# Patient Record
Sex: Female | Born: 1975 | Race: Black or African American | Hispanic: No | Smoking: Never smoker
Health system: Southern US, Community
[De-identification: ages and names within clinical notes are randomized; demographics above are authoritative.]

## PROBLEM LIST (undated history)

## (undated) DIAGNOSIS — D649 Anemia, unspecified: Secondary | ICD-10-CM

## (undated) DIAGNOSIS — Z87410 Personal history of cervical dysplasia: Secondary | ICD-10-CM

## (undated) DIAGNOSIS — E162 Hypoglycemia, unspecified: Secondary | ICD-10-CM

## (undated) DIAGNOSIS — K219 Gastro-esophageal reflux disease without esophagitis: Secondary | ICD-10-CM

## (undated) DIAGNOSIS — F319 Bipolar disorder, unspecified: Secondary | ICD-10-CM

## (undated) DIAGNOSIS — G4700 Insomnia, unspecified: Secondary | ICD-10-CM

## (undated) DIAGNOSIS — F419 Anxiety disorder, unspecified: Secondary | ICD-10-CM

## (undated) DIAGNOSIS — R87629 Unspecified abnormal cytological findings in specimens from vagina: Secondary | ICD-10-CM

## (undated) DIAGNOSIS — N6012 Diffuse cystic mastopathy of left breast: Secondary | ICD-10-CM

## (undated) DIAGNOSIS — F32A Depression, unspecified: Secondary | ICD-10-CM

## (undated) DIAGNOSIS — N6011 Diffuse cystic mastopathy of right breast: Secondary | ICD-10-CM

## (undated) DIAGNOSIS — F329 Major depressive disorder, single episode, unspecified: Secondary | ICD-10-CM

## (undated) DIAGNOSIS — U071 COVID-19: Secondary | ICD-10-CM

## (undated) HISTORY — DX: Anxiety disorder, unspecified: F41.9

## (undated) HISTORY — DX: Personal history of cervical dysplasia: Z87.410

## (undated) HISTORY — PX: AUGMENTATION MAMMAPLASTY: SUR837

## (undated) HISTORY — DX: Depression, unspecified: F32.A

## (undated) HISTORY — DX: Unspecified abnormal cytological findings in specimens from vagina: R87.629

## (undated) HISTORY — PX: BREAST FIBROADENOMA SURGERY: SHX580

## (undated) HISTORY — DX: Major depressive disorder, single episode, unspecified: F32.9

## (undated) HISTORY — DX: Anemia, unspecified: D64.9

---
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
## 1997-10-11 ENCOUNTER — Emergency Department (HOSPITAL_COMMUNITY): Admission: EM | Admit: 1997-10-11 | Discharge: 1997-10-11 | Payer: Self-pay | Admitting: Internal Medicine

## 1997-10-12 ENCOUNTER — Emergency Department (HOSPITAL_COMMUNITY): Admission: EM | Admit: 1997-10-12 | Discharge: 1997-10-12 | Payer: Self-pay | Admitting: Emergency Medicine

## 1997-10-18 ENCOUNTER — Emergency Department (HOSPITAL_COMMUNITY): Admission: EM | Admit: 1997-10-18 | Discharge: 1997-10-18 | Payer: Self-pay | Admitting: Emergency Medicine

## 1998-07-31 ENCOUNTER — Emergency Department (HOSPITAL_COMMUNITY): Admission: EM | Admit: 1998-07-31 | Discharge: 1998-07-31 | Payer: Self-pay

## 1998-08-16 ENCOUNTER — Inpatient Hospital Stay (HOSPITAL_COMMUNITY): Admission: AD | Admit: 1998-08-16 | Discharge: 1998-08-16 | Payer: Self-pay | Admitting: Obstetrics

## 1998-08-16 ENCOUNTER — Encounter: Payer: Self-pay | Admitting: Obstetrics

## 1998-08-31 ENCOUNTER — Encounter: Admission: RE | Admit: 1998-08-31 | Discharge: 1998-08-31 | Payer: Self-pay | Admitting: Obstetrics

## 1998-09-06 ENCOUNTER — Encounter: Payer: Self-pay | Admitting: Obstetrics

## 1998-09-07 ENCOUNTER — Inpatient Hospital Stay (HOSPITAL_COMMUNITY): Admission: AD | Admit: 1998-09-07 | Discharge: 1998-09-08 | Payer: Self-pay | Admitting: Obstetrics

## 1998-09-12 ENCOUNTER — Inpatient Hospital Stay (HOSPITAL_COMMUNITY): Admission: AD | Admit: 1998-09-12 | Discharge: 1998-09-12 | Payer: Self-pay | Admitting: *Deleted

## 1998-09-26 ENCOUNTER — Encounter: Admission: RE | Admit: 1998-09-26 | Discharge: 1998-09-26 | Payer: Self-pay | Admitting: Sports Medicine

## 1998-10-03 ENCOUNTER — Inpatient Hospital Stay (HOSPITAL_COMMUNITY): Admission: AD | Admit: 1998-10-03 | Discharge: 1998-10-03 | Payer: Self-pay | Admitting: *Deleted

## 1998-10-03 ENCOUNTER — Encounter: Payer: Self-pay | Admitting: *Deleted

## 1998-10-09 ENCOUNTER — Encounter: Admission: RE | Admit: 1998-10-09 | Discharge: 1998-10-09 | Payer: Self-pay | Admitting: Family Medicine

## 1998-10-12 ENCOUNTER — Inpatient Hospital Stay (HOSPITAL_COMMUNITY): Admission: AD | Admit: 1998-10-12 | Discharge: 1998-10-12 | Payer: Self-pay | Admitting: Obstetrics

## 1998-10-13 ENCOUNTER — Encounter: Admission: RE | Admit: 1998-10-13 | Discharge: 1998-10-13 | Payer: Self-pay | Admitting: Family Medicine

## 1998-11-02 ENCOUNTER — Inpatient Hospital Stay (HOSPITAL_COMMUNITY): Admission: AD | Admit: 1998-11-02 | Discharge: 1998-11-02 | Payer: Self-pay | Admitting: *Deleted

## 1998-11-07 ENCOUNTER — Observation Stay (HOSPITAL_COMMUNITY): Admission: AD | Admit: 1998-11-07 | Discharge: 1998-11-08 | Payer: Self-pay | Admitting: *Deleted

## 1998-11-13 ENCOUNTER — Encounter: Admission: RE | Admit: 1998-11-13 | Discharge: 1998-11-13 | Payer: Self-pay | Admitting: Family Medicine

## 1998-11-17 ENCOUNTER — Encounter: Admission: RE | Admit: 1998-11-17 | Discharge: 1998-11-17 | Payer: Self-pay | Admitting: Family Medicine

## 1998-11-27 ENCOUNTER — Encounter: Admission: RE | Admit: 1998-11-27 | Discharge: 1998-11-27 | Payer: Self-pay | Admitting: Family Medicine

## 1998-12-20 ENCOUNTER — Encounter: Admission: RE | Admit: 1998-12-20 | Discharge: 1998-12-20 | Payer: Self-pay | Admitting: Family Medicine

## 1998-12-27 ENCOUNTER — Other Ambulatory Visit: Admission: RE | Admit: 1998-12-27 | Discharge: 1998-12-27 | Payer: Self-pay | Admitting: *Deleted

## 1998-12-27 ENCOUNTER — Encounter: Admission: RE | Admit: 1998-12-27 | Discharge: 1998-12-27 | Payer: Self-pay | Admitting: Family Medicine

## 1999-01-26 ENCOUNTER — Encounter: Admission: RE | Admit: 1999-01-26 | Discharge: 1999-01-26 | Payer: Self-pay | Admitting: Family Medicine

## 1999-02-16 ENCOUNTER — Ambulatory Visit (HOSPITAL_COMMUNITY): Admission: RE | Admit: 1999-02-16 | Discharge: 1999-02-16 | Payer: Self-pay | Admitting: Obstetrics

## 1999-02-22 ENCOUNTER — Encounter: Admission: RE | Admit: 1999-02-22 | Discharge: 1999-02-22 | Payer: Self-pay | Admitting: Family Medicine

## 1999-02-26 ENCOUNTER — Encounter: Admission: RE | Admit: 1999-02-26 | Discharge: 1999-02-26 | Payer: Self-pay | Admitting: Family Medicine

## 1999-03-01 ENCOUNTER — Encounter: Admission: RE | Admit: 1999-03-01 | Discharge: 1999-03-01 | Payer: Self-pay | Admitting: Family Medicine

## 1999-03-14 ENCOUNTER — Encounter: Admission: RE | Admit: 1999-03-14 | Discharge: 1999-03-14 | Payer: Self-pay | Admitting: Family Medicine

## 1999-03-20 ENCOUNTER — Inpatient Hospital Stay (HOSPITAL_COMMUNITY): Admission: AD | Admit: 1999-03-20 | Discharge: 1999-03-20 | Payer: Self-pay | Admitting: *Deleted

## 1999-03-27 ENCOUNTER — Encounter: Admission: RE | Admit: 1999-03-27 | Discharge: 1999-03-27 | Payer: Self-pay | Admitting: Family Medicine

## 1999-04-03 ENCOUNTER — Encounter: Admission: RE | Admit: 1999-04-03 | Discharge: 1999-04-03 | Payer: Self-pay | Admitting: Sports Medicine

## 1999-04-13 ENCOUNTER — Encounter: Admission: RE | Admit: 1999-04-13 | Discharge: 1999-04-13 | Payer: Self-pay | Admitting: Sports Medicine

## 1999-04-18 ENCOUNTER — Encounter: Admission: RE | Admit: 1999-04-18 | Discharge: 1999-04-18 | Payer: Self-pay | Admitting: Family Medicine

## 1999-04-21 ENCOUNTER — Inpatient Hospital Stay (HOSPITAL_COMMUNITY): Admission: AD | Admit: 1999-04-21 | Discharge: 1999-04-21 | Payer: Self-pay | Admitting: *Deleted

## 1999-04-23 DIAGNOSIS — E162 Hypoglycemia, unspecified: Secondary | ICD-10-CM

## 1999-04-23 HISTORY — DX: Hypoglycemia, unspecified: E16.2

## 1999-04-26 ENCOUNTER — Inpatient Hospital Stay (HOSPITAL_COMMUNITY): Admission: AD | Admit: 1999-04-26 | Discharge: 1999-04-26 | Payer: Self-pay | Admitting: Obstetrics & Gynecology

## 1999-04-26 ENCOUNTER — Encounter: Payer: Self-pay | Admitting: Obstetrics & Gynecology

## 1999-04-26 ENCOUNTER — Encounter: Admission: RE | Admit: 1999-04-26 | Discharge: 1999-04-26 | Payer: Self-pay | Admitting: Family Medicine

## 1999-04-29 ENCOUNTER — Inpatient Hospital Stay (HOSPITAL_COMMUNITY): Admission: AD | Admit: 1999-04-29 | Discharge: 1999-04-29 | Payer: Self-pay | Admitting: Obstetrics

## 1999-05-02 ENCOUNTER — Inpatient Hospital Stay (HOSPITAL_COMMUNITY): Admission: AD | Admit: 1999-05-02 | Discharge: 1999-05-02 | Payer: Self-pay | Admitting: Obstetrics

## 1999-05-03 ENCOUNTER — Encounter: Admission: RE | Admit: 1999-05-03 | Discharge: 1999-05-03 | Payer: Self-pay | Admitting: Family Medicine

## 1999-05-05 ENCOUNTER — Encounter (INDEPENDENT_AMBULATORY_CARE_PROVIDER_SITE_OTHER): Payer: Self-pay | Admitting: Specialist

## 1999-05-05 ENCOUNTER — Encounter: Payer: Self-pay | Admitting: *Deleted

## 1999-05-05 ENCOUNTER — Inpatient Hospital Stay (HOSPITAL_COMMUNITY): Admission: AD | Admit: 1999-05-05 | Discharge: 1999-05-07 | Payer: Self-pay | Admitting: *Deleted

## 1999-05-07 ENCOUNTER — Encounter: Admission: RE | Admit: 1999-05-07 | Discharge: 1999-05-07 | Payer: Self-pay | Admitting: Family Medicine

## 1999-05-08 ENCOUNTER — Encounter: Admission: RE | Admit: 1999-05-08 | Discharge: 1999-08-06 | Payer: Self-pay | Admitting: *Deleted

## 1999-06-13 ENCOUNTER — Encounter: Admission: RE | Admit: 1999-06-13 | Discharge: 1999-06-13 | Payer: Self-pay | Admitting: Family Medicine

## 1999-08-03 ENCOUNTER — Encounter: Admission: RE | Admit: 1999-08-03 | Discharge: 1999-11-01 | Payer: Self-pay | Admitting: *Deleted

## 1999-08-29 ENCOUNTER — Encounter: Admission: RE | Admit: 1999-08-29 | Discharge: 1999-08-29 | Payer: Self-pay | Admitting: Family Medicine

## 1999-12-27 ENCOUNTER — Encounter: Admission: RE | Admit: 1999-12-27 | Discharge: 1999-12-27 | Payer: Self-pay | Admitting: Family Medicine

## 2000-03-11 ENCOUNTER — Encounter: Admission: RE | Admit: 2000-03-11 | Discharge: 2000-03-11 | Payer: Self-pay | Admitting: Sports Medicine

## 2000-05-13 ENCOUNTER — Encounter: Admission: RE | Admit: 2000-05-13 | Discharge: 2000-05-13 | Payer: Self-pay | Admitting: Family Medicine

## 2000-05-13 ENCOUNTER — Other Ambulatory Visit: Admission: RE | Admit: 2000-05-13 | Discharge: 2000-05-13 | Payer: Self-pay | Admitting: Family Medicine

## 2000-05-19 ENCOUNTER — Encounter: Admission: RE | Admit: 2000-05-19 | Discharge: 2000-05-19 | Payer: Self-pay | Admitting: Family Medicine

## 2000-07-16 ENCOUNTER — Encounter: Admission: RE | Admit: 2000-07-16 | Discharge: 2000-07-16 | Payer: Self-pay | Admitting: Family Medicine

## 2000-08-08 ENCOUNTER — Encounter: Admission: RE | Admit: 2000-08-08 | Discharge: 2000-08-08 | Payer: Self-pay | Admitting: Family Medicine

## 2000-08-15 ENCOUNTER — Encounter: Admission: RE | Admit: 2000-08-15 | Discharge: 2000-08-15 | Payer: Self-pay | Admitting: Family Medicine

## 2001-01-01 ENCOUNTER — Emergency Department (HOSPITAL_COMMUNITY): Admission: EM | Admit: 2001-01-01 | Discharge: 2001-01-01 | Payer: Self-pay | Admitting: Emergency Medicine

## 2001-01-01 ENCOUNTER — Encounter: Payer: Self-pay | Admitting: Emergency Medicine

## 2001-04-04 ENCOUNTER — Emergency Department (HOSPITAL_COMMUNITY): Admission: EM | Admit: 2001-04-04 | Discharge: 2001-04-04 | Payer: Self-pay | Admitting: Emergency Medicine

## 2001-04-04 ENCOUNTER — Encounter: Payer: Self-pay | Admitting: Emergency Medicine

## 2001-04-22 HISTORY — PX: TUBAL LIGATION: SHX77

## 2001-04-29 ENCOUNTER — Encounter: Admission: RE | Admit: 2001-04-29 | Discharge: 2001-04-29 | Payer: Self-pay | Admitting: Family Medicine

## 2001-06-11 ENCOUNTER — Other Ambulatory Visit: Admission: RE | Admit: 2001-06-11 | Discharge: 2001-06-11 | Payer: Self-pay | Admitting: Family Medicine

## 2001-06-11 ENCOUNTER — Encounter: Admission: RE | Admit: 2001-06-11 | Discharge: 2001-06-11 | Payer: Self-pay | Admitting: Family Medicine

## 2001-06-16 ENCOUNTER — Encounter: Admission: RE | Admit: 2001-06-16 | Discharge: 2001-06-16 | Payer: Self-pay | Admitting: Sports Medicine

## 2001-06-16 ENCOUNTER — Encounter: Payer: Self-pay | Admitting: Sports Medicine

## 2001-07-28 ENCOUNTER — Encounter: Admission: RE | Admit: 2001-07-28 | Discharge: 2001-07-28 | Payer: Self-pay | Admitting: Family Medicine

## 2001-08-25 ENCOUNTER — Encounter: Admission: RE | Admit: 2001-08-25 | Discharge: 2001-08-25 | Payer: Self-pay | Admitting: Family Medicine

## 2001-09-28 ENCOUNTER — Inpatient Hospital Stay (HOSPITAL_COMMUNITY): Admission: AD | Admit: 2001-09-28 | Discharge: 2001-09-28 | Payer: Self-pay | Admitting: Obstetrics and Gynecology

## 2001-11-19 ENCOUNTER — Encounter: Admission: RE | Admit: 2001-11-19 | Discharge: 2001-11-19 | Payer: Self-pay | Admitting: Family Medicine

## 2002-06-14 ENCOUNTER — Inpatient Hospital Stay (HOSPITAL_COMMUNITY): Admission: AD | Admit: 2002-06-14 | Discharge: 2002-06-14 | Payer: Self-pay | Admitting: Obstetrics and Gynecology

## 2002-08-13 ENCOUNTER — Other Ambulatory Visit: Admission: RE | Admit: 2002-08-13 | Discharge: 2002-08-13 | Payer: Self-pay | Admitting: Family Medicine

## 2002-08-13 ENCOUNTER — Encounter: Admission: RE | Admit: 2002-08-13 | Discharge: 2002-08-13 | Payer: Self-pay | Admitting: Family Medicine

## 2002-09-10 ENCOUNTER — Encounter: Admission: RE | Admit: 2002-09-10 | Discharge: 2002-09-10 | Payer: Self-pay | Admitting: Family Medicine

## 2002-12-23 ENCOUNTER — Encounter: Payer: Self-pay | Admitting: Emergency Medicine

## 2002-12-23 ENCOUNTER — Emergency Department (HOSPITAL_COMMUNITY): Admission: EM | Admit: 2002-12-23 | Discharge: 2002-12-24 | Payer: Self-pay | Admitting: *Deleted

## 2003-01-21 ENCOUNTER — Encounter: Admission: RE | Admit: 2003-01-21 | Discharge: 2003-01-21 | Payer: Self-pay | Admitting: Sports Medicine

## 2003-01-21 ENCOUNTER — Ambulatory Visit (HOSPITAL_COMMUNITY): Admission: RE | Admit: 2003-01-21 | Discharge: 2003-01-21 | Payer: Self-pay | Admitting: Sports Medicine

## 2003-02-01 ENCOUNTER — Encounter: Admission: RE | Admit: 2003-02-01 | Discharge: 2003-02-01 | Payer: Self-pay | Admitting: Family Medicine

## 2003-02-07 ENCOUNTER — Encounter: Admission: RE | Admit: 2003-02-07 | Discharge: 2003-02-07 | Payer: Self-pay | Admitting: Family Medicine

## 2003-10-25 ENCOUNTER — Encounter (INDEPENDENT_AMBULATORY_CARE_PROVIDER_SITE_OTHER): Payer: Self-pay | Admitting: *Deleted

## 2003-10-25 LAB — CONVERTED CEMR LAB

## 2003-11-04 ENCOUNTER — Other Ambulatory Visit: Admission: RE | Admit: 2003-11-04 | Discharge: 2003-11-04 | Payer: Self-pay | Admitting: Family Medicine

## 2003-11-04 ENCOUNTER — Encounter: Admission: RE | Admit: 2003-11-04 | Discharge: 2003-11-04 | Payer: Self-pay | Admitting: Family Medicine

## 2003-11-08 ENCOUNTER — Encounter: Admission: RE | Admit: 2003-11-08 | Discharge: 2003-11-08 | Payer: Self-pay | Admitting: Sports Medicine

## 2003-12-05 ENCOUNTER — Encounter: Admission: RE | Admit: 2003-12-05 | Discharge: 2003-12-05 | Payer: Self-pay | Admitting: Sports Medicine

## 2004-05-07 ENCOUNTER — Ambulatory Visit: Payer: Self-pay | Admitting: Family Medicine

## 2004-08-07 ENCOUNTER — Ambulatory Visit: Payer: Self-pay | Admitting: Family Medicine

## 2004-09-18 ENCOUNTER — Emergency Department (HOSPITAL_COMMUNITY): Admission: EM | Admit: 2004-09-18 | Discharge: 2004-09-18 | Payer: Self-pay | Admitting: Family Medicine

## 2005-01-21 ENCOUNTER — Emergency Department (HOSPITAL_COMMUNITY): Admission: EM | Admit: 2005-01-21 | Discharge: 2005-01-21 | Payer: Self-pay | Admitting: Family Medicine

## 2005-05-24 ENCOUNTER — Ambulatory Visit: Payer: Self-pay | Admitting: Family Medicine

## 2005-06-18 ENCOUNTER — Ambulatory Visit: Payer: Self-pay | Admitting: Family Medicine

## 2005-06-18 ENCOUNTER — Other Ambulatory Visit: Admission: RE | Admit: 2005-06-18 | Discharge: 2005-06-18 | Payer: Self-pay | Admitting: Family Medicine

## 2005-08-27 ENCOUNTER — Emergency Department (HOSPITAL_COMMUNITY): Admission: EM | Admit: 2005-08-27 | Discharge: 2005-08-28 | Payer: Self-pay | Admitting: Emergency Medicine

## 2005-09-04 ENCOUNTER — Encounter: Admission: RE | Admit: 2005-09-04 | Discharge: 2005-09-20 | Payer: Self-pay | Admitting: Specialist

## 2006-06-20 ENCOUNTER — Encounter (INDEPENDENT_AMBULATORY_CARE_PROVIDER_SITE_OTHER): Payer: Self-pay | Admitting: *Deleted

## 2006-12-08 ENCOUNTER — Emergency Department (HOSPITAL_COMMUNITY): Admission: EM | Admit: 2006-12-08 | Discharge: 2006-12-08 | Payer: Self-pay | Admitting: Family Medicine

## 2007-10-07 ENCOUNTER — Ambulatory Visit: Payer: Self-pay | Admitting: Family Medicine

## 2007-10-07 ENCOUNTER — Encounter (INDEPENDENT_AMBULATORY_CARE_PROVIDER_SITE_OTHER): Payer: Self-pay | Admitting: Family Medicine

## 2007-10-07 DIAGNOSIS — Z862 Personal history of diseases of the blood and blood-forming organs and certain disorders involving the immune mechanism: Secondary | ICD-10-CM | POA: Insufficient documentation

## 2007-10-07 DIAGNOSIS — E162 Hypoglycemia, unspecified: Secondary | ICD-10-CM | POA: Insufficient documentation

## 2007-10-08 ENCOUNTER — Encounter (INDEPENDENT_AMBULATORY_CARE_PROVIDER_SITE_OTHER): Payer: Self-pay | Admitting: Family Medicine

## 2007-10-08 DIAGNOSIS — E079 Disorder of thyroid, unspecified: Secondary | ICD-10-CM | POA: Insufficient documentation

## 2007-10-08 LAB — CONVERTED CEMR LAB
Albumin: 5.1 g/dL (ref 3.5–5.2)
Alkaline Phosphatase: 40 units/L (ref 39–117)
CO2: 22 meq/L (ref 19–32)
Chlamydia, DNA Probe: NEGATIVE
Chloride: 103 meq/L (ref 96–112)
GC Probe Amp, Genital: NEGATIVE
Glucose, Bld: 71 mg/dL (ref 70–99)
HCV Ab: NEGATIVE
LDL Cholesterol: 109 mg/dL — ABNORMAL HIGH (ref 0–99)
MCHC: 32 g/dL (ref 30.0–36.0)
Potassium: 3.7 meq/L (ref 3.5–5.3)
RBC: 4.62 M/uL (ref 3.87–5.11)
Sodium: 140 meq/L (ref 135–145)
T3, Free: 2.8 pg/mL (ref 2.3–4.2)
Total Protein: 8.2 g/dL (ref 6.0–8.3)
Triglycerides: 65 mg/dL (ref <150)
WBC: 4.9 10*3/uL (ref 4.0–10.5)

## 2008-01-24 ENCOUNTER — Emergency Department (HOSPITAL_COMMUNITY): Admission: EM | Admit: 2008-01-24 | Discharge: 2008-01-24 | Payer: Self-pay | Admitting: Family Medicine

## 2008-01-26 ENCOUNTER — Ambulatory Visit: Payer: Self-pay | Admitting: Family Medicine

## 2008-01-26 ENCOUNTER — Encounter (INDEPENDENT_AMBULATORY_CARE_PROVIDER_SITE_OTHER): Payer: Self-pay | Admitting: Family Medicine

## 2008-03-29 ENCOUNTER — Ambulatory Visit: Payer: Self-pay | Admitting: Family Medicine

## 2008-03-29 ENCOUNTER — Telehealth: Payer: Self-pay | Admitting: *Deleted

## 2008-03-31 ENCOUNTER — Encounter (INDEPENDENT_AMBULATORY_CARE_PROVIDER_SITE_OTHER): Payer: Self-pay | Admitting: Family Medicine

## 2008-03-31 ENCOUNTER — Telehealth: Payer: Self-pay | Admitting: Family Medicine

## 2008-03-31 ENCOUNTER — Ambulatory Visit: Payer: Self-pay | Admitting: Family Medicine

## 2008-04-04 ENCOUNTER — Emergency Department (HOSPITAL_COMMUNITY): Admission: EM | Admit: 2008-04-04 | Discharge: 2008-04-04 | Payer: Self-pay | Admitting: Emergency Medicine

## 2008-04-04 ENCOUNTER — Telehealth: Payer: Self-pay | Admitting: *Deleted

## 2008-04-05 ENCOUNTER — Ambulatory Visit: Payer: Self-pay | Admitting: Family Medicine

## 2008-06-30 ENCOUNTER — Ambulatory Visit: Payer: Self-pay | Admitting: Family Medicine

## 2008-06-30 DIAGNOSIS — N6320 Unspecified lump in the left breast, unspecified quadrant: Secondary | ICD-10-CM

## 2008-06-30 DIAGNOSIS — N63 Unspecified lump in unspecified breast: Secondary | ICD-10-CM

## 2008-06-30 HISTORY — DX: Unspecified lump in the left breast, unspecified quadrant: N63.20

## 2008-07-05 ENCOUNTER — Encounter (INDEPENDENT_AMBULATORY_CARE_PROVIDER_SITE_OTHER): Payer: Self-pay | Admitting: Family Medicine

## 2008-07-05 ENCOUNTER — Encounter: Admission: RE | Admit: 2008-07-05 | Discharge: 2008-07-05 | Payer: Self-pay | Admitting: Family Medicine

## 2008-07-18 ENCOUNTER — Ambulatory Visit: Payer: Self-pay | Admitting: Family Medicine

## 2008-07-18 DIAGNOSIS — F319 Bipolar disorder, unspecified: Secondary | ICD-10-CM | POA: Insufficient documentation

## 2008-07-18 DIAGNOSIS — F39 Unspecified mood [affective] disorder: Secondary | ICD-10-CM

## 2008-07-18 DIAGNOSIS — F3160 Bipolar disorder, current episode mixed, unspecified: Secondary | ICD-10-CM | POA: Insufficient documentation

## 2008-07-20 ENCOUNTER — Ambulatory Visit (HOSPITAL_BASED_OUTPATIENT_CLINIC_OR_DEPARTMENT_OTHER): Admission: RE | Admit: 2008-07-20 | Discharge: 2008-07-20 | Payer: Self-pay | Admitting: General Surgery

## 2008-07-20 ENCOUNTER — Encounter (INDEPENDENT_AMBULATORY_CARE_PROVIDER_SITE_OTHER): Payer: Self-pay | Admitting: General Surgery

## 2008-10-10 ENCOUNTER — Telehealth: Payer: Self-pay | Admitting: Family Medicine

## 2009-02-20 ENCOUNTER — Inpatient Hospital Stay (HOSPITAL_COMMUNITY): Admission: AD | Admit: 2009-02-20 | Discharge: 2009-02-21 | Payer: Self-pay | Admitting: Obstetrics and Gynecology

## 2009-02-23 ENCOUNTER — Ambulatory Visit: Payer: Self-pay | Admitting: Family Medicine

## 2009-02-23 ENCOUNTER — Encounter: Payer: Self-pay | Admitting: Family Medicine

## 2009-02-23 LAB — CONVERTED CEMR LAB
Hemoglobin: 12.2 g/dL (ref 12.0–15.0)
MCHC: 32.3 g/dL (ref 30.0–36.0)
MCV: 90 fL (ref 78.0–100.0)
RBC: 4.2 M/uL (ref 3.87–5.11)
TSH: 0.316 microintl units/mL — ABNORMAL LOW (ref 0.350–4.500)

## 2009-02-26 ENCOUNTER — Encounter: Payer: Self-pay | Admitting: Family Medicine

## 2009-12-27 ENCOUNTER — Encounter: Payer: Self-pay | Admitting: Sports Medicine

## 2009-12-27 ENCOUNTER — Ambulatory Visit: Payer: Self-pay | Admitting: Family Medicine

## 2009-12-28 ENCOUNTER — Encounter: Admission: RE | Admit: 2009-12-28 | Discharge: 2009-12-28 | Payer: Self-pay | Admitting: Sports Medicine

## 2010-01-02 ENCOUNTER — Ambulatory Visit: Payer: Self-pay | Admitting: Family Medicine

## 2010-05-16 ENCOUNTER — Ambulatory Visit
Admission: RE | Admit: 2010-05-16 | Discharge: 2010-05-16 | Payer: Self-pay | Source: Home / Self Care | Attending: Family Medicine | Admitting: Family Medicine
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
## 2010-05-22 NOTE — Assessment & Plan Note (Signed)
Summary: lump in breast,tcb   Vital Signs:  Patient profile:   35 year old female Height:      64.8 inches Weight:      110 pounds BMI:     18.48 Temp:     98.3 degrees F oral Pulse rate:   60 / minute BP sitting:   93 / 68  (left arm) Cuff size:   regular  Vitals Entered By: Jimmy Footman, CMA (December 27, 2009 10:06 AM) CC: lump in rt breast that has been increasing in size Is Patient Diabetic? No Pain Assessment Patient in pain? yes     Location: left side Intensity: 10 Type: sharp   Primary Care Provider:  Delbert Harness MD  CC:  lump in rt breast that has been increasing in size.  History of Present Illness: 35 yo female s/p lumpectomy march 2010, path: fibroadenoma.  Now with new lump in R breast, painful, no changes with periods, no fevers/chills, no breast discharge, present 2 months.  Taking motrin 1600 mg a day and only helping a little, icing it.  Unable to sleep becuase of the pain.  She realizes this may be another fibroadenoma and request a mastectomy (tubes tied, no plans for further children)  Habits & Providers  Alcohol-Tobacco-Diet     Tobacco Status: never  Current Medications (verified): 1)  Tramadol Hcl 50 Mg Tabs (Tramadol Hcl) .... One Tab By Mouth Q8h As Needed For Pain 2)  Hydroxyzine Hcl 25 Mg Tabs (Hydroxyzine Hcl) .... One Tab By Mouth Qhs For Insomnia  Allergies (verified): 1)  ! Oxycodone Hcl  Review of Systems       See HPI  Physical Exam  General:  Well-developed,well-nourished,in no acute distress; alert,appropriate and cooperative throughout examination Breasts:  No mass, nodules, thickening, tenderness, bulging, retraction, inflamation, nipple discharge or skin changes noted on LEFT breast.  Right breast: No discharge, skin changes, axillary masses.  She does have a 1cm mobile, hard, well defined lump in the lower outer quadrant at about 8 oclock.  TTP.   Impression & Recommendations:  Problem # 1:  LUMP OR MASS IN BREAST  (ICD-611.72) Assessment New Likely another fibroadenoma. US breast Will likely need tissue dx again. Will await [name] results and refer as appropriate back to Dr. Donell Beers who did her first lumpectomy. Toradol IM for pain in office, immediate improvement in pain. Tramadol for pain at home. Vistaril to help sleep. Advised mastectomy too aggressive at this point.  Orders: [facility]- Est  Level 4 (16109) Ketorolac-Toradol 15mg  (U0454) Admin of Therapeutic Inj  intramuscular or subcutaneous (09811) Ultrasound (Ultrasound)  Complete Medication List: 1)  Tramadol Hcl 50 Mg Tabs (Tramadol hcl) .... One tab by mouth q8h as needed for pain 2)  Hydroxyzine Hcl 25 Mg Tabs (Hydroxyzine hcl) .... One tab by mouth qhs for insomnia  Patient Instructions: 1)  Great to meet you, 2)  Need another ultrasound for your breast mass. 3)  New pain medicine: Tramadol. 4)  Hydroxyzine for sleep. 5)  Come back to see me after your ultrasound. 6)  -Dr. Karie Schwalbe. Prescriptions: HYDROXYZINE HCL 25 MG TABS (HYDROXYZINE HCL) One tab by mouth qHS for insomnia  #30 x 3   Entered and Authorized by:   Rodney Langton MD   Signed by:   Rodney Langton MD on 12/27/2009   Method used:   [facility]ron[facility] to        [facility] [address] [phone]* (retail)       [address]       [place]       [place], [place]  [address]       Ph: [phone] or [phone]       Fax: [phone]   RxID:   [id number] TRAMADOL HCL 50 MG TABS (TRAMADOL HCL) One tab by mouth q8h as needed for pain  #30 x 3   Entered and Authorized by:   Rodney Langton MD   Signed by:   Rodney Langton MD on 12/27/2009   Method used:   [facility]        [facility] [address] [phone]* (retail)       [address]       [place], [place]  [address]       Ph: [phone] or [phone]       Fax: [phone]   RxID:   [id number]    Medication Administration  Injection # 1:    Medication:  Ketorolac-Toradol 15mg     Diagnosis: LUMP OR MASS IN BREAST (ICD-611.72)    Route: IM    Site: L deltoid    Exp Date: 02/21/2011    Lot #: [id number]    Mfr: hospira    Patient tolerated injection without complications    Given by: Jimmy Footman, CMA (December 27, 2009 11:11 AM)  Orders Added: 1)  [facility]- Est  Level 4 [01751] 2)  Ketorolac-Toradol 15mg  [J1885] 3)  Admin of Therapeutic Inj  intramuscular or subcutaneous [96372] 4)  Ultrasound [Ultrasound]

## 2010-05-22 NOTE — Letter (Signed)
Summary: Out of Work  [facility]  [address]   [place], [place] [address]   Phone: [phone]  Fax: [phone]    December 27, 2009   Employee:  [employer]    To Whom It May Concern:   For Medical reasons, please excuse the above named employee from work for the following dates:  Start:   12/27/09  End:   12/29/09  If you need additional information, please feel free to contact our office.         Sincerely,    Rodney Langton MD
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
## 2010-06-06 ENCOUNTER — Encounter: Payer: Self-pay | Admitting: *Deleted

## 2010-07-03 ENCOUNTER — Emergency Department (HOSPITAL_BASED_OUTPATIENT_CLINIC_OR_DEPARTMENT_OTHER)
Admission: EM | Admit: 2010-07-03 | Discharge: 2010-07-03 | Disposition: A | Discharge status: Home / Self Care | Payer: Worker's Compensation | Attending: Emergency Medicine | Admitting: Emergency Medicine

## 2010-07-03 ENCOUNTER — Emergency Department (INDEPENDENT_AMBULATORY_CARE_PROVIDER_SITE_OTHER): Payer: Worker's Compensation

## 2010-07-03 DIAGNOSIS — S63519A Sprain of carpal joint of unspecified wrist, initial encounter: Secondary | ICD-10-CM

## 2010-07-03 DIAGNOSIS — X58XXXA Exposure to other specified factors, initial encounter: Secondary | ICD-10-CM

## 2010-07-03 DIAGNOSIS — Y9289 Other specified places as the place of occurrence of the external cause: Secondary | ICD-10-CM | POA: Insufficient documentation

## 2010-07-03 DIAGNOSIS — S63509A Unspecified sprain of unspecified wrist, initial encounter: Secondary | ICD-10-CM | POA: Insufficient documentation

## 2010-07-03 DIAGNOSIS — Y9269 Other specified industrial and construction area as the place of occurrence of the external cause: Secondary | ICD-10-CM

## 2010-07-25 LAB — URINE MICROSCOPIC-ADD ON

## 2010-07-25 LAB — URINALYSIS, ROUTINE W REFLEX MICROSCOPIC
Glucose, UA: NEGATIVE mg/dL
Leukocytes, UA: NEGATIVE
Specific Gravity, Urine: <1.005 — ABNORMAL LOW (ref 1.005–1.030)
pH: 7 (ref 5.0–8.0)

## 2010-07-25 LAB — GC/CHLAMYDIA PROBE AMP, GENITAL: Chlamydia, DNA Probe: NEGATIVE

## 2010-08-02 LAB — POCT HEMOGLOBIN-HEMACUE: Hemoglobin: 12.7 g/dL (ref 12.0–15.0)

## 2010-09-04 NOTE — Op Note (Signed)
Bethany [name], Bethany [name]              ACCOUNT NO.:  [id number]   MEDICAL RECORD NO.:  [id number]          PATIENT TYPE:  AMB   LOCATION:  DSC                          FACILITY:  [facility]   PHYSICIAN:  Almond Lint, MD       DATE OF BIRTH:  1976/04/16   DATE OF PROCEDURE:  DATE OF DISCHARGE:                               OPERATIVE REPORT   PREOPERATIVE DIAGNOSIS:  Right breast mass.   POSTOPERATIVE DIAGNOSIS:  Right breast mass.   PROCEDURE PERFORMED:  Excision of right breast mass.   SURGEON:  Almond Lint, MD   ASSISTANT:  None.   ANESTHESIA:  General and local.   FINDINGS:  Well circumscribed breast mass, approximately 1.5 cm in  diameter at the 8 o'clock position on the right breast.   SPECIMENS:  Right breast mass to pathology.   ESTIMATED BLOOD LOSS:  Minimal.   COMPLICATIONS:  None known.   PROCEDURE:  [name] was identified in the holding area and taken to  the operating room where she was placed supine on the operating room  table.  General anesthesia was induced.  Her right breast was prepped  and draped in the sterile fashion.  Time-out was performed according to  the surgical safety checklist.  When all was correct, we continued.  A  circumareolar incision was made with a #15 blade.  The subcutaneous  tissue was dissected first with the Bovie.  The skin was elevated with  skin hooks.  The mass was elevated above the wound with the assistance  of an Allis clamp on the tissues just around the breast mass.  The  breast mass was removed with the Bovie and it was very rubbery and well  circumscribed and had the appearance of a fibroadenoma.  The patient was  small breasted, so additional tissue was not removed.  The specimen was  marked with the long stitch anterior and a short stitch laterally.  The  specimen was passed off and the cavity was inspected for hemostasis.  The cavity as well as the skin were injected with local anesthesia.  The  cavity was irrigated and  a few small areas of oozing were coagulated  with the Bovie.  The wound was irrigated again and found to be without  any bleeding.  The skin was reapproximated  using 3-0 Vicryl deep dermal interrupted sutures and then closed with a  4-0 running subcuticular Monocryl.  The skin was then cleaned and dried,  and dressed with Steri-Strips, gauze, and paper tape.  The patient was  awakened from anesthesia and taken to the PACU in stable condition.      Almond Lint, MD  Electronically Signed     FB/MEDQ  D:  07/20/2008  T:  07/21/2008  Job:  [id number]

## 2010-09-06 ENCOUNTER — Encounter: Payer: Self-pay | Admitting: Family Medicine

## 2010-09-07 NOTE — Op Note (Signed)
[facility]  Patient:    Bethany [name]                    MRN: [id number] Proc. Date: 05/06/99 Adm. Date:  [id number] Attending:  Michaelle Copas CC:         Charles A. Clearance Coots, M.D.                           Operative Report  PREOPERATIVE DIAGNOSIS:       Desires sterilization.  POSTOPERATIVE DIAGNOSIS:      Desires sterilization.  PROCEDURE:                    Bilateral partial salpingectomy (Pomeroy technique).  SURGEON:                      Charles A. Clearance Coots, M.D.  ANESTHESIA:                   General.  ESTIMATED BLOOD LOSS:         Negligible.  COMPLICATIONS:                None.  SPECIMENS:                    Approximately 2.0 cm segments of right and left fallopian tubes.  DESCRIPTION OF PROCEDURE:     The patient is brought to the operating room after satisfactory general endotracheal anesthesia.  The abdomen was prepped and draped in the usual sterile fashion.  A small inferior umbilical incision was made with the scalpel, that was deepened down to the fascia with curved Mayo scissors. The fascia was grasped in the midline with Kelly forceps, and was cut in between the forceps transversely with curved Mayo scissors.  The incision was extended to the left and to the right with the curved Mayo scissors. The peritoneum was entered  with the same incision, and the right angle retractors were placed in the incision. The left fallopian tube was identified and was grasped with the Babcock clamp. The tube was followed from the corneal end to the fimbrial end serially and grasped  with Babcock clamps.  The tube was then followed in a retrograde fashion back to the isthmic area of the tube and the Babcock clamps, and a knuckle of tube beneath the Babcock clamp and the isthmic area of the tube was doubly ligated with #1 plain catgut, and the section of tube above the knots was excised with Metzenbaum scissors, and  submitted to pathology for evaluation.  There was no active bleeding from the tubal stumps, and they were therefore placed back in their normal anatomic position.  The same procedure was performed on the opposite side without complication.  The abdomen was then closed as follows:  The peritoneum and fascia were closed as one with a continuous suture of #2-0 Vicryl.  The skin was closed with a continuous subcuticular suture of #4-0 monocryl.  A sterile bandage was applied to the incision closure.  The patient tolerated the procedure well.  The surgical technician indicated that all needle, sponge, and instrument counts were correct.  The patient was then transferred to the recovery room in satisfactory condition. DD:  05/06/99 TD:  05/06/99 Job: [id number]

## 2010-09-19 ENCOUNTER — Encounter: Payer: Self-pay | Admitting: Family Medicine

## 2010-09-19 ENCOUNTER — Ambulatory Visit (INDEPENDENT_AMBULATORY_CARE_PROVIDER_SITE_OTHER): Payer: PRIVATE HEALTH INSURANCE | Admitting: Family Medicine

## 2010-09-19 DIAGNOSIS — Z9013 Acquired absence of bilateral breasts and nipples: Secondary | ICD-10-CM | POA: Insufficient documentation

## 2010-09-19 DIAGNOSIS — E079 Disorder of thyroid, unspecified: Secondary | ICD-10-CM

## 2010-09-19 DIAGNOSIS — N644 Mastodynia: Secondary | ICD-10-CM | POA: Insufficient documentation

## 2010-09-19 DIAGNOSIS — Z862 Personal history of diseases of the blood and blood-forming organs and certain disorders involving the immune mechanism: Secondary | ICD-10-CM

## 2010-09-19 DIAGNOSIS — F39 Unspecified mood [affective] disorder: Secondary | ICD-10-CM

## 2010-09-19 DIAGNOSIS — N649 Disorder of breast, unspecified: Secondary | ICD-10-CM | POA: Insufficient documentation

## 2010-09-19 DIAGNOSIS — N63 Unspecified lump in unspecified breast: Secondary | ICD-10-CM

## 2010-09-19 DIAGNOSIS — E162 Hypoglycemia, unspecified: Secondary | ICD-10-CM

## 2010-09-19 LAB — CBC
Hemoglobin: 12.5 g/dL (ref 12.0–15.0)
MCHC: 32.1 g/dL (ref 30.0–36.0)
Platelets: 237 10*3/uL (ref 150–400)
RBC: 4.38 MIL/uL (ref 3.87–5.11)

## 2010-09-19 LAB — COMPREHENSIVE METABOLIC PANEL
ALT: 9 U/L (ref 0–35)
AST: 15 U/L (ref 0–37)
Alkaline Phosphatase: 36 U/L — ABNORMAL LOW (ref 39–117)
Calcium: 9.7 mg/dL (ref 8.4–10.5)
Chloride: 104 mEq/L (ref 96–112)
Creat: 0.84 mg/dL (ref 0.40–1.20)
Potassium: 3.9 mEq/L (ref 3.5–5.3)

## 2010-09-19 LAB — POCT GLYCOSYLATED HEMOGLOBIN (HGB A1C): Hemoglobin A1C: 5.4

## 2010-09-19 LAB — TSH: TSH: 0.49 u[IU]/mL (ref 0.350–4.500)

## 2010-09-19 NOTE — Assessment & Plan Note (Signed)
Non-cyclical, chronic breast pain.  Normal workup 9.2011.  Advised avoiding caffeine, massage as this seems to help.

## 2010-09-19 NOTE — Assessment & Plan Note (Signed)
Will check a1c and encouraged patient to reduce focus on checking blood sugars and focus on eating regular healthy foods with protein.  Asked her to keep log of when she feels jittery, what her blood sugar is and what may have triggered it.  Asked her to focus on not only food, but mood for the day.

## 2010-09-19 NOTE — Assessment & Plan Note (Signed)
>>  ASSESSMENT AND PLAN FOR LUMP OR MASS IN BREAST WRITTEN ON 09/19/2010  9:59 AM BY BRISCOE, Sharrie Rothman, MD  Non-cyclical, chronic breast pain.  Normal workup 9.2011.  Advised avoiding caffeine, massage as this seems to help.

## 2010-09-19 NOTE — Progress Notes (Signed)
  Subjective:    Patient ID: Bethany [name], female    DOB: 01-Jan-1976, 35 y.o.   MRN: [id number]  HPI breast pain: 3 years in duration right breast.  Has undergone mammogram, ultrasound, and elective cyst removal of right breat in 12/2009.  Prior to this had lumpectomy for fibroadenoma.  Patient concerned about continued soreness throughout right breast, noncyclical.  No radiation, aggravating factor iddentified.  No skin changes, nipple discharge.  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   Stress:  Notes recent honeymoon last year.  Denies any stress in her life, difficulty coping at work or at home.      Review of Systems neg excetp as per hpi     Objective:   Physical Exam  Constitutional: Vital signs are normal.       Thin, flat affect  Cardiovascular: Normal rate and regular rhythm.   No murmur heard. Pulmonary/Chest: Effort normal and breath sounds normal. No respiratory distress.  Genitourinary: There is breast tenderness (throughout entire right breast ). No breast swelling, discharge or bleeding.       No mass.  Psychiatric: Her speech is normal. Her mood appears anxious. She is withdrawn.          Assessment & Plan:

## 2010-09-19 NOTE — Patient Instructions (Addendum)
Will check your CMET, CBC, TSH, A1c today. You are due for your physical after November  I don't think you need to check your blood sugar regularly.   Focus on eating regularly- every few hours Eat foods with protein and some fat to help regular your blood sugar- peanut butter, meats, cheeses, beans, eggs I think you should consider speaking with a psychologist about the anxiety in your life and learn some techniques to connect with yoru body such as biofeedback.  Please let me know if I can help you with this.  Dietary changes help to prevent or reduce the symptoms of fibrocystic breast changes. You may need to stop consuming all foods that contain caffeine, such as chocolate, sodas, coffee, and tea. Reducing sugar and fat in your diet may also help.

## 2010-09-19 NOTE — Assessment & Plan Note (Addendum)
History of borderline low TSH with normal free t4.  May be whys he feels jittery.  Will check TSh and free t4 today.

## 2010-09-19 NOTE — Assessment & Plan Note (Signed)
Will check cbc today

## 2010-09-21 ENCOUNTER — Encounter: Payer: Self-pay | Admitting: Family Medicine

## 2010-11-07 ENCOUNTER — Encounter: Payer: Self-pay | Admitting: Family Medicine

## 2010-11-07 ENCOUNTER — Ambulatory Visit (INDEPENDENT_AMBULATORY_CARE_PROVIDER_SITE_OTHER): Payer: PRIVATE HEALTH INSURANCE | Admitting: Family Medicine

## 2010-11-07 VITALS — BP 100/71 | HR 62 | Temp 98.1°F | Ht 65.0 in | Wt 107.4 lb

## 2010-11-07 DIAGNOSIS — R42 Dizziness and giddiness: Secondary | ICD-10-CM | POA: Insufficient documentation

## 2010-11-07 LAB — COMPREHENSIVE METABOLIC PANEL
ALT: <8 U/L (ref 0–35)
BUN: 8 mg/dL (ref 6–23)
CO2: 22 mEq/L (ref 19–32)
Calcium: 9.4 mg/dL (ref 8.4–10.5)
Chloride: 104 mEq/L (ref 96–112)
Creat: 0.87 mg/dL (ref 0.50–1.10)
Glucose, Bld: 77 mg/dL (ref 70–99)
Total Bilirubin: 0.4 mg/dL (ref 0.3–1.2)

## 2010-11-07 LAB — CBC
Hemoglobin: 12.6 g/dL (ref 12.0–15.0)
MCH: 29.3 pg (ref 26.0–34.0)
MCHC: 33.2 g/dL (ref 30.0–36.0)
MCV: 88.4 fL (ref 78.0–100.0)

## 2010-11-07 LAB — TSH: TSH: 0.437 u[IU]/mL (ref 0.350–4.500)

## 2010-11-07 NOTE — Patient Instructions (Addendum)
See handout on sleep hygiene. Enjoy your vacation If you feel like it does not recharge you, consider counseling, or may follow-up appt to discuss stress and dizziness further. Continue daily exercise for stress relief.

## 2010-11-07 NOTE — Progress Notes (Signed)
  Subjective:    Patient ID: Bethany [name], female    DOB: [birth year], 34 y.o.   MRN: [id number]  HPI    Review of Systems     Objective:   Physical Exam     Orthostatics  Lying 94/68 pulse 62 Sitting 100/71 pulse 62 Standing 104/73, pulse 68   Assessment & Plan:

## 2010-11-07 NOTE — Assessment & Plan Note (Signed)
Likely due to stress which I believe is exacerbated by untreated mood disorder.  Patient reluctant to address mental health.  Will check TSh as previously was on low normal, and will check CBC, CMET.  Orthostatics wnl.  No hypotension or hypoglycemia during time of event.  No red flags on history.    Discussed importance of sleep, stress reduction, exercise.  Given options of counseling for stress management, work on sleep hygiene, short term medication for sleep.  She would prefer to see how her vacation goes, work on sleep hygiene, and continue to exercise during her lunch break at work. She will follow-up if she continues to have symptoms.

## 2010-11-07 NOTE — Progress Notes (Signed)
  Subjective:    Patient ID: Bethany [name], female    DOB: August 16, 1975, 35 y.o.   MRN: [id number]  HPI 1 week of dizziness and fainting.  Started off as "dizziness" and "when I talk to somebody things start to blur and I get lightheaded"  Would come and go throughout the day, and would last for 2.5 minutes each time.  Yesterday passed out at work while standing and talking to someone- did not feel dizzy or know it was happening.  Witnessed by another nurse and said that while she was talking, just fell.  Unknown if she hit the floor.  No known injuries.  Immediate regained consciousness.  No seizures or loss of bowel or bladder, not postictal.  Dizziness not associated with standing.  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  Her BP was 115/74 after this event and BS was in 160's.    Feels she "needs a vacation" as she is dealing with a complicated patient at work.    Has been having trouble sleeping, goes to bed at 2 am, and wakes up at 8 am which is earlier than she needs to for work (2nd shift).  A coworker told her she thinks it may be due to her stress and patient agrees that this may be a manifestation of stress.  Plans on taking 2 weeks off in August.  No stress at home.     Review of Systems+ for headaches, dizziness, nausea.  Negative for jittery, palpations, dyspnea, chest pain     Objective:   Physical Exam    GEN: Alert & Oriented, No acute distress HEENT: [place]/AT. EOMI, PERRLA, no conjunctival injection or scleral icterus. No obvious papilledema, exudates, hemorrhages.   Bilateral tympanic membranes intact without erythema or effusion.  .  Nares without edema or rhinorrhea.  Oropharynx is without erythema or exudates.  No anterior or posterior cervical lymphadenopathy. CV:  Regular Rate & Rhythm, no murmur Respiratory:  Normal work of breathing, CTAB Abd:  + BS, soft, no tenderness to palpation Ext: no pre-tibial edema Neuro: CN 2-12 intact.  Reflexes  2+ bilaterally.  No focal deficits.     Assessment & Plan:

## 2010-11-09 ENCOUNTER — Encounter: Payer: Self-pay | Admitting: Family Medicine

## 2011-02-01 LAB — WET PREP, GENITAL
Trich, Wet Prep: NONE SEEN
Yeast Wet Prep HPF POC: NONE SEEN

## 2011-02-01 LAB — POCT PREGNANCY, URINE
Operator id: 116391
Preg Test, Ur: NEGATIVE

## 2011-02-01 LAB — POCT URINALYSIS DIP (DEVICE)
Bilirubin Urine: NEGATIVE
Glucose, UA: NEGATIVE
Nitrite: NEGATIVE
Operator id: 116391
Urobilinogen, UA: 0.2

## 2011-04-11 ENCOUNTER — Telehealth: Payer: Self-pay | Admitting: Family Medicine
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
## 2011-04-12 ENCOUNTER — Ambulatory Visit: Payer: Self-pay

## 2011-10-01 ENCOUNTER — Other Ambulatory Visit (HOSPITAL_COMMUNITY)
Admission: RE | Admit: 2011-10-01 | Discharge: 2011-10-01 | Disposition: A | Discharge status: Home / Self Care | Payer: PRIVATE HEALTH INSURANCE | Source: Ambulatory Visit | Attending: Family Medicine | Admitting: Family Medicine

## 2011-10-01 ENCOUNTER — Ambulatory Visit (INDEPENDENT_AMBULATORY_CARE_PROVIDER_SITE_OTHER): Payer: PRIVATE HEALTH INSURANCE | Admitting: Family Medicine

## 2011-10-01 ENCOUNTER — Encounter: Payer: Self-pay | Admitting: Family Medicine

## 2011-10-01 VITALS — BP 101/70 | HR 68 | Temp 97.9°F | Ht 64.0 in | Wt 110.0 lb

## 2011-10-01 DIAGNOSIS — Z01419 Encounter for gynecological examination (general) (routine) without abnormal findings: Secondary | ICD-10-CM | POA: Insufficient documentation

## 2011-10-01 DIAGNOSIS — Z113 Encounter for screening for infections with a predominantly sexual mode of transmission: Secondary | ICD-10-CM | POA: Insufficient documentation

## 2011-10-01 DIAGNOSIS — Z124 Encounter for screening for malignant neoplasm of cervix: Secondary | ICD-10-CM

## 2011-10-01 DIAGNOSIS — N760 Acute vaginitis: Secondary | ICD-10-CM

## 2011-10-01 DIAGNOSIS — Z862 Personal history of diseases of the blood and blood-forming organs and certain disorders involving the immune mechanism: Secondary | ICD-10-CM

## 2011-10-01 LAB — LIPID PANEL: Total CHOL/HDL Ratio: 2.8 Ratio

## 2011-10-01 LAB — CBC
MCV: 85.6 fL (ref 78.0–100.0)
Platelets: 240 10*3/uL (ref 150–400)
RBC: 3.97 MIL/uL (ref 3.87–5.11)
RDW: 15.2 % (ref 11.5–15.5)
WBC: 3.8 10*3/uL — ABNORMAL LOW (ref 4.0–10.5)

## 2011-10-01 LAB — COMPREHENSIVE METABOLIC PANEL
ALT: 9 U/L (ref 0–35)
AST: 16 U/L (ref 0–37)
CO2: 25 mEq/L (ref 19–32)
Calcium: 9 mg/dL (ref 8.4–10.5)
Chloride: 106 mEq/L (ref 96–112)
Creat: 0.74 mg/dL (ref 0.50–1.10)
Potassium: 3.8 mEq/L (ref 3.5–5.3)
Sodium: 139 mEq/L (ref 135–145)
Total Protein: 6.9 g/dL (ref 6.0–8.3)

## 2011-10-01 LAB — TSH: TSH: 0.695 u[IU]/mL (ref 0.350–4.500)

## 2011-10-01 NOTE — Progress Notes (Signed)
  Subjective:     Bethany [name] is a 36 y.o. female and is here for a comprehensive physical exam. The patient reports problems - has been feeling lightheaded intermittently. Some fatigue and difficulty sleeping. Works second shift as Lawyer.Marland Kitchen  History   Social History  . Marital Status: Single    Spouse Name: N/A    Number of Children: N/A  . Years of Education: N/A   Occupational History  . Not on file.   Social History Main Topics  . Smoking status: Never Smoker   . Smokeless tobacco: Not on file  . Alcohol Use: 0.0 oz/week    0 Glasses of wine per week  . Drug Use: No  . Sexually Active: Yes   Other Topics Concern  . Not on file   Social History Narrative   Married, 3 daughters.  Works as a Lawyer at [employer] @ [employer]   Health Maintenance  Topic Date Due  . Pap Smear  02/17/1992  . Influenza Vaccine  01/21/2012  . Tetanus/tdap  11/24/2013   Family History  Problem Relation Age of Onset  . Diabetes Maternal Grandmother   . Hypertension Paternal Grandfather   . Stroke Neg Hx   . Depression Neg Hx   . Cancer Maternal Aunt     breast     The following portions of the patient's history were reviewed and updated as appropriate: allergies, current medications, past family history, past medical history, past social history, past surgical history and problem list.  Review of Systems Respiratory: negative for cough and dyspnea on exertion Cardiovascular: negative for chest pain and palpitations Genitourinary:negative for dysuria Integument/breast: negative for breast lump Musculoskeletal:positive for general aches and pains. No joint swelling or falls.   Objective:    BP 101/70  Pulse 68  Temp(Src) 97.9 F (36.6 C) (Oral)  Ht 5\' 4"  (1.626 m)  Wt 110 lb (49.896 kg)  BMI 18.88 kg/m2  LMP 09/17/2011 General appearance: alert, cooperative, appears stated age and no distress Head: Normocephalic, without obvious abnormality, atraumatic Eyes:  conjunctivae/corneas clear. PERRL, EOM's intact. Fundi benign. Throat: lips, mucosa, and tongue normal; teeth and gums normal Lungs: clear to auscultation bilaterally Breasts: normal appearance, no masses or tenderness; symmetric fibrocystic changes. Heart: regular rate and rhythm, S1, S2 normal, no murmur, click, rub or gallop Abdomen: soft, non-tender; bowel sounds normal; no masses,  no organomegaly Pelvic: cervix normal in appearance, external genitalia normal, no adnexal masses or tenderness, no cervical motion tenderness, rectovaginal septum normal, uterus normal size, shape, and consistency and vagina normal without discharge Extremities: extremities normal, atraumatic, no cyanosis or edema Pulses: 2+ and symmetric Skin: Skin color, texture, turgor normal. No rashes or lesions Neurologic: Grossly normal    Assessment:    Healthy female exam. Shift work disorder     Plan:      Pap today, GC/Ch per request.  Counseled self breast exams, yearly physical and to consider starting early mammogram screening. Supplied with breast center contact information. Check basic labs, CBC, electrolytes, TSH to check for reasons for lightheadedness-Advised f/u if recurrent. Recommended trial of melatonin for sleep, f/u if not improved.  Recommend starting exercise regimen, weight-bearing to reduce risk of osteoporosis and improved aches and pains.

## 2011-10-01 NOTE — Patient Instructions (Signed)
To stay healthy and reduce aches and pains, start some regular exercise routine. I will call if labs are abnormal, or send letter if ok. If you have lightheadedness again, make an appointment to discuss.  Health Maintenance, Females A healthy lifestyle and preventative care can promote health and wellness.  Maintain regular health, dental, and eye exams.   Eat a healthy diet. Foods like vegetables, fruits, whole grains, low-fat dairy products, and lean protein foods contain the nutrients you need without too many calories. Decrease your intake of foods high in solid fats, added sugars, and salt. Get information about a proper diet from your caregiver, if necessary.   Regular physical exercise is one of the most important things you can do for your health. Most adults should get at least 150 minutes of moderate-intensity exercise (any activity that increases your heart rate and causes you to sweat) each week. In addition, most adults need muscle-strengthening exercises on 2 or more days a week.    Maintain a healthy weight. The body mass index (BMI) is a screening tool to identify possible weight problems. It provides an estimate of body fat based on height and weight. Your caregiver can help determine your BMI, and can help you achieve or maintain a healthy weight. For adults 20 years and older:   A BMI below 18.5 is considered underweight.   A BMI of 18.5 to 24.9 is normal.   A BMI of 25 to 29.9 is considered overweight.   A BMI of 30 and above is considered obese.   Maintain normal blood lipids and cholesterol by exercising and minimizing your intake of saturated fat. Eat a balanced diet with plenty of fruits and vegetables. Blood tests for lipids and cholesterol should begin at age 39 and be repeated every 5 years. If your lipid or cholesterol levels are high, you are over 50, or you are a high risk for heart disease, you may need your cholesterol levels checked more frequently.Ongoing  high lipid and cholesterol levels should be treated with medicines if diet and exercise are not effective.   If you smoke, find out from your caregiver how to quit. If you do not use tobacco, do not start.   If you are pregnant, do not drink alcohol. If you are breastfeeding, be very cautious about drinking alcohol. If you are not pregnant and choose to drink alcohol, do not exceed 1 drink per day. One drink is considered to be 12 ounces (355 mL) of beer, 5 ounces (148 mL) of wine, or 1.5 ounces (44 mL) of liquor.   Avoid use of street drugs. Do not share needles with anyone. Ask for help if you need support or instructions about stopping the use of drugs.   High blood pressure causes heart disease and increases the risk of stroke. Blood pressure should be checked at least every 1 to 2 years. Ongoing high blood pressure should be treated with medicines, if weight loss and exercise are not effective.   If you are 70 to 38 years old, ask your caregiver if you should take aspirin to prevent strokes.   Diabetes screening involves taking a blood sample to check your fasting blood sugar level. This should be done once every 3 years, after age 72, if you are within normal weight and without risk factors for diabetes. Testing should be considered at a younger age or be carried out more frequently if you are overweight and have at least 1 risk factor for diabetes.  Breast cancer screening is essential preventative care for women. You should practice "breast self-awareness." This means understanding the normal appearance and feel of your breasts and may include breast self-examination. Any changes detected, no matter how small, should be reported to a caregiver. Women in their 76s and 30s should have a clinical breast exam (CBE) by a caregiver as part of a regular health exam every 1 to 3 years. After age 41, women should have a CBE every year. Starting at age 52, women should consider having a mammogram  (breast X-ray) every year. Women who have a family history of breast cancer should talk to their caregiver about genetic screening. Women at a high risk of breast cancer should talk to their caregiver about having an MRI and a mammogram every year.   The Pap test is a screening test for cervical cancer. Women should have a Pap test starting at age 71. Between ages 53 and 26, Pap tests should be repeated every 2 years. Beginning at age 67, you should have a Pap test every 3 years as long as the past 3 Pap tests have been normal. If you had a hysterectomy for a problem that was not cancer or a condition that could lead to cancer, then you no longer need Pap tests. If you are between ages 60 and 60, and you have had normal Pap tests going back 10 years, you no longer need Pap tests. If you have had past treatment for cervical cancer or a condition that could lead to cancer, you need Pap tests and screening for cancer for at least 20 years after your treatment. If Pap tests have been discontinued, risk factors (such as a new sexual partner) need to be reassessed to determine if screening should be resumed. Some women have medical problems that increase the chance of getting cervical cancer. In these cases, your caregiver may recommend more frequent screening and Pap tests.   The human papillomavirus (HPV) test is an additional test that may be used for cervical cancer screening. The HPV test looks for the virus that can cause the cell changes on the cervix. The cells collected during the Pap test can be tested for HPV. The HPV test could be used to screen women aged 72 years and older, and should be used in women of any age who have unclear Pap test results. After the age of 92, women should have HPV testing at the same frequency as a Pap test.   Colorectal cancer can be detected and often prevented. Most routine colorectal cancer screening begins at the age of 72 and continues through age 1. However, your  caregiver may recommend screening at an earlier age if you have risk factors for colon cancer. On a yearly basis, your caregiver may provide home test kits to check for hidden blood in the stool. Use of a small camera at the end of a tube, to directly examine the colon (sigmoidoscopy or colonoscopy), can detect the earliest forms of colorectal cancer. Talk to your caregiver about this at age 59, when routine screening begins. Direct examination of the colon should be repeated every 5 to 10 years through age 45, unless early forms of pre-cancerous polyps or small growths are found.   Hepatitis C blood testing is recommended for all people born from [birth year] through 1967 and any individual with known risks for hepatitis C.   Practice safe sex. Use condoms and avoid high-risk sexual practices to reduce the spread of sexually transmitted infections (  STIs). Sexually active women aged 56 and younger should be checked for Chlamydia, which is a common sexually transmitted infection. Older women with new or multiple partners should also be tested for Chlamydia. Testing for other STIs is recommended if you are sexually active and at increased risk.   Osteoporosis is a disease in which the bones lose minerals and strength with aging. This can result in serious bone fractures. The risk of osteoporosis can be identified using a bone density scan. Women ages 51 and over and women at risk for fractures or osteoporosis should discuss screening with their caregivers. Ask your caregiver whether you should be taking a calcium supplement or vitamin D to reduce the rate of osteoporosis.   Menopause can be associated with physical symptoms and risks. Hormone replacement therapy is available to decrease symptoms and risks. You should talk to your caregiver about whether hormone replacement therapy is right for you.   Use sunscreen with a sun protection factor (SPF) of 30 or greater. Apply sunscreen liberally and repeatedly  throughout the day. You should seek shade when your shadow is shorter than you. Protect yourself by wearing long sleeves, pants, a wide-brimmed hat, and sunglasses year round, whenever you are outdoors.   Notify your caregiver of new moles or changes in moles, especially if there is a change in shape or color. Also notify your caregiver if a mole is larger than the size of a pencil eraser.   Stay current with your immunizations.  Document Released: 10/22/2010 Document Revised: 03/28/2011 Document Reviewed: 10/22/2010 [facility] Patient Information 2012 [place], [place].

## 2011-10-03 ENCOUNTER — Telehealth: Payer: Self-pay | Admitting: *Deleted

## 2011-10-03 NOTE — Telephone Encounter (Signed)
Called pt and lvm to inform her of nl pap and that Dr. Cristal Ford would like for her to restart iron twice a day and to have this rechecked in 6 weeks.Loralee Pacas [place]

## 2011-10-03 NOTE — Telephone Encounter (Signed)
Message copied by Deno Etienne on Thu Oct 03, 2011  4:43 PM ------      Message from: Durwin Reges      Created: Thu Oct 03, 2011  9:58 AM       Please call patient. Her hemoglobin was slightly low 11.2. Otherwise everything was normal pap. Advise her to restart taking iron twice daily and come in for recheck in 6 weeks.

## 2011-10-07 NOTE — Telephone Encounter (Signed)
lvm for pt to call back to inform of nl pap and to restart iron twice a day and to come in for a lab visit to have this rechecked.Loralee Pacas [place]

## 2011-10-10 NOTE — Telephone Encounter (Signed)
Called and gave pt pap results and also to restart iron BID and to come in to have blood work done. Asked if she would like for me to make her lab appt she informed me that it has been done.Bethany [name]

## 2011-10-10 NOTE — Telephone Encounter (Signed)
Pt is returning Tonyas call, is requesting call back, see new number to call above.

## 2011-11-19 ENCOUNTER — Other Ambulatory Visit: Payer: Self-pay | Admitting: Family Medicine

## 2011-11-19 ENCOUNTER — Ambulatory Visit (INDEPENDENT_AMBULATORY_CARE_PROVIDER_SITE_OTHER): Payer: PRIVATE HEALTH INSURANCE | Admitting: Family Medicine

## 2011-11-19 ENCOUNTER — Encounter: Payer: Self-pay | Admitting: Family Medicine

## 2011-11-19 VITALS — BP 90/58 | HR 64 | Temp 98.6°F | Ht 64.0 in | Wt 108.0 lb

## 2011-11-19 DIAGNOSIS — Z862 Personal history of diseases of the blood and blood-forming organs and certain disorders involving the immune mechanism: Secondary | ICD-10-CM

## 2011-11-19 DIAGNOSIS — G43709 Chronic migraine without aura, not intractable, without status migrainosus: Secondary | ICD-10-CM | POA: Insufficient documentation

## 2011-11-19 LAB — CBC
HCT: 34.4 % — ABNORMAL LOW (ref 36.0–46.0)
Platelets: 231 10*3/uL (ref 150–400)
RBC: 4.02 MIL/uL (ref 3.87–5.11)
RDW: 15.5 % (ref 11.5–15.5)
WBC: 3.3 10*3/uL — ABNORMAL LOW (ref 4.0–10.5)

## 2011-11-19 MED ORDER — PROMETHAZINE HCL 12.5 MG PO TABS: ORAL_TABLET | ORAL | Status: DC

## 2011-11-19 NOTE — Assessment & Plan Note (Signed)
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 Most likely secondary to chronic medication overuse daily Motrin. Also identified some triggers such as excess caffeine and inconsistent sleep. No red flags with headaches today. Counseled patient regarding avoidance of Motrin and daily medication use. Advised abstinence from Motrin for next 2 weeks and to resume only at a once weekly needed dose. Provided Phenergan for use with severe headaches in the setting of nausea. We discussed possible initiation of prophylactic medication. Patient to complete headache log and return for followup in 2 weeks for further discussion. May consider beta blocker or Topamax.

## 2011-11-19 NOTE — Progress Notes (Signed)
  Subjective:    Patient ID: Bethany [name], female    DOB: 1975/08/08, 36 y.o.   MRN: [id number]  HPI  1. Migraines. Patient states she has suffered from migraines for quite some time. She is unable to quantify how long. Has been at least one year. States she has headaches consistently described as throbbing, right-sided with photophobia, nausea and phonophobia. They have increased in frequency to daily currently. This started when she began using Advil migraine medicine on a daily basis as well. She works second shift and drinks caffeine in the form of tea daily. Headache improved with rest, napping, Advil.  She denies any head trauma, neck pain, visual changes, aura, weakness, numbness, speech difficulty. No alcohol use.  2. iron deficiency anemia. The patient does have heavy cycles. She has been taking iron twice a day. Denies any weakness, fatigue, syncope, palpitations  Review of Systems See HPI otherwise negative.  reports that she has never smoked. She does not have any smokeless tobacco history on file.    Objective:   Physical Exam  Vitals reviewed. Constitutional: She is oriented to person, place, and time. She appears well-developed and well-nourished. No distress.  HENT:  Head: Normocephalic and atraumatic.  Mouth/Throat: Oropharynx is clear and moist.  Eyes: Conjunctivae and EOM are normal. Pupils are equal, round, and reactive to light. No scleral icterus.  Neck: Normal range of motion. Neck supple. No tracheal deviation present. No thyromegaly present.  Cardiovascular: Normal rate, regular rhythm, normal heart sounds and intact distal pulses.   No murmur heard. Pulmonary/Chest: Effort normal and breath sounds normal. No respiratory distress. She has no wheezes. She has no rales.  Abdominal: Soft. Bowel sounds are normal.  Musculoskeletal: She exhibits no edema and no tenderness.  Lymphadenopathy:    She has no cervical adenopathy.  Neurological: She is alert and  oriented to person, place, and time. She has normal reflexes. She displays normal reflexes. No cranial nerve deficit. She exhibits normal muscle tone. Coordination normal.       Gait normal, finger-nose normal. Fundi wnl. Sensation and strength grossly intact.  Skin: No rash noted.  Psychiatric: She has a normal mood and affect. Her behavior is normal.          Assessment & Plan:

## 2011-11-19 NOTE — Patient Instructions (Signed)
Your body is used to the advil. You must stop taking this, no more than once per week. You can try the phenergan for bad headaches. This may make you sleepy. You can try tylenol (acetaminophen) up to 650 mg 4 times a day for moderate headaches. Work on eliminated caffeine and processed foods, regular sleep. Keep track of headaches on a log. Make an appointment in 1-2 weeks for recheck. We may discuss prevention medication if needed.  Recurrent Migraine Headache You have a recurrent migraine headache. The caregiver can usually provide good relief for this headache. If this headache is the same as your previous migraine headaches, it is safe to treat you without repeating a complete evaluation.  These headaches usually have at least two of the following problems:   They occur on one side of the head, pulsate, and are severe enough to prevent daily activities.   They are aggravated by daily physical activities.  You may have one or more of the following symptoms:   Nausea (feeling sick to your stomach).   Vomiting.   Pain with exposure to bright lights or loud noises.  Most headache sufferers have a family history of migraines. Your headaches may also be related to alcohol and smoking habits. Too much sleep, too little sleep, mood, and anxiety may also play a part. Changing some of these triggers may help you lower the number and level of pain of the headaches. Headaches may be related to menses (female menstruation). There are numerous medications that can prevent these headaches. Your caregiver can help you with a medication or regimen (procedure to follow). If this has been a chronic (long-term) condition, the use of long-term narcotics is not recommended. Using long-term narcotics can cause recurrent migraines. Narcotics are only a temporary measure only. They are used for the infrequent migraine that fails to respond to all other measures. SEEK MEDICAL CARE IF:   You do not get relief from  the medications given to you.   You have a recurrence of pain.   This headache begins to differ from past migraine (for example if it is more severe).  SEEK IMMEDIATE MEDICAL CARE IF:  You have a fever.   You have a stiff neck.   You have vision loss or have changes in vision.   You have problems with feeling lightheaded, become faint, or lose your balance.   You have muscular weakness.   You have loss of muscular control.   You develop severe symptoms different from your first symptoms.   You start losing your balance or have trouble walking.   You feel faint or pass out.  MAKE SURE YOU:   Understand these instructions.   Will watch your condition.   Will get help right away if you are not doing well or get worse.  Document Released: 01/01/2001 Document Revised: 03/28/2011 Document Reviewed: 11/26/2007 [facility] Patient Information 2012 [place], [place].

## 2011-11-20 LAB — DIFFERENTIAL
Basophils Absolute: 0 10*3/uL (ref 0.0–0.1)
Lymphocytes Relative: 49 % — ABNORMAL HIGH (ref 12–46)
Lymphs Abs: 1.7 10*3/uL (ref 0.7–4.0)
Monocytes Absolute: 0.2 10*3/uL (ref 0.1–1.0)
Neutro Abs: 1.5 10*3/uL — ABNORMAL LOW (ref 1.7–7.7)

## 2011-12-03 ENCOUNTER — Telehealth: Payer: Self-pay | Admitting: Family Medicine

## 2011-12-03 DIAGNOSIS — D649 Anemia, unspecified: Secondary | ICD-10-CM

## 2011-12-03 NOTE — Telephone Encounter (Signed)
Discussed labs, low neutrophils and anemia. Patient is asymptomatic, no recurrent infections. Advised her to make lab appt for repeat check. Will f/u results as necessary.

## 2011-12-04 ENCOUNTER — Other Ambulatory Visit: Payer: PRIVATE HEALTH INSURANCE

## 2011-12-04 DIAGNOSIS — D649 Anemia, unspecified: Secondary | ICD-10-CM

## 2011-12-04 NOTE — Progress Notes (Signed)
LABS DONE TODAY Konrad Hoak 

## 2011-12-05 LAB — CBC WITH DIFFERENTIAL/PLATELET
Basophils Absolute: 0 10*3/uL (ref 0.0–0.1)
Basophils Relative: 1 % (ref 0–1)
Eosinophils Absolute: 0.1 10*3/uL (ref 0.0–0.7)
MCH: 28.5 pg (ref 26.0–34.0)
MCHC: 33 g/dL (ref 30.0–36.0)
Neutrophils Relative %: 49 % (ref 43–77)
Platelets: 220 10*3/uL (ref 150–400)

## 2011-12-05 LAB — VITAMIN B12: Vitamin B-12: 895 pg/mL (ref 211–911)

## 2011-12-05 LAB — HIV ANTIBODY (ROUTINE TESTING W REFLEX): HIV: NONREACTIVE

## 2011-12-05 LAB — RETICULOCYTES: Retic Ct Pct: 1 % (ref 0.4–2.3)

## 2011-12-06 ENCOUNTER — Telehealth: Payer: Self-pay | Admitting: *Deleted

## 2011-12-06 NOTE — Telephone Encounter (Signed)
Called and informed pt of results and told her that Dr. Cristal Ford would like for her to continue to take the iron and to follow up 3-6 months. Pt voiced understanding and agreed.Bethany [name] [place]

## 2011-12-06 NOTE — Telephone Encounter (Signed)
Message copied by Deno Etienne on Fri Dec 06, 2011  8:58 AM ------      Message from: Durwin Reges      Created: Fri Dec 06, 2011  8:55 AM      Regarding: normal results       Please inform patient lab work was normal. She should keep taking iron and will check again in 3-6 months.

## 2012-02-10 ENCOUNTER — Ambulatory Visit: Payer: Self-pay | Admitting: Family Medicine

## 2012-02-14 ENCOUNTER — Encounter: Payer: Self-pay | Admitting: Family Medicine

## 2012-02-14 ENCOUNTER — Ambulatory Visit (INDEPENDENT_AMBULATORY_CARE_PROVIDER_SITE_OTHER): Payer: PRIVATE HEALTH INSURANCE | Admitting: Family Medicine

## 2012-02-14 VITALS — BP 103/66 | HR 64 | Temp 98.2°F | Ht 64.0 in | Wt 105.0 lb

## 2012-02-14 DIAGNOSIS — G43709 Chronic migraine without aura, not intractable, without status migrainosus: Secondary | ICD-10-CM

## 2012-02-14 DIAGNOSIS — Z862 Personal history of diseases of the blood and blood-forming organs and certain disorders involving the immune mechanism: Secondary | ICD-10-CM

## 2012-02-14 LAB — CBC WITH DIFFERENTIAL/PLATELET
Eosinophils Absolute: 0 10*3/uL (ref 0.0–0.7)
Eosinophils Relative: 1 % (ref 0–5)
Lymphs Abs: 1.6 10*3/uL (ref 0.7–4.0)
MCH: 29.1 pg (ref 26.0–34.0)
MCV: 87.9 fL (ref 78.0–100.0)
Platelets: 222 10*3/uL (ref 150–400)
RDW: 13.9 % (ref 11.5–15.5)

## 2012-02-14 NOTE — Assessment & Plan Note (Signed)
Most likely etiology is heavy menses. Anemia is asymptomatic. Will recheck CBC with diff. Advised to decrease iron to prn. Advised to try NSAIDs surrounding time of menses to see if this helps her symptoms. F/u prn.

## 2012-02-14 NOTE — Assessment & Plan Note (Signed)
Improved with lifestyle intervention-drinking more water. Will not start new medications.

## 2012-02-14 NOTE — Patient Instructions (Addendum)
Nice to see you. You can decrease iron to use as needed. Try taking aleve or motrin daily just before your menstrual cycle. I will send a letter if things look good. Make appointment for yearly physical or sooner if needed.  Iron Deficiency Anemia  Anemia is when you have a low number of healthy red blood cells. HOME CARE   Ask your doctor or dietician what foods you should eat.  Take iron and vitamins as told by your doctor.  Eat foods that have iron in them. This includes liver, lean beef, whole-grain bread, eggs, dried fruit, and dark green leafy vegetables. GET HELP RIGHT AWAY IF:  You pass out (faint).  You have chest pain.  You feel sick to your stomach (nauseous) or throw up (vomit).  You get very short of breath with activity.  You are weak.  You are thirstier than normal.  You have a fast heartbeat.  You start to sweat or become lightheaded when getting up from a chair or bed. MAKE SURE YOU:  Understand these instructions.  Will watch your condition.  Will get help right away if you are not doing well or get worse. Document Released: 05/11/2010 Document Revised: 07/01/2011 Document Reviewed: 05/11/2010 [facility] Patient Information 2013 [place], [place].

## 2012-02-14 NOTE — Progress Notes (Signed)
  Subjective:    Patient ID: Bethany [name], female    DOB: 07-19-1975, 36 y.o.   MRN: [id number]  HPI  1. F/u anemia. Mild anemia and leukopenia noted on labs 3 months ago. This remains asymptomatic. She is taking iron replacement BID at this time. She endorses heavy menstrual cycles since the birth of her last child about 8 years ago. Has to change super heavy pads very frequently. She has cramping but denies irregular frequency of cycles, pelvic pain, nausea, emesis. Denies any palpitations, fatigue, weight loss, night sweats, blood in stool.  2. F/u migraines. Improved after evaluation in August. She states these improved after she began drinking more water. Has a infrequent migraine now, but is not interested in prophylactic therapy at this time.   Review of Systems See HPI otherwise negative.  reports that she has never smoked. She does not have any smokeless tobacco history on file.     Objective:   Physical Exam  Vitals reviewed. Constitutional: She is oriented to person, place, and time. She appears well-developed and well-nourished. No distress.  HENT:  Head: Normocephalic and atraumatic.  Nose: Nose normal.  Mouth/Throat: Oropharynx is clear and moist. No oropharyngeal exudate.  Eyes: EOM are normal. Pupils are equal, round, and reactive to light.  Neck: Neck supple. No thyromegaly present.  Cardiovascular: Normal rate, regular rhythm and normal heart sounds.   No murmur heard. Pulmonary/Chest: Effort normal and breath sounds normal. No respiratory distress. She has no wheezes. She has no rales.  Lymphadenopathy:    She has no cervical adenopathy.  Neurological: She is alert and oriented to person, place, and time.  Skin: No rash noted. She is not diaphoretic.  Psychiatric: She has a normal mood and affect.       Assessment & Plan:

## 2012-02-20 ENCOUNTER — Telehealth: Payer: Self-pay | Admitting: Family Medicine

## 2012-02-20 DIAGNOSIS — D72819 Decreased white blood cell count, unspecified: Secondary | ICD-10-CM

## 2012-02-20 NOTE — Telephone Encounter (Signed)
Spoke with patient and informed her of below and she voiced understanding and she is asking for a mo

## 2012-02-20 NOTE — Telephone Encounter (Signed)
Left VM on pt cell to discuss results. She still has a mild anemia and leukopenia (low white count), did not respond with iron replacement. I am not certain of any signficance because other tests were normal. Discussed case with Dr. Mahala Menghini, will make hematology referral as outpatient.

## 2012-02-21 ENCOUNTER — Telehealth: Payer: Self-pay | Admitting: Internal Medicine

## 2012-02-21 ENCOUNTER — Other Ambulatory Visit: Payer: Self-pay | Admitting: Medical Oncology

## 2012-02-21 DIAGNOSIS — D72819 Decreased white blood cell count, unspecified: Secondary | ICD-10-CM

## 2012-02-21 NOTE — Telephone Encounter (Signed)
S/W pt in re NP appt 11/04 @ 9:30 w/Dr. Arbutus Ped.  Referring Dr. Mahala Menghini Dx-Leukopenia Welcome packet will be given at registration.

## 2012-02-21 NOTE — Telephone Encounter (Signed)
C/D 02/21/12 for appt. 02/24/12

## 2012-02-24 ENCOUNTER — Ambulatory Visit (HOSPITAL_BASED_OUTPATIENT_CLINIC_OR_DEPARTMENT_OTHER): Payer: PRIVATE HEALTH INSURANCE

## 2012-02-24 ENCOUNTER — Ambulatory Visit (HOSPITAL_BASED_OUTPATIENT_CLINIC_OR_DEPARTMENT_OTHER): Payer: PRIVATE HEALTH INSURANCE | Admitting: Internal Medicine

## 2012-02-24 ENCOUNTER — Telehealth: Payer: Self-pay | Admitting: Internal Medicine

## 2012-02-24 ENCOUNTER — Encounter: Payer: Self-pay | Admitting: Internal Medicine

## 2012-02-24 ENCOUNTER — Other Ambulatory Visit (HOSPITAL_BASED_OUTPATIENT_CLINIC_OR_DEPARTMENT_OTHER): Payer: PRIVATE HEALTH INSURANCE | Admitting: Lab

## 2012-02-24 VITALS — BP 110/74 | HR 80 | Temp 97.2°F | Resp 20 | Ht 64.0 in | Wt 110.3 lb

## 2012-02-24 DIAGNOSIS — D72819 Decreased white blood cell count, unspecified: Secondary | ICD-10-CM

## 2012-02-24 DIAGNOSIS — N920 Excessive and frequent menstruation with regular cycle: Secondary | ICD-10-CM

## 2012-02-24 DIAGNOSIS — Z791 Long term (current) use of non-steroidal anti-inflammatories (NSAID): Secondary | ICD-10-CM

## 2012-02-24 LAB — COMPREHENSIVE METABOLIC PANEL (CC13)
ALT: 7 U/L (ref 0–55)
AST: 14 U/L (ref 5–34)
Alkaline Phosphatase: 36 U/L — ABNORMAL LOW (ref 40–150)
Chloride: 107 mEq/L (ref 98–107)
Creatinine: 0.9 mg/dL (ref 0.6–1.1)
Total Bilirubin: 0.48 mg/dL (ref 0.20–1.20)

## 2012-02-24 LAB — CBC WITH DIFFERENTIAL/PLATELET
BASO%: 0.6 % (ref 0.0–2.0)
EOS%: 1.2 % (ref 0.0–7.0)
HCT: 37.9 % (ref 34.8–46.6)
LYMPH%: 38.7 % (ref 14.0–49.7)
MCH: 30 pg (ref 25.1–34.0)
MCHC: 33.4 g/dL (ref 31.5–36.0)
MONO%: 10.2 % (ref 0.0–14.0)
NEUT%: 49.3 % (ref 38.4–76.8)
lymph#: 1.4 10*3/uL (ref 0.9–3.3)

## 2012-02-24 NOTE — Patient Instructions (Signed)
You have a mild leukocytopenia. We will continue on observation for now. Followup in 3 months with repeat CBC

## 2012-02-24 NOTE — Telephone Encounter (Signed)
appts made and printed for pt aom °

## 2012-02-24 NOTE — Progress Notes (Signed)
Checked in new patient. No financial issues. Patient was a little nervous.

## 2012-02-24 NOTE — Progress Notes (Signed)
[facility] Telephone:([phone]) [phone]   Fax:([phone]) [phone]  CONSULT NOTE  REASON FOR CONSULTATION:  36 years old [place] American female with Leukocytopenia.  HPI MADA SADIK is a 36 y.o. female with no significant past medical history except for history of migraine and anemia. The patient was seen recently by her primary care physician for evaluation of her anemia and CBC was performed on 02/14/2012 which showed low white blood count of 3.3 with absolute neutrophil count of 1400. Her hemoglobin was low at 11.3 and hematocrit 34.1%. The patient is currently on ferrous sulfate 325 mg by mouth 3 times a day for questionable iron deficiency anemia. She had low white blood count since July 2012. On 11/07/2010 her total white blood count was 3.3. On 11/19/2011 her total white blood count was 3.3. On 12/04/2011 her total white blood count was 3.8. She had several studies performed recently including HIV antibody that was nonreactive, vitamin B 12 was normal, serum folate was normal.  The patient using any over the counter supplement or medication except for ibuprofen which she takes up to 4 tablets a day for pain management during menstruation. She mentions that her periods are very heavy. She denied having any significant weight loss or night sweats. She denied having any palpable lymphadenopathy. She denied having any bleeding issues, bruises or ecchymosis. No significant chest pain, shortness breath, cough or hemoptysis. The patient has no family history of malignancy or blood disorder She is married and has 3 daughters. She works as a Lawyer. She has no history of smoking, alcohol or drug abuse. @SFHPI @  Past Medical History  Diagnosis Date  . History of cervical dysplasia     laser surgery  . Anemia     Past Surgical History  Procedure Date  . Tubal ligation 2003  . Cesarean section 1995  . Breast fibroadenoma surgery 2011, 2008    benign     Family History  Problem  Relation Age of Onset  . Diabetes Maternal Grandmother   . Hypertension Paternal Grandfather   . Stroke Neg Hx   . Depression Neg Hx   . Cancer Maternal Aunt     breast    Social History History  Substance Use Topics  . Smoking status: Never Smoker   . Smokeless tobacco: Not on file  . Alcohol Use: 0.0 oz/week    0 Glasses of wine per week    Allergies  Allergen Reactions  . Codeine Hives  . Oxycodone Hcl Hives    Current Outpatient Prescriptions  Medication Sig Dispense Refill  . ferrous sulfate 325 (65 FE) MG tablet Take 325 mg by mouth daily with breakfast.      . Multiple Vitamin (MULTIVITAMIN) tablet Take 1 tablet by mouth daily.        . promethazine (PHENERGAN) 12.5 MG tablet Take 1 or 2 tablets daily for headache. May cause drowsiness  20 tablet  0    Review of Systems  A comprehensive review of systems was negative.  Physical Exam  AVW:UJWJX, healthy, no distress, well nourished and well developed SKIN: skin color, texture, turgor are normal HEAD: Normocephalic, No masses, lesions, tenderness or abnormalities EYES: normal EARS: External ears normal OROPHARYNX:no exudate and no erythema  NECK: supple, no adenopathy LYMPH:  no palpable lymphadenopathy, no hepatosplenomegaly BREAST:not examined LUNGS: clear to auscultation  HEART: regular rate & rhythm, no murmurs and no gallops ABDOMEN:abdomen soft, non-tender, normal bowel sounds and no masses or organomegaly BACK: Back symmetric,  no curvature. EXTREMITIES:no joint deformities, effusion, or inflammation, no edema, no skin discoloration, no clubbing  NEURO: alert & oriented x 3 with fluent speech, no focal motor/sensory deficits  PERFORMANCE STATUS: ECOG 0  LABORATORY DATA: Lab Results  Component Value Date   WBC 3.7* 02/24/2012   HGB 12.7 02/24/2012   HCT 37.9 02/24/2012   MCV 89.9 02/24/2012   PLT 188 02/24/2012      Chemistry      Component Value Date/Time   NA 140 02/24/2012 0944   NA 139  10/01/2011 0937   K 3.5 02/24/2012 0944   K 3.8 10/01/2011 0937   CL 107 02/24/2012 0944   CL 106 10/01/2011 0937   CO2 25 02/24/2012 0944   CO2 25 10/01/2011 0937   BUN 9.0 02/24/2012 0944   BUN 8 10/01/2011 0937   CREATININE 0.9 02/24/2012 0944   CREATININE 0.74 10/01/2011 0937   CREATININE 0.84 10/07/2007 2023      Component Value Date/Time   CALCIUM 9.5 02/24/2012 0944   CALCIUM 9.0 10/01/2011 0937   ALKPHOS 36* 02/24/2012 0944   ALKPHOS 34* 10/01/2011 0937   AST 14 02/24/2012 0944   AST 16 10/01/2011 0937   ALT 7 02/24/2012 0944   ALT 9 10/01/2011 0937   BILITOT 0.48 02/24/2012 0944   BILITOT 0.3 10/01/2011 0937       RADIOGRAPHIC STUDIES: No results found.  ASSESSMENT: This is a very pleasant 36 years old [place] American female presented for evaluation of mild leukocytopenia. 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   PLAN: I had a lengthy discussion with the patient today about her condition. I recommended for her to continue on observation for now with repeat CBC in 3 months. I don't see any concerning signs or symptoms for bone marrow infiltrative or neoplastic process at this point. I don't see a need for further evaluation and invasive procedure at this point. I would see the patient back for followup visit in 3 months and if she continues to have persistent or worsening leukocytopenia I may consider her for a bone marrow biopsy and aspirate. The patient was advised to continue on her iron supplements for treatment of her anemia. She was advised to call me immediately if she has any concerning symptoms in the interval.  All questions were answered. The patient knows to call the clinic with any problems, questions or concerns. We can certainly see the patient much sooner if necessary.  Thank you so much for allowing me to participate in the care of [facility]. I will continue to follow up the patient with you and assist in her care.  I spent  30 minutes counseling the patient face to face. The total time spent in the appointment was 50 minutes.  Lynton Crescenzo K. 02/24/2012, 10:53 AM

## 2012-04-17 ENCOUNTER — Ambulatory Visit (INDEPENDENT_AMBULATORY_CARE_PROVIDER_SITE_OTHER): Payer: PRIVATE HEALTH INSURANCE | Admitting: Family Medicine

## 2012-04-17 ENCOUNTER — Encounter: Payer: Self-pay | Admitting: Family Medicine

## 2012-04-17 VITALS — BP 110/67 | HR 73 | Temp 98.1°F | Ht 64.0 in | Wt 110.0 lb

## 2012-04-17 DIAGNOSIS — G43709 Chronic migraine without aura, not intractable, without status migrainosus: Secondary | ICD-10-CM

## 2012-04-17 DIAGNOSIS — E079 Disorder of thyroid, unspecified: Secondary | ICD-10-CM

## 2012-04-17 DIAGNOSIS — D72819 Decreased white blood cell count, unspecified: Secondary | ICD-10-CM

## 2012-04-17 LAB — CBC
HCT: 40.3 % (ref 36.0–46.0)
Hemoglobin: 13.5 g/dL (ref 12.0–15.0)
MCH: 29.2 pg (ref 26.0–34.0)
MCV: 87 fL (ref 78.0–100.0)
RBC: 4.63 MIL/uL (ref 3.87–5.11)

## 2012-04-17 LAB — TSH: TSH: 0.253 u[IU]/mL — ABNORMAL LOW (ref 0.350–4.500)

## 2012-04-17 MED ORDER — PROMETHAZINE HCL 25 MG/ML IJ SOLN
25.0000 mg | Freq: Once | INTRAMUSCULAR | Status: AC
  Administered 2012-04-17: 25 mg via INTRAMUSCULAR

## 2012-04-17 MED ORDER — SUMATRIPTAN SUCCINATE 6 MG/0.5ML ~~LOC~~ SOLN
6.0000 mg | Freq: Once | SUBCUTANEOUS | Status: AC
  Administered 2012-04-17: 6 mg via SUBCUTANEOUS

## 2012-04-17 NOTE — Progress Notes (Signed)
S: Pt comes in today for SDA for headache, nausea, blurry vision on R and numbness on R side of face.  Of note, patient with a known history of chronic migraines/headaches.  She reports that while eating lunch today at a restaurant, started getting nauseated.  Room then got dim and had blurry vision- worse on the right side (only blurry when looking out of right eye).  Did not have a headache at that time.  Numbness on R side of face started ~30 minutes after blurry vision resolved.  Still having numbness in her face now.  Headache started a little bit later in her temples, which is her usual headache area/feeling.  No numbness/tingling anywhere other than face.  Nausea had resolved but started since getting here to clinic.  No URI or GI symptoms prior to today.  Has never had anything like this happen before.  +photo and phonophobia x2 weeks   Is not able to take any OTC medicines because hematologist told her to stop with her anemia and leukopenia.  Did just finish her menstrual cycle, which was heavy and lasted 2 weeks.     ROS: Per HPI  History  Smoking status  . Never Smoker   Smokeless tobacco  . Not on file    O:  Filed Vitals:   04/17/12 1505  BP: 110/67  Pulse: 73  Temp: 98.1 F (36.7 C)    Gen: NAD HEENT: MMM, EOMI, PERRLA, optic discs appear normal; no nuchal rigidity  CV: RRR, no murmur Pulm: CTA bilat, no wheezes or crackles Neuro: CN2-12 grossly intact, moves ext x4, 5/5 strength bilateral upper and lower ext, sensation intact throughout but sensitive to light touch over right side of face- from forehead to base of neck   A/P: 36 y.o. female p/w migraine -See problem list -f/u in PRN

## 2012-04-17 NOTE — Assessment & Plan Note (Addendum)
Given IM phenergan 25mg  and imitrex 6mg  in clinic.  Will check TSH and CBC but this is most likely migraine.  Exam without red flags, including no pupil defects, nuchal rigidity, fever, or focal neuro findings.  Only concern is sensitivity to light touch, as pt works in assisted living but does not think there have been any shingles outbreaks-- would need to be treated if rash starts, pt informed to call if this happens.  Given note to be out of work [employer].  Red flags for ER discussed.  Precepted with Dr. Earnest Bailey.

## 2012-04-17 NOTE — Patient Instructions (Signed)
It was nice to meet you.  I think your symptoms are from a migraine.  We gave you 2 medicines in clinic-- 1 for the headache and 1 for the nausea.  Hopefully these will help you get some sleep when you get home.  We are rechecking some blood work today as well.  Come back as needed.  If you start feeling worse over the weekend, you can always go to the ER as well.

## 2012-05-05 ENCOUNTER — Telehealth: Payer: Self-pay | Admitting: Family Medicine

## 2012-05-05 NOTE — Telephone Encounter (Signed)
Pt is asking for her lab results - she is at work after [employer]

## 2012-05-06 NOTE — Telephone Encounter (Signed)
I assume she means labs drawn last month by Dr. Fara Boros?  Tell her looks ok, except thyroid slightly low so will recheck in 2 months. Nothing to worry about. Please have her make MD visit in 2 months or sooner if needed.

## 2012-05-07 NOTE — Telephone Encounter (Signed)
cdsfdf

## 2012-05-26 ENCOUNTER — Ambulatory Visit (HOSPITAL_BASED_OUTPATIENT_CLINIC_OR_DEPARTMENT_OTHER): Payer: PRIVATE HEALTH INSURANCE | Admitting: Internal Medicine

## 2012-05-26 ENCOUNTER — Encounter: Payer: Self-pay | Admitting: Internal Medicine

## 2012-05-26 ENCOUNTER — Other Ambulatory Visit (HOSPITAL_BASED_OUTPATIENT_CLINIC_OR_DEPARTMENT_OTHER): Payer: PRIVATE HEALTH INSURANCE | Admitting: Lab

## 2012-05-26 VITALS — BP 152/83 | HR 68 | Temp 97.8°F | Resp 20 | Ht 64.0 in | Wt 112.6 lb

## 2012-05-26 DIAGNOSIS — D72819 Decreased white blood cell count, unspecified: Secondary | ICD-10-CM

## 2012-05-26 LAB — CBC WITH DIFFERENTIAL/PLATELET
BASO%: 0.4 % (ref 0.0–2.0)
Basophils Absolute: 0 10*3/uL (ref 0.0–0.1)
EOS%: 1 % (ref 0.0–7.0)
HCT: 39.5 % (ref 34.8–46.6)
HGB: 13.4 g/dL (ref 11.6–15.9)
LYMPH%: 41.5 % (ref 14.0–49.7)
MCH: 30.1 pg (ref 25.1–34.0)
MCHC: 33.9 g/dL (ref 31.5–36.0)
NEUT%: 48.3 % (ref 38.4–76.8)
Platelets: 177 10*3/uL (ref 145–400)

## 2012-05-26 NOTE — Patient Instructions (Signed)
Your leukocytopenia is resolved. Continue routine followup visit with your primary care physician.

## 2012-05-26 NOTE — Progress Notes (Signed)
[facility] Telephone:([phone]   Fax:(336) [phone]  OFFICE PROGRESS NOTE  Lloyd Huger, MD [address] [place] [address]  DIAGNOSIS: Drug-induced leukocytopenia, resolved.  PRIOR THERAPY: None  CURRENT THERAPY: None  INTERVAL HISTORY: Bethany [name] 37 y.o. female returns to the clinic today for followup visit accompanied her mother. The patient is feeling fine today with no specific complaints. She denied having any significant weight loss or night sweats. She denied having any chest pain, shortness breath, cough or hemoptysis. She has no fatigue or weakness. She quit using over-the-counter ibuprofen since her last visit. She has repeat CBC performed earlier today and she is here for evaluation and discussion of her lab results.  MEDICAL HISTORY: Past Medical History  Diagnosis Date  . History of cervical dysplasia     laser surgery  . Anemia     ALLERGIES:  is allergic to codeine and oxycodone hcl.  MEDICATIONS:  Current Outpatient Prescriptions  Medication Sig Dispense Refill  . ferrous sulfate 325 (65 FE) MG tablet Take 325 mg by mouth daily with breakfast.      . Multiple Vitamin (MULTIVITAMIN) tablet Take 1 tablet by mouth daily.        . promethazine (PHENERGAN) 12.5 MG tablet Take 1 or 2 tablets daily for headache. May cause drowsiness  20 tablet  0    SURGICAL HISTORY:  Past Surgical History  Procedure Date  . Tubal ligation 2003  . Cesarean section 1995  . Breast fibroadenoma surgery 2011, 2008    benign     REVIEW OF SYSTEMS:  A comprehensive review of systems was negative.   PHYSICAL EXAMINATION: General appearance: alert, cooperative and no distress Head: Normocephalic, without obvious abnormality, atraumatic Neck: no adenopathy Lymph nodes: Cervical, supraclavicular, and axillary nodes normal. Resp: clear to auscultation bilaterally Cardio: regular rate and rhythm, S1, S2 normal, no murmur, click, rub or  gallop GI: soft, non-tender; bowel sounds normal; no masses,  no organomegaly Extremities: extremities normal, atraumatic, no cyanosis or edema  ECOG PERFORMANCE STATUS: 0 - Asymptomatic  Blood pressure 152/83, pulse 68, temperature 97.8 F (36.6 C), temperature source Oral, resp. rate 20, height 5\' 4"  (1.626 m), weight 112 lb 9.6 oz (51.075 kg).  LABORATORY DATA: Lab Results  Component Value Date   WBC 3.9 05/26/2012   HGB 13.4 05/26/2012   HCT 39.5 05/26/2012   MCV 88.7 05/26/2012   PLT 177 05/26/2012      Chemistry      Component Value Date/Time   NA 140 02/24/2012 0944   NA 139 10/01/2011 0937   K 3.5 02/24/2012 0944   K 3.8 10/01/2011 0937   CL 107 02/24/2012 0944   CL 106 10/01/2011 0937   CO2 25 02/24/2012 0944   CO2 25 10/01/2011 0937   BUN 9.0 02/24/2012 0944   BUN 8 10/01/2011 0937   CREATININE 0.9 02/24/2012 0944   CREATININE 0.74 10/01/2011 0937   CREATININE 0.84 10/07/2007 2023      Component Value Date/Time   CALCIUM 9.5 02/24/2012 0944   CALCIUM 9.0 10/01/2011 0937   ALKPHOS 36* 02/24/2012 0944   ALKPHOS 34* 10/01/2011 0937   AST 14 02/24/2012 0944   AST 16 10/01/2011 0937   ALT 7 02/24/2012 0944   ALT 9 10/01/2011 0937   BILITOT 0.48 02/24/2012 0944   BILITOT 0.3 10/01/2011 0937       RADIOGRAPHIC STUDIES: No results found.  ASSESSMENT: This is a very pleasant 37  years old [place] American female with history of leukocytopenia most likely was drug-induced secondary to over-the-counter use of ibuprofen. The patient quit ibuprofen since her last visit and her leukocytopenia resolved.   PLAN: I discussed the lab result with the patient today. I recommended for her to continue on observation with her primary care physician. I advised her to call immediately if she has any concerning symptoms in the interval. I don't see a need for the patient to continue routine followup visit with me at this point but will be happy to see in the future if needed.  All questions were answered.  The patient knows to call the clinic with any problems, questions or concerns. We can certainly see the patient much sooner if necessary.

## 2012-06-18 ENCOUNTER — Ambulatory Visit (INDEPENDENT_AMBULATORY_CARE_PROVIDER_SITE_OTHER): Payer: PRIVATE HEALTH INSURANCE | Admitting: Family Medicine

## 2012-06-18 ENCOUNTER — Encounter: Payer: Self-pay | Admitting: Family Medicine

## 2012-06-18 VITALS — BP 107/74 | HR 76 | Temp 98.3°F | Ht 64.0 in | Wt 114.0 lb

## 2012-06-18 DIAGNOSIS — Z862 Personal history of diseases of the blood and blood-forming organs and certain disorders involving the immune mechanism: Secondary | ICD-10-CM

## 2012-06-18 DIAGNOSIS — J329 Chronic sinusitis, unspecified: Secondary | ICD-10-CM

## 2012-06-18 DIAGNOSIS — E079 Disorder of thyroid, unspecified: Secondary | ICD-10-CM

## 2012-06-18 LAB — T3, FREE: T3, Free: 3.3 pg/mL (ref 2.3–4.2)

## 2012-06-18 MED ORDER — AMOXICILLIN-POT CLAVULANATE 875-125 MG PO TABS: 1.0000 | ORAL_TABLET | Freq: Two times a day (BID) | ORAL | Status: DC

## 2012-06-18 NOTE — Assessment & Plan Note (Signed)
Viral vs early bacterial. Discussed with patient risk and benefits of antibiotic treatment. Given paper rx to fill if symptoms worsening or not improving in 3 days. Discussed symptomatic care nasal saline, decongestant. F/u prn.

## 2012-06-18 NOTE — Assessment & Plan Note (Signed)
Recheck labs today. Consider re-uptake scan of thyroid pending results. Could potentially be underlying her anxiety, and we agree to rule out this possibility prior to discussing treatment of anxiety disorder.

## 2012-06-18 NOTE — Progress Notes (Signed)
  Subjective:    Patient ID: Bethany [name], female    DOB: 24-Apr-1975, 37 y.o.   MRN: [id number]  Sinus Problem    1. F/u low TSH. Patient has known intermittent borderline low TSH for years, always with normal free hormones. Never had imaging or known thyroid diagnoses or family history. She has always been low end of weight, gained 3 lbs since last year. Does have some anxiety attacks that seem to occur weekly. This concerns her, but mainly because she doesn't want her husband to think she is going through menopause. Endorses some constipation with taking iron, palpitations only with panic/anxiety symptoms. No rash, confusion, neck swelling, fevers.  2. Anemia. Previously had mild anemia due to heavy menstrual cycles and still taking iron BID though was told she can stop taking before. Endorses having heavy bleeding for 7-8 days her entire adulthood. Still monthly regular cycles and hasn't changed. She mentions she is interested in stopping having cycles. Denies syncope, lightheadedness, dyspnea, chest pain.  3. Sinus problems. 7 days of rhinorrhea, worse at night, nasal sinus pressure. No fever, chills, headache, cough, wheezing, dyspnea.   Review of Systems See HPI otherwise negative.  reports that she has never smoked. She does not have any smokeless tobacco history on file.     Objective:   Physical Exam  Vitals reviewed. Constitutional: She is oriented to person, place, and time. She appears well-developed and well-nourished. No distress.  HENT:  Head: Normocephalic and atraumatic.  Mouth/Throat: Oropharynx is clear and moist.  Right ear possible effusion. No dullness or erythema. Rhinorrhea+. No sinus TTP.  Eyes: EOM are normal. Pupils are equal, round, and reactive to light.  Neck: Normal range of motion. Neck supple. No thyromegaly present.  Cardiovascular: Normal rate, regular rhythm and normal heart sounds.   No murmur heard. Pulmonary/Chest: Effort normal and breath  sounds normal. No respiratory distress. She has no wheezes. She has no rales.  Lymphadenopathy:    She has no cervical adenopathy.  Neurological: She is alert and oriented to person, place, and time.  Skin: She is not diaphoretic.  Psychiatric: She has a normal mood and affect.          Assessment & Plan:

## 2012-06-18 NOTE — Patient Instructions (Signed)
I think you have a sinus infection. Could be a virus and it could away on its own, but could be a bacteria and an antibiotic might help. If symptoms are worse by Sunday, fill the prescription.  Eat some probiotics after antibiotic. Take some nasal saline and decongestant if needed. If symptoms do not improve, return to clinic. If thyroid function is abnormal, we will order a scan and I will call you.

## 2012-06-18 NOTE — Assessment & Plan Note (Signed)
Last hemoglobin is wnl. Discussed possibility of IUD if she desires treatment for heavy menses. Since this seems more like primary menorrhagia as opposed to secondary, do not feel strongly but patient may consider an endometrial biopsy prior to IUD placement. Will consider this and advised patient to return if she decides to seek treatment.

## 2012-06-24 ENCOUNTER — Encounter: Payer: Self-pay | Admitting: Family Medicine

## 2013-05-07 ENCOUNTER — Ambulatory Visit (INDEPENDENT_AMBULATORY_CARE_PROVIDER_SITE_OTHER): Payer: PRIVATE HEALTH INSURANCE | Admitting: Family Medicine

## 2013-05-07 ENCOUNTER — Encounter: Payer: Self-pay | Admitting: Family Medicine

## 2013-05-07 VITALS — BP 100/64 | HR 67 | Temp 98.0°F | Ht 64.0 in | Wt 108.0 lb

## 2013-05-07 DIAGNOSIS — Z0000 Encounter for general adult medical examination without abnormal findings: Secondary | ICD-10-CM | POA: Insufficient documentation

## 2013-05-07 DIAGNOSIS — R946 Abnormal results of thyroid function studies: Secondary | ICD-10-CM

## 2013-05-07 DIAGNOSIS — R7989 Other specified abnormal findings of blood chemistry: Secondary | ICD-10-CM

## 2013-05-07 LAB — BASIC METABOLIC PANEL WITH GFR
BUN: 10 mg/dL (ref 6–23)
CO2: 25 meq/L (ref 19–32)
Calcium: 9.5 mg/dL (ref 8.4–10.5)
Chloride: 103 meq/L (ref 96–112)
Creat: 0.8 mg/dL (ref 0.50–1.10)
Glucose, Bld: 84 mg/dL (ref 70–99)
Potassium: 3.8 meq/L (ref 3.5–5.3)
Sodium: 138 meq/L (ref 135–145)

## 2013-05-07 LAB — TSH: TSH: 0.394 u[IU]/mL (ref 0.350–4.500)

## 2013-05-07 LAB — CBC
HCT: 38.6 % (ref 36.0–46.0)
Hemoglobin: 12.9 g/dL (ref 12.0–15.0)
MCH: 28.5 pg (ref 26.0–34.0)
MCHC: 33.4 g/dL (ref 30.0–36.0)
MCV: 85.4 fL (ref 78.0–100.0)
Platelets: 229 K/uL (ref 150–400)
RBC: 4.52 MIL/uL (ref 3.87–5.11)
RDW: 14.1 % (ref 11.5–15.5)
WBC: 4 K/uL (ref 4.0–10.5)

## 2013-05-07 LAB — T3, FREE: T3, Free: 3.2 pg/mL (ref 2.3–4.2)

## 2013-05-07 LAB — T4, FREE: FREE T4: 0.98 ng/dL (ref 0.80–1.80)

## 2013-05-07 NOTE — Patient Instructions (Signed)
It has been a pleasure to see you today. All your physical exam is normal. I will call you with the labs results if they come back abnormal otherwise you will receive a letter. Make a f/u appointment in one year or sooner if needed.

## 2013-05-07 NOTE — Assessment & Plan Note (Signed)
Asymptomatic. Normal vitals and physical exam. Pap smears normal and next will be in 2016.  TSH repeated due to prior hx of abnormal TSH.

## 2013-05-07 NOTE — Progress Notes (Signed)
Family Medicine Office Visit Note   Subjective:   Patient ID: Bethany [name], female  DOB: 1975-10-31, 38 y.o.. MRN: [id number]   This is my first time seem Bethany [name], she comes today for her annual visit. She has no present complaints. She reports concerns about her prior TSH in Dec 2013. She got this test repeated and they were wnl.   Health maint: Pap smear done in 2013 normal. Denies hx of abnormal pap smear. Next in 2016.   Review of Systems:  Pt denies SOB, chest pain, palpitations, headaches, dizziness, numbness or weakness. No changes on urinary or BM habits. No unintentional weigh loss/gain.  Objective:   Physical Exam: Gen:  NAD HEENT: Moist mucous membranes. Neck: no goiter, supple, without masses, no adenopathies.  CV: Regular rate and rhythm, no murmurs rubs or gallops PULM: Clear to auscultation bilaterally. No wheezes/rales/rhonchi ABD: Soft, non tender, non distended, normal bowel sounds EXT: No edema Neuro: Alert and oriented x3. No focalization  Assessment & Plan:

## 2013-06-10 ENCOUNTER — Telehealth: Payer: Self-pay | Admitting: Family Medicine

## 2013-06-10 NOTE — Telephone Encounter (Signed)
Pt called and would like her lab results from last month. jw

## 2013-06-11 NOTE — Telephone Encounter (Signed)
Labs are within normal range I am going to mail her a copy. Thanks you.

## 2013-06-11 NOTE — Telephone Encounter (Signed)
Relayed message,patient voiced understanding. Annise Boran S  

## 2013-08-03 ENCOUNTER — Encounter: Payer: Self-pay | Admitting: Family Medicine

## 2013-08-03 ENCOUNTER — Other Ambulatory Visit: Payer: Self-pay

## 2013-08-03 ENCOUNTER — Ambulatory Visit (INDEPENDENT_AMBULATORY_CARE_PROVIDER_SITE_OTHER): Payer: PRIVATE HEALTH INSURANCE | Admitting: Family Medicine

## 2013-08-03 VITALS — BP 97/57 | HR 70 | Temp 98.8°F | Ht 64.0 in | Wt 108.0 lb

## 2013-08-03 DIAGNOSIS — N6320 Unspecified lump in the left breast, unspecified quadrant: Secondary | ICD-10-CM

## 2013-08-03 DIAGNOSIS — N63 Unspecified lump in unspecified breast: Secondary | ICD-10-CM

## 2013-08-03 NOTE — Assessment & Plan Note (Signed)
3 cm mobile left upper quadrant. Incidentally found on self exam. No adenopathies, no nipple discharge. Hx of R breast cyst s/p surgically removed in 2011. Aunt with BC. Non smoker.  P/ Breast ultrasound F/u with results.

## 2013-08-03 NOTE — Patient Instructions (Signed)
You have been scheduled for a breast ultrasound. Please follow up results.

## 2013-08-03 NOTE — Progress Notes (Signed)
Family Medicine Office Visit Note   Subjective:   Patient ID: Bethany [name], female  DOB: 07/30/1975, 38 y.o.. MRN: [id number]   Pt that comes today for same day appointment to discuss a left breast mass she found last Sunday. She reports this was completely incidental without symptoms of pain, redness, swelling or breast discharge. She has cyst removed on 2011 and and denies smoking history. Has aunt with [facility] s/p mastectomy unclear age of diagnosis. No other symptoms or complaints.   Review of Systems:  Pt denies SOB, chest pain, palpitations, headaches, dizziness, numbness or weakness. No changes on urinary or BM habits. No unintentional weigh loss/gain.  Objective:   Physical Exam: Gen:  NAD HEENT: Moist mucous membranes  CV: Regular rate and rhythm, no murmurs rubs or gallops PULM: Clear to auscultation bilaterally. No wheezes/rales/rhonchi Breasts: R left appear normal, no suspicious masses, no skin or nipple changes or axillary nodes. Left breast with 3 cm mass located on 3 o clock, mobile, no adhered to deep planes, regular surface to palpation. No axillary nodes felt. No nipple discharge. ABD: Soft, non tender, non distended, normal bowel sounds EXT: No edema Neuro: Alert and oriented x3. No focalization  Assessment & Plan:

## 2013-08-04 ENCOUNTER — Ambulatory Visit
Admission: RE | Admit: 2013-08-04 | Discharge: 2013-08-04 | Disposition: A | Discharge status: Home / Self Care | Payer: PRIVATE HEALTH INSURANCE | Source: Ambulatory Visit | Attending: Family Medicine | Admitting: Family Medicine

## 2013-08-04 DIAGNOSIS — N6320 Unspecified lump in the left breast, unspecified quadrant: Secondary | ICD-10-CM

## 2013-12-09 ENCOUNTER — Encounter (HOSPITAL_COMMUNITY): Payer: Self-pay | Admitting: Emergency Medicine

## 2013-12-09 ENCOUNTER — Emergency Department (INDEPENDENT_AMBULATORY_CARE_PROVIDER_SITE_OTHER)
Admission: EM | Admit: 2013-12-09 | Discharge: 2013-12-09 | Disposition: A | Discharge status: Home / Self Care | Payer: PRIVATE HEALTH INSURANCE | Source: Home / Self Care | Attending: Emergency Medicine | Admitting: Emergency Medicine

## 2013-12-09 DIAGNOSIS — Y92009 Unspecified place in unspecified non-institutional (private) residence as the place of occurrence of the external cause: Secondary | ICD-10-CM

## 2013-12-09 DIAGNOSIS — Z23 Encounter for immunization: Secondary | ICD-10-CM

## 2013-12-09 DIAGNOSIS — IMO0002 Reserved for concepts with insufficient information to code with codable children: Secondary | ICD-10-CM

## 2013-12-09 DIAGNOSIS — T148XXA Other injury of unspecified body region, initial encounter: Secondary | ICD-10-CM

## 2013-12-09 DIAGNOSIS — T1490XA Injury, unspecified, initial encounter: Secondary | ICD-10-CM

## 2013-12-09 MED ORDER — TETANUS-DIPHTH-ACELL PERTUSSIS 5-2.5-18.5 LF-MCG/0.5 IM SUSP
0.5000 mL | Freq: Once | INTRAMUSCULAR | Status: AC
  Administered 2013-12-09: 0.5 mL via INTRAMUSCULAR

## 2013-12-09 MED ORDER — TETANUS-DIPHTH-ACELL PERTUSSIS 5-2.5-18.5 LF-MCG/0.5 IM SUSP
INTRAMUSCULAR | Status: AC
  Filled 2013-12-09: qty 0.5

## 2013-12-09 NOTE — Discharge Instructions (Signed)

## 2013-12-09 NOTE — ED Notes (Signed)
Reports laceration to right hand between thumb and index finger.   States washing dishes and cut hand on a chipped glass.  Incident happened around 11:15 a.m.  Today.  Pt is up to date on tetanus.

## 2013-12-09 NOTE — ED Provider Notes (Signed)
  Chief Complaint   Chief Complaint  Patient presents with  . Laceration    History of Present Illness   Bethany [name] is a 38 year old female who sustained a laceration from broken glass at home to her right hand in the webspace thumb and index finger today around 11:15 AM her last tetanus vaccine was 10 years ago. She has good use of all her digits and no numbness or tingling.  Review of Systems   Other than as noted above, the patient denies any of the following symptoms: Systemic:  No fevers or chills. Musculoskeletal:  No joint pain or arthritis.  Neurological:  No muscular weakness or paresthesias.  [place]   Past medical history, family history, social history, meds, and allergies were reviewed.     Physical Examination   Vital signs:  BP 113/71  Pulse 66  Temp(Src) 98.6 F (37 C) (Oral)  Resp 14  SpO2 100%  LMP 11/30/2013 Gen:  Alert and oriented times 3.  In no distress. Musculoskeletal:  Exam of the hand reveals she has a tiny, shallow laceration of the webspace between the thumb and index finger of the right hand.  Otherwise, all joints had a full a ROM with no swelling, bruising or deformity.  No edema, pulses full. Extremities were warm and pink.  Capillary refill was brisk.  Skin:  Clear, warm and dry.  No rash. Neuro:  Alert and oriented times 3.  Muscle strength was normal.  Sensation was intact to light touch.   Course in Urgent [place]   This was cleansed with saline, antibiotic ointment was applied and a sterile dressing. She was given a Tdap vaccine.  Assessment   The primary encounter diagnosis was Laceration. Diagnoses of Accidental injury and Place of occurrence, home were also pertinent to this visit.  Plan  1.  Meds:  The following meds were prescribed:   Discharge Medication List as of 12/09/2013  1:29 PM      2.  Patient Education/Counseling:  The patient was instructed in wound care.   3.  Follow up:  The patient was told to follow up  here if there is any evidence of infection.      Harden Mo, MD 12/09/13 [phone]

## 2014-03-04 ENCOUNTER — Ambulatory Visit (INDEPENDENT_AMBULATORY_CARE_PROVIDER_SITE_OTHER): Payer: PRIVATE HEALTH INSURANCE | Admitting: Family Medicine

## 2014-03-04 ENCOUNTER — Encounter: Payer: Self-pay | Admitting: Family Medicine

## 2014-03-04 VITALS — Temp 98.0°F | Wt 107.0 lb

## 2014-03-04 DIAGNOSIS — H8111 Benign paroxysmal vertigo, right ear: Secondary | ICD-10-CM

## 2014-03-04 DIAGNOSIS — H8110 Benign paroxysmal vertigo, unspecified ear: Secondary | ICD-10-CM | POA: Insufficient documentation

## 2014-03-04 DIAGNOSIS — R42 Dizziness and giddiness: Secondary | ICD-10-CM

## 2014-03-04 LAB — FERRITIN: Ferritin: 14 ng/mL (ref 10–291)

## 2014-03-04 LAB — IRON: Iron: 45 ug/dL (ref 42–145)

## 2014-03-04 MED ORDER — PROMETHAZINE HCL 25 MG PO TABS
25.0000 mg | ORAL_TABLET | Freq: Three times a day (TID) | ORAL | Status: DC | PRN
Start: 2014-03-04 — End: 2014-05-09

## 2014-03-04 NOTE — Assessment & Plan Note (Signed)
Most likely BPPV 2/2 cold/illness - Phenergan for dizziness and to help sleep - Epley Maneuver explained and given in handout - F/U in 1 week if no improvement - No driving until resolution and written on work note for pt.

## 2014-03-04 NOTE — Progress Notes (Signed)
Bethany [name] is a 38 y.o. female who presents today for vertigo/dizziness.  Vertigo - Pt c/o 4 days of vertigo, with the room spinning around her, which started after having an URI for about 2 days prior to this.  Sx worse when trying to sit up right or changing positions.  Dizziness/vertigo lasts for about 1 minute prior to dissipating.  She has had one or two episodes like this in the past but have self-resolved.  Denies any blurred vision, diplopia, fever, chills, sweats, dysphagia, CP/SOB/N/V/D, extremity edema.  No HA a/w this as well.   Past Medical History  Diagnosis Date  . History of cervical dysplasia     laser surgery  . Anemia     History  Smoking status  . Never Smoker   Smokeless tobacco  . Not on file    Family History  Problem Relation Age of Onset  . Diabetes Maternal Grandmother   . Hypertension Paternal Grandfather   . Stroke Neg Hx   . Depression Neg Hx   . Cancer Maternal Aunt     breast    Current Outpatient Prescriptions on File Prior to Visit  Medication Sig Dispense Refill  . amoxicillin-clavulanate (AUGMENTIN) 875-125 MG per tablet Take 1 tablet by mouth 2 (two) times daily. 10 tablet 0  . ferrous sulfate 325 (65 FE) MG tablet Take 325 mg by mouth daily with breakfast.    . Multiple Vitamin (MULTIVITAMIN) tablet Take 1 tablet by mouth daily.       No current facility-administered medications on file prior to visit.    ROS: Per HPI.  All other systems reviewed and are negative.   Physical Exam Filed Vitals:   03/04/14 1142  Temp: 98 F (36.7 C)    Physical Examination: General appearance - alert, well appearing, and in no distress Eyes - left eye normal, right eye normal Ears - bilateral TM's and external ear canals normal Nose - normal and patent, no erythema, discharge or polyps Neck - supple, no significant adenopathy + Dix-Hallpike Maneuver to the R inducing vertigo

## 2014-03-04 NOTE — Patient Instructions (Signed)
Benign Positional Vertigo Vertigo means you feel like you or your surroundings are moving when they are not. Benign positional vertigo is the most common form of vertigo. Benign means that the cause of your condition is not serious. Benign positional vertigo is more common in older adults. CAUSES  Benign positional vertigo is the result of an upset in the labyrinth system. This is an area in the middle ear that helps control your balance. This may be caused by a viral infection, head injury, or repetitive motion. However, often no specific cause is found. SYMPTOMS  Symptoms of benign positional vertigo occur when you move your head or eyes in different directions. Some of the symptoms may include:  Loss of balance and falls.  Vomiting.  Blurred vision.  Dizziness.  Nausea.  Involuntary eye movements (nystagmus). DIAGNOSIS  Benign positional vertigo is usually diagnosed by physical exam. If the specific cause of your benign positional vertigo is unknown, your caregiver may perform imaging tests, such as magnetic resonance imaging (MRI) or computed tomography (CT). TREATMENT  Your caregiver may recommend movements or procedures to correct the benign positional vertigo. Medicines such as meclizine, benzodiazepines, and medicines for nausea may be used to treat your symptoms. In rare cases, if your symptoms are caused by certain conditions that affect the inner ear, you may need surgery. HOME CARE INSTRUCTIONS   Follow your caregiver's instructions.  Move slowly. Do not make sudden body or head movements.  Avoid driving.  Avoid operating heavy machinery.  Avoid performing any tasks that would be dangerous to you or others during a vertigo episode.  Drink enough fluids to keep your urine clear or pale yellow. SEEK IMMEDIATE MEDICAL CARE IF:   You develop problems with walking, weakness, numbness, or using your arms, hands, or legs.  You have difficulty speaking.  You develop  severe headaches.  Your nausea or vomiting continues or gets worse.  You develop visual changes.  Your family or friends notice any behavioral changes.  Your condition gets worse.  You have a fever.  You develop a stiff neck or sensitivity to light. MAKE SURE YOU:   Understand these instructions.  Will watch your condition.  Will get help right away if you are not doing well or get worse. Document Released: 01/14/2006 Document Revised: 07/01/2011 Document Reviewed: 12/27/2010 ExitCare Patient Information 2015 [employer]. This information is not intended to replace advice given to you by your health care provider. Make sure you discuss any questions you have with your health care provider.    

## 2014-03-07 MED ORDER — FERROUS SULFATE 325 (65 FE) MG PO TABS: 325.0000 mg | ORAL_TABLET | Freq: Three times a day (TID) | ORAL | Status: DC

## 2014-03-07 NOTE — Progress Notes (Signed)
Rx for Iron sent in (patient to take TID).

## 2014-03-07 NOTE — Addendum Note (Signed)
Addended by: Coral Spikes on: 03/07/2014 04:20 PM   Modules accepted: Orders

## 2014-03-08 ENCOUNTER — Telehealth: Payer: Self-pay | Admitting: *Deleted

## 2014-03-08 NOTE — Telephone Encounter (Signed)
-----   Message from Coral Spikes, DO sent at 03/07/2014  4:20 PM EST ----- Iron increased to TID.  Please inform patient.  Thanks  Dr. Lacinda Axon

## 2014-03-08 NOTE — Telephone Encounter (Signed)
LVM for patient to call back. ?

## 2014-03-08 NOTE — Telephone Encounter (Signed)
Bethany [name] has informed patient for me

## 2014-05-09 ENCOUNTER — Encounter: Payer: Self-pay | Admitting: Family Medicine

## 2014-05-09 ENCOUNTER — Ambulatory Visit (INDEPENDENT_AMBULATORY_CARE_PROVIDER_SITE_OTHER): Payer: PRIVATE HEALTH INSURANCE | Admitting: Family Medicine

## 2014-05-09 ENCOUNTER — Other Ambulatory Visit: Payer: Self-pay | Admitting: Family Medicine

## 2014-05-09 VITALS — BP 97/56 | HR 73 | Temp 98.1°F | Ht 64.0 in | Wt 108.3 lb

## 2014-05-09 DIAGNOSIS — N63 Unspecified lump in unspecified breast: Secondary | ICD-10-CM

## 2014-05-09 DIAGNOSIS — Z0000 Encounter for general adult medical examination without abnormal findings: Secondary | ICD-10-CM

## 2014-05-09 DIAGNOSIS — N920 Excessive and frequent menstruation with regular cycle: Secondary | ICD-10-CM

## 2014-05-09 DIAGNOSIS — R7989 Other specified abnormal findings of blood chemistry: Secondary | ICD-10-CM

## 2014-05-09 DIAGNOSIS — N6320 Unspecified lump in the left breast, unspecified quadrant: Secondary | ICD-10-CM

## 2014-05-09 DIAGNOSIS — Z862 Personal history of diseases of the blood and blood-forming organs and certain disorders involving the immune mechanism: Secondary | ICD-10-CM

## 2014-05-09 DIAGNOSIS — D509 Iron deficiency anemia, unspecified: Secondary | ICD-10-CM

## 2014-05-09 HISTORY — DX: Excessive and frequent menstruation with regular cycle: N92.0

## 2014-05-09 HISTORY — DX: Encounter for general adult medical examination without abnormal findings: Z00.00

## 2014-05-09 LAB — CBC
HCT: 39.9 % (ref 36.0–46.0)
HEMOGLOBIN: 13.2 g/dL (ref 12.0–15.0)
MCH: 29.3 pg (ref 26.0–34.0)
MCHC: 33.1 g/dL (ref 30.0–36.0)
MCV: 88.5 fL (ref 78.0–100.0)
MPV: 11 fL (ref 8.6–12.4)
Platelets: 222 10*3/uL (ref 150–400)
RBC: 4.51 MIL/uL (ref 3.87–5.11)
RDW: 13.7 % (ref 11.5–15.5)
WBC: 3.8 10*3/uL — ABNORMAL LOW (ref 4.0–10.5)

## 2014-05-09 LAB — IRON AND TIBC
%SAT: 20 % (ref 20–55)
Iron: 70 ug/dL (ref 42–145)
TIBC: 345 ug/dL (ref 250–470)
UIBC: 275 ug/dL (ref 125–400)

## 2014-05-09 LAB — FERRITIN: Ferritin: 23 ng/mL (ref 10–291)

## 2014-05-09 LAB — TSH: TSH: 0.492 u[IU]/mL (ref 0.350–4.500)

## 2014-05-09 NOTE — Assessment & Plan Note (Signed)
2 cm x 2 cm Left Breast mass. Likely benign given patient's past medical history and lack of significant family history for breast cancer (maternal aunt is the only family member this been affected by breast cancer). Will send for ultrasound for further evaluation.  If benign, patient to consider surgery (she has had this in the past).

## 2014-05-09 NOTE — Patient Instructions (Addendum)
It was nice to see you today.  I am sending you for a mammogram/ultrasound of your breast.  This is likely benign but it still needs to be addressed.  I will send you the results of your blood tests.  Additionally, I will send in a referral to Ob-Gyn for discussion about hysterectomy.  Take care and follow up in ~ 6 months.

## 2014-05-09 NOTE — Assessment & Plan Note (Signed)
Patient due for pap smear - 09/2014.

## 2014-05-09 NOTE — Assessment & Plan Note (Signed)
She reports long-standing history of menorrhagia. Patient has severe bleeding requiring the use of frequent large pads/depends. We discussed therapeutic options today. Place referral to OB/GYN for consideration for ablation versus hysterectomy.

## 2014-05-09 NOTE — Assessment & Plan Note (Signed)
This appears to be secondary to menorrhagia. CBC and Iron/TIBC/Ferritin today.

## 2014-05-09 NOTE — Progress Notes (Signed)
   Subjective:    Patient ID: Bethany [name], female    DOB: 1975/10/04, 39 y.o.   MRN: [id number]  HPI 39 year old female with a history of iron deficiency anemia and breast cysts presents for her annual physical exam.  Current concerns:  1) Left Breast Mass  Patient noticed a left breast mass 2 weeks ago.  She has had these frequently in the past.  Mass is nontender.   No associated nipple discharge.  No other masses.  No recent weight loss, fevers, chills.  2) Iron deficiency/Menorrhagia  Patient has long-standing history of menorrhagia and subsequent iron deficiency anemia.  Patient reports that she has very heavy menstrual cycles characterized by 7-10 days of severe bleeding.  She uses approximately 8 large pads in a 24-hour period.  Patient would like her hemoglobin and iron checked today.  Patient also notes that all of her children are grown and that she would like to discuss options about treatment of her menorrhagia.  3) Preventative care  Patient in need of flu vaccine.  Patient is up-to-date on Pap smear.  She is due for pap smear in 09/2014.   PMH, Surgical Hx, Family Hx, Social History reviewed and updated as below.  Past Medical History  Diagnosis Date  . History of cervical dysplasia     laser surgery  . Anemia    Past Surgical History  Procedure Laterality Date  . Tubal ligation  2003  . Cesarean section  1995  . Breast fibroadenoma surgery  2011, 2008    benign    Family History  Problem Relation Age of Onset  . Diabetes Maternal Grandmother   . Hypertension Paternal Grandfather   . Stroke Neg Hx   . Depression Neg Hx   . Cancer Maternal Aunt     breast   History   Social History  . Marital Status: Single    Spouse Name: N/A    Number of Children: N/A  . Years of Education: N/A   Social History Main Topics  . Smoking status: Never Smoker   . Smokeless tobacco: Not on file  . Alcohol Use: 0.0 oz/week    0 Glasses of wine per week  .  Drug Use: No  . Sexual Activity: Yes   Other Topics Concern  . Not on file   Social History Narrative   Married, 3 daughters.  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         Review of Systems Per HPI    Objective:   Physical Exam Filed Vitals:   05/09/14 0858  BP: 97/56  Pulse: 73  Temp: 98.1 F (36.7 C)   Exam: General: well appearing thin female in no acute distress. HEENT: NCAT.  Neck: Supple. No thyromegaly or adenopathy. Cardiovascular: RRR. No murmurs, rubs, or gallops. Respiratory: CTAB. No rales, rhonchi, or wheeze. Abdomen: soft, nontender, nondistended. Extremities: No LE edema. Breasts: Right breast - dense, fibrous breast. No mass or adenopathy noted. No nipple discharge.  Left breast - 2 cm x 2 cm mobile, firm left breast mass located under and slightly lateral to the nipple.  Small shotty node noted in the LUQ.  No other masses noted.  No axillary adenopathy.      Assessment & Plan:  See Problem List

## 2014-05-10 ENCOUNTER — Encounter: Payer: Self-pay | Admitting: Obstetrics & Gynecology

## 2014-05-13 ENCOUNTER — Other Ambulatory Visit: Payer: Self-pay

## 2014-05-20 ENCOUNTER — Ambulatory Visit
Admission: RE | Admit: 2014-05-20 | Discharge: 2014-05-20 | Disposition: A | Discharge status: Home / Self Care | Payer: PRIVATE HEALTH INSURANCE | Source: Ambulatory Visit | Attending: Family Medicine | Admitting: Family Medicine

## 2014-05-20 ENCOUNTER — Other Ambulatory Visit: Payer: Self-pay | Admitting: Family Medicine

## 2014-05-20 DIAGNOSIS — N63 Unspecified lump in unspecified breast: Secondary | ICD-10-CM

## 2014-06-20 ENCOUNTER — Encounter: Payer: Self-pay | Admitting: Obstetrics & Gynecology

## 2014-06-20 ENCOUNTER — Ambulatory Visit (INDEPENDENT_AMBULATORY_CARE_PROVIDER_SITE_OTHER): Payer: PRIVATE HEALTH INSURANCE | Admitting: Obstetrics & Gynecology

## 2014-06-20 VITALS — BP 114/61 | HR 69 | Temp 98.4°F | Ht 64.0 in | Wt 106.4 lb

## 2014-06-20 DIAGNOSIS — Z1151 Encounter for screening for human papillomavirus (HPV): Secondary | ICD-10-CM

## 2014-06-20 DIAGNOSIS — N920 Excessive and frequent menstruation with regular cycle: Secondary | ICD-10-CM

## 2014-06-20 DIAGNOSIS — Z124 Encounter for screening for malignant neoplasm of cervix: Secondary | ICD-10-CM

## 2014-06-20 DIAGNOSIS — Z3042 Encounter for surveillance of injectable contraceptive: Secondary | ICD-10-CM

## 2014-06-20 DIAGNOSIS — Z01419 Encounter for gynecological examination (general) (routine) without abnormal findings: Secondary | ICD-10-CM

## 2014-06-20 DIAGNOSIS — Z0000 Encounter for general adult medical examination without abnormal findings: Secondary | ICD-10-CM

## 2014-06-20 MED ORDER — MEDROXYPROGESTERONE ACETATE 150 MG/ML IM SUSP
150.0000 mg | Freq: Once | INTRAMUSCULAR | Status: AC
  Administered 2014-06-20: 150 mg via INTRAMUSCULAR

## 2014-06-20 MED ORDER — MEDROXYPROGESTERONE ACETATE 150 MG/ML IM SUSP: 150.0000 mg | INTRAMUSCULAR | Status: DC

## 2014-06-20 NOTE — Patient Instructions (Signed)

## 2014-06-20 NOTE — Progress Notes (Signed)
Patient ID: Bethany [name], female   DOB: 1975-10-23, 39 y.o.   MRN: [id number]  Chief Complaint  Patient presents with  . Menorrhagia    HPI Bethany [name] is a 39 y.o. female.  [id number] Patient's last menstrual period was 06/17/2014. Heavy menses 12 days regular cycle, s/p BTL HPI  Past Medical History  Diagnosis Date  . History of cervical dysplasia     laser surgery  . Anemia   . Vaginal Pap smear, abnormal     Past Surgical History  Procedure Laterality Date  . Tubal ligation  2003  . Cesarean section  1995  . Breast fibroadenoma surgery  2011, 2008    benign     Family History  Problem Relation Age of Onset  . Diabetes Maternal Grandmother   . Hypertension Paternal Grandfather   . Stroke Neg Hx   . Depression Neg Hx   . Cancer Maternal Aunt     breast    Social History History  Substance Use Topics  . Smoking status: Never Smoker   . Smokeless tobacco: Never Used  . Alcohol Use: No    Allergies  Allergen Reactions  . Codeine Hives  . Oxycodone Hcl Hives    Current Outpatient Prescriptions  Medication Sig Dispense Refill  . ferrous sulfate 325 (65 FE) MG tablet Take 1 tablet (325 mg total) by mouth 3 (three) times daily. 90 tablet 3  . Multiple Vitamin (MULTIVITAMIN) tablet Take 1 tablet by mouth daily.       Current Facility-Administered Medications  Medication Dose Route Frequency Provider Last Rate Last Dose  . medroxyPROGESTERone (DEPO-PROVERA) injection 150 mg  150 mg Intramuscular Q90 days Woodroe Mode, MD        Review of Systems Review of Systems  Constitutional: Positive for fatigue.  Respiratory: Negative.   Genitourinary: Positive for vaginal bleeding and menstrual problem. Negative for vaginal discharge.  Neurological: Positive for light-headedness.    Blood pressure 114/61, pulse 69, temperature 98.4 F (36.9 C), temperature source Oral, height 5\' 4"  (1.626 m), weight 106 lb 6.4 oz (48.263 kg), last menstrual period  06/17/2014.  Physical Exam Physical Exam  Constitutional: She is oriented to person, place, and time. She appears well-developed. No distress.  Cardiovascular: Normal rate.   Pulmonary/Chest: Effort normal.  Abdominal: Soft.  Genitourinary: Vagina normal and uterus normal. No vaginal discharge found.  No mass pap done  Neurological: She is alert and oriented to person, place, and time.  Skin: Skin is warm and dry. No pallor.  Psychiatric: She has a normal mood and affect. Her behavior is normal.    Data Reviewed Notes   Assessment    Menorrhagia s/p BTL     Plan    Wants to try DMPA 150 mg IM and will have pelvic US and RTC 4-6 weeks        ARNOLD,JAMES 06/20/2014, 3:03 PM

## 2014-06-21 ENCOUNTER — Ambulatory Visit (HOSPITAL_COMMUNITY)
Admission: RE | Admit: 2014-06-21 | Discharge: 2014-06-21 | Disposition: A | Discharge status: Home / Self Care | Payer: PRIVATE HEALTH INSURANCE | Source: Ambulatory Visit | Attending: Obstetrics & Gynecology | Admitting: Obstetrics & Gynecology

## 2014-06-21 DIAGNOSIS — N920 Excessive and frequent menstruation with regular cycle: Secondary | ICD-10-CM | POA: Insufficient documentation

## 2014-06-21 LAB — CYTOLOGY - PAP

## 2014-07-03 ENCOUNTER — Other Ambulatory Visit: Payer: Self-pay | Admitting: Family Medicine

## 2014-08-31 ENCOUNTER — Encounter: Payer: Self-pay | Admitting: Family Medicine

## 2014-08-31 ENCOUNTER — Ambulatory Visit (INDEPENDENT_AMBULATORY_CARE_PROVIDER_SITE_OTHER): Payer: PRIVATE HEALTH INSURANCE | Admitting: Family Medicine

## 2014-08-31 VITALS — BP 111/74 | HR 71 | Temp 98.3°F | Ht 64.0 in | Wt 114.2 lb

## 2014-08-31 DIAGNOSIS — F39 Unspecified mood [affective] disorder: Secondary | ICD-10-CM | POA: Diagnosis not present

## 2014-08-31 DIAGNOSIS — N920 Excessive and frequent menstruation with regular cycle: Secondary | ICD-10-CM | POA: Diagnosis not present

## 2014-08-31 NOTE — Assessment & Plan Note (Signed)
>>  ASSESSMENT AND PLAN FOR EPISODIC MOOD DISORDER ([facility]) WRITTEN ON 08/31/2014 10:08 AM BY Kevin FentonBRADSHAW, SAMUEL L, MD  Discussed with her, large anxiety component MDQ screen positive for bipolar d/o- 12 yes's, yes on #2, and moderate PHQ-9 -  16, No SI GAD-7 - 20, very difficult Given likely BPD I would recommend referral to psych Discussed referral to psychiatry and she agrees

## 2014-08-31 NOTE — Progress Notes (Signed)
Patient ID: Bethany [name], female   DOB: March 09, 1976, 39 y.o.   MRN: [id number]   HPI  Patient presents today for anxiety and concern for menopause  Anxiety Patient feels that she's been anxious for several years. Worse over the last 2-3 months. She states that she has inappropriate outbursts of anger and irritability. She states that on a 0-10 mood scale that she has been at a 10 for at least 3 days before. She denies family history of bipolar disorder but does have a family history of depression. She does not have any overt manic episodes past.  Menopause/menorrhagia Being worked up with GYN for menorrhagia. She states that her bleeding has improved but not stopped since starting Depo-Provera about 3 months ago. She now complains of hot flashes and wants to be evaluated for menopause. I provided reassurance and stated that her hot flashes may be due to the Depo-Provera, encouraged her to follow GYN to discuss alternative options.  Smoking status noted ROS: Per HPI  Objective: BP 111/74 mmHg  Pulse 71  Temp(Src) 98.3 F (36.8 C) (Oral)  Ht 5\' 4"  (1.626 m)  Wt 114 lb 3.2 oz (51.801 kg)  BMI 19.59 kg/m2  LMP 06/20/2014 Gen: NAD, alert, cooperative with exam HEENT: NCAT Ext: No edema, warm Neuro: Alert and oriented, No gross deficits Psych: Normal mood and affect, GAD 7, PHQ-9, and MDQ per assessment and plan  Assessment and plan:  Menorrhagia Improved but not resolved with Depo-Provera injection Encouraged follow-up with GYN and discussion of alternative treatments considering her hot flashes.    Episodic mood disorder Discussed with her, large anxiety component MDQ screen positive for bipolar d/o- 12 yes's, yes on #2, and moderate PHQ-9 -  16, No SI GAD-7 - 20, very difficult Given likely BPD I would recommend referral to psych Discussed referral to psychiatry and she agrees     Orders Placed This Encounter  Procedures  . Ambulatory referral to Psychiatry   Referral Priority:  Routine    Referral Type:  Psychiatric    Referral Reason:  Specialty Services Required    Requested Specialty:  Psychiatry    Number of Visits Requested:  1    No orders of the defined types were placed in this encounter.

## 2014-08-31 NOTE — Patient Instructions (Signed)
Great to see you!  You should hear very soon about a psychiatry appointment, call us if you dont (give [name] about 1 week)  PLease follow up with Dr. Roselie Awkward as well, there are lots of options.

## 2014-08-31 NOTE — Assessment & Plan Note (Signed)
Improved but not resolved with Depo-Provera injection Encouraged follow-up with GYN and discussion of alternative treatments considering her hot flashes.

## 2014-08-31 NOTE — Assessment & Plan Note (Signed)
Discussed with her, large anxiety component MDQ screen positive for bipolar d/o- 12 yes's, yes on #2, and moderate PHQ-9 -  16, No SI GAD-7 - 20, very difficult Given likely BPD I would recommend referral to psych Discussed referral to psychiatry and she agrees

## 2014-09-05 ENCOUNTER — Telehealth: Payer: Self-pay

## 2014-09-05 NOTE — Telephone Encounter (Signed)
Pt stated that she has been bleeding off the Depo since February and wants to know what she should do or does she need to try something different before her next appt.

## 2014-09-07 ENCOUNTER — Ambulatory Visit (INDEPENDENT_AMBULATORY_CARE_PROVIDER_SITE_OTHER): Payer: PRIVATE HEALTH INSURANCE

## 2014-09-07 VITALS — BP 113/71 | HR 70

## 2014-09-07 DIAGNOSIS — N920 Excessive and frequent menstruation with regular cycle: Secondary | ICD-10-CM

## 2014-09-07 MED ORDER — MEDROXYPROGESTERONE ACETATE 150 MG/ML IM SUSP
150.0000 mg | Freq: Once | INTRAMUSCULAR | Status: AC
  Administered 2014-09-07: 150 mg via INTRAMUSCULAR

## 2014-09-07 NOTE — Progress Notes (Signed)
Patient here today for depo 150mg  within appropriate time frame. Patient c/o bleeding, saturating 2 maxi pads/day since last admnistration of depo (first dose). Advised patient continue to get depo injection today and that we make next appointment to obtain injection with provider so that she may discuss alternate options-- though did inform her that it may take several doses of depo for body to acclimate and see a relief in symptoms. Informed patient I would speak to Dr. Roselie Awkward regarding possibility of adding Megace or Provera to aid with decreasing bleeding until next appointment. Patient verbalized understanding and gratitude and states this would be very helpful as she is going on vacation to the [place] this Sunday. Informed patient I would call her once hear from Dr. Roselie Awkward. Patient requests she be called on [phone]. Depo 150mg  administered-- patient tolerated well.   Contacted Dr. Roselie Awkward who states after having given second dose today does not think provera or megace would make a difference. Continue to see how second dose helps and no additional medication at this time. Called patient and informed-- verbalized understanding and gratitude. No further questions at this time.

## 2014-10-03 ENCOUNTER — Encounter (HOSPITAL_COMMUNITY): Payer: Self-pay | Admitting: Psychiatry

## 2014-10-03 ENCOUNTER — Ambulatory Visit (INDEPENDENT_AMBULATORY_CARE_PROVIDER_SITE_OTHER): Payer: PRIVATE HEALTH INSURANCE | Admitting: Psychiatry

## 2014-10-03 ENCOUNTER — Encounter (INDEPENDENT_AMBULATORY_CARE_PROVIDER_SITE_OTHER): Payer: Self-pay

## 2014-10-03 VITALS — BP 116/74 | HR 75 | Ht 65.0 in | Wt 113.2 lb

## 2014-10-03 DIAGNOSIS — F3130 Bipolar disorder, current episode depressed, mild or moderate severity, unspecified: Secondary | ICD-10-CM

## 2014-10-03 DIAGNOSIS — F3131 Bipolar disorder, current episode depressed, mild: Secondary | ICD-10-CM

## 2014-10-03 MED ORDER — LAMOTRIGINE 25 MG PO TABS: ORAL_TABLET | ORAL | Status: DC

## 2014-10-03 NOTE — Progress Notes (Signed)
[facility] Behavioral Health Initial Assessment Note  Bethany [name] [id number] 39 y.o.  10/03/2014 1:54 PM  Chief Complaint:  My doctor believe I have bipolar disorder.  History of Present Illness:  Patient is 39 year old African-American married, employed female who is referred from [facility] for assessment.  Patient told her physician believed that she has bipolar disorder.  She fill up the questionnaires and after that she was recommended to see psychiatrist.  She admitted lately irritability, anger, mood swing and poor sleep.  She admitted highs and lows in her mood and some time she is very short with the people.  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  Her major stressors are her husband who she believe cheating on him and makes him very irritable.  Patient has lost her brother 10 years ago when he was killed .  Last 2023/03/20 her daughter's father died during the sleep and it was very difficult.  Patient told usually 03-20-2023 is a difficult month for her.  She admitted poor sleep, racing thoughts, irritable mood, easily distracted and having impulsive behavior.  She admitted buying impulsively and traveling by herself and no reason.  She recently traveled [place] and [place].  She admitted spending too much money and has at least $10,000 in debt .  She also endorse her energy and irritability last for a few days and then she crashed into severe depression when she gets isolated, withdrawn and shut herself.  She also endorses easily emotional , labile and tearful.  Though she denies any suicidal thoughts or homicidal thought but admitted feeling hopeless, worthless and tearful.  She has never seen a psychiatrist before.  She denies any hallucination but admitted she talks to the God when she is under a lot of stress.  She denies any paranoia or any aggressive behavior.  She also endorse excessive eating when she is under a lot of stress.  She believe food is soothing for  her.  She endorse her weight fluctuates a lot.  She was given Depo shot this February because she was having a lot of bleeding and she has noticed her symptoms started to get worse since then.  Currently she is not taking any medication but she is open to try medication and counseling.  Patient denies drinking or using any illegal substances.  Patient denies any nightmares, flashback or any anxiety attack.  She denies any OCD symptoms or any PTSD symptoms.  Suicidal Ideation: No Plan Formed: No Patient has means to carry out plan: No  Homicidal Ideation: No Plan Formed: No Patient has means to carry out plan: No  Past Psychiatric History/Hospitalization(s): Patient members seeing a therapist when she was in [facility] after her brother killed.  She was going through grief and she was given Zoloft and lorazepam by her primary care physician.  Patient denies any history of inpatient psychiatric treatment, suicidal attempt, psychosis or any hallucination.  She admitted mood swing, anger, irritability and manic-like symptoms in the past. Anxiety: No Bipolar Disorder: Yes Depression: Yes Mania: Yes Psychosis: No Schizophrenia: No Personality Disorder: No Hospitalization for psychiatric illness: No History of Electroconvulsive Shock Therapy: No Prior Suicide Attempts: No  Medical History; Patient has dysfunctional uterine bleeding.  History of cervical dysplasia, abnormal Pap smear and anemia.  Her primary care physician is family practice at [facility].  Patient denies any history of seizures.  Traumatic brain injury: Patient denies any history of traumatic brain injury.  Family History; Patient endorse father has alcoholism and depression.  Education and Work History; Patient is high school graduate and she is working as a Quarry manager in a nursing home.    Psychosocial History; Patient born and raised in [place].  She was raised by her mother and a stepfather.  She has one sibling who  was killed 10 years ago.  She was very close to him.  Patient married 4 times and she was also involved in another relationship.  She has 3 children.  She has 6 year old and 14 year old from her first marriage.  She has 41 year old daughter, whose father died in 03-08-14.  Patient is married to her current husband since 2011.  Patient told her husband has been involving cheating and she admitted do not get along with him very well.  Her husband is a Administrator.  Patient is close to her mother and stepfather who live close by.  Patient is working as a Quarry manager in nursing home.  Legal History; Patient denies any legal issues.  History Of Abuse; Patient denies any history of abuse.  Substance Abuse History; Patient admitted history of drinking in the past but denies any current drinking or using any illegal substance use.  Review of Systems  Constitutional: Positive for malaise/fatigue.  Cardiovascular: Negative for chest pain and palpitations.  Skin: Negative for itching and rash.  Neurological: Negative for dizziness and tremors.    Psychiatric: Agitation: Yes Hallucination: No Depressed Mood: Yes Insomnia: Yes Hypersomnia: No Altered Concentration: No Feels Worthless: No Grandiose Ideas: No Belief In Special Powers: No New/Increased Substance Abuse: No Compulsions: No  Neurologic: Headache: No Seizure: No Paresthesias: No   Outpatient Encounter Prescriptions as of 10/03/2014  Medication Sig  . ferrous sulfate 325 (65 FE) MG tablet TAKE 1 TABLET (325 MG TOTAL) BY MOUTH 3 (THREE) TIMES DAILY.  Marland Kitchen lamoTRIgine (LAMICTAL) 25 MG tablet Take 1 tab daily for 1 week and than 2 tab daily  . Multiple Vitamin (MULTIVITAMIN) tablet Take 1 tablet by mouth daily.     No facility-administered encounter medications on file as of 10/03/2014.    No results found for this or any previous visit (from the past 2160 hour(s)).    Constitutional:  BP 116/74 mmHg  Pulse 75  Ht 5\' 5"  (1.651  m)  Wt 113 lb 3.2 oz (51.347 kg)  BMI 18.84 kg/m2  LMP 06/20/2014   Musculoskeletal: Strength & Muscle Tone: within normal limits Gait & Station: normal Patient leans: N/A  Psychiatric Specialty Exam: General Appearance: Casual  Eye Contact::  Fair  Speech:  Normal Rate  Volume:  Normal  Mood:  Anxious  Affect:  Congruent  Thought Process:  Goal Directed  Orientation:  Full (Time, Place, and Person)  Thought Content:  WDL  Suicidal Thoughts:  No  Homicidal Thoughts:  No  Memory:  Immediate;   Fair Recent;   Fair Remote;   Fair  Judgement:  Intact  Insight:  Fair  Psychomotor Activity:  Normal  Concentration:  Fair  Recall:  [place] of Knowledge:  Good  Language:  Good  Akathisia:  No  Handed:  Right  AIMS (if indicated):     Assets:  Communication Skills Desire for Improvement Financial Resources/Insurance Housing Physical Health Social Support Transportation  ADL's:  Intact  Cognition:  WNL  Sleep:        New problem, with additional work up planned, Review of Psycho-Social Stressors (1), Review or order clinical lab tests (1), Decision  to obtain old records (1), Review and summation of old records (2), New Problem, with no additional work-up planned (3), Review of Medication Regimen & Side Effects (2) and Review of New Medication or Change in Dosage (2)  Assessment: Axis I: Bipolar disorder depressed type  Axis II: Deferred  Axis III:  Past Medical History  Diagnosis Date  . History of cervical dysplasia     laser surgery  . Anemia   . Vaginal Pap smear, abnormal   . Anxiety   . Depression      Plan:  I review her symptoms, history, collateral information, current medication and recent blood work results.  We will start Lamictal 25 mg daily and gradually increase to 50 mg after 1 week.  I will also refer her for counseling for coping skills.  Discussed medication side effects and benefits specialty Lamictal can cause rash in that case she  needed to stop the medication immediately.  Discuss sleep hygiene and weight maintenance.  Recommended to call us back if she has any question, concern if she feels worsening of the symptom.  Follow-up in 3 weeks. Time spent 55 minutes.  More than 50% of the time spent in psychoeducation, counseling and coordination of care.  Discuss safety plan that anytime having active suicidal thoughts or homicidal thoughts then patient need to call 911 or go to the local emergency room.    Sora Olivo T., MD 10/03/2014

## 2014-10-24 ENCOUNTER — Emergency Department (INDEPENDENT_AMBULATORY_CARE_PROVIDER_SITE_OTHER)
Admission: EM | Admit: 2014-10-24 | Discharge: 2014-10-24 | Disposition: A | Discharge status: Home / Self Care | Payer: Worker's Compensation | Source: Home / Self Care | Attending: Family Medicine | Admitting: Family Medicine

## 2014-10-24 ENCOUNTER — Encounter (HOSPITAL_COMMUNITY): Payer: Self-pay | Admitting: Emergency Medicine

## 2014-10-24 DIAGNOSIS — M545 Low back pain, unspecified: Secondary | ICD-10-CM

## 2014-10-24 DIAGNOSIS — S39012A Strain of muscle, fascia and tendon of lower back, initial encounter: Secondary | ICD-10-CM | POA: Diagnosis not present

## 2014-10-24 DIAGNOSIS — S29019A Strain of muscle and tendon of unspecified wall of thorax, initial encounter: Secondary | ICD-10-CM | POA: Diagnosis not present

## 2014-10-24 DIAGNOSIS — S29012A Strain of muscle and tendon of back wall of thorax, initial encounter: Secondary | ICD-10-CM

## 2014-10-24 MED ORDER — CYCLOBENZAPRINE HCL 5 MG PO TABS
5.0000 mg | ORAL_TABLET | Freq: Three times a day (TID) | ORAL | Status: DC | PRN
Start: 2014-10-24 — End: 2014-12-27

## 2014-10-24 MED ORDER — DICLOFENAC POTASSIUM 50 MG PO TABS: 50.0000 mg | ORAL_TABLET | Freq: Three times a day (TID) | ORAL | Status: DC

## 2014-10-24 NOTE — ED Provider Notes (Signed)
CSN: [id number]     Arrival date & time 10/24/14  1643 History   First MD Initiated Contact with Patient 10/24/14 1840     Chief Complaint  Patient presents with  . Back Pain  . 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 and injured her right mid and lower back. Due the night and today at work the pain in her back became worse. It is worse with bending and pulling, ambulation and other movements of the back. Denies radiation of pain to the buttock or leg. No radiculopathy. No focal paresthesias or weakness.   Past Medical History  Diagnosis Date  . History of cervical dysplasia     laser surgery  . Anemia   . Vaginal Pap smear, abnormal   . Anxiety   . Depression    Past Surgical History  Procedure Laterality Date  . Tubal ligation  2003  . Cesarean section  1995  . Breast fibroadenoma surgery  2011, 2008    benign    Family History  Problem Relation Age of Onset  . Diabetes Maternal Grandmother   . Hypertension Paternal Grandfather   . Stroke Neg Hx   . Cancer Maternal Aunt     breast  . Anxiety disorder Father   . Depression Father   . Alcohol abuse Maternal Uncle   . Alcohol abuse Cousin   . Drug abuse Cousin    History  Substance Use Topics  . Smoking status: Never Smoker   . Smokeless tobacco: Never Used  . Alcohol Use: No   OB History    Gravida Para Term Preterm AB TAB SAB Ectopic Multiple Living   3 3 3       6      Review of Systems  Constitutional: Negative.  Negative for fever, chills and activity change.  HENT: Negative.   Respiratory: Negative.   Cardiovascular: Negative.   Genitourinary: Negative.   Musculoskeletal:       As per HPI  Skin: Negative for color change, pallor and rash.  Neurological: Negative.     Allergies  Codeine and Oxycodone hcl  Home Medications   Prior to Admission  medications   Medication Sig Start Date End Date Taking? Authorizing Provider  cyclobenzaprine (FLEXERIL) 5 MG tablet Take 1 tablet (5 mg total) by mouth 3 (three) times daily as needed for muscle spasms. 10/24/14   Janne Napoleon, NP  diclofenac (CATAFLAM) 50 MG tablet Take 1 tablet (50 mg total) by mouth 3 (three) times daily. One tablet TID with food prn pain. 10/24/14   Janne Napoleon, NP  ferrous sulfate 325 (65 FE) MG tablet TAKE 1 TABLET (325 MG TOTAL) BY MOUTH 3 (THREE) TIMES DAILY. 07/04/14   Coral Spikes, DO  lamoTRIgine (LAMICTAL) 25 MG tablet Take 1 tab daily for 1 week and than 2 tab daily 10/03/14   [name], MD  Multiple Vitamin (MULTIVITAMIN) tablet Take 1 tablet by mouth daily.      Historical Provider, MD   BP 123/82 mmHg  Pulse 70  Temp(Src) 98.3 F (36.8 C) (Oral)  Resp 12  SpO2 100%  LMP 10/19/2014 Physical Exam  Constitutional: She is oriented to person, place, and time. She appears well-developed and well-nourished. No distress.  Neck: Normal range of motion. Neck supple.  Cardiovascular: Normal rate.   Pulmonary/Chest: Effort normal. No respiratory distress.  Musculoskeletal: She exhibits no edema.  Tenderness to  the right lower parathoracic and paralumbar musculature. Mild tenderness to the upper lumbar spine. No palpable deformity. No swelling or discoloration. Forward flexion to 30 before pain. Bilateral flexion is intact but with pain at 30 to the left. Lower extremity strength 5 out 5 symmetric.  Neurological: She is alert and oriented to person, place, and time.  Skin: Skin is warm.  Psychiatric: She has a normal mood and affect.  Nursing note and vitals reviewed.   ED Course  Procedures (including critical care time) Labs Review Labs Reviewed - No data to display  Imaging Review No results found.   MDM   1. Right-sided low back pain without sciatica   2. Lumbar strain, initial encounter   3. Strain of thoracic paraspinal muscles excluding T1 and T2  levels, initial encounter    Flexeril 5 mg  cataflam 50 mg  Heat, stretches as demo'd    Janne Napoleon, NP 10/24/14 1913

## 2014-10-24 NOTE — ED Notes (Signed)
Right lower back pain, no pain in buttocks or leg. 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

## 2014-10-24 NOTE — Discharge Instructions (Signed)
Back Pain, Adult °Low back pain is very common. About 1 in 5 people have back pain. The cause of low back pain is rarely dangerous. The pain often gets better over time. About half of people with a sudden onset of back pain feel better in just 2 weeks. About 8 in 10 people feel better by 6 weeks.  °CAUSES °Some common causes of back pain include: °· Strain of the muscles or ligaments supporting the spine. °· Wear and tear (degeneration) of the spinal discs. °· Arthritis. °· Direct injury to the back. °DIAGNOSIS °Most of the time, the direct cause of low back pain is not known. However, back pain can be treated effectively even when the exact cause of the pain is unknown. Answering your caregiver's questions about your overall health and symptoms is one of the most accurate ways to make sure the cause of your pain is not dangerous. If your caregiver needs more information, he or she may order lab work or imaging tests (X-rays or MRIs). However, even if imaging tests show changes in your back, this usually does not require surgery. °HOME CARE INSTRUCTIONS °For many people, back pain returns. Since low back pain is rarely dangerous, it is often a condition that people can learn to manage on their own.  °· Remain active. It is stressful on the back to sit or stand in one place. Do not sit, drive, or stand in one place for more than 30 minutes at a time. Take short walks on level surfaces as soon as pain allows. Try to increase the length of time you walk each day. °· Do not stay in bed. Resting more than 1 or 2 days can delay your recovery. °· Do not avoid exercise or work. Your body is made to move. It is not dangerous to be active, even though your back may hurt. Your back will likely heal faster if you return to being active before your pain is gone. °· Pay attention to your body when you  bend and lift. Many people have less discomfort when lifting if they bend their knees, keep the load close to their bodies, and  avoid twisting. Often, the most comfortable positions are those that put less stress on your recovering back. °· Find a comfortable position to sleep. Use a firm mattress and lie on your side with your knees slightly bent. If you lie on your back, put a pillow under your knees. °· Only take over-the-counter or prescription medicines as directed by your caregiver. Over-the-counter medicines to reduce pain and inflammation are often the most helpful. Your caregiver may prescribe muscle relaxant drugs. These medicines help dull your pain so you can more quickly return to your normal activities and healthy exercise. °· Put ice on the injured area. °¨ Put ice in a plastic bag. °¨ Place a towel between your skin and the bag. °¨ Leave the ice on for 15-20 minutes, 03-04 times a day for the first 2 to 3 days. After that, ice and heat may be alternated to reduce pain and spasms. °· Ask your caregiver about trying back exercises and gentle massage. This may be of some benefit. °· Avoid feeling anxious or stressed. Stress increases muscle tension and can worsen back pain. It is important to recognize when you are anxious or stressed and learn ways to manage it. Exercise is a great option. °SEEK MEDICAL CARE IF: °· You have pain that is not relieved with rest or medicine. °· You have pain that does not improve in 1 week. °· You have new symptoms. °· You are generally not feeling well. °SEEK   IMMEDIATE MEDICAL CARE IF:   You have pain that radiates from your back into your legs.  You develop new bowel or bladder control problems.  You have unusual weakness or numbness in your arms or legs.  You develop nausea or vomiting.  You develop abdominal pain.  You feel faint. Document Released: 04/08/2005 Document Revised: 10/08/2011 Document Reviewed: 08/10/2013 [facility] Patient Information 2015 [place], [place]. This information is not intended to replace advice given to you by your health care provider. Make sure you  discuss any questions you have with your health care provider.  Muscle Strain A muscle strain is an injury that occurs when a muscle is stretched beyond its normal length. Usually a small number of muscle fibers are torn when this happens. Muscle strain is rated in degrees. First-degree strains have the least amount of muscle fiber tearing and pain. Second-degree and third-degree strains have increasingly more tearing and pain.  Usually, recovery from muscle strain takes 1-2 weeks. Complete healing takes 5-6 weeks.  CAUSES  Muscle strain happens when a sudden, violent force placed on a muscle stretches it too far. This may occur with lifting, sports, or a fall.  RISK FACTORS Muscle strain is especially common in athletes.  SIGNS AND SYMPTOMS At the site of the muscle strain, there may be:  Pain.  Bruising.  Swelling.  Difficulty using the muscle due to pain or lack of normal function. DIAGNOSIS  Your health care provider will perform a physical exam and ask about your medical history. TREATMENT  Often, the best treatment for a muscle strain is resting, icing, and applying cold compresses to the injured area.  HOME CARE INSTRUCTIONS   Use the PRICE method of treatment to promote muscle healing during the first 2-3 days after your injury. The PRICE method involves:  Protecting the muscle from being injured again.  Restricting your activity and resting the injured body part.  Icing your injury. To do this, put ice in a plastic bag. Place a towel between your skin and the bag. Then, apply the ice and leave it on from 15-20 minutes each hour. After the third day, switch to moist heat packs.  Apply compression to the injured area with a splint or elastic bandage. Be careful not to wrap it too tightly. This may interfere with blood circulation or increase swelling.  Elevate the injured body part above the level of your heart as often as you can.  Only take over-the-counter or  prescription medicines for pain, discomfort, or fever as directed by your health care provider.  Warming up prior to exercise helps to prevent future muscle strains. SEEK MEDICAL CARE IF:   You have increasing pain or swelling in the injured area.  You have numbness, tingling, or a significant loss of strength in the injured area. MAKE SURE YOU:   Understand these instructions.  Will watch your condition.  Will get help right away if you are not doing well or get worse. Document Released: 04/08/2005 Document Revised: 01/27/2013 Document Reviewed: 11/05/2012 [facility] Patient Information 2015 [place], [place]. This information is not intended to replace advice given to you by your health care provider. Make sure you discuss any questions you have with your health care provider.  Lumbosacral Strain Lumbosacral strain is a strain of any of the parts that make up your lumbosacral vertebrae. Your lumbosacral vertebrae are the bones that make up the lower third of your backbone. Your lumbosacral vertebrae are held together by muscles and tough, fibrous tissue (ligaments).  CAUSES  A sudden blow to your back can cause lumbosacral strain. Also, anything that causes an excessive stretch of the muscles in the low back can cause this strain. This is typically seen when people exert themselves strenuously, fall, lift heavy objects, bend, or crouch repeatedly. RISK FACTORS  Physically demanding work.  Participation in pushing or pulling sports or sports that require a sudden twist of the back (tennis, golf, baseball).  Weight lifting.  Excessive lower back curvature.  Forward-tilted pelvis.  Weak back or abdominal muscles or both.  Tight hamstrings. SIGNS AND SYMPTOMS  Lumbosacral strain may cause pain in the area of your injury or pain that moves (radiates) down your leg.  DIAGNOSIS Your health care provider can often diagnose lumbosacral strain through a physical exam. In some cases, you  may need tests such as X-ray exams.  TREATMENT  Treatment for your lower back injury depends on many factors that your clinician will have to evaluate. However, most treatment will include the use of anti-inflammatory medicines. HOME CARE INSTRUCTIONS   Avoid hard physical activities (tennis, racquetball, waterskiing) if you are not in proper physical condition for it. This may aggravate or create problems.  If you have a back problem, avoid sports requiring sudden body movements. Swimming and walking are generally safer activities.  Maintain good posture.  Maintain a healthy weight.  For acute conditions, you may put ice on the injured area.  Put ice in a plastic bag.  Place a towel between your skin and the bag.  Leave the ice on for 20 minutes, 2-3 times a day.  When the low back starts healing, stretching and strengthening exercises may be recommended. SEEK MEDICAL CARE IF:  Your back pain is getting worse.  You experience severe back pain not relieved with medicines. SEEK IMMEDIATE MEDICAL CARE IF:   You have numbness, tingling, weakness, or problems with the use of your arms or legs.  There is a change in bowel or bladder control.  You have increasing pain in any area of the body, including your belly (abdomen).  You notice shortness of breath, dizziness, or feel faint.  You feel sick to your stomach (nauseous), are throwing up (vomiting), or become sweaty.  You notice discoloration of your toes or legs, or your feet get very cold. MAKE SURE YOU:   Understand these instructions.  Will watch your condition.  Will get help right away if you are not doing well or get worse. Document Released: 01/16/2005 Document Revised: 04/13/2013 Document Reviewed: 11/25/2012 [facility] Patient Information 2015 [place], [place]. This information is not intended to replace advice given to you by your health care provider. Make sure you discuss any questions you have with your health  care provider.

## 2014-10-27 ENCOUNTER — Other Ambulatory Visit (HOSPITAL_COMMUNITY): Payer: Self-pay | Admitting: Psychiatry

## 2014-10-27 DIAGNOSIS — F3131 Bipolar disorder, current episode depressed, mild: Secondary | ICD-10-CM

## 2014-10-27 NOTE — Telephone Encounter (Signed)
Met with Dr. Lovena Le to discuss patient's recent start on Lamictal and how Dr. Adele Schilder was tapering up.  Discussed what dosage she should remain on until returns to see Dr. Adele Schilder on 11/23/14.  New order e-scribed to patient's pharmacy with instructions to take Lamictal 78m, 2 daily (50 mg total), #60 with no refills until returns per Dr. TLovena Leinstruction.  Patient to return for next evaluation on 11/23/14.

## 2014-11-07 ENCOUNTER — Ambulatory Visit (HOSPITAL_COMMUNITY): Payer: Self-pay | Admitting: Clinical

## 2014-11-23 ENCOUNTER — Ambulatory Visit (INDEPENDENT_AMBULATORY_CARE_PROVIDER_SITE_OTHER): Payer: PRIVATE HEALTH INSURANCE | Admitting: Psychiatry

## 2014-11-23 ENCOUNTER — Encounter (HOSPITAL_COMMUNITY): Payer: Self-pay | Admitting: Psychiatry

## 2014-11-23 VITALS — BP 115/69 | HR 71 | Ht 64.0 in | Wt 118.6 lb

## 2014-11-23 DIAGNOSIS — F3131 Bipolar disorder, current episode depressed, mild: Secondary | ICD-10-CM | POA: Diagnosis not present

## 2014-11-23 MED ORDER — LAMOTRIGINE 25 MG PO TABS: 100.0000 mg | ORAL_TABLET | Freq: Every day | ORAL | Status: DC

## 2014-11-23 NOTE — Progress Notes (Signed)
[facility] Progress Note  Bethany [name] [id number] 39 y.o.  11/23/2014 10:47 AM  Chief Complaint:  I'm taking the medication but do not see a lot of improvement.  I still have a lot of irritability.    History of Present Illness:  Bethany [name] came for her follow-up appointment.  She is 39 year old African-American married employed female who was seen first time on June 13.  She was given Lamictal 25 mg and now she is taking 50 mg daily.  She continued to endorse irritability, anger, mood swing and poor sleep.  She admitted highs and lows and having short temper with the people.  Recently she was sad because she has attended 2 funeral back-to-back.  Patient told her husband's father and stepmother died 4 days apart.  She continued to have trust issue on her husband who she believed that he is cheating.  She admitted impulsive eating and she has gained 5 pounds since the last visit.  Though she has been not involved in spending too much money but she still have urges and impulsive behavior.  Patient is scheduled to see therapist on 17th.  Patient admitted missed last appointment because her car broke down.  Patient was also seen in the emergency room a few weeks ago because of acting.  She is taking pain medication however she still have back pain issues.  Though she denies any suicidal thoughts or homicidal thought but endorsed socially withdrawn, isolated and tearful.  She continues to have uterine bleeding and she is taking iron pills.  Patient denies any rash or itching with the Lamictal.  She like to go up on the dose.  Patient denies drinking or using any illegal substances.  She lives with her husband.  She has 3 children.  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  Suicidal Ideation: No Plan Formed: No Patient has means to carry out plan: No  Homicidal Ideation: No Plan Formed: No Patient has means to carry out plan: No  Past Psychiatric History/Hospitalization(s): Patient  members seeing a therapist when she was in [facility] after her brother killed.  She was going through grief and she was given Zoloft and lorazepam by her primary care physician.  Patient denies any history of inpatient psychiatric treatment, suicidal attempt, psychosis or any hallucination.  She admitted mood swing, anger, irritability and manic-like symptoms in the past. Anxiety: No Bipolar Disorder: Yes Depression: Yes Mania: Yes Psychosis: No Schizophrenia: No Personality Disorder: No Hospitalization for psychiatric illness: No History of Electroconvulsive Shock Therapy: No Prior Suicide Attempts: No  Medical History; Patient has dysfunctional uterine bleeding.  History of cervical dysplasia, abnormal Pap smear and anemia.  Her primary care physician is family practice at [facility].  Patient denies any history of seizures.   Review of Systems  Constitutional: Positive for malaise/fatigue. Negative for weight loss.  Cardiovascular: Negative for chest pain and palpitations.  Musculoskeletal: Positive for back pain.  Skin: Negative for itching and rash.  Neurological: Negative for dizziness, tremors and headaches.  Psychiatric/Behavioral: Negative for suicidal ideas. The patient has insomnia.     Psychiatric: Agitation: Yes Hallucination: No Depressed Mood: Yes Insomnia: Yes Hypersomnia: No Altered Concentration: No Feels Worthless: No Grandiose Ideas: No Belief In Special Powers: No New/Increased Substance Abuse: No Compulsions: No  Neurologic: Headache: No Seizure: No Paresthesias: No   Outpatient Encounter Prescriptions as of 11/23/2014  Medication Sig  . cyclobenzaprine (FLEXERIL) 5 MG tablet Take 1 tablet (5 mg total) by mouth  3 (three) times daily as needed for muscle spasms.  . diclofenac (CATAFLAM) 50 MG tablet Take 1 tablet (50 mg total) by mouth 3 (three) times daily. One tablet TID with food prn pain.  . ferrous sulfate 325 (65 FE) MG tablet TAKE 1  TABLET (325 MG TOTAL) BY MOUTH 3 (THREE) TIMES DAILY.  Marland Kitchen lamoTRIgine (LAMICTAL) 25 MG tablet Take 4 tablets (100 mg total) by mouth daily.  . Multiple Vitamin (MULTIVITAMIN) tablet Take 1 tablet by mouth daily.    . [DISCONTINUED] lamoTRIgine (LAMICTAL) 25 MG tablet Take 2 tablets (50 mg total) by mouth daily.   No facility-administered encounter medications on file as of 11/23/2014.    No results found for this or any previous visit (from the past 2160 hour(s)).    Constitutional:  BP 115/69 mmHg  Pulse 71  Ht 5\' 4"  (1.626 m)  Wt 118 lb 9.6 oz (53.797 kg)  BMI 20.35 kg/m2  LMP 10/19/2014   Musculoskeletal: Strength & Muscle Tone: within normal limits Gait & Station: normal Patient leans: N/A  Psychiatric Specialty Exam: General Appearance: Casual  Eye Contact::  Fair  Speech:  Normal Rate  Volume:  Normal  Mood:  Anxious  Affect:  Congruent  Thought Process:  Goal Directed  Orientation:  Full (Time, Place, and Person)  Thought Content:  WDL  Suicidal Thoughts:  No  Homicidal Thoughts:  No  Memory:  Immediate;   Fair Recent;   Fair Remote;   Fair  Judgement:  Intact  Insight:  Fair  Psychomotor Activity:  Normal  Concentration:  Fair  Recall:  [place] of Knowledge:  Good  Language:  Good  Akathisia:  No  Handed:  Right  AIMS (if indicated):     Assets:  Communication Skills Desire for Improvement Financial Resources/Insurance Housing Physical Health Social Support Transportation  ADL's:  Intact  Cognition:  WNL  Sleep:        Established Problem, Stable/Improving (1), Review of Psycho-Social Stressors (1), Review and summation of old records (2), Review of Last Therapy Session (1), Review of Medication Regimen & Side Effects (2) and Review of New Medication or Change in Dosage (2)  Assessment: Axis I: Bipolar disorder depressed type  Axis II: Deferred  Axis III:  Past Medical History  Diagnosis Date  . History of cervical dysplasia     laser  surgery  . Anemia   . Vaginal Pap smear, abnormal   . Anxiety   . Depression      Plan:  I reviewed records from emergency room and her current medication.  Patient denies any side effects of current medication.  Increase Lamictal 100 mg daily since patient does not have any side effects.  Encouraged to keep appointment with Joaquim Lai for coping and social skills.  Discuss sleep hygiene and weight maintenance.  Discuss breathing exercise and relaxation technique.  Recommended to call us back if she has any question, concern if she feels worsening of the symptom.  Discuss safety plan that anytime having active suicidal thoughts or homicidal thoughts and she need to call 911 or go to the local emergency room.  Follow-up in 4 weeks.  ARFEEN,SYED T., MD 11/23/2014

## 2014-11-25 ENCOUNTER — Ambulatory Visit (INDEPENDENT_AMBULATORY_CARE_PROVIDER_SITE_OTHER): Payer: PRIVATE HEALTH INSURANCE | Admitting: *Deleted

## 2014-11-25 DIAGNOSIS — N938 Other specified abnormal uterine and vaginal bleeding: Secondary | ICD-10-CM

## 2014-11-25 MED ORDER — MEDROXYPROGESTERONE ACETATE 150 MG/ML IM SUSP
150.0000 mg | Freq: Once | INTRAMUSCULAR | Status: AC
  Administered 2014-11-25: 150 mg via INTRAMUSCULAR

## 2014-12-01 ENCOUNTER — Other Ambulatory Visit: Payer: Self-pay | Admitting: *Deleted

## 2014-12-02 MED ORDER — FERROUS SULFATE 325 (65 FE) MG PO TABS: ORAL_TABLET | ORAL | Status: DC

## 2014-12-02 NOTE — Telephone Encounter (Signed)
Refilled patient's Rx for Fe.

## 2014-12-10 ENCOUNTER — Other Ambulatory Visit (HOSPITAL_COMMUNITY): Payer: Self-pay | Admitting: Psychiatry

## 2014-12-14 ENCOUNTER — Telehealth (HOSPITAL_COMMUNITY): Payer: Self-pay

## 2014-12-14 ENCOUNTER — Ambulatory Visit (INDEPENDENT_AMBULATORY_CARE_PROVIDER_SITE_OTHER): Payer: PRIVATE HEALTH INSURANCE | Admitting: Clinical

## 2014-12-14 ENCOUNTER — Encounter (HOSPITAL_COMMUNITY): Payer: Self-pay | Admitting: Clinical

## 2014-12-14 DIAGNOSIS — F4321 Adjustment disorder with depressed mood: Secondary | ICD-10-CM

## 2014-12-14 DIAGNOSIS — F3112 Bipolar disorder, current episode manic without psychotic features, moderate: Secondary | ICD-10-CM | POA: Diagnosis not present
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
## 2014-12-14 NOTE — Telephone Encounter (Signed)
Medication problem - Met with patient after her therapy appointment with Audelia Acton today as patient reported she developed a rash and itching around her neck approximately a week prior and concerned due to Lamictal. Patient reported she stopped the medication approximately 4 days ago due to running out and the rash and itching were starting to improve.  Met with Dr. Adele Schilder who ordered patient to stop the medication and to keep appointment set for 12/27/14 to discuss other options.  Informed patient she would need to not restart the medication as was discontinued by Dr. Adele Schilder and he will see her on 12/27/14 to evaluate what else may be used to assist with her Bipolar disorder.  Patient agreed with plan, understands not to restart or take any more Lamictal and will call if any other problems.

## 2014-12-20 ENCOUNTER — Ambulatory Visit (INDEPENDENT_AMBULATORY_CARE_PROVIDER_SITE_OTHER): Payer: PRIVATE HEALTH INSURANCE | Admitting: Family Medicine

## 2014-12-20 ENCOUNTER — Encounter: Payer: Self-pay | Admitting: Family Medicine

## 2014-12-20 VITALS — BP 109/73 | HR 63 | Temp 98.3°F | Ht 64.0 in | Wt 118.6 lb

## 2014-12-20 DIAGNOSIS — R109 Unspecified abdominal pain: Secondary | ICD-10-CM | POA: Diagnosis not present

## 2014-12-20 DIAGNOSIS — M545 Low back pain, unspecified: Secondary | ICD-10-CM

## 2014-12-20 LAB — POCT URINALYSIS DIPSTICK
Bilirubin, UA: NEGATIVE
Glucose, UA: NEGATIVE
Ketones, UA: NEGATIVE
Leukocytes, UA: NEGATIVE
NITRITE UA: NEGATIVE
PROTEIN UA: NEGATIVE
Spec Grav, UA: 1.02
UROBILINOGEN UA: 0.2
pH, UA: 6

## 2014-12-20 LAB — POCT UA - MICROSCOPIC ONLY

## 2014-12-20 NOTE — Progress Notes (Signed)
Subjective:     Patient ID: Bethany [name], female   DOB: 04/01/1976, 39 y.o.   MRN: [id number]  HPI Mrs. Bethany [name] is a 39yo female presenting today with concerns for possible bladder infection. - Reports pain in her left flank x1 month - Has been drinking plenty of Cranberry Juice and Water to help. Has not taken any pain medications since pain is tolerable. - Sleeps on left side and wonders if this may be contributing to pain - Pain is worse in the morning and seems to improve during the day - Denies fevers, dysuria, increased frequency, increased urgency, difficulty urinating - Complicated by menorrhagia. States she is always on her period, however this has improved significantly with Depo shots. Before shots had to wear depends, but now able to wear panty liners. - Denies history of renal stones  Review of Systems  Per HPI    Objective:   Physical Exam  Constitutional: She appears well-developed and well-nourished. No distress.  Cardiovascular: Normal rate and regular rhythm.  Exam reveals no gallop and no friction rub.   No murmur heard. Pulmonary/Chest: Effort normal. No respiratory distress. She has no wheezes. She has no rales.  Abdominal: Soft. Bowel sounds are normal. She exhibits no distension. There is no tenderness.  Left CVA tenderness      Assessment and Plan:     Left flank pain - Urinalysis with hematuria. History of menorrhagia and states she is always on period. - Exam and urinalysis concerning for kidney stone - Will obtain CT renal stone study - States pain is manageable with OTC pain medications and does not wish to have additional medication at this time.  - Follow up or go to ED if pain worsens

## 2014-12-20 NOTE — Patient Instructions (Signed)

## 2014-12-21 DIAGNOSIS — R10A2 Flank pain, left side: Secondary | ICD-10-CM | POA: Insufficient documentation

## 2014-12-21 DIAGNOSIS — R109 Unspecified abdominal pain: Secondary | ICD-10-CM | POA: Insufficient documentation

## 2014-12-21 NOTE — Assessment & Plan Note (Signed)
-   Urinalysis with hematuria. History of menorrhagia and states she is always on period. - Exam and urinalysis concerning for kidney stone - Will obtain CT renal stone study - States pain is manageable with OTC pain medications and does not wish to have additional medication at this time.  - Follow up or go to ED if pain worsens

## 2014-12-22 ENCOUNTER — Ambulatory Visit (HOSPITAL_COMMUNITY)
Admission: RE | Admit: 2014-12-22 | Discharge: 2014-12-22 | Disposition: A | Discharge status: Home / Self Care | Payer: PRIVATE HEALTH INSURANCE | Source: Ambulatory Visit | Attending: Family Medicine | Admitting: Family Medicine

## 2014-12-22 ENCOUNTER — Encounter (HOSPITAL_COMMUNITY): Payer: Self-pay

## 2014-12-22 DIAGNOSIS — R109 Unspecified abdominal pain: Secondary | ICD-10-CM | POA: Diagnosis not present

## 2014-12-27 ENCOUNTER — Ambulatory Visit (INDEPENDENT_AMBULATORY_CARE_PROVIDER_SITE_OTHER): Payer: PRIVATE HEALTH INSURANCE | Admitting: Psychiatry

## 2014-12-27 ENCOUNTER — Encounter (HOSPITAL_COMMUNITY): Payer: Self-pay | Admitting: Psychiatry

## 2014-12-27 VITALS — BP 102/66 | HR 63 | Ht 64.0 in | Wt 116.2 lb

## 2014-12-27 DIAGNOSIS — F3160 Bipolar disorder, current episode mixed, unspecified: Secondary | ICD-10-CM

## 2014-12-27 DIAGNOSIS — F3130 Bipolar disorder, current episode depressed, mild or moderate severity, unspecified: Secondary | ICD-10-CM | POA: Diagnosis not present

## 2014-12-27 MED ORDER — ARIPIPRAZOLE 5 MG PO TABS: 5.0000 mg | ORAL_TABLET | Freq: Every day | ORAL | Status: DC

## 2014-12-27 MED ORDER — TRAZODONE HCL 50 MG PO TABS: ORAL_TABLET | ORAL | Status: DC

## 2014-12-27 NOTE — Progress Notes (Signed)
[facility] Progress Note  Bethany [name] [id number] 39 y.o.  12/27/2014 1:59 PM  Chief Complaint:  I stop taking Lamictal.  My rashes resolved.  I need to try something else because my anger and irritability is getting worse.      History of Present Illness:  Bethany [name] came for her follow-up appointment.  She stopped taking Lamictal after she developed a rash from Lamictal.  Her rash are now resolved.  She endorse it was helping her mood and anger but now she feels things are getting worse.  She has decided to get separated from her husband because she still does not believe on him.  She believe he is cheating and then confronted he did not like it.  She admitted poor sleep, having short temper and having anger issues.  She sleeping 4-5 hours.  She appears tired because of insomnia.  Recently she's seen her primary care physician because of kidney pain.  She has a CT scan which was normal.  She is no longer taking any muscle relaxant.  She endorsed poor impulse control, having paranoia and believed people are talking about her.  However she denies any hallucination or any suicidal thoughts.  She started seeing Dub Mikes and she is hoping counseling will help.  She denies drinking or using any illegal substances.  Her appetite is reduced.  However her vitals are stable.  She still have symptoms of grief as she has multiple losses in recent months.  She endorse nightmares and flashback.  Currently she is living with her husband but she has been thinking about getting separation.  She has 3 children.  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  Suicidal Ideation: No Plan Formed: No Patient has means to carry out plan: No  Homicidal Ideation: No Plan Formed: No Patient has means to carry out plan: No  Past Psychiatric History/Hospitalization(s): Patient members seeing a therapist when she was in [facility] after her brother killed.  She was going through grief and she was given Zoloft  and lorazepam by her primary care physician.  Patient denies any history of inpatient psychiatric treatment, suicidal attempt, psychosis or any hallucination.  She admitted mood swing, anger, irritability and manic-like symptoms in the past. Anxiety: No Bipolar Disorder: Yes Depression: Yes Mania: Yes Psychosis: No Schizophrenia: No Personality Disorder: No Hospitalization for psychiatric illness: No History of Electroconvulsive Shock Therapy: No Prior Suicide Attempts: No  Medical History; Patient has dysfunctional uterine bleeding.  History of cervical dysplasia, abnormal Pap smear and anemia.  Her primary care physician is family practice at [facility].  Patient denies any history of seizures.   Review of Systems  Constitutional: Negative for weight loss.       Tired  Cardiovascular: Negative for chest pain and palpitations.  Musculoskeletal: Positive for back pain.  Skin: Negative for itching and rash.  Neurological: Negative for dizziness, tremors and headaches.  Psychiatric/Behavioral: Positive for depression. Negative for suicidal ideas. The patient has insomnia.        Irritability    Psychiatric: Agitation: Yes Hallucination: No Depressed Mood: Yes Insomnia: Yes Hypersomnia: No Altered Concentration: No Feels Worthless: No Grandiose Ideas: No Belief In Special Powers: No New/Increased Substance Abuse: No Compulsions: No  Neurologic: Headache: No Seizure: No Paresthesias: No   Outpatient Encounter Prescriptions as of 12/27/2014  Medication Sig  . ARIPiprazole (ABILIFY) 5 MG tablet Take 1 tablet (5 mg total) by mouth daily.  . diclofenac (CATAFLAM) 50 MG tablet Take 1  tablet (50 mg total) by mouth 3 (three) times daily. One tablet TID with food prn pain.  . ferrous sulfate 325 (65 FE) MG tablet TAKE 1 TABLET (325 MG TOTAL) BY MOUTH 3 (THREE) TIMES DAILY.  . Multiple Vitamin (MULTIVITAMIN) tablet Take 1 tablet by mouth daily.    . traZODone (DESYREL) 50 MG  tablet Take 1/2 to 1 tab at bed time  . [DISCONTINUED] cyclobenzaprine (FLEXERIL) 5 MG tablet Take 1 tablet (5 mg total) by mouth 3 (three) times daily as needed for muscle spasms.   No facility-administered encounter medications on file as of 12/27/2014.    Recent Results (from the past 2160 hour(s))  Urinalysis Dipstick     Status: Abnormal   Collection Time: 12/20/14  8:34 AM  Result Value Ref Range   Color, UA YELLOW    Clarity, UA CLEAR    Glucose, UA NEG    Bilirubin, UA NEG    Ketones, UA NEG    Spec Grav, UA 1.020    Blood, UA MOD    pH, UA 6.0    Protein, UA NEG    Urobilinogen, UA 0.2    Nitrite, UA NEG    Leukocytes, UA Negative Negative  POCT UA - Microscopic Only     Status: None   Collection Time: 12/20/14  8:34 AM  Result Value Ref Range   WBC, Ur, HPF, POC RARE    RBC, urine, microscopic 0-3    Bacteria, U Microscopic FEW    Epithelial cells, urine per micros 5-10       Constitutional:  BP 102/66 mmHg  Pulse 63  Ht 5\' 4"  (1.626 m)  Wt 116 lb 3.2 oz (52.708 kg)  BMI 19.94 kg/m2  LMP 12/22/2014 (LMP Unknown)   Musculoskeletal: Strength & Muscle Tone: within normal limits Gait & Station: normal Patient leans: N/A  Psychiatric Specialty Exam: General Appearance: Casual, Guarded and Tired  Eye Contact::  Fair  Speech:  Normal Rate  Volume:  Normal  Mood:  Anxious and Irritable  Affect:  Congruent  Thought Process:  Goal Directed  Orientation:  Full (Time, Place, and Person)  Thought Content:  WDL, Paranoid Ideation and Rumination  Suicidal Thoughts:  No  Homicidal Thoughts:  No  Memory:  Immediate;   Fair Recent;   Fair Remote;   Fair  Judgement:  Intact  Insight:  Fair  Psychomotor Activity:  Normal  Concentration:  Fair  Recall:  Reading of Knowledge:  Good  Language:  Good  Akathisia:  No  Handed:  Right  AIMS (if indicated):     Assets:  Communication Skills Desire for Improvement Financial  Resources/Insurance Housing Physical Health Social Support Transportation  ADL's:  Intact  Cognition:  WNL  Sleep:        Established Problem, Stable/Improving (1), Review of Psycho-Social Stressors (1), Review or order clinical lab tests (1), Review and summation of old records (2), Established Problem, Worsening (2), Review of Last Therapy Session (1), Review of Medication Regimen & Side Effects (2) and Review of New Medication or Change in Dosage (2)  Assessment: Axis I: Bipolar disorder depressed type  Axis II: Deferred  Axis III:  Past Medical History  Diagnosis Date  . History of cervical dysplasia     laser surgery  . Anemia   . Vaginal Pap smear, abnormal   . Anxiety   . Depression      Plan:  I reviewed records from primary care physician, recent blood work  results and CT scan results physically she is doing better.  She is no longer taking Lamictal and she is happy her rash are resolved.  We will start Abilify 5 mg daily to help her mood swing irritability and anger.  Recommended to take trazodone 50 mg half to one tablet to help the insomnia .  Discussed medication side effects and benefits including EPS, metabolic syndrome and akathisia.  Encouraged to keep appointment with therapist for coping and social skills.  Discuss sleep hygiene and weight maintenance.  Discuss breathing exercise and relaxation technique.  Recommended to call us back if she has any question, concern if she feels worsening of the symptom.  Discuss safety plan that anytime having active suicidal thoughts or homicidal thoughts and she need to call 911 or go to the local emergency room.  Follow-up in 4 weeks.  Nadira Single T., MD 12/27/2014

## 2014-12-28 ENCOUNTER — Telehealth: Payer: Self-pay | Admitting: Family Medicine

## 2014-12-28 NOTE — Telephone Encounter (Signed)
Pt is calling because she would like to have her CT results read to her at the earliest possible convenience. Thank you, Fonda Kinder, ASA

## 2014-12-29 NOTE — Telephone Encounter (Signed)
CT scan normal. Recommend returning due to unchanged flank pain. Agrees to call office to schedule appointment.

## 2015-01-11 ENCOUNTER — Ambulatory Visit (INDEPENDENT_AMBULATORY_CARE_PROVIDER_SITE_OTHER): Payer: PRIVATE HEALTH INSURANCE | Admitting: Clinical

## 2015-01-11 DIAGNOSIS — F4321 Adjustment disorder with depressed mood: Secondary | ICD-10-CM | POA: Diagnosis not present

## 2015-01-11 DIAGNOSIS — F3160 Bipolar disorder, current episode mixed, unspecified: Secondary | ICD-10-CM | POA: Diagnosis not present

## 2015-01-16 ENCOUNTER — Other Ambulatory Visit: Payer: Self-pay | Admitting: *Deleted

## 2015-01-16 MED ORDER — FERROUS SULFATE 325 (65 FE) MG PO TABS: ORAL_TABLET | ORAL | Status: DC

## 2015-01-17 ENCOUNTER — Ambulatory Visit (INDEPENDENT_AMBULATORY_CARE_PROVIDER_SITE_OTHER): Payer: PRIVATE HEALTH INSURANCE | Admitting: Psychiatry

## 2015-01-17 ENCOUNTER — Encounter (HOSPITAL_COMMUNITY): Payer: Self-pay | Admitting: Clinical

## 2015-01-17 ENCOUNTER — Encounter (HOSPITAL_COMMUNITY): Payer: Self-pay | Admitting: Psychiatry

## 2015-01-17 VITALS — BP 112/72 | HR 78 | Ht 64.0 in | Wt 120.0 lb

## 2015-01-17 DIAGNOSIS — F3160 Bipolar disorder, current episode mixed, unspecified: Secondary | ICD-10-CM | POA: Diagnosis not present

## 2015-01-17 MED ORDER — ARIPIPRAZOLE 10 MG PO TABS: 10.0000 mg | ORAL_TABLET | Freq: Every day | ORAL | Status: DC

## 2015-01-17 NOTE — Progress Notes (Signed)
   THERAPIST PROGRESS NOTE  Session Time: 8:05 -9:02  Participation Level: Active  Behavioral Response: Casual and NeatAlertAnxious  Type of Therapy: Individual Therapy  Treatment Goals addressed: Improve Psychiatric Symptoms, Learn about Bipolar Diagnosis, improve unhealthy thought patterns, Emotional Regulation skills (decrease in manic and depressive moods), Discuss and Process Grief.  Interventions: Motivational Interviewing, CBT, Grounding & Mindfulness Techniques  Summary: Bethany [name] is a 38 y.o. female who presents with Bipolar I disorder, moderate, current or most recent episode manic, with mixed features and unresolved grief.  Suicidal/Homicidal: No -without intent/plan  Therapist Response: [name] met with clinician for an individual session. [name] discussed her psychiatric symptoms, her goals for theapy and her current life events. [name] shared that she felt she was starting to respond to her medication better. She shared that she has been better able to control her spending  since we last spoke. Client and clinician discussed her goals and how she could go about achieving them and how clinician would support her. [name] shared a about her desire to understand her diagnosis and to process the grief of the loss of her brother several years ago. [name] shared about her racing thoughts. Clinician introduced grounding and mindfulness techniques clinician shared a about the process practice and application of the techniques. [name] asked clarifying questions which clinician answered. Client and clinician practice some of the grounding and mindfulness techniques together. [name] agreed to practice the techniques daily until next session. Clinician briefly introduced some basic CBT concepts. Client and clinician discussed how our thoughts inform our emotions and our actions. Client and clinician agreed to discuss this topic further at next session.  Plan: Return again in 1-2  weeks.  Diagnosis: Axis I: Bipolar I disorder, moderate, current or most recent episode manic, with mixed features and unresolved grief        Powell,Frances A, LCSW 01/11/2015

## 2015-01-17 NOTE — Progress Notes (Signed)
[facility] [facility] Progress Note  Bethany [name] [id number] 39 y.o.  01/17/2015 10:25 AM  Chief Complaint:  I like Abilify but I think I need a higher dose.  I still have irritability.    History of Present Illness:  Bethany [name] came for her follow-up appointment.  She is taking Abilify 5 mg daily and trazodone half tablet at bedtime.  She has seen much improvement with Abilify.  She is less agitated and impulsive.  However she still have irritability and some mood swings.  She is living with her husband but not sure if she can continue the relationship.  She admitted that her husband is not trustworthy.  She believe husband is cheating but she wants to give him another chance.  She denies any nightmares or flashbacks.  She is seeing Bethany [name] for coping and social skills.  She like Abilify which is helping her mood and she denies any impulsive behavior.  She is no longer taking Lamictal which cause a rash.  She has no more rash.  She still have some paranoia and believes people talking about her but she denies any hallucination, suicidal parts or homicidal thought.  She denies drinking or using any illegal substances.  She denies any feeling of hopelessness or worthlessness.  Her appetite is okay.  Her vitals are stable.  Currently she is living with her husband but she is also thinking about separation.  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  Suicidal Ideation: No Plan Formed: No Patient has means to carry out plan: No  Homicidal Ideation: No Plan Formed: No Patient has means to carry out plan: No  Past Psychiatric History/Hospitalization(s): Patient members seeing a therapist when she was in [facility] after her brother killed.  She was going through grief and she was given Zoloft and lorazepam by her primary care physician.  Patient denies any history of inpatient psychiatric treatment, suicidal attempt, psychosis or any hallucination.  She admitted mood  swing, anger, irritability and manic-like symptoms in the past.  We tried Lamictal which helped but she developed a rash. Anxiety: No Bipolar Disorder: Yes Depression: Yes Mania: Yes Psychosis: No Schizophrenia: No Personality Disorder: No Hospitalization for psychiatric illness: No History of Electroconvulsive Shock Therapy: No Prior Suicide Attempts: No  Medical History; Patient has dysfunctional uterine bleeding.  History of cervical dysplasia, abnormal Pap smear and anemia.  Her primary care physician is family practice at [facility].  Patient denies any history of seizures.   Review of Systems  Constitutional: Negative for weight loss.  Cardiovascular: Negative for chest pain and palpitations.  Skin: Negative for itching and rash.  Neurological: Negative for dizziness, tremors and headaches.  Psychiatric/Behavioral: Negative for suicidal ideas.       Irritability    Psychiatric: Agitation: Irritability Hallucination: No Depressed Mood: No Insomnia: No Hypersomnia: No Altered Concentration: No Feels Worthless: No Grandiose Ideas: No Belief In Special Powers: No New/Increased Substance Abuse: No Compulsions: No  Neurologic: Headache: No Seizure: No Paresthesias: No   Outpatient Encounter Prescriptions as of 01/17/2015  Medication Sig  . ARIPiprazole (ABILIFY) 10 MG tablet Take 1 tablet (10 mg total) by mouth daily.  . diclofenac (CATAFLAM) 50 MG tablet Take 1 tablet (50 mg total) by mouth 3 (three) times daily. One tablet TID with food prn pain.  . ferrous sulfate 325 (65 FE) MG tablet TAKE 1 TABLET (325 MG TOTAL) BY MOUTH 3 (THREE) TIMES DAILY.  . Multiple Vitamin (MULTIVITAMIN)  tablet Take 1 tablet by mouth daily.    . traZODone (DESYREL) 50 MG tablet Take 1/2 to 1 tab at bed time  . [DISCONTINUED] ARIPiprazole (ABILIFY) 5 MG tablet Take 1 tablet (5 mg total) by mouth daily.   No facility-administered encounter medications on file as of 01/17/2015.     Recent Results (from the past 2160 hour(s))  Urinalysis Dipstick     Status: Abnormal   Collection Time: 12/20/14  8:34 AM  Result Value Ref Range   Color, UA YELLOW    Clarity, UA CLEAR    Glucose, UA NEG    Bilirubin, UA NEG    Ketones, UA NEG    Spec Grav, UA 1.020    Blood, UA MOD    pH, UA 6.0    Protein, UA NEG    Urobilinogen, UA 0.2    Nitrite, UA NEG    Leukocytes, UA Negative Negative  POCT UA - Microscopic Only     Status: None   Collection Time: 12/20/14  8:34 AM  Result Value Ref Range   WBC, Ur, HPF, POC RARE    RBC, urine, microscopic 0-3    Bacteria, U Microscopic FEW    Epithelial cells, urine per micros 5-10       Constitutional:  BP 112/72 mmHg  Pulse 78  Ht 5\' 4"  (1.626 m)  Wt 120 lb (54.432 kg)  BMI 20.59 kg/m2  LMP 12/22/2014 (LMP Unknown)   Musculoskeletal: Strength & Muscle Tone: within normal limits Gait & Station: normal Patient leans: N/A  Psychiatric Specialty Exam: General Appearance: Casual  Eye Contact::  Fair  Speech:  Normal Rate  Volume:  Normal  Mood:  Anxious and Irritable  Affect:  Congruent  Thought Process:  Goal Directed  Orientation:  Full (Time, Place, and Person)  Thought Content:  Paranoid Ideation and Rumination  Suicidal Thoughts:  No  Homicidal Thoughts:  No  Memory:  Immediate;   Fair Recent;   Fair Remote;   Fair  Judgement:  Intact  Insight:  Fair  Psychomotor Activity:  Normal  Concentration:  Fair  Recall:  [place] of Knowledge:  Good  Language:  Good  Akathisia:  No  Handed:  Right  AIMS (if indicated):     Assets:  Communication Skills Desire for Improvement Financial Resources/Insurance Housing Physical Health Social Support Transportation  ADL's:  Intact  Cognition:  WNL  Sleep:        Established Problem, Stable/Improving (1), Review of Psycho-Social Stressors (1), Review of Last Therapy Session (1), Review of Medication Regimen & Side Effects (2) and Review of New  Medication or Change in Dosage (2)  Assessment: Axis I: Bipolar disorder depressed type  Axis II: Deferred  Axis III:  Past Medical History  Diagnosis Date  . History of cervical dysplasia     laser surgery  . Anemia   . Vaginal Pap smear, abnormal   . Anxiety   . Depression      Plan:I Patient doing better on Abilify.  She still have irritability and paranoia but improvement from the past.  She is tolerating medication without any side effects.  She is taking half trazodone which is helping her sleep.  Recommended to increase Abilify 10 mg daily and continue to take half trazodone for insomnia if needed.  Discussed medication side effects and benefits.  Encouraged to keep appointment with [facility] for counseling.  Discuss safety plan and recommended to call us back if she has any question  or any concern.  Follow-up in 4 weeks.  ARFEEN,SYED T., MD 01/17/2015

## 2015-01-22 ENCOUNTER — Other Ambulatory Visit (HOSPITAL_COMMUNITY): Payer: Self-pay | Admitting: Psychiatry

## 2015-01-24 ENCOUNTER — Other Ambulatory Visit (HOSPITAL_COMMUNITY): Payer: Self-pay | Admitting: Psychiatry

## 2015-01-24 NOTE — Telephone Encounter (Signed)
New script given on 01/17/15 with change Abilify dose.

## 2015-01-25 ENCOUNTER — Ambulatory Visit (INDEPENDENT_AMBULATORY_CARE_PROVIDER_SITE_OTHER): Payer: PRIVATE HEALTH INSURANCE | Admitting: Clinical

## 2015-01-25 ENCOUNTER — Encounter (HOSPITAL_COMMUNITY): Payer: Self-pay | Admitting: Clinical

## 2015-01-25 DIAGNOSIS — F4321 Adjustment disorder with depressed mood: Secondary | ICD-10-CM | POA: Diagnosis not present

## 2015-01-25 DIAGNOSIS — F3160 Bipolar disorder, current episode mixed, unspecified: Secondary | ICD-10-CM | POA: Diagnosis not present

## 2015-01-25 NOTE — Progress Notes (Signed)
   THERAPIST PROGRESS NOTE  Session Time: 8:02 - 8:58  Participation Level: Active  Behavioral Response: CasualAlertAnxious   Type of Therapy: Individual Therapy  Treatment Goals addressed: Improve Psychiatric Symptoms, Learn about Bipolar Diagnosis, improve unhealthy thought patterns, Emotional Regulation skills (decrease in manic and depressive moods),   Interventions: Motivational Interviewing, CBT,  Summary: Bethany [name] is a 39 y.o. female who presents with Bipolar I disorder, moderate, current or most recent episode manic, with mixed features and unresolved grief.  Suicidal/Homicidal: No -without intent/plan  Therapist Response: [name] met with clinician for an individual session. [name] discussed her psychiatric symptoms and her current life events. [name] shared that she had been practicing her grounding and mindfulness techniques. She shared that they were helpful but that she did not always remember to use them. She shared that she had experienced a low level of mania recently. She shared that she had purchased more than she needed on impulse. She said that she was able to partially control her spending. She shared that she used a technique discussed in last session. Client and clinician discussed how she could expand her practice. [name] stated that her sleep has also improved by making some changes discussed briefly in last session. She shared that this success has prompted her to continue to make strives. She shared that today she feels she is in her quite or "normal" state. She shared that this is a good state for learning. Client and clinician continued a conversation started at last session about some basic cbt concepts. [name] and clinician discussed how our thoughts inform our emotions and perceptions. [name] agreed to complete a homework packet on bipolar and to continue her grounding and mindfulness techniques until next session.  Plan: Return again in 1-2  weeks.  Diagnosis: Axis I: Bipolar I disorder, moderate, current or most recent episode manic, with mixed features and unresolved grief    Wilkes Potvin A, LCSW 01/25/2015

## 2015-02-08 ENCOUNTER — Ambulatory Visit (INDEPENDENT_AMBULATORY_CARE_PROVIDER_SITE_OTHER): Payer: PRIVATE HEALTH INSURANCE | Admitting: Clinical

## 2015-02-08 ENCOUNTER — Encounter (HOSPITAL_COMMUNITY): Payer: Self-pay | Admitting: Clinical

## 2015-02-08 DIAGNOSIS — F3160 Bipolar disorder, current episode mixed, unspecified: Secondary | ICD-10-CM | POA: Diagnosis not present

## 2015-02-08 DIAGNOSIS — F4321 Adjustment disorder with depressed mood: Secondary | ICD-10-CM | POA: Diagnosis not present

## 2015-02-08 NOTE — Progress Notes (Signed)
   THERAPIST PROGRESS NOTE  Session Time: 8:05 -9:01  Participation Level: Active  Behavioral Response: Casual and NeatAlertNA  Type of Therapy: Individual Therapy  Treatment Goals addressed: Improve Psychiatric Symptoms, Learn about Bipolar Diagnosis, improve unhealthy thought patterns, Emotional Regulation skills (decrease in manic and depressive moods), Discuss and Process Grief.  Interventions: Motivational Interviewing, CBT,   Summary: Bethany [name] is a 39 y.o. female who presents with Bipolar I disorder, moderate, current or most recent episode manic, with mixed features and unresolved grief.  Suicidal/Homicidal: No -without intent/plan  Therapist Response: Bethany [name] met with clinician for an individual session. Bethany [name] discussed her psychiatric symptoms and her current life events. Bethany [name] shared that she would be going to [facility] next week for her birthday. She shared that the day after her birthday was the anniversary of her brother's death. She shared that she took her youngest daughter to her father's grave. He died of a very young age. Bethany [name] discussed the different kind griefs they were experiencing. Bethany [name] shared that she also believed that she needed to celebrate life and why she is going to [place]. Bethany [name] and clinician discussed her emotions and her thoughts and actions that she has been using to keep them in balance. Bethany [name] shared one of her anxieties currently is that she was offered first shift but there was complications. Client and clinician clinician used a 7 panel thought record sheet to exam and Bethany [name] is negative automatic thoughts a about the shift change. Bethany [name] identified her the evidence for and against her negative automatic thoughts. Bethany [name] was then able to formulate healthier alternative thoughts. Bethany [name] reported that her anxiety a bout the change decreased after doing the exercise. Bethany [name] shared that she continues to practice her grounding and mindfulness techniques.  Bethany [name] shared that she is sleeping much better since implementing the sleep hygiene techniques. Bethany [name] did not bring her bipolar packet this session but stated she plans to bring it next session. Bethany [name] agreed to continue practicing her grounding and mindfulness techniques    Plan: Return again in 1-2 weeks.  Diagnosis: Axis I: Bipolar I disorder, moderate, current or most recent episode manic, with mixed features and unresolved grief                             Nycere Presley A, LCSW 02/08/2015

## 2015-02-13 ENCOUNTER — Ambulatory Visit (HOSPITAL_COMMUNITY): Payer: Self-pay | Admitting: Psychiatry

## 2015-02-13 ENCOUNTER — Ambulatory Visit: Payer: PRIVATE HEALTH INSURANCE

## 2015-02-14 ENCOUNTER — Ambulatory Visit (INDEPENDENT_AMBULATORY_CARE_PROVIDER_SITE_OTHER): Payer: PRIVATE HEALTH INSURANCE | Admitting: Clinical

## 2015-02-14 ENCOUNTER — Encounter (HOSPITAL_COMMUNITY): Payer: Self-pay | Admitting: Clinical

## 2015-02-14 DIAGNOSIS — F3160 Bipolar disorder, current episode mixed, unspecified: Secondary | ICD-10-CM | POA: Diagnosis not present

## 2015-02-14 DIAGNOSIS — F4321 Adjustment disorder with depressed mood: Secondary | ICD-10-CM | POA: Diagnosis not present

## 2015-02-14 NOTE — Progress Notes (Signed)
   THERAPIST PROGRESS NOTE  Session Time: 8:15 - 9:00  Participation Level: Active  Behavioral Response: CasualAlertAnxious  Type of Therapy: Individual Therapy  Treatment Goals addressed: Improve Psychiatric Symptoms,improve unhealthy thought patterns, Emotional Regulation skills (decrease in manic and depressive moods), Discuss and Process Grief.  Interventions: Motivational Interviewing,  Grounding & Mindfulness Techniques  Summary: Bethany [name] is a 39 y.o. female who presents with Bipolar I disorder, moderate, current or most recent episode manic, with mixed features and unresolved grief.  Suicidal/Homicidal: No -without intent/plan  Therapist Response: Bethany [name] met with clinician for an individual session. Bethany [name] discussed her psychiatric symptoms and her current life events. Bethany [name] shared that she would be leaving for [employer] for her birthday celebration. She shared that she is excited and also anxious. She shared about her desire to celebrate her life. She also shared about the fact that the anniversary of her brother's death is right after her birthday and the fact that this brings sadness to her heart. She stated that she did not remember to finish and bring her bipolar packet. She stated that she plans on working on it on her flight and will bring it next session. Bethany [name] shared about her relationship with her husband. He is a Administrator. She shared about how he treats her and how it affects her mental health. She shared about the coping mechanisms she uses to deal with the unhealthy relationship. She shared about how the relationship affects her daughter's also. She shared that she is not sure what to do about the relationship right now. She identified her options. Bethany [name] and clinician discussed her grounding and mindfulness techniques. Clinician taught Bethany [name] an addition mindfulness technique which client and clinician practiced together. Bethany [name] stated that she enjoyed this one  and was going to practice it. She shared she continues to practice grounding and mindfulness techniques daily as well as continues to use her improved sleep habits to help regulate her emotions.  Plan: Return again in 1-2 weeks.  Diagnosis: Axis I: Bipolar I disorder, moderate, current or most recent episode manic, with mixed features and unresolved grief    Jakyra Kenealy A, LCSW 02/14/2015

## 2015-02-19 ENCOUNTER — Other Ambulatory Visit (HOSPITAL_COMMUNITY): Payer: Self-pay | Admitting: Psychiatry

## 2015-02-22 ENCOUNTER — Other Ambulatory Visit (HOSPITAL_COMMUNITY): Payer: Self-pay | Admitting: Psychiatry

## 2015-02-22 ENCOUNTER — Ambulatory Visit: Payer: PRIVATE HEALTH INSURANCE

## 2015-02-22 DIAGNOSIS — F3160 Bipolar disorder, current episode mixed, unspecified: Secondary | ICD-10-CM

## 2015-02-22 MED ORDER — ARIPIPRAZOLE 10 MG PO TABS: 10.0000 mg | ORAL_TABLET | Freq: Every day | ORAL | Status: DC

## 2015-02-22 MED ORDER — TRAZODONE HCL 50 MG PO TABS: ORAL_TABLET | ORAL | Status: DC

## 2015-03-03 ENCOUNTER — Ambulatory Visit (HOSPITAL_COMMUNITY): Payer: Self-pay | Admitting: Psychiatry

## 2015-03-03 ENCOUNTER — Encounter (HOSPITAL_COMMUNITY): Payer: Self-pay | Admitting: Psychiatry

## 2015-03-03 ENCOUNTER — Ambulatory Visit (INDEPENDENT_AMBULATORY_CARE_PROVIDER_SITE_OTHER): Payer: PRIVATE HEALTH INSURANCE | Admitting: Psychiatry

## 2015-03-03 VITALS — BP 102/68 | HR 77 | Resp 12 | Wt 121.6 lb

## 2015-03-03 DIAGNOSIS — F3160 Bipolar disorder, current episode mixed, unspecified: Secondary | ICD-10-CM | POA: Diagnosis not present

## 2015-03-03 MED ORDER — ARIPIPRAZOLE 10 MG PO TABS: 10.0000 mg | ORAL_TABLET | Freq: Every day | ORAL | Status: DC

## 2015-03-03 MED ORDER — TRAZODONE HCL 50 MG PO TABS: 50.0000 mg | ORAL_TABLET | Freq: Every day | ORAL | Status: DC

## 2015-03-03 NOTE — Progress Notes (Signed)
[place] Progress Note  [name] [id number] 39 y.o.  03/03/2015 9:21 AM  Chief Complaint:  Medication management and follow-up.      History of Present Illness:  Bethany [name] came for her follow-up appointment.  She is taking Abilify 10 mg with a good response.  She denies any irritability anger or any severe mood swing.  She confronted her husband about cheating and he apologize and now agreed to see a marriage counselor.  Patient is also seeing Frankie.  She endorse relationship is much improved.  Recently she visited [facility] on her birthday with her mother and she had a good time.  She is tolerating her medication.  She denies any rash, itching, tremors or shakes.  Her sleep is good.  She takes trazodone 50 mg every night which help her sleep.  She denies any mania or any psychosis.  She denies any feeling of hopelessness or worthlessness.  She denies any more paranoid feeling and she is very comfortable around people.  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  Patient lives with her husband and 3 children.  Patient denies drinking or using any illegal substances.  Her appetite is okay.  Her vitals are stable.  Suicidal Ideation: No Plan Formed: No Patient has means to carry out plan: No  Homicidal Ideation: No Plan Formed: No Patient has means to carry out plan: No  Past Psychiatric History/Hospitalization(s): Patient members seeing a therapist when she was in [facility] after her brother killed.  She was going through grief and she was given Zoloft and lorazepam by her primary care physician.  Patient denies any history of inpatient psychiatric treatment, suicidal attempt, psychosis or any hallucination.  She admitted mood swing, anger, irritability and manic-like symptoms in the past.  We tried Lamictal which helped but she developed a rash. Anxiety: No Bipolar Disorder: Yes Depression: Yes Mania: Yes Psychosis: No Schizophrenia:  No Personality Disorder: No Hospitalization for psychiatric illness: No History of Electroconvulsive Shock Therapy: No Prior Suicide Attempts: No  Medical History; Patient has dysfunctional uterine bleeding.  History of cervical dysplasia, abnormal Pap smear and anemia.  Her primary care physician is family practice at [facility].  Patient denies any history of seizures.   Review of Systems  Constitutional: Negative for weight loss.  Cardiovascular: Negative for chest pain and palpitations.  Skin: Negative for itching and rash.  Neurological: Negative for dizziness, tremors and headaches.  Psychiatric/Behavioral: Negative for suicidal ideas.    Psychiatric: Agitation: No Hallucination: No Depressed Mood: No Insomnia: No Hypersomnia: No Altered Concentration: No Feels Worthless: No Grandiose Ideas: No Belief In Special Powers: No New/Increased Substance Abuse: No Compulsions: No  Neurologic: Headache: No Seizure: No Paresthesias: No   Outpatient Encounter Prescriptions as of 03/03/2015  Medication Sig  . ARIPiprazole (ABILIFY) 10 MG tablet Take 1 tablet (10 mg total) by mouth daily.  . diclofenac (CATAFLAM) 50 MG tablet Take 1 tablet (50 mg total) by mouth 3 (three) times daily. One tablet TID with food prn pain.  . ferrous sulfate 325 (65 FE) MG tablet TAKE 1 TABLET (325 MG TOTAL) BY MOUTH 3 (THREE) TIMES DAILY.  . Multiple Vitamin (MULTIVITAMIN) tablet Take 1 tablet by mouth daily.    . traZODone (DESYREL) 50 MG tablet Take 1 tablet (50 mg total) by mouth at bedtime.  . [DISCONTINUED] ARIPiprazole (ABILIFY) 10 MG tablet Take 1 tablet (10 mg total) by mouth daily.  . [DISCONTINUED] traZODone (DESYREL) 50  MG tablet Take 1/2 to 1 tab at bed time   No facility-administered encounter medications on file as of 03/03/2015.    Recent Results (from the past 2160 hour(s))  Urinalysis Dipstick     Status: Abnormal   Collection Time: 12/20/14  8:34 AM  Result Value Ref  Range   Color, UA YELLOW    Clarity, UA CLEAR    Glucose, UA NEG    Bilirubin, UA NEG    Ketones, UA NEG    Spec Grav, UA 1.020    Blood, UA MOD    pH, UA 6.0    Protein, UA NEG    Urobilinogen, UA 0.2    Nitrite, UA NEG    Leukocytes, UA Negative Negative  POCT UA - Microscopic Only     Status: None   Collection Time: 12/20/14  8:34 AM  Result Value Ref Range   WBC, Ur, HPF, POC RARE    RBC, urine, microscopic 0-3    Bacteria, U Microscopic FEW    Epithelial cells, urine per micros 5-10       Constitutional:  BP 102/68 mmHg  Pulse 77  Resp 12  Wt 121 lb 9.6 oz (55.157 kg)   Musculoskeletal: Strength & Muscle Tone: within normal limits Gait & Station: normal Patient leans: N/A  Psychiatric Specialty Exam: General Appearance: Casual  Eye Contact::  Fair  Speech:  Normal Rate  Volume:  Normal  Mood:  Euthymic  Affect:  Congruent  Thought Process:  Goal Directed  Orientation:  Full (Time, Place, and Person)  Thought Content:  WDL  Suicidal Thoughts:  No  Homicidal Thoughts:  No  Memory:  Immediate;   Fair Recent;   Fair Remote;   Fair  Judgement:  Intact  Insight:  Fair  Psychomotor Activity:  Normal  Concentration:  Fair  Recall:  [place] of Knowledge:  Good  Language:  Good  Akathisia:  No  Handed:  Right  AIMS (if indicated):     Assets:  Communication Skills Desire for Improvement Financial Resources/Insurance Housing Physical Health Social Support Transportation  ADL's:  Intact  Cognition:  WNL  Sleep:        Established Problem, Stable/Improving (1), Review of Psycho-Social Stressors (1), Review of Last Therapy Session (1) and Review of Medication Regimen & Side Effects (2)  Assessment: Axis I: Bipolar disorder depressed type  Axis II: Deferred  Axis III:  Past Medical History  Diagnosis Date  . History of cervical dysplasia     laser surgery  . Anemia   . Vaginal Pap smear, abnormal   . Anxiety   . Depression       Plan:I Patient doing better on Abilify.  She has no side effects.  Encouraged to keep appointment with Tharon Aquas.  She started a marriage counseling with her husband is helping her marriage.  We'll continue Abilify 10 mg daily and trazodone 50 mg at bedtime.  Recommended to call us back if she has any question or any concern.  Follow-up in 3 months.  Sheneka Schrom T., MD 03/03/2015

## 2015-03-15 ENCOUNTER — Ambulatory Visit (HOSPITAL_COMMUNITY): Payer: Self-pay | Admitting: Clinical

## 2015-03-23 ENCOUNTER — Other Ambulatory Visit (HOSPITAL_COMMUNITY): Payer: Self-pay | Admitting: Psychiatry

## 2015-03-28 ENCOUNTER — Other Ambulatory Visit (HOSPITAL_COMMUNITY): Payer: Self-pay | Admitting: Psychiatry

## 2015-03-28 ENCOUNTER — Ambulatory Visit (INDEPENDENT_AMBULATORY_CARE_PROVIDER_SITE_OTHER): Payer: PRIVATE HEALTH INSURANCE | Admitting: Clinical

## 2015-03-28 ENCOUNTER — Encounter (HOSPITAL_COMMUNITY): Payer: Self-pay | Admitting: Clinical

## 2015-03-28 DIAGNOSIS — F4321 Adjustment disorder with depressed mood: Secondary | ICD-10-CM | POA: Diagnosis not present

## 2015-03-28 DIAGNOSIS — F3160 Bipolar disorder, current episode mixed, unspecified: Secondary | ICD-10-CM | POA: Diagnosis not present

## 2015-03-28 NOTE — Progress Notes (Signed)
   THERAPIST PROGRESS NOTE  Session Time: 10:05 -11:00  Participation Level: Active  Behavioral Response: Neat and Well GroomedAlertNA  Type of Therapy: Individual Therapy  Treatment Goals addressed: Improve Psychiatric Symptoms, Learn about Bipolar Diagnosis, improve unhealthy thought patterns, Emotional Regulation skills (decrease in manic and depressive moods),   Interventions: Motivational Interviewing, CBT, Grounding & Mindfulness Techniques  Summary: Bethany [name] is a 39 y.o. female who presents with Bipolar I disorder, moderate, current or most recent episode manic, with mixed features and unresolved grief.  Suicidal/Homicidal: No -without intent/plan  Therapist Response: Bethany [name] met with clinician for an individual session. Bethany [name] discussed her psychiatric symptoms and her current life events. Bethany [name] shared that she has not been to see the clinician for a while for 2 reasons 1 she had gone out and that birthday trip and then 2 on the date of her last appointment she had to take her mother to the emergency room because of an abscessed tooth. Bethany [name] shared that her birthday trip was good and that she was proud of herself for sticking to her budget. This is a big victory for her as overspending has been an issue for her in the past. She shared that when she was on the trip with her mother and friends she used her grounding and mindfulness techniques especially deep breathing to help her from losing her cool. Client and clinician discussed grounding and mindfulness techniques and her use of them as well as ways to improve. Bethany [name] shared that there were issues in her marriage and that she would like to end it. She shared that she feels she needs to be cautious in doing so be as of his temperament. Bethany [name] shared her sadness that the marriage was not working out the way she had hoped it would. Bethany [name] shared that she was using her emotional regulation techniques and they were working to not  create more drama in her life. Bethany [name] shared that she has a lot of excitement and anxiety a bout Christmas and buying presents. She shared that she would be devastated if she did not get the perfect if the people in her life. Client and clinician discussed what would happen and what it would mean about Bethany [name] if she did not purchase the perfect gift. Client and clinician discussed the evidence for and against her negative automatic thoughts. Client and clinician discussed the purpose and buying gifts and whether or not the purpose could still be there regardless of whether the gift was perfect or not. Bethany [name] was able to formulate healthier alternative thoughts a bout get buying, but she shared she was unlikely to subscribed to them. Bethany [name] did not bring back her bipolar packet to the session, she shared that she plans to bring it to next session.  Plan: Return again in 1-2 weeks.  Diagnosis: Axis I: Bipolar I disorder, moderate, current or most recent episode manic, with mixed features and unresolved grief                            Asriel Westrup A, LCSW 03/28/2015

## 2015-04-18 ENCOUNTER — Ambulatory Visit (INDEPENDENT_AMBULATORY_CARE_PROVIDER_SITE_OTHER): Payer: PRIVATE HEALTH INSURANCE | Admitting: Clinical

## 2015-04-18 DIAGNOSIS — F4321 Adjustment disorder with depressed mood: Secondary | ICD-10-CM | POA: Diagnosis not present

## 2015-04-18 DIAGNOSIS — F3160 Bipolar disorder, current episode mixed, unspecified: Secondary | ICD-10-CM

## 2015-04-21 ENCOUNTER — Encounter (HOSPITAL_COMMUNITY): Payer: Self-pay | Admitting: Clinical

## 2015-04-21 NOTE — Progress Notes (Addendum)
   THERAPIST PROGRESS NOTE  Session Time: 8:08 -9:04  Participation Level: Active  Behavioral Response: CasualAlertNA  Type of Therapy: Individual Therapy  Treatment Goals addressed: Improve Psychiatric Symptoms, Learn about Bipolar Diagnosis, improve unhealthy thought patterns, Emotional Regulation skills (decrease in manic and depressive moods),   Interventions: Motivational Interviewing, CBT, Grounding & Mindfulness Techniques  Summary: Bethany [name] is a 39 y.o. female who presents with Bipolar I disorder, moderate, current or most recent episode manic, with mixed features and unresolved grief.  Suicidal/Homicidal: No -without intent/plan  Therapist Response: Bethany [name] met with clinician for an individual session. Bethany [name] discussed her psychiatric symptoms and her current life events. Bethany [name] shared that she had a quite Christmas. She stated that she worked on releasing worry about the perfect gift and felt better about giving the gifts she gave. She shared that on Christmas her and her parents discussed her brother. Bethany [name] shared about her brother and missing him on Christmas. She shared that her and her parents discussed the curiosity about how Christmas might be different if he were still alive. She shared that they wondered things about whether the house would be louder, what he would be interested in and whether he would have more children. Bethany [name] shared that he has one child but her mother does not allow contact with his family, Bethany [name] shared that she sometimes is able to facetime with her niece (without the mothers permission). She shared that she is glad she can but wishes the contact could be more because of the connection to her brother. Client and clinician discussed her grief and acceptance. [name] shared that her marriage is going okay this week. She shared that her husband had an accident in his truck which has had the side affect of more open communication. She shared that she is  enjoying the expanded communication. Bethany [name] and clinician reviewed and discussed his packet on bipolar. She shared her thoughts and insights about the packet. She asked questions and clinician explained the information. Daily and clinician discussed some manic symptoms she experienced since last session and the skills she used to control her emotions and behaviors. Bethany [name] shared that she continues to practice her sleep hygiene and continues to reap the benefits. Client and clinician discussed how her sleep affects her emotions and psychiatric symptoms.   Plan: Return again in 1-2 weeks.  Diagnosis:Axis I: Bipolar I disorder, moderate, current or most recent episode manic, with mixed features and unresolved grief    Bethany [name] A, LCSW 04/18/15

## 2015-05-03 ENCOUNTER — Ambulatory Visit (INDEPENDENT_AMBULATORY_CARE_PROVIDER_SITE_OTHER): Payer: PRIVATE HEALTH INSURANCE | Admitting: Clinical

## 2015-05-03 ENCOUNTER — Encounter (HOSPITAL_COMMUNITY): Payer: Self-pay | Admitting: Clinical

## 2015-05-03 DIAGNOSIS — F4321 Adjustment disorder with depressed mood: Secondary | ICD-10-CM

## 2015-05-03 DIAGNOSIS — F3160 Bipolar disorder, current episode mixed, unspecified: Secondary | ICD-10-CM | POA: Diagnosis not present

## 2015-05-03 NOTE — Progress Notes (Signed)
   THERAPIST PROGRESS NOTE  Session Time: 7:05-8:00  Participation Level: Active  Behavioral Response: CasualAlertNA  Type of Therapy: Individual Therapy  Treatment Goals addressed: Improve Psychiatric Symptoms, Learn about Bipolar Diagnosis, improve unhealthy thought patterns, Emotional Regulation skills (decrease in manic and depressive moods),   Interventions: Motivational Interviewing, CBT, Grounding & Mindfulness Techniques  Summary: Bethany [name] is a 40 y.o. female who presents with Bipolar I disorder, moderate, current or most recent episode manic, with mixed features and unresolved grief.  Suicidal/Homicidal: No -without intent/plan  Therapist Response: [name] met with clinician for an individual session. [name] discussed her psychiatric symptoms and her current life events. [name] shared that she was doing well. She shared that she was experiencing some manic and some depressive symptoms but that they were much more manageable. Client and clinician discussed triggers and red flags for mania and depression. Client and clinician discussed skills to prevent relapse. [name] has been working consistently to practice her skills and techniques. She shared that she still has had urges to shop (out of control) but has used her skills to hold off until she was in a better state to make decisions. She shared that this has allowed her to start paying down her credit cards. She shared that this has given her a sense of accomplishment which has inspired her to continue. Client and clinician discussed the importance of paying attention to and celebrating her successes. [name] shared that her husband is distant again and accusatory. [name] shared her tendency to apologize even when she has done nothing wrong. Client and clinician discussed her thoughts and insights about this. She shared she has not apologized this last time. She shared that her anxiety increased but she did not feel like she had done  anything that warranted an apology. Client and clinician discussed emotional regulation skills and who is responsible for our thoughts and emotions. [name] had the insight that his emotions belong to him and she is only responsible for her own emotions and behaviors. She shared that she was proud that se had not responded even though it was uncomfortable because she recognized this as progress as well as the fact that she has not tried to regulate her emotions with shopping. Client and clinician agreed to decrease the frequency of her session.    Plan: Return again in 3-4 weeks.  Diagnosis:Axis I: Bipolar I disorder, moderate, current or most recent episode manic, with mixed features and unresolved grief     Analeia Ismael A, LCSW 05/03/2015

## 2015-05-20 ENCOUNTER — Other Ambulatory Visit: Payer: Self-pay | Admitting: Family Medicine

## 2015-05-24 ENCOUNTER — Encounter (HOSPITAL_COMMUNITY): Payer: Self-pay | Admitting: Clinical

## 2015-05-24 ENCOUNTER — Ambulatory Visit (INDEPENDENT_AMBULATORY_CARE_PROVIDER_SITE_OTHER): Payer: PRIVATE HEALTH INSURANCE | Admitting: Clinical

## 2015-05-24 DIAGNOSIS — F4321 Adjustment disorder with depressed mood: Secondary | ICD-10-CM | POA: Diagnosis not present

## 2015-05-24 DIAGNOSIS — F3160 Bipolar disorder, current episode mixed, unspecified: Secondary | ICD-10-CM

## 2015-05-24 NOTE — Progress Notes (Signed)
THERAPIST PROGRESS NOTE  Session Time: 9:00 - 9:57  Participation Level: Active  Behavioral Response: CasualAlertAnxious  Type of Therapy: Individual Therapy  Treatment Goals addressed: Improve Psychiatric Symptoms, improve unhealthy thought patterns, Emotional Regulation skills , Discuss and Process Grief.  Interventions: Motivational Interviewing, CBT,   Summary: Eviana Sibilia is a 40 y.o. female who presents with Bipolar I disorder, moderate, current or most recent episode manic, with mixed features and unresolved grief.  Suicidal/Homicidal: No -without intent/plan  Therapist Response: Lateshia met with clinician for an individual session. Cheri discussed her psychiatric symptoms and her current life events. Billijo shared that she has been managing her symptoms better. Client and clinician agreed to focus some on her unresolved grief (she cried just mentioning her brother's name) in future session as she works toward graduating from therapy. Mirza and clinician briefly discussed her brother and her grief. She shared that she has been diligently worked to maintain and improve the skills learned in therapy. Tammela shared about her biggest challenges - relationship issues. She discussed particular events, her reactions ( recognized growth) and her desire to improve. Client and clinician discussed her emotions about the events, and her negative automatic thoughts. Client and clinician discussed the evidence for and against the negative thoughts. Lavida was then able to formulate healthier alternative thoughts. Nanie shared her insight about consistency in action. Ishia agreed to continue to practice her sills learned in therapy until next session.  Plan: Return again in 1-2 weeks.  Diagnosis: Axis I: Bipolar I disorder, moderate, current or most recent episode manic, with mixed features and unresolved grief   Vance Belcourt A, LCSW 05/24/2015

## 2015-06-01 ENCOUNTER — Encounter (HOSPITAL_COMMUNITY): Payer: Self-pay | Admitting: Psychiatry

## 2015-06-01 ENCOUNTER — Ambulatory Visit (INDEPENDENT_AMBULATORY_CARE_PROVIDER_SITE_OTHER): Payer: PRIVATE HEALTH INSURANCE | Admitting: Psychiatry

## 2015-06-01 VITALS — BP 98/62 | HR 70 | Ht 64.0 in | Wt 125.4 lb

## 2015-06-01 DIAGNOSIS — F3160 Bipolar disorder, current episode mixed, unspecified: Secondary | ICD-10-CM | POA: Diagnosis not present

## 2015-06-01 DIAGNOSIS — F411 Generalized anxiety disorder: Secondary | ICD-10-CM | POA: Diagnosis not present

## 2015-06-01 MED ORDER — TRAZODONE HCL 50 MG PO TABS: 50.0000 mg | ORAL_TABLET | Freq: Every day | ORAL | Status: DC

## 2015-06-01 MED ORDER — ARIPIPRAZOLE 10 MG PO TABS: 10.0000 mg | ORAL_TABLET | Freq: Every day | ORAL | Status: DC

## 2015-06-01 MED ORDER — HYDROXYZINE PAMOATE 25 MG PO CAPS: 25.0000 mg | ORAL_CAPSULE | Freq: Every evening | ORAL | Status: DC | PRN

## 2015-06-01 NOTE — Progress Notes (Signed)
[name] [phone] Progress Note  [name] [id number] 40 y.o.  06/01/2015 11:54 AM  Chief Complaint:  I'm taking my medication but I get very anxious and nervous when I drive.       History of Present Illness:  Bethany [name] came for her follow-up appointment.  she is compliant with medication and reported no side effects.  She denies any irritability, paranoia, mood swing or anger but admitted sometime feeling very anxious and nervous especially when she drives.  She admitted some time poor sleep and racing thoughts and wondering if she can take something which can help her anxiety.  In the past she used to take Zoloft but it was discontinued when she was in the hospital.  She has no tremors or shakes.  She takes trazodone every night but some nights she has difficulty falling asleep due to anxiety.  She reported her Christmas was good.  She stayed home and enjoy time with the family.  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  Patient denies any paranoia, hallucination, suicidal thoughts or homicidal thought.  She lives with her husband and 3 children.  Patient denies drinking or using any illegal substances.  Her appetite is okay.  Her vitals are stable.  Patient seeing Tharon Aquas for counseling for coping and social skills.  Suicidal Ideation: No Plan Formed: No Patient has means to carry out plan: No  Homicidal Ideation: No Plan Formed: No Patient has means to carry out plan: No  Past Psychiatric History/Hospitalization(s): Patient members seeing a therapist when she was in [facility] [place] after her brother killed.  She was going through grief and she was given Zoloft and lorazepam by her primary care physician.  Patient denies any history of inpatient psychiatric treatment, suicidal attempt, psychosis or any hallucination.  She admitted mood swing, anger, irritability and manic-like symptoms in the past.  We tried Lamictal which helped but  she developed a rash. Anxiety: No Bipolar Disorder: Yes Depression: Yes Mania: Yes Psychosis: No Schizophrenia: No Personality Disorder: No Hospitalization for psychiatric illness: No History of Electroconvulsive Shock Therapy: No Prior Suicide Attempts: No  Medical History; Patient has dysfunctional uterine bleeding.  History of cervical dysplasia, abnormal Pap smear and anemia.  Her primary care physician is family practice at [facility].  Patient denies any history of seizures.   Review of Systems  Constitutional: Negative for weight loss.  Cardiovascular: Negative for chest pain and palpitations.  Musculoskeletal: Negative.   Skin: Negative for itching and rash.  Neurological: Negative for dizziness, tremors and headaches.  Psychiatric/Behavioral: Negative for suicidal ideas. The patient is nervous/anxious and has insomnia.     Psychiatric: Agitation: No Hallucination: No Depressed Mood: No Insomnia: No Hypersomnia: No Altered Concentration: No Feels Worthless: No Grandiose Ideas: No Belief In Special Powers: No New/Increased Substance Abuse: No Compulsions: No  Neurologic: Headache: No Seizure: No Paresthesias: No   Outpatient Encounter Prescriptions as of 06/01/2015  Medication Sig  . ARIPiprazole (ABILIFY) 10 MG tablet Take 1 tablet (10 mg total) by mouth daily.  . ferrous sulfate 325 (65 FE) MG tablet TAKE 1 TABLET (325 MG TOTAL) BY MOUTH 3 (THREE) TIMES DAILY.  . hydrOXYzine (VISTARIL) 25 MG capsule Take 1 capsule (25 mg total) by mouth at bedtime as needed for anxiety.  . Multiple Vitamin (MULTIVITAMIN) tablet Take 1 tablet by mouth daily.    . traZODone (DESYREL) 50 MG tablet Take 1 tablet (50 mg  total) by mouth at bedtime.  . [DISCONTINUED] ARIPiprazole (ABILIFY) 10 MG tablet Take 1 tablet (10 mg total) by mouth daily.  . [DISCONTINUED] diclofenac (CATAFLAM) 50 MG tablet Take 1 tablet (50 mg total) by mouth 3 (three) times daily. One tablet TID with food  prn pain. (Patient not taking: Reported on 05/03/2015)  . [DISCONTINUED] traZODone (DESYREL) 50 MG tablet Take 1 tablet (50 mg total) by mouth at bedtime.   No facility-administered encounter medications on file as of 06/01/2015.    No results found for this or any previous visit (from the past 2160 hour(s)).    Constitutional:  BP 98/62 mmHg  Pulse 70  Ht 5\' 4"  (1.626 m)  Wt 125 lb 6.4 oz (56.881 kg)  BMI 21.51 kg/m2   Musculoskeletal: Strength & Muscle Tone: within normal limits Gait & Station: normal Patient leans: N/A  Psychiatric Specialty Exam: General Appearance: Casual  Eye Contact::  Fair  Speech:  Normal Rate  Volume:  Normal  Mood:  Anxious  Affect:  Congruent  Thought Process:  Goal Directed  Orientation:  Full (Time, Place, and Person)  Thought Content:  WDL  Suicidal Thoughts:  No  Homicidal Thoughts:  No  Memory:  Immediate;   Fair Recent;   Fair Remote;   Fair  Judgement:  Intact  Insight:  Fair  Psychomotor Activity:  Normal  Concentration:  Fair  Recall:  [place] of Knowledge:  Good  Language:  Good  Akathisia:  No  Handed:  Right  AIMS (if indicated):     Assets:  Communication Skills Desire for Improvement Financial Resources/Insurance Housing Physical Health Social Support Transportation  ADL's:  Intact  Cognition:  WNL  Sleep:        Established Problem, Stable/Improving (1), Review of Psycho-Social Stressors (1), Review and summation of old records (2), Established Problem, Worsening (2), Review of Last Therapy Session (1), Review of Medication Regimen & Side Effects (2) and Review of New Medication or Change in Dosage (2)  Assessment: Axis I: Bipolar disorder depressed type  Axis II: Deferred  Axis III:  Past Medical History  Diagnosis Date  . History of cervical dysplasia     laser surgery  . Anemia   . Vaginal Pap smear, abnormal   . Anxiety   . Depression      Plan:I  Discussed to start low-dose Vistaril to  help her anxiety and nervousness.  Patient does not want any antidepressant at this time since they did not help her in the past.  We will start Vistaril 25 mg at bedtime and if it does not make her very groggy she can take during the day .  Recommended that she has to watch carefully because Vistaril can cause grogginess and sedation .  I will continue Abilify 10 mg daily and trazodone 50 mg at bedtime for insomnia.  We will consider increasing the dose of trazodone if Vistaril does not help her anxiety and insomnia.  Recommended to call us back if she has any question or any concern.  Encouraged to keep appointment with [facility] for counseling .  Follow-up in 2 months.  Time spent 25 minutes.  More than 50% of the time spent in psychoeducation, counseling and coordination of care.  Discuss safety plan that anytime having active suicidal thoughts or homicidal thoughts then patient need to call 911 or go to the local emergency room.   Skylah Delauter T., MD 06/01/2015

## 2015-06-20 ENCOUNTER — Ambulatory Visit (INDEPENDENT_AMBULATORY_CARE_PROVIDER_SITE_OTHER): Payer: PRIVATE HEALTH INSURANCE | Admitting: Clinical

## 2015-06-20 ENCOUNTER — Encounter (HOSPITAL_COMMUNITY): Payer: Self-pay | Admitting: Clinical

## 2015-06-20 DIAGNOSIS — F4321 Adjustment disorder with depressed mood: Secondary | ICD-10-CM | POA: Diagnosis not present

## 2015-06-20 DIAGNOSIS — F3160 Bipolar disorder, current episode mixed, unspecified: Secondary | ICD-10-CM | POA: Diagnosis not present

## 2015-06-20 NOTE — Progress Notes (Addendum)
   THERAPIST PROGRESS NOTE  Session Time: 9:02 - 9:58  Participation Level: Active  Behavioral Response: CasualAlertAnxious  Type of Therapy: Individual Therapy  Treatment Goals addressed: Improve Psychiatric Symptoms, improve unhealthy thought patterns, Emotional Regulation skills , Discuss and Process Grief.  Interventions: Motivational Interviewing,   Summary: Bricelyn Freestone is a 40 y.o. female who presents with Bipolar I disorder, moderate, current or most recent episode manic, with mixed features and unresolved grief.  Suicidal/Homicidal: No -without intent/plan  Therapist Response: Lillie met with clinician for an individual session. Shanieka discussed her psychiatric symptoms and her current life events. Adair shared that she is managing her symptoms better. Client and clinician discussed the skills she is using and how she works to continually make them better. Arriyana and clinician agreed to discuss her grief this session (as was purpose last session). Donella brought with her pictures of her deceased brother. Lindora shared about her thoughts and emotions about coming to this session. She shared that she considered not coming or deciding not to discuss him, but instead made the choice to face her emotions. She shared how this decision had helped her to accept that he is really gone. Kyoko prior had avoided even mentioning she had a brother to others for fear that she would have to tell him he was killed. Clinician asked open ended questions about her brother and yaeko shared details about his life and his death. Brayah shared pictures of him and shared what she missed most. Niang shared her insights from accepting his death this past week. She shared that feels less afraid to share about him because the belief that it will crush her is gone. Staley shared that this week she allowed her to make comments about things she saw that he would like. 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  Margel and clinician agreed to meet for one more session to review her progress, discuss any emotions related to grief and to discuss ways she could continue to grow. Brailyn is aware that if her symptoms increase and therapy is needed, that she can return. Constanza agreed to continue practicing her techniques until next session. Mariaelena was smiling as she left the session    Plan: Return again in 1-2 weeks.  Diagnosis: Axis I: Bipolar I disorder, moderate, current or most recent episode manic, with mixed features and unresolved grief                             Eustace Hur A, LCSW 06/20/2015

## 2015-06-29 ENCOUNTER — Other Ambulatory Visit (HOSPITAL_COMMUNITY): Payer: Self-pay | Admitting: Psychiatry

## 2015-07-04 ENCOUNTER — Telehealth (HOSPITAL_COMMUNITY): Payer: Self-pay

## 2015-07-04 ENCOUNTER — Other Ambulatory Visit (HOSPITAL_COMMUNITY): Payer: Self-pay | Admitting: Psychiatry

## 2015-07-04 NOTE — Telephone Encounter (Signed)
Ok to refill 30 pills.

## 2015-07-04 NOTE — Telephone Encounter (Signed)
Fax received for a refill on Hydroxyzine, patient was last here 2/9 and has a f/u on 5/9. Okay to refill? Please advise, thank you

## 2015-07-04 NOTE — Telephone Encounter (Signed)
Refilled patients Hydoxyzine for 1 month.

## 2015-07-05 ENCOUNTER — Ambulatory Visit (HOSPITAL_COMMUNITY): Payer: Self-pay | Admitting: Clinical

## 2015-07-31 ENCOUNTER — Other Ambulatory Visit (HOSPITAL_COMMUNITY): Payer: Self-pay | Admitting: Psychiatry

## 2015-08-03 ENCOUNTER — Other Ambulatory Visit (HOSPITAL_COMMUNITY): Payer: Self-pay | Admitting: Psychiatry

## 2015-08-03 ENCOUNTER — Telehealth (HOSPITAL_COMMUNITY): Payer: Self-pay

## 2015-08-03 DIAGNOSIS — F3160 Bipolar disorder, current episode mixed, unspecified: Secondary | ICD-10-CM

## 2015-08-03 MED ORDER — HYDROXYZINE PAMOATE 25 MG PO CAPS: ORAL_CAPSULE | ORAL | Status: DC

## 2015-08-03 NOTE — Telephone Encounter (Signed)
Prescription sent to pharmacy by Dr. Adele Schilder.

## 2015-08-03 NOTE — Telephone Encounter (Signed)
Fax received from patients pharmacy for a refill on Hydroxyzine, last visit was 2/9 and patient has a follow up on 5/9. Okay to refill? Please review and advise

## 2015-08-29 ENCOUNTER — Ambulatory Visit (INDEPENDENT_AMBULATORY_CARE_PROVIDER_SITE_OTHER): Payer: PRIVATE HEALTH INSURANCE | Admitting: Psychiatry

## 2015-08-29 ENCOUNTER — Encounter (HOSPITAL_COMMUNITY): Payer: Self-pay | Admitting: Psychiatry

## 2015-08-29 VITALS — BP 110/64 | HR 72 | Ht 64.0 in | Wt 125.2 lb

## 2015-08-29 DIAGNOSIS — F3160 Bipolar disorder, current episode mixed, unspecified: Secondary | ICD-10-CM | POA: Diagnosis not present

## 2015-08-29 MED ORDER — TRAZODONE HCL 50 MG PO TABS: 50.0000 mg | ORAL_TABLET | Freq: Every day | ORAL | Status: DC

## 2015-08-29 MED ORDER — HYDROXYZINE PAMOATE 25 MG PO CAPS: ORAL_CAPSULE | ORAL | Status: DC

## 2015-08-29 MED ORDER — ARIPIPRAZOLE 10 MG PO TABS: 10.0000 mg | ORAL_TABLET | Freq: Every day | ORAL | Status: DC

## 2015-08-29 NOTE — Progress Notes (Signed)
[place] Progress Note  Greenlee Moro [id number] 40 y.o.  08/29/2015 9:25 AM  Chief Complaint:  I am feeling better.  Vistaril is helping her anxiety and sleep.        History of Present Illness:  Ciin came for her follow-up appointment.  She is taking her medication as prescribed.  She is taking Vistaril 25 mg every night but is helping her sleep and anxiety.  He do not recall any major panic attack while driving.  She is excited as going to [place] with her girlfriend few days.  She denies any mania or any psychosis.  She described her mood is a stable .  She wants to continue Abilify , trazodone and Vistaril.  She has no tremors shakes or any EPS.  Patient denies any paranoia or any hallucination.  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  She lives with her husband and 3 children.  Patient denies drinking or using any illegal substances.  She is seeing Tharon Aquas however she missed last appointment.  Her appetite is okay.  Her vitals are stable.    Suicidal Ideation: No Plan Formed: No Patient has means to carry out plan: No  Homicidal Ideation: No Plan Formed: No Patient has means to carry out plan: No  Past Psychiatric History/Hospitalization(s): Patient started seeing a therapist when she was in [facility] after her brother killed.  She was going through grief and she was given Zoloft and lorazepam by her primary care physician.  Patient denies any history of inpatient psychiatric treatment, suicidal attempt, psychosis or any hallucination.  She admitted mood swing, anger, irritability and manic-like symptoms in the past.  We tried Lamictal which helped but she developed a rash. Anxiety: No Bipolar Disorder: Yes Depression: Yes Mania: Yes Psychosis: No Schizophrenia: No Personality Disorder: No Hospitalization for psychiatric illness: No History of Electroconvulsive Shock Therapy: No Prior Suicide Attempts:  No  Medical History; Patient has dysfunctional uterine bleeding.  History of cervical dysplasia, abnormal Pap smear and anemia.  Her primary care physician is family practice at [facility].  Patient denies any history of seizures.   Review of Systems  Constitutional: Negative for weight loss.  Cardiovascular: Negative for chest pain and palpitations.  Musculoskeletal: Negative.   Skin: Negative for itching and rash.  Neurological: Negative for dizziness, tremors and headaches.  Psychiatric/Behavioral: Negative for suicidal ideas.    Psychiatric: Agitation: No Hallucination: No Depressed Mood: No Insomnia: No Hypersomnia: No Altered Concentration: No Feels Worthless: No Grandiose Ideas: No Belief In Special Powers: No New/Increased Substance Abuse: No Compulsions: No  Neurologic: Headache: No Seizure: No Paresthesias: No   Outpatient Encounter Prescriptions as of 08/29/2015  Medication Sig  . ARIPiprazole (ABILIFY) 10 MG tablet Take 1 tablet (10 mg total) by mouth daily.  . hydrOXYzine (VISTARIL) 25 MG capsule TAKE 1 CAPSULE (25 MG TOTAL) BY MOUTH AT BEDTIME AS NEEDED FOR ANXIETY.  . Multiple Vitamin (MULTIVITAMIN) tablet Take 1 tablet by mouth daily.    . traZODone (DESYREL) 50 MG tablet Take 1 tablet (50 mg total) by mouth at bedtime.  . [DISCONTINUED] ARIPiprazole (ABILIFY) 10 MG tablet Take 1 tablet (10 mg total) by mouth daily.  . [DISCONTINUED] ferrous sulfate 325 (65 FE) MG tablet TAKE 1 TABLET (325 MG TOTAL) BY MOUTH 3 (THREE) TIMES DAILY.  . [DISCONTINUED] hydrOXYzine (VISTARIL) 25 MG capsule TAKE 1 CAPSULE (25 MG TOTAL) BY MOUTH AT BEDTIME AS  NEEDED FOR ANXIETY.  . [DISCONTINUED] traZODone (DESYREL) 50 MG tablet Take 1 tablet (50 mg total) by mouth at bedtime.   No facility-administered encounter medications on file as of 08/29/2015.    No results found for this or any previous visit (from the past 2160 hour(s)).    Constitutional:  BP 110/64 mmHg  Pulse 72   Ht 5\' 4"  (1.626 m)  Wt 125 lb 3.2 oz (56.79 kg)  BMI 21.48 kg/m2   Musculoskeletal: Strength & Muscle Tone: within normal limits Gait & Station: normal Patient leans: N/A  Psychiatric Specialty Exam: General Appearance: Casual  Eye Contact::  Fair  Speech:  Normal Rate  Volume:  Normal  Mood:  Euthymic  Affect:  Congruent  Thought Process:  Goal Directed  Orientation:  Full (Time, Place, and Person)  Thought Content:  WDL  Suicidal Thoughts:  No  Homicidal Thoughts:  No  Memory:  Immediate;   Fair Recent;   Fair Remote;   Fair  Judgement:  Intact  Insight:  Fair  Psychomotor Activity:  Normal  Concentration:  Fair  Recall:  [place] of Knowledge:  Good  Language:  Good  Akathisia:  No  Handed:  Right  AIMS (if indicated):     Assets:  Communication Skills Desire for Improvement Financial Resources/Insurance Housing Physical Health Social Support Transportation  ADL's:  Intact  Cognition:  WNL  Sleep:        Established Problem, Stable/Improving (1), Review of Last Therapy Session (1) and Review of Medication Regimen & Side Effects (2)  Assessment: Axis I: Bipolar disorder depressed type  Axis II: Deferred  Axis III:  Past Medical History  Diagnosis Date  . History of cervical dysplasia     laser surgery  . Anemia   . Vaginal Pap smear, abnormal   . Anxiety   . Depression      Plan: Patient is doing better and is stable on her current psychiatric medication.  She has no side effects.  I will continue Vistaril 25 mg at bedtime, trazodone 50 mg at bedtime and Abilify 10 mg daily.  Discussed medication side effects and benefits.  Encouraged to keep appointment with Tharon Aquas for coping skills.  Recommended to call us back if she has any question or any concern.  Follow-up in 3 months.  ARFEEN,SYED T., MD 08/29/2015

## 2015-10-03 ENCOUNTER — Other Ambulatory Visit (HOSPITAL_COMMUNITY): Payer: Self-pay | Admitting: Psychiatry

## 2015-10-27 ENCOUNTER — Ambulatory Visit (INDEPENDENT_AMBULATORY_CARE_PROVIDER_SITE_OTHER): Payer: PRIVATE HEALTH INSURANCE | Admitting: Family Medicine

## 2015-10-27 VITALS — BP 95/65 | HR 80 | Temp 98.0°F | Wt 125.4 lb

## 2015-10-27 DIAGNOSIS — Z862 Personal history of diseases of the blood and blood-forming organs and certain disorders involving the immune mechanism: Secondary | ICD-10-CM

## 2015-10-27 DIAGNOSIS — D500 Iron deficiency anemia secondary to blood loss (chronic): Secondary | ICD-10-CM | POA: Diagnosis not present

## 2015-10-27 LAB — CBC
HCT: 38 % (ref 35.0–45.0)
Hemoglobin: 12.5 g/dL (ref 11.7–15.5)
MCH: 28.7 pg (ref 27.0–33.0)
MCHC: 32.9 g/dL (ref 32.0–36.0)
MCV: 87.2 fL (ref 80.0–100.0)
MPV: 10.6 fL (ref 7.5–12.5)
PLATELETS: 247 10*3/uL (ref 140–400)
RBC: 4.36 MIL/uL (ref 3.80–5.10)
RDW: 13.6 % (ref 11.0–15.0)
WBC: 3.5 10*3/uL — ABNORMAL LOW (ref 3.8–10.8)

## 2015-10-27 NOTE — Assessment & Plan Note (Signed)
-   Will check CBC and Anemia Panel today - Follow up with OB concerning Menorrhagia - Follow up if shortness of breath, chest pain, dizziness, weakness occurs

## 2015-10-27 NOTE — Progress Notes (Signed)
Subjective:     Patient ID: Bethany [name], female   DOB: [birth year], 40 y.o.   MRN: [id number]  HPI Bethany [name] is a 40yo female presenting for Anemia. - Reports history of anemia. States she would like her labs rechecked to monitor this. - Notes she is still having heavy periods. Was started on Depo by Ob/Gyn [facility]) without any changes noted. States she was told she wasn't anemia enough for hysterectomy. - LMP yesterday - Denies shortness of breath, chest pain, dizziness, weakness  Review of Systems Per HPI. Other systems negative.    Objective:   Physical Exam  Constitutional: She appears well-developed and well-nourished. No distress.  HENT:  Pink conjunctiva.  Cardiovascular: Normal rate.   No murmur heard. Pulmonary/Chest: Effort normal. No respiratory distress. She has no wheezes.  Skin: No rash noted.  Psychiatric: She has a normal mood and affect. Her behavior is normal.      Assessment and Plan:     ANEMIA, IRON DEFICIENCY, HX OF - Will check CBC and Anemia Panel today - Follow up with OB concerning Menorrhagia - Follow up if shortness of breath, chest pain, dizziness, weakness occurs

## 2015-10-27 NOTE — Patient Instructions (Signed)
Thank you so much for coming to visit today! We will check several labs today. I will contact you with the results. Please follow up with Ob/Gyn for your heavy periods.  Thanks again! Dr. Gerlean Ren

## 2015-10-28 LAB — ANEMIA PANEL
%SAT: 17 % (ref 11–50)
ABS Retic: 30520 cells/uL (ref 20000–80000)
Ferritin: 31 ng/mL (ref 10–232)
Folate: 7.2 ng/mL (ref >5.4)
Iron: 61 ug/dL (ref 40–190)
RBC.: 4.36 MIL/uL (ref 3.80–5.10)
RETIC CT PCT: 0.7 %
TIBC: 365 ug/dL (ref 250–450)
UIBC: 304 ug/dL (ref 125–400)
Vitamin B-12: 633 pg/mL (ref 200–1100)

## 2015-10-31 ENCOUNTER — Telehealth: Payer: Self-pay | Admitting: Family Medicine

## 2015-10-31 NOTE — Telephone Encounter (Signed)
LM with female to have pt call office back to give her the below information. Katharina Caper, Kristof Nadeem D, [place]

## 2015-10-31 NOTE — Telephone Encounter (Signed)
Please let Mrs. Bethany [name] know that her hemoglobin is stable from her last CBC. Her Iron level is normal. Would still recommend following up with Ob/Gyn as discussed at her last office visit.

## 2015-11-01 NOTE — Telephone Encounter (Signed)
Patient informed, expressed understanding. 

## 2015-11-29 ENCOUNTER — Ambulatory Visit (HOSPITAL_COMMUNITY): Payer: Self-pay | Admitting: Psychiatry

## 2015-12-11 ENCOUNTER — Ambulatory Visit (INDEPENDENT_AMBULATORY_CARE_PROVIDER_SITE_OTHER): Payer: PRIVATE HEALTH INSURANCE | Admitting: Obstetrics & Gynecology

## 2015-12-11 ENCOUNTER — Encounter: Payer: Self-pay | Admitting: Obstetrics & Gynecology

## 2015-12-11 VITALS — BP 117/70 | HR 67 | Wt 127.2 lb

## 2015-12-11 DIAGNOSIS — N920 Excessive and frequent menstruation with regular cycle: Secondary | ICD-10-CM

## 2015-12-11 NOTE — Progress Notes (Signed)
Patient ID: Bethany [name], female   DOB: May 08, 1975, 40 y.o.   MRN: [id number]  Heavy periods  HPI Bethany [name] is a 40 y.o. female.  [id number] Patient's last menstrual period was 11/22/2015 (exact date). Menses last for 12 days, uses adult diaper She wants to schedule endometrial ablation HPI  Past Medical History:  Diagnosis Date  . Anemia   . Anxiety   . Depression   . History of cervical dysplasia    laser surgery  . Vaginal Pap smear, abnormal     Past Surgical History:  Procedure Laterality Date  . BREAST FIBROADENOMA SURGERY  2011, 2008   benign   . CESAREAN SECTION  1995  . TUBAL LIGATION  2003    Family History  Problem Relation Age of Onset  . Anxiety disorder Father   . Depression Father   . Diabetes Maternal Grandmother   . Hypertension Paternal Grandfather   . Cancer Maternal Aunt     breast  . Alcohol abuse Maternal Uncle   . Alcohol abuse Cousin   . Drug abuse Cousin   . Stroke Neg Hx     Social History Social History  Substance Use Topics  . Smoking status: Never Smoker  . Smokeless tobacco: Never Used  . Alcohol use No    Allergies  Allergen Reactions  . Lamictal [Lamotrigine] Rash  . Codeine Hives  . Oxycodone Hcl Hives    Current Outpatient Prescriptions  Medication Sig Dispense Refill  . ARIPiprazole (ABILIFY) 10 MG tablet Take 1 tablet (10 mg total) by mouth daily. 30 tablet 2  . hydrOXYzine (VISTARIL) 25 MG capsule TAKE 1 CAPSULE (25 MG TOTAL) BY MOUTH AT BEDTIME AS NEEDED FOR ANXIETY. 30 capsule 2  . traZODone (DESYREL) 50 MG tablet Take 1 tablet (50 mg total) by mouth at bedtime. 30 tablet 2  . Multiple Vitamin (MULTIVITAMIN) tablet Take 1 tablet by mouth daily.       No current facility-administered medications for this visit.     Review of Systems Review of Systems  Genitourinary: Positive for menstrual problem. Negative for dyspareunia, vaginal bleeding and vaginal discharge.    Blood pressure 117/70, pulse 67, weight  127 lb 3.2 oz (57.7 kg), last menstrual period 11/22/2015.  Physical Exam Physical Exam  Constitutional: She appears well-developed. No distress.  Pulmonary/Chest: Effort normal.  Skin: No pallor.  Psychiatric: She has a normal mood and affect. Her behavior is normal.    Data Reviewed [name] 2016 normal CBC    Component Value Date/Time   WBC 3.5 (L) 10/27/2015 1029   RBC 4.36 10/27/2015 1029   RBC 4.36 10/27/2015 1029   HGB 12.5 10/27/2015 1029   HGB 13.4 05/26/2012 0928   HCT 38.0 10/27/2015 1029   HCT 39.5 05/26/2012 0928   PLT 247 10/27/2015 1029   PLT 177 05/26/2012 0928   MCV 87.2 10/27/2015 1029   MCV 88.7 05/26/2012 0928   MCH 28.7 10/27/2015 1029   MCHC 32.9 10/27/2015 1029   RDW 13.6 10/27/2015 1029   RDW 13.3 05/26/2012 0928   LYMPHSABS 1.6 05/26/2012 0928   MONOABS 0.3 05/26/2012 0928   EOSABS 0.0 05/26/2012 0928   BASOSABS 0.0 05/26/2012 [id number]     Assessment    Menorrhagia regular cycle    Plan    Counseled about risks and benefits of endometrial ablation and she wishes to have this done. Will RTC for biopsy preop and request sent to [facility]       ARNOLD,JAMES 12/11/2015, 11:25  AM

## 2015-12-11 NOTE — Patient Instructions (Signed)
Endometrial Ablation °Endometrial ablation removes the lining of the uterus (endometrium). It is usually a same-day, outpatient treatment. Ablation helps avoid major surgery, such as surgery to remove the cervix and uterus (hysterectomy). After endometrial ablation, you will have little or no menstrual bleeding and may not be able to have children. However, if you are premenopausal, you will need to use a reliable method of birth control following the procedure because of the small chance that pregnancy can occur. °There are different reasons to have this procedure. These reasons include: °· Heavy periods. °· Bleeding that is causing anemia. °· Irregular bleeding. °· Bleeding fibroids on the lining inside the uterus if they are smaller than 3 centimeters. °This procedure may not be possible for you if:  °· You want to have children in the future.   °· You have severe cramps with your menstrual period.   °· You have precancerous or cancerous cells in your uterus.   °· You were recently pregnant.   °· You have gone through menopause.   °· You have had major surgery on your uterus, resulting in thinning of the uterine wall. Surgeries may include: °¨ The removal of one or more uterine fibroids (myomectomy). °¨ A cesarean section with a classic (vertical) incision on your uterus. Ask your health care provider what type of cesarean you had. Sometimes the scar on your skin is different than the scar on your uterus. °Even if you have had surgery on your uterus, certain types of ablation may still be safe for you. Talk with your health care provider. °LET YOUR HEALTH CARE PROVIDER KNOW ABOUT: °· Any allergies you have. °· All medicines you are taking, including vitamins, herbs, eye drops, creams, and over-the-counter medicines. °· Previous problems you or members of your family have had with the use of anesthetics. °· Any blood disorders you have. °· Previous surgeries you have had. °· Medical conditions you have. °RISKS AND  COMPLICATIONS  °Generally, this is a safe procedure. However, as with any procedure, complications can occur. Possible complications include: °· Perforation of the uterus. °· Bleeding. °· Infection of the uterus, bladder, or vagina. °· Injury to surrounding organs. °· An air bubble to the lung (air embolus). °· Pregnancy following the procedure. °· Failure of the procedure to help the problem, requiring hysterectomy. °· Decreased ability to diagnose cancer in the lining of the uterus. °BEFORE THE PROCEDURE °· The lining of the uterus must be tested to make sure there is no pre-cancerous or cancer cells present. °· An ultrasound may be performed to look at the size of the uterus and to check for abnormalities. °· Medicines may be given to thin the lining of the uterus. °PROCEDURE  °During the procedure, your health care provider will use a tool called a resectoscope to help see inside your uterus. There are different ways to remove the lining of your uterus.  °· Radiofrequency - This method uses a radiofrequency-alternating electric current to remove the lining of the uterus. °· Cryotherapy - This method uses extreme cold to freeze the lining of the uterus. °· Heated-Free Liquid - This method uses heated salt (saline) solution to remove the lining of the uterus. °· Microwave - This method uses high-energy microwaves to heat up the lining of the uterus to remove it. °· Thermal balloon - This method involves inserting a catheter with a balloon tip into the uterus. The balloon tip is filled with heated fluid to remove the lining of the uterus. °AFTER THE PROCEDURE  °After your procedure, do   not have sexual intercourse or insert anything into your vagina until permitted by your health care provider. After the procedure, you may experience: °· Cramps. °· Vaginal discharge. °· Frequent urination. °  °This information is not intended to replace advice given to you by your health care provider. Make sure you discuss any  questions you have with your health care provider. °  °Document Released: 02/16/2004 Document Revised: 12/28/2014 Document Reviewed: 09/09/2012 °Elsevier Interactive Patient Education ©2016 Else[employer] Inc[employer] ° °

## 2015-12-13 ENCOUNTER — Encounter (HOSPITAL_COMMUNITY): Payer: Self-pay | Admitting: *Deleted

## 2015-12-14 ENCOUNTER — Other Ambulatory Visit (HOSPITAL_COMMUNITY): Payer: Self-pay | Admitting: Psychiatry

## 2015-12-14 DIAGNOSIS — F3160 Bipolar disorder, current episode mixed, unspecified: Secondary | ICD-10-CM

## 2015-12-18 ENCOUNTER — Telehealth: Payer: Self-pay | Admitting: *Deleted

## 2015-12-18 ENCOUNTER — Other Ambulatory Visit (HOSPITAL_COMMUNITY): Payer: Self-pay

## 2015-12-18 DIAGNOSIS — F3160 Bipolar disorder, current episode mixed, unspecified: Secondary | ICD-10-CM

## 2015-12-18 MED ORDER — ARIPIPRAZOLE 10 MG PO TABS: 10.0000 mg | ORAL_TABLET | Freq: Every day | ORAL | 0 refills | Status: DC

## 2015-12-18 MED ORDER — TRAZODONE HCL 50 MG PO TABS: 50.0000 mg | ORAL_TABLET | Freq: Every day | ORAL | 0 refills | Status: DC

## 2015-12-18 MED ORDER — HYDROXYZINE PAMOATE 25 MG PO CAPS: ORAL_CAPSULE | ORAL | 0 refills | Status: DC

## 2015-12-18 NOTE — Telephone Encounter (Addendum)
t left message stating that she has appt on 9/14 in office for endometrial biopsy however her surgery is scheduled for 9/12. She was told she needs Bx first and wants to know if that is correct. I returned call to pt and left message on her personal vocie mail that she does need the biopsy first. I am going to try to get her scheduled with Dr. Roselie Awkward on 8/30 or 8/31 for the biopsy so that her surgery does not have to be postponed. She will be contacted tomorrow with that biopsy appt information. Message sent to scheduling staff for appt need.   8/30  Called pt and informed her of appt tomorrow in our office @ 1040 for endometrial biopsy.  Pt agreed and voiced understanding.

## 2015-12-19 ENCOUNTER — Other Ambulatory Visit: Payer: Self-pay | Admitting: Obstetrics & Gynecology

## 2015-12-21 ENCOUNTER — Other Ambulatory Visit (HOSPITAL_COMMUNITY)
Admission: RE | Admit: 2015-12-21 | Discharge: 2015-12-21 | Disposition: A | Discharge status: Home / Self Care | Payer: PRIVATE HEALTH INSURANCE | Source: Ambulatory Visit | Attending: Obstetrics & Gynecology | Admitting: Obstetrics & Gynecology

## 2015-12-21 ENCOUNTER — Encounter: Payer: Self-pay | Admitting: General Practice

## 2015-12-21 ENCOUNTER — Encounter: Payer: Self-pay | Admitting: Obstetrics & Gynecology

## 2015-12-21 ENCOUNTER — Ambulatory Visit (INDEPENDENT_AMBULATORY_CARE_PROVIDER_SITE_OTHER): Payer: PRIVATE HEALTH INSURANCE | Admitting: Obstetrics & Gynecology

## 2015-12-21 VITALS — BP 105/65 | HR 68 | Ht 64.0 in | Wt 123.4 lb

## 2015-12-21 DIAGNOSIS — Z3202 Encounter for pregnancy test, result negative: Secondary | ICD-10-CM

## 2015-12-21 DIAGNOSIS — N920 Excessive and frequent menstruation with regular cycle: Secondary | ICD-10-CM | POA: Insufficient documentation

## 2015-12-21 LAB — POCT PREGNANCY, URINE: Preg Test, Ur: NEGATIVE

## 2015-12-21 NOTE — Progress Notes (Signed)
CC: heavy periods, scheduled for endometrial ablation Biopsy today  Patient given informed consent, signed copy in the chart, time out was performed. Appropriate time out taken. . The patient was placed in the lithotomy position and the cervix brought into view with sterile speculum.  Portio of cervix cleansed x 2 with betadine swabs.  A tenaculum was placed in the anterior lip of the cervix.  The uterus was sounded for depth of 8 cm. A pipelle was introduced to into the uterus, suction created,  and an endometrial sample was obtained. All equipment was removed and accounted for.  The patient tolerated the procedure well.    Patient given post procedure instructions. The patient will return in 2 weeks for endometrial ablation  Woodroe Mode, MD 12/21/2015 .

## 2015-12-22 ENCOUNTER — Encounter (HOSPITAL_COMMUNITY): Payer: Self-pay | Admitting: *Deleted

## 2016-01-01 ENCOUNTER — Telehealth: Payer: Self-pay | Admitting: General Practice

## 2016-01-01 NOTE — Telephone Encounter (Signed)
Called patient & informed her of negative endometrial biopsy. Patient verbalized understanding & had no questions

## 2016-01-02 ENCOUNTER — Ambulatory Visit (HOSPITAL_COMMUNITY): Payer: PRIVATE HEALTH INSURANCE | Admitting: Anesthesiology

## 2016-01-02 ENCOUNTER — Encounter (HOSPITAL_COMMUNITY)
Admission: RE | Disposition: A | Discharge status: Home / Self Care | Payer: Self-pay | Source: Ambulatory Visit | Attending: Obstetrics & Gynecology

## 2016-01-02 ENCOUNTER — Ambulatory Visit (HOSPITAL_COMMUNITY)
Admission: RE | Admit: 2016-01-02 | Discharge: 2016-01-02 | Disposition: A | Discharge status: Home / Self Care | Payer: PRIVATE HEALTH INSURANCE | Source: Ambulatory Visit | Attending: Obstetrics & Gynecology | Admitting: Obstetrics & Gynecology

## 2016-01-02 ENCOUNTER — Encounter (HOSPITAL_COMMUNITY): Payer: Self-pay

## 2016-01-02 DIAGNOSIS — N920 Excessive and frequent menstruation with regular cycle: Secondary | ICD-10-CM | POA: Diagnosis not present

## 2016-01-02 HISTORY — DX: Insomnia, unspecified: G47.00

## 2016-01-02 HISTORY — PX: HYSTEROSCOPY WITH NOVASURE: SHX5574

## 2016-01-02 LAB — CBC
HCT: 35.1 % — ABNORMAL LOW (ref 36.0–46.0)
HEMOGLOBIN: 11.9 g/dL — AB (ref 12.0–15.0)
MCH: 28.7 pg (ref 26.0–34.0)
MCHC: 33.9 g/dL (ref 30.0–36.0)
MCV: 84.8 fL (ref 78.0–100.0)
Platelets: 223 10*3/uL (ref 150–400)
RBC: 4.14 MIL/uL (ref 3.87–5.11)
RDW: 14.1 % (ref 11.5–15.5)
WBC: 5.2 10*3/uL (ref 4.0–10.5)

## 2016-01-02 LAB — PREGNANCY, URINE: Preg Test, Ur: NEGATIVE

## 2016-01-02 SURGERY — HYSTEROSCOPY WITH NOVASURE
Anesthesia: General | Site: Vagina

## 2016-01-02 MED ORDER — ONDANSETRON HCL 4 MG/2ML IJ SOLN
INTRAMUSCULAR | Status: DC | PRN
  Administered 2016-01-02: 4 mg via INTRAVENOUS

## 2016-01-02 MED ORDER — GLYCOPYRROLATE 0.2 MG/ML IJ SOLN
INTRAMUSCULAR | Status: AC
  Filled 2016-01-02: qty 1

## 2016-01-02 MED ORDER — EPHEDRINE 5 MG/ML INJ
INTRAVENOUS | Status: AC
  Filled 2016-01-02: qty 10

## 2016-01-02 MED ORDER — MIDAZOLAM HCL 2 MG/2ML IJ SOLN
INTRAMUSCULAR | Status: AC
  Filled 2016-01-02: qty 2

## 2016-01-02 MED ORDER — FENTANYL CITRATE (PF) 100 MCG/2ML IJ SOLN
INTRAMUSCULAR | Status: DC | PRN
  Administered 2016-01-02: 25 ug via INTRAVENOUS
  Administered 2016-01-02: 50 ug via INTRAVENOUS
  Administered 2016-01-02: 25 ug via INTRAVENOUS

## 2016-01-02 MED ORDER — MIDAZOLAM HCL 2 MG/2ML IJ SOLN
INTRAMUSCULAR | Status: DC | PRN
  Administered 2016-01-02: 1 mg via INTRAVENOUS

## 2016-01-02 MED ORDER — BUPIVACAINE-EPINEPHRINE (PF) 0.5% -1:200000 IJ SOLN
INTRAMUSCULAR | Status: AC
  Filled 2016-01-02: qty 30

## 2016-01-02 MED ORDER — LACTATED RINGERS IV SOLN: INTRAVENOUS | Status: DC

## 2016-01-02 MED ORDER — SCOPOLAMINE 1 MG/3DAYS TD PT72
1.0000 | MEDICATED_PATCH | Freq: Once | TRANSDERMAL | Status: DC
  Administered 2016-01-02: 1.5 mg via TRANSDERMAL

## 2016-01-02 MED ORDER — KETOROLAC TROMETHAMINE 30 MG/ML IJ SOLN
INTRAMUSCULAR | Status: AC
  Filled 2016-01-02: qty 1

## 2016-01-02 MED ORDER — OXYCODONE HCL 5 MG PO TABS: 5.0000 mg | ORAL_TABLET | Freq: Once | ORAL | Status: DC | PRN

## 2016-01-02 MED ORDER — OXYCODONE HCL 5 MG/5ML PO SOLN: 5.0000 mg | Freq: Once | ORAL | Status: DC | PRN

## 2016-01-02 MED ORDER — FENTANYL CITRATE (PF) 100 MCG/2ML IJ SOLN
INTRAMUSCULAR | Status: AC
Start: 2016-01-02 — End: 2016-01-02
  Filled 2016-01-02: qty 2

## 2016-01-02 MED ORDER — ONDANSETRON HCL 4 MG/2ML IJ SOLN
INTRAMUSCULAR | Status: AC
  Filled 2016-01-02: qty 2

## 2016-01-02 MED ORDER — BUPIVACAINE-EPINEPHRINE 0.5% -1:200000 IJ SOLN
INTRAMUSCULAR | Status: DC | PRN
  Administered 2016-01-02: 10 mL

## 2016-01-02 MED ORDER — KETOROLAC TROMETHAMINE 30 MG/ML IJ SOLN
INTRAMUSCULAR | Status: DC | PRN
  Administered 2016-01-02: 30 mg via INTRAVENOUS

## 2016-01-02 MED ORDER — FENTANYL CITRATE (PF) 100 MCG/2ML IJ SOLN: 25.0000 ug | INTRAMUSCULAR | Status: DC | PRN

## 2016-01-02 MED ORDER — EPHEDRINE SULFATE 50 MG/ML IJ SOLN
INTRAMUSCULAR | Status: DC | PRN
  Administered 2016-01-02: 5 mg via INTRAVENOUS

## 2016-01-02 MED ORDER — PROPOFOL 10 MG/ML IV BOLUS
INTRAVENOUS | Status: DC | PRN
  Administered 2016-01-02: 120 mg via INTRAVENOUS

## 2016-01-02 MED ORDER — LIDOCAINE HCL (CARDIAC) 20 MG/ML IV SOLN
INTRAVENOUS | Status: DC | PRN
  Administered 2016-01-02: 80 mg via INTRAVENOUS

## 2016-01-02 MED ORDER — SCOPOLAMINE 1 MG/3DAYS TD PT72
MEDICATED_PATCH | TRANSDERMAL | Status: AC
  Administered 2016-01-02: 1.5 mg via TRANSDERMAL
  Filled 2016-01-02: qty 1

## 2016-01-02 MED ORDER — LACTATED RINGERS IV SOLN
INTRAVENOUS | Status: DC
  Administered 2016-01-02: 1000 mL via INTRAVENOUS
  Administered 2016-01-02: 10:00:00 via INTRAVENOUS

## 2016-01-02 MED ORDER — PROMETHAZINE HCL 25 MG/ML IJ SOLN: 6.2500 mg | INTRAMUSCULAR | Status: DC | PRN

## 2016-01-02 MED ORDER — IBUPROFEN 800 MG PO TABS
800.0000 mg | ORAL_TABLET | Freq: Three times a day (TID) | ORAL | 1 refills | Status: DC | PRN
Start: 2016-01-02 — End: 2018-10-08

## 2016-01-02 SURGICAL SUPPLY — 15 items
ABLATOR ENDOMETRIAL BIPOLAR (ABLATOR) ×3 IMPLANT
CANISTER SUCT 3000ML (MISCELLANEOUS) ×3 IMPLANT
CATH ROBINSON RED A/P 16FR (CATHETERS) ×3 IMPLANT
CLOTH BEACON ORANGE TIMEOUT ST (SAFETY) ×3 IMPLANT
CONTAINER PREFILL 10% NBF 60ML (FORM) ×6 IMPLANT
GLOVE BIO SURGEON STRL SZ 6.5 (GLOVE) ×2 IMPLANT
GLOVE BIO SURGEONS STRL SZ 6.5 (GLOVE) ×1
GLOVE BIOGEL PI IND STRL 7.0 (GLOVE) ×2 IMPLANT
GLOVE BIOGEL PI INDICATOR 7.0 (GLOVE) ×4
GOWN STRL REUS W/TWL LRG LVL3 (GOWN DISPOSABLE) ×9 IMPLANT
PACK VAGINAL MINOR WOMEN LF (CUSTOM PROCEDURE TRAY) ×3 IMPLANT
PAD OB MATERNITY 4.3X12.25 (PERSONAL CARE ITEMS) ×3 IMPLANT
TOWEL OR 17X24 6PK STRL BLUE (TOWEL DISPOSABLE) ×6 IMPLANT
TUBING AQUILEX INFLOW (TUBING) ×3 IMPLANT
WATER STERILE IRR 1000ML POUR (IV SOLUTION) ×3 IMPLANT

## 2016-01-02 NOTE — Anesthesia Preprocedure Evaluation (Signed)
Anesthesia Evaluation  Patient identified by MRN, date of birth, ID band Patient awake    Reviewed: Allergy & Precautions, NPO status , Patient's Chart, lab work & pertinent test results  Airway Mallampati: II  TM Distance: >3 FB Neck ROM: Full    Dental no notable dental hx.    Pulmonary neg pulmonary ROS,    Pulmonary exam normal breath sounds clear to auscultation       Cardiovascular negative cardio ROS Normal cardiovascular exam Rhythm:Regular Rate:Normal     Neuro/Psych Anxiety negative neurological ROS     GI/Hepatic negative GI ROS, Neg liver ROS,   Endo/Other  negative endocrine ROS  Renal/GU negative Renal ROS  negative genitourinary   Musculoskeletal negative musculoskeletal ROS (+)   Abdominal   Peds negative pediatric ROS (+)  Hematology negative hematology ROS (+)   Anesthesia Other Findings   Reproductive/Obstetrics negative OB ROS                             Anesthesia Physical Anesthesia Plan  ASA: II  Anesthesia Plan: General   Post-op Pain Management:    Induction: Intravenous  Airway Management Planned: LMA  Additional Equipment:   Intra-op Plan:   Post-operative Plan: Extubation in OR  Informed Consent: I have reviewed the patients History and Physical, chart, labs and discussed the procedure including the risks, benefits and alternatives for the proposed anesthesia with the patient or authorized representative who has indicated his/her understanding and acceptance.   Dental advisory given  Plan Discussed with: CRNA and Surgeon  Anesthesia Plan Comments:         Anesthesia Quick Evaluation  

## 2016-01-02 NOTE — Anesthesia Postprocedure Evaluation (Signed)
Anesthesia Post Note  Patient: Bethany [name]  Procedure(s) Performed: Procedure(s) (LRB): NOVASURE (N/A)  Patient location during evaluation: PACU Anesthesia Type: General Level of consciousness: awake and alert Pain management: pain level controlled Vital Signs Assessment: post-procedure vital signs reviewed and stable Respiratory status: spontaneous breathing, nonlabored ventilation, respiratory function stable and patient connected to nasal cannula oxygen Cardiovascular status: blood pressure returned to baseline and stable Postop Assessment: no signs of nausea or vomiting Anesthetic complications: no    Last Vitals:  Vitals:   01/02/16 0918 01/02/16 1028  BP: 103/77 (P) 107/70  Pulse: 74   Resp: 16 (P) 16  Temp: 36.7 C (P) 37 C    Last Pain:  Vitals:   01/02/16 1028  TempSrc:   PainSc: (P) 0-No pain                 Xeng Kucher S

## 2016-01-02 NOTE — H&P (Signed)
Heavy periods  HPI Bethany [name] is a 40 y.o. female.  [id number] Patient's last menstrual period was 11/22/2015 (exact date). Menses last for 12 days, uses adult diaper She wants to schedule endometrial ablation HPI      Past Medical History:  Diagnosis Date  . Anemia   . Anxiety   . Depression   . History of cervical dysplasia    laser surgery  . Vaginal Pap smear, abnormal          Past Surgical History:  Procedure Laterality Date  . BREAST FIBROADENOMA SURGERY  2011, 2008   benign   . CESAREAN SECTION  1995  . TUBAL LIGATION  2003          Family History  Problem Relation Age of Onset  . Anxiety disorder Father   . Depression Father   . Diabetes Maternal Grandmother   . Hypertension Paternal Grandfather   . Cancer Maternal Aunt     breast  . Alcohol abuse Maternal Uncle   . Alcohol abuse Cousin   . Drug abuse Cousin   . Stroke Neg Hx     Social History     Social History  Substance Use Topics  . Smoking status: Never Smoker  . Smokeless tobacco: Never Used  . Alcohol use No        Allergies  Allergen Reactions  . Lamictal [Lamotrigine] Rash  . Codeine Hives  . Oxycodone Hcl Hives          Current Outpatient Prescriptions  Medication Sig Dispense Refill  . ARIPiprazole (ABILIFY) 10 MG tablet Take 1 tablet (10 mg total) by mouth daily. 30 tablet 2  . hydrOXYzine (VISTARIL) 25 MG capsule TAKE 1 CAPSULE (25 MG TOTAL) BY MOUTH AT BEDTIME AS NEEDED FOR ANXIETY. 30 capsule 2  . traZODone (DESYREL) 50 MG tablet Take 1 tablet (50 mg total) by mouth at bedtime. 30 tablet 2  . Multiple Vitamin (MULTIVITAMIN) tablet Take 1 tablet by mouth daily.       No current facility-administered medications for this visit.     Review of Systems Review of Systems  Genitourinary: Positive for menstrual problem. Negative for dyspareunia, vaginal bleeding and vaginal discharge.    Height 5\' 4"  (1.626 m), weight 55.8 kg (123 lb),  last menstrual period 12/18/2015.   Physical Exam Physical Exam  Constitutional: She appears well-developed. No distress.  Pulmonary/Chest: Effort normal.  Skin: No pallor.  Psychiatric: She has a normal mood and affect. Her behavior is normal.    Data Reviewed [name] 2016 normal CBC Labs(Brief)          Component Value Date/Time   WBC 3.5 (L) 10/27/2015 1029   RBC 4.36 10/27/2015 1029   RBC 4.36 10/27/2015 1029   HGB 12.5 10/27/2015 1029   HGB 13.4 05/26/2012 0928   HCT 38.0 10/27/2015 1029   HCT 39.5 05/26/2012 0928   PLT 247 10/27/2015 1029   PLT 177 05/26/2012 0928   MCV 87.2 10/27/2015 1029   MCV 88.7 05/26/2012 0928   MCH 28.7 10/27/2015 1029   MCHC 32.9 10/27/2015 1029   RDW 13.6 10/27/2015 1029   RDW 13.3 05/26/2012 0928   LYMPHSABS 1.6 05/26/2012 0928   MONOABS 0.3 05/26/2012 0928   EOSABS 0.0 05/26/2012 0928   BASOSABS 0.0 05/26/2012 0928      CBC    Component Value Date/Time   WBC 3.5 (L) 10/27/2015 1029   RBC 4.36 10/27/2015 1029   RBC 4.36 10/27/2015 1029   HGB  12.5 10/27/2015 1029   HGB 13.4 05/26/2012 0928   HCT 38.0 10/27/2015 1029   HCT 39.5 05/26/2012 0928   PLT 247 10/27/2015 1029   PLT 177 05/26/2012 0928   MCV 87.2 10/27/2015 1029   MCV 88.7 05/26/2012 0928   MCH 28.7 10/27/2015 1029   MCHC 32.9 10/27/2015 1029   RDW 13.6 10/27/2015 1029   RDW 13.3 05/26/2012 0928   LYMPHSABS 1.6 05/26/2012 0928   MONOABS 0.3 05/26/2012 0928   EOSABS 0.0 05/26/2012 0928   BASOSABS 0.0 05/26/2012 [id number]     Assessment    Menorrhagia regular cycle  Benign endometrial biopsy   Plan    Counseled about risks and benefits of endometrial ablation and she wishes to have this done. Patient desires surgical management with NovaSure ablation.  The risks of surgery were discussed in detail with the patient including but not limited to: bleeding which may require transfusion or reoperation; infection which may require prolonged  hospitalization or re-hospitalization and antibiotic therapy; injury to bowel, bladder, ureters and major vessels or other surrounding organs; need for additional procedures including laparotomy; thromboembolic phenomenon, incisional problems and other postoperative or anesthesia complications.  Patient was told that the likelihood that her condition and symptoms will be treated effectively with this surgical management was very high; the postoperative expectations were also discussed in detail. The patient also understands the alternative treatment options which were discussed in full. All questions were answered.    Woodroe Mode, MD 01/02/2016

## 2016-01-02 NOTE — Transfer of Care (Signed)
Immediate Anesthesia Transfer of Care Note  Patient: Bethany [name]  Procedure(s) Performed: Procedure(s): NOVASURE (N/A)  Patient Location: PACU  Anesthesia Type:General  Level of Consciousness: awake, alert , oriented and patient cooperative  Airway & Oxygen Therapy: Patient Spontanous Breathing and Patient connected to nasal cannula oxygen  Post-op Assessment: Report given to RN and Post -op Vital signs reviewed and stable  Post vital signs: Reviewed and stable  Last Vitals:  Vitals:   01/02/16 0918  BP: 103/77  Pulse: 74  Resp: 16  Temp: 36.7 C    Last Pain:  Vitals:   01/02/16 0918  TempSrc: Oral      Patients Stated Pain Goal: 3 ([id number])  Complications: No apparent anesthesia complications

## 2016-01-02 NOTE — Op Note (Signed)
PREOPERATIVE DIAGNOSIS:  Menorhagia with regular cycle POSTOPERATIVE DIAGNOSIS: The same PROCEDURE:   NovaSure Endometrial Ablation SURGEON:  Woodroe Mode, MD   INDICATIONS: 40 y.o. [phone]  here for NovaSure Endometrial Ablation.  Risks of surgery were discussed with the patient including but not limited to: bleeding which may require transfusion; infection which may require antibiotics; injury to uterus leading to risk of injury to surrounding intraperitoneal organs, need for additional procedures including laparoscopy or laparotomy, and other postoperative/anesthesia complications. Written informed consent was obtained.   FINDINGS:  A 4-6 week size uterus.   ANESTHESIA:   General and paracervical block with 52ml of 0.5% Marcaine INTRAVENOUS FLUIDS:  1000 ml of LR ESTIMATED BLOOD LOSS:  Less than 20 ml SPECIMENS: none COMPLICATIONS:  None immediate  PROCEDURE DETAILS:  The patient was taken to the operating room where general anesthesia was administered and was found to be adequate.  After an adequate timeout was performed, she was placed in the dorsal lithotomy position and examined; then prepped and draped in the sterile manner.   Her bladder was catheterized for an unmeasured amount of clear, yellow urine. A speculum was then placed in the patient's vagina and a single tooth tenaculum was applied to the anterior lip of the cervix.  A paracervical block using 0.5% Marcaine was administered. The sound was used to obtain the cervical and uterine cavity length measurements at 3 cm and 63 cm respectively; total sounding length 9 cm. The NovaSure device was inserted, and a cavity width of 4.2 cm was determined. Using a power of 139 watts, for 90 sec, the endometrial ablation was performed. The tenaculum was removed from the anterior lip of the cervix, and the vaginal speculum was removed after noting good hemostasis.  The patient tolerated the procedure well and was taken to the recovery area awake,  extubated and in stable condition.  The patient will be discharged to home as per PACU criteria.  Routine postoperative instructions given.  She was prescribed Percocet, Ibuprofen and Colace.  She will follow up in the clinic in 3 weeks for postoperative evaluation.  Woodroe Mode, MD 01/02/2016 10:32 AM

## 2016-01-02 NOTE — Discharge Instructions (Addendum)
DISCHARGE INSTRUCTIONS: HYSTEROSCOPY / ENDOMETRIAL ABLATION The following instructions have been prepared to help you care for yourself upon your return home.  May Remove Scop patch on or before: Friday September 15,2017  May take Ibuprofen after: 4:15 pm today  May take stool softner while taking narcotic pain medication to prevent constipation.  Drink plenty of water.  Personal hygiene:  Use sanitary pads for vaginal drainage, not tampons.  Shower the day after your procedure.  NO tub baths, pools or Jacuzzis for 2-3 weeks.  Wipe front to back after using the bathroom.  Activity and limitations:  Do NOT drive or operate any equipment for 24 hours. The effects of anesthesia are still present and drowsiness may result.  Do NOT rest in bed all day.  Walking is encouraged.  Walk up and down stairs slowly.  You may resume your normal activity in one to two days or as indicated by your physician. Sexual activity: NO intercourse for at least 2 weeks after the procedure, or as indicated by your Doctor.  Diet: Eat a light meal as desired this evening. You may resume your usual diet tomorrow.  Return to Work: You may resume your work activities in one to two days or as indicated by Marine scientist.  What to expect after your surgery: Expect to have vaginal bleeding/discharge for 2-3 days and spotting for up to 10 days. It is not unusual to have soreness for up to 1-2 weeks. You may have a slight burning sensation when you urinate for the first day. Mild cramps may continue for a couple of days. You may have a regular period in 2-6 weeks.  Call your doctor for any of the following:  Excessive vaginal bleeding or clotting, saturating and changing one pad every hour.  Inability to urinate 6 hours after discharge from hospital.  Pain not relieved by pain medication.  Fever of 100.4 F or greater.  Unusual vaginal discharge or odor.  Return to office _________________Call for  an appointment ___________________ Patients signature: ______________________ Nurses signature ________________________  Post Anesthesia Care Unit [phone] Post Anesthesia Home Care Instructions  Activity: Get plenty of rest for the remainder of the day. A responsible adult should stay with you for 24 hours following the procedure.  For the next 24 hours, DO NOT: -Drive a car -Paediatric nurse -Drink alcoholic beverages -Take any medication unless instructed by your physician -Make any legal decisions or sign important papers.  Meals: Start with liquid foods such as gelatin or soup. Progress to regular foods as tolerated. Avoid greasy, spicy, heavy foods. If nausea and/or vomiting occur, drink only clear liquids until the nausea and/or vomiting subsides. Call your physician if vomiting continues.  Special Instructions/Symptoms: Your throat may feel dry or sore from the anesthesia or the breathing tube placed in your throat during surgery. If this causes discomfort, gargle with warm salt water. The discomfort should disappear within 24 hours.  If you had a scopolamine patch placed behind your ear for the management of post- operative nausea and/or vomiting:  1. The medication in the patch is effective for 72 hours, after which it should be removed.  Wrap patch in a tissue and discard in the trash. Wash hands thoroughly with soap and water. 2. You may remove the patch earlier than 72 hours if you experience unpleasant side effects which may include dry mouth, dizziness or visual disturbances. 3. Avoid touching the patch. Wash your hands with soap and water after contact with the patch.  Endometrial Ablation Endometrial ablation removes the lining of the uterus (endometrium). It is usually a same-day, outpatient treatment. Ablation helps avoid major surgery, such as surgery to remove the cervix and uterus (hysterectomy). After endometrial ablation, you will have little or no  menstrual bleeding and may not be able to have children. However, if you are premenopausal, you will need to use a reliable method of birth control following the procedure because of the small chance that pregnancy can occur. There are different reasons to have this procedure. These reasons include:  Heavy periods.  Bleeding that is causing anemia.  Irregular bleeding.  Bleeding fibroids on the lining inside the uterus if they are smaller than 3 centimeters. This procedure may not be possible for you if:   You want to have children in the future.   You have severe cramps with your menstrual period.   You have precancerous or cancerous cells in your uterus.   You were recently pregnant.   You have gone through menopause.   You have had major surgery on your uterus, resulting in thinning of the uterine wall. Surgeries may include:  The removal of one or more uterine fibroids (myomectomy).  A cesarean section with a classic (vertical) incision on your uterus. Ask your health care provider what type of cesarean you had. Sometimes the scar on your skin is different than the scar on your uterus. Even if you have had surgery on your uterus, certain types of ablation may still be safe for you. Talk with your health care provider. LET [facility] CARE PROVIDER KNOW ABOUT:  Any allergies you have.  All medicines you are taking, including vitamins, herbs, eye drops, creams, and over-the-counter medicines.  Previous problems you or members of your family have had with the use of anesthetics.  Any blood disorders you have.  Previous surgeries you have had.  Medical conditions you have. RISKS AND COMPLICATIONS  Generally, this is a safe procedure. However, as with any procedure, complications can occur. Possible complications include:  Perforation of the uterus.  Bleeding.  Infection of the uterus, bladder, or vagina.  Injury to surrounding organs.  An air bubble to the  lung (air embolus).  Pregnancy following the procedure.  Failure of the procedure to help the problem, requiring hysterectomy.  Decreased ability to diagnose cancer in the lining of the uterus. BEFORE THE PROCEDURE  The lining of the uterus must be tested to make sure there is no pre-cancerous or cancer cells present.  An ultrasound may be performed to look at the size of the uterus and to check for abnormalities.  Medicines may be given to thin the lining of the uterus. PROCEDURE  During the procedure, your health care provider will use a tool called a resectoscope to help see inside your uterus. There are different ways to remove the lining of your uterus.   Radiofrequency - This method uses a radiofrequency-alternating electric current to remove the lining of the uterus.  Cryotherapy - This method uses extreme cold to freeze the lining of the uterus.  Heated-Free Liquid - This method uses heated salt (saline) solution to remove the lining of the uterus.  Microwave - This method uses high-energy microwaves to heat up the lining of the uterus to remove it.  Thermal balloon - This method involves inserting a catheter with a balloon tip into the uterus. The balloon tip is filled with heated fluid to remove the lining of the uterus. AFTER THE PROCEDURE  After your procedure,  do not have sexual intercourse or insert anything into your vagina until permitted by your health care provider. After the procedure, you may experience:  Cramps.  Vaginal discharge.  Frequent urination.   This information is not intended to replace advice given to you by your health care provider. Make sure you discuss any questions you have with your health care provider.   Document Released: 02/16/2004 Document Revised: 12/28/2014 Document Reviewed: 09/09/2012 Elsevier Interactive Patient Education [employer].

## 2016-01-02 NOTE — Anesthesia Procedure Notes (Addendum)
Procedure Name: LMA Insertion Date/Time: 01/02/2016 9:53 AM Performed by: Georgeanne Nim Pre-anesthesia Checklist: Patient identified, Suction available, Emergency Drugs available, Patient being monitored and Timeout performed Patient Re-evaluated:Patient Re-evaluated prior to inductionOxygen Delivery Method: Circle system utilized Preoxygenation: Pre-oxygenation with 100% oxygen Intubation Type: IV induction Ventilation: Mask ventilation without difficulty LMA: LMA inserted LMA Size: 4.0 Number of attempts: 1 Placement Confirmation: positive ETCO2,  CO2 detector and breath sounds checked- equal and bilateral Tube secured with: Tape Dental Injury: Teeth and Oropharynx as per pre-operative assessment

## 2016-01-03 ENCOUNTER — Encounter (HOSPITAL_COMMUNITY): Payer: Self-pay | Admitting: Obstetrics & Gynecology

## 2016-01-04 ENCOUNTER — Ambulatory Visit: Payer: Self-pay | Admitting: Obstetrics & Gynecology

## 2016-01-15 ENCOUNTER — Telehealth (HOSPITAL_COMMUNITY): Payer: Self-pay

## 2016-01-15 ENCOUNTER — Other Ambulatory Visit (HOSPITAL_COMMUNITY): Payer: Self-pay

## 2016-01-15 DIAGNOSIS — F3160 Bipolar disorder, current episode mixed, unspecified: Secondary | ICD-10-CM

## 2016-01-16 ENCOUNTER — Encounter (HOSPITAL_COMMUNITY): Payer: Self-pay

## 2016-01-16 MED ORDER — HYDROXYZINE PAMOATE 25 MG PO CAPS: ORAL_CAPSULE | ORAL | 0 refills | Status: DC

## 2016-01-16 MED ORDER — ARIPIPRAZOLE 10 MG PO TABS: 10.0000 mg | ORAL_TABLET | Freq: Every day | ORAL | 0 refills | Status: DC

## 2016-01-16 NOTE — Telephone Encounter (Signed)
Pt called for a refill for Abilify and Vistaril. Per Dr. Adele Schilder, refills are authorized for Abilify 10mg , #30 and Vistaril 25mg , #30. Rx were sent to [facility] ([place] Pt is schedule for a f/u appt on 03/28/16. Called and informed pt of refill  Status. Pt verbalizes understanding.

## 2016-01-17 ENCOUNTER — Ambulatory Visit (HOSPITAL_COMMUNITY): Payer: Self-pay | Admitting: Psychiatry

## 2016-01-26 ENCOUNTER — Ambulatory Visit (INDEPENDENT_AMBULATORY_CARE_PROVIDER_SITE_OTHER): Payer: PRIVATE HEALTH INSURANCE | Admitting: Obstetrics & Gynecology

## 2016-01-26 ENCOUNTER — Encounter: Payer: Self-pay | Admitting: Obstetrics & Gynecology

## 2016-01-26 VITALS — BP 97/63 | HR 66 | Wt 123.4 lb

## 2016-01-26 DIAGNOSIS — N920 Excessive and frequent menstruation with regular cycle: Secondary | ICD-10-CM

## 2016-01-26 DIAGNOSIS — Z09 Encounter for follow-up examination after completed treatment for conditions other than malignant neoplasm: Secondary | ICD-10-CM

## 2016-01-26 NOTE — Progress Notes (Signed)
Subjective:     Nashea Cody is a 40 y.o. female who presents to the clinic 3 weeks status post endometrial ablation NovaSure for abnormal uterine bleeding. Eating a regular diet without difficulty. Bowel movements are normal. The patient is not having any pain.  The following portions of the patient's history were reviewed and updated as appropriate: allergies, current medications, past family history, past medical history, past social history, past surgical history and problem list.  Review of Systems She has a thin vaginal discharge    Objective:    BP 97/63   Pulse 66   Wt 123 lb 6.4 oz (56 kg)   LMP 12/18/2015 (Approximate)   BMI 21.18 kg/m  General:  alert, cooperative and no distress  Abdomen: soft, non-tender   Assessment:    Doing well postoperatively. Operative findings again reviewed.     Plan:    1. Continue any current medications. 2. Wound care discussed. 3. Activity restrictions: none 4. Anticipated return to work: not applicable. 5. Follow up 05/2017 annual  Woodroe Mode, MD 01/26/2016

## 2016-01-26 NOTE — Patient Instructions (Signed)
Endometrial Ablation °Endometrial ablation removes the lining of the uterus (endometrium). It is usually a same-day, outpatient treatment. Ablation helps avoid major surgery, such as surgery to remove the cervix and uterus (hysterectomy). After endometrial ablation, you will have little or no menstrual bleeding and may not be able to have children. However, if you are premenopausal, you will need to use a reliable method of birth control following the procedure because of the small chance that pregnancy can occur. °There are different reasons to have this procedure. These reasons include: °· Heavy periods. °· Bleeding that is causing anemia. °· Irregular bleeding. °· Bleeding fibroids on the lining inside the uterus if they are smaller than 3 centimeters. °This procedure may not be possible for you if:  °· You want to have children in the future.   °· You have severe cramps with your menstrual period.   °· You have precancerous or cancerous cells in your uterus.   °· You were recently pregnant.   °· You have gone through menopause.   °· You have had major surgery on your uterus, resulting in thinning of the uterine wall. Surgeries may include: °¨ The removal of one or more uterine fibroids (myomectomy). °¨ A cesarean section with a classic (vertical) incision on your uterus. Ask your health care provider what type of cesarean you had. Sometimes the scar on your skin is different than the scar on your uterus. °Even if you have had surgery on your uterus, certain types of ablation may still be safe for you. Talk with your health care provider. °LET YOUR HEALTH CARE PROVIDER KNOW ABOUT: °· Any allergies you have. °· All medicines you are taking, including vitamins, herbs, eye drops, creams, and over-the-counter medicines. °· Previous problems you or members of your family have had with the use of anesthetics. °· Any blood disorders you have. °· Previous surgeries you have had. °· Medical conditions you have. °RISKS AND  COMPLICATIONS  °Generally, this is a safe procedure. However, as with any procedure, complications can occur. Possible complications include: °· Perforation of the uterus. °· Bleeding. °· Infection of the uterus, bladder, or vagina. °· Injury to surrounding organs. °· An air bubble to the lung (air embolus). °· Pregnancy following the procedure. °· Failure of the procedure to help the problem, requiring hysterectomy. °· Decreased ability to diagnose cancer in the lining of the uterus. °BEFORE THE PROCEDURE °· The lining of the uterus must be tested to make sure there is no pre-cancerous or cancer cells present. °· An ultrasound may be performed to look at the size of the uterus and to check for abnormalities. °· Medicines may be given to thin the lining of the uterus. °PROCEDURE  °During the procedure, your health care provider will use a tool called a resectoscope to help see inside your uterus. There are different ways to remove the lining of your uterus.  °· Radiofrequency - This method uses a radiofrequency-alternating electric current to remove the lining of the uterus. °· Cryotherapy - This method uses extreme cold to freeze the lining of the uterus. °· Heated-Free Liquid - This method uses heated salt (saline) solution to remove the lining of the uterus. °· Microwave - This method uses high-energy microwaves to heat up the lining of the uterus to remove it. °· Thermal balloon - This method involves inserting a catheter with a balloon tip into the uterus. The balloon tip is filled with heated fluid to remove the lining of the uterus. °AFTER THE PROCEDURE  °After your procedure, do   not have sexual intercourse or insert anything into your vagina until permitted by your health care provider. After the procedure, you may experience: °· Cramps. °· Vaginal discharge. °· Frequent urination. °  °This information is not intended to replace advice given to you by your health care provider. Make sure you discuss any  questions you have with your health care provider. °  °Document Released: 02/16/2004 Document Revised: 12/28/2014 Document Reviewed: 09/09/2012 °Elsevier Interactive Patient Education ©2016 Else[employer] Inc[employer] ° °

## 2016-02-14 ENCOUNTER — Other Ambulatory Visit (HOSPITAL_COMMUNITY): Payer: Self-pay | Admitting: Psychiatry

## 2016-02-14 DIAGNOSIS — F3160 Bipolar disorder, current episode mixed, unspecified: Secondary | ICD-10-CM

## 2016-03-18 ENCOUNTER — Other Ambulatory Visit (HOSPITAL_COMMUNITY): Payer: Self-pay | Admitting: Psychiatry

## 2016-03-18 DIAGNOSIS — F3160 Bipolar disorder, current episode mixed, unspecified: Secondary | ICD-10-CM

## 2016-03-21 ENCOUNTER — Other Ambulatory Visit (HOSPITAL_COMMUNITY): Payer: Self-pay | Admitting: Psychiatry

## 2016-03-21 NOTE — Telephone Encounter (Signed)
Met with Dr. Adele Schilder who approved both refills for one time of patient's prescribed Abilify 10 mg and Hydroxyzine 25 mg.  Both orders e-scribed as ordred with notation of no further orders until patient evaluated to patient's [facility] on [address] and patient to keep appointment 03/28/16.

## 2016-03-21 NOTE — Telephone Encounter (Signed)
Medication management - Fax requests for refills of patient's Hydroxyzine and Abilify received from her pharmacy.  Pt. last seen 08/29/15 and was rescheduled 11/29/15 and 01/17/16 due to provider out.  Appointment is set for 03/28/16.  Last orders 1 time 02/14/16.

## 2016-03-28 ENCOUNTER — Ambulatory Visit (INDEPENDENT_AMBULATORY_CARE_PROVIDER_SITE_OTHER): Payer: Self-pay | Admitting: Psychiatry

## 2016-03-28 ENCOUNTER — Encounter (HOSPITAL_COMMUNITY): Payer: Self-pay | Admitting: Psychiatry

## 2016-03-28 DIAGNOSIS — Z8249 Family history of ischemic heart disease and other diseases of the circulatory system: Secondary | ICD-10-CM

## 2016-03-28 DIAGNOSIS — Z823 Family history of stroke: Secondary | ICD-10-CM

## 2016-03-28 DIAGNOSIS — Z803 Family history of malignant neoplasm of breast: Secondary | ICD-10-CM

## 2016-03-28 DIAGNOSIS — Z811 Family history of alcohol abuse and dependence: Secondary | ICD-10-CM

## 2016-03-28 DIAGNOSIS — Z888 Allergy status to other drugs, medicaments and biological substances status: Secondary | ICD-10-CM

## 2016-03-28 DIAGNOSIS — Z813 Family history of other psychoactive substance abuse and dependence: Secondary | ICD-10-CM

## 2016-03-28 DIAGNOSIS — Z79899 Other long term (current) drug therapy: Secondary | ICD-10-CM

## 2016-03-28 DIAGNOSIS — Z9889 Other specified postprocedural states: Secondary | ICD-10-CM

## 2016-03-28 DIAGNOSIS — F3160 Bipolar disorder, current episode mixed, unspecified: Secondary | ICD-10-CM

## 2016-03-28 MED ORDER — ARIPIPRAZOLE 15 MG PO TABS: 15.0000 mg | ORAL_TABLET | Freq: Every day | ORAL | 2 refills | Status: DC

## 2016-03-28 MED ORDER — HYDROXYZINE PAMOATE 25 MG PO CAPS: ORAL_CAPSULE | ORAL | 2 refills | Status: DC

## 2016-03-28 NOTE — Progress Notes (Signed)
[facility] MD/PA/NP OP Progress Note  03/28/2016 11:00 AM Bethany [name]  MRN:  [id number]  Chief Complaint:  Chief Complaint    Follow-up; Anxiety     Subjective:  I'm feeling more anxious and nervous on road.  I get irritable.  I think my Abilify need to go up.  HPI: Bethany [name] came for her follow-up appointment.  She is taking medication and reported no side effects.  She sleeping good and rarely takes trazodone.  She did like Vistaril which is helping her anxiety and sleep which she takes at bedtime.  She has noticed lately more irritable anxious on road.  She has to travel [place] to see her daughter a few times and she was not happy on the road.  She is taking Abilify 10 mg and she has no tremors shakes or any EPS.  She denies any paranoia or any hallucination.  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  She really likes her job.  She lives with her husband who is supportive.  Patient denies drinking alcohol or using any illegal substances.  Patient denies any paranoia, hallucination, suicidal thoughts or any feeling of hopelessness.  Her appetite is okay.  Vital signs are stable.  Visit Diagnosis:    ICD-9-CM ICD-10-CM   1. Mixed bipolar I disorder (HCC) 296.60 F31.60 hydrOXYzine (VISTARIL) 25 MG capsule     ARIPiprazole (ABILIFY) 15 MG tablet    Past Psychiatric History: Reviewed.  Past Medical History:  Past Medical History:  Diagnosis Date  . Anemia   . Anxiety   . Depression   . History of cervical dysplasia    laser surgery  . Insomnia   . Vaginal Pap smear, abnormal     Past Surgical History:  Procedure Laterality Date  . BREAST FIBROADENOMA SURGERY  2011, 2008   benign   . CESAREAN SECTION  1995  . HYSTEROSCOPY WITH NOVASURE N/A 01/02/2016   Procedure: NOVASURE;  Surgeon: Woodroe Mode, MD;  Location: [facility];  Service: Gynecology;  Laterality: N/A;  . TUBAL LIGATION  2003    Family Psychiatric History: See below.  Family History:  Family History   Problem Relation Age of Onset  . Anxiety disorder Father   . Depression Father   . Diabetes Maternal Grandmother   . Hypertension Paternal Grandfather   . Cancer Maternal Aunt     breast  . Alcohol abuse Maternal Uncle   . Alcohol abuse Cousin   . Drug abuse Cousin   . Stroke Neg Hx     Social History:  Social History   Social History  . Marital status: Married    Spouse name: N/A  . Number of children: N/A  . Years of education: N/A   Social History Main Topics  . Smoking status: Never Smoker  . Smokeless tobacco: Never Used  . Alcohol use No  . Drug use: No  . Sexual activity: Yes    Birth control/ protection: Surgical   Other Topics Concern  . None   Social History Narrative   Married, 3 daughters.  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          Allergies:  Allergies  Allergen Reactions  . Lamictal [Lamotrigine] Rash  . Codeine Hives  . Oxycodone Hcl Hives    Metabolic Disorder Labs: Lab Results  Component Value Date   HGBA1C 5.4 09/19/2010   No results found for: PROLACTIN Lab Results  Component Value Date  CHOL 187 10/01/2011   TRIG 64 10/01/2011   HDL 68 10/01/2011   CHOLHDL 2.8 10/01/2011   VLDL 13 10/01/2011   LDLCALC 106 (H) 10/01/2011   LDLCALC 109 (H) 10/07/2007     Current Medications: Current Outpatient Prescriptions  Medication Sig Dispense Refill  . acetaminophen (TYLENOL) 500 MG tablet Take 1,000 mg by mouth every 6 (six) hours as needed for moderate pain.    . ARIPiprazole (ABILIFY) 15 MG tablet Take 1 tablet (15 mg total) by mouth at bedtime. 30 tablet 2  . hydrOXYzine (VISTARIL) 25 MG capsule TAKE 1 CAPSULE (25 MG TOTAL) BY MOUTH AT BEDTIME AS NEEDED FOR ANXIETY. 30 capsule 2  . ibuprofen (ADVIL,MOTRIN) 800 MG tablet Take 1 tablet (800 mg total) by mouth every 8 (eight) hours as needed for moderate pain. 30 tablet 1  . traZODone (DESYREL) 50 MG tablet Take 1 tablet (50 mg total) by mouth at bedtime. 30  tablet 0   No current facility-administered medications for this visit.     Neurologic: Headache: No Seizure: No Paresthesias: No  Musculoskeletal: Strength & Muscle Tone: within normal limits Gait & Station: normal Patient leans: N/A  Psychiatric Specialty Exam: Review of Systems  Constitutional: Negative.   HENT: Negative.   Musculoskeletal: Negative.   Skin: Negative.     Blood pressure 122/64, pulse 73, height 5\' 4"  (1.626 m), weight 123 lb 6.4 oz (56 kg).Body mass index is 21.18 kg/m.  General Appearance: Casual  Eye Contact:  Good  Speech:  Clear and Coherent  Volume:  Normal  Mood:  Anxious  Affect:  Appropriate and Congruent  Thought Process:  Coherent and Goal Directed  Orientation:  Full (Time, Place, and Person)  Thought Content: Logical   Suicidal Thoughts:  No  Homicidal Thoughts:  No  Memory:  Immediate;   Good  Judgement:  Good  Insight:  Good  Psychomotor Activity:  Normal  Concentration:  Concentration: Good and Attention Span: Good  Recall:  Good  Fund of Knowledge: Good  Language: Good  Akathisia:  No  Handed:  Right  AIMS (if indicated):  None reported   Assets:  Communication Skills Desire for Improvement Housing Physical Health Social Support  ADL's:  Intact  Cognition: WNL  Sleep:  Good      Assessment ; Bethany [name] was 40 year old African-American female with diagnoses of bipolar disorder came for her follow-up appointment.  Plan; She is experiencing increased irritability and frustration and anxiety while driving.  She like to increase Abilify.  At this time he does not have any side effects including tremors shakes or any EPS.  I will increase Abilify 50 mg at bedtime.  Continue Vistaril 25 mg at bedtime.  She does not need a new prescription of trazodone at this time as she is only taking 1 to 2 times every 2 weeks.  Discussed medication side effects and benefits.  Recommended to call us back if she has any question or any concern.   Reminded that increase Abilify may cause muscle stiffness and tremors.  Follow-up in 3 months.   Bethany [name] T., MD 03/28/2016, 11:00 AM

## 2016-04-19 ENCOUNTER — Other Ambulatory Visit (HOSPITAL_COMMUNITY): Payer: Self-pay | Admitting: Psychiatry

## 2016-04-19 DIAGNOSIS — F3160 Bipolar disorder, current episode mixed, unspecified: Secondary | ICD-10-CM

## 2016-04-22 ENCOUNTER — Other Ambulatory Visit (HOSPITAL_COMMUNITY): Payer: Self-pay | Admitting: Psychiatry

## 2016-04-22 DIAGNOSIS — F3160 Bipolar disorder, current episode mixed, unspecified: Secondary | ICD-10-CM

## 2016-05-10 ENCOUNTER — Encounter: Payer: Self-pay | Admitting: Student in an Organized Health Care Education/Training Program

## 2016-05-10 ENCOUNTER — Ambulatory Visit (INDEPENDENT_AMBULATORY_CARE_PROVIDER_SITE_OTHER): Payer: PRIVATE HEALTH INSURANCE | Admitting: Student in an Organized Health Care Education/Training Program

## 2016-05-10 DIAGNOSIS — R112 Nausea with vomiting, unspecified: Secondary | ICD-10-CM | POA: Diagnosis not present

## 2016-05-10 MED ORDER — PROMETHAZINE HCL 12.5 MG PO TABS: 12.5000 mg | ORAL_TABLET | Freq: Three times a day (TID) | ORAL | 0 refills | Status: DC | PRN

## 2016-05-10 NOTE — Patient Instructions (Signed)
It was a pleasure seeing you today in our clinic. Today we discussed your nausea and vomiting. Here is the treatment plan we have discussed and agreed upon together:  - Your symptoms may be caused by food poisoning or by a virus.  It is likely not flu because you are vaccinated and not having fevers. - Please try to stay hydrated.  Try to take 1-2 sips of water ever 10-15 minutes as tolerated. - I sent a prescription to your pharmacy that you can use as needed for nausea - If your symptoms worsen or fail to improve, if you are not able to keep down fluids to stay hydrated, these would be reasons to call our office back or come back in to be evaluated.  Our clinic's number is [phone]. Please call with questions or concerns about what we discussed today.  Be well, Dr. Burr Medico

## 2016-05-10 NOTE — Assessment & Plan Note (Signed)
-   2/2 food poisoning vs. Viral gastroenteritis, less likely flu given no fevers and vaccination history - patient nontoxic and hemodynamically stable - prescribed phenergan PRN nausea/vomiting, recommended staying hydrated - return precautions advised

## 2016-05-10 NOTE — Progress Notes (Signed)
   CC: Vomiting  HPI: Bethany [name] is a 41 y.o. female with PMH significant for migraine and episodic mood disorder who presents to [facility] today with nausea and vomiting of 18 hours duration.  Home medications include Aripiprazole (Abilify) 15 mg qhs, and Trazodone 50 mg qhs.  Nausea/vomiting - Onset last night at 8 pm, has had 10 episodes of NBNB vomiting. - Ate soup and salad at work around 5 PM prior to getting sick. A coworker ate the same meal and also got sick with the same symptoms. - Today patient has been able to keep down apple sauce, coke (8oz), crackers, has not tolerated water.  Last episode of emesis 5 am. 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  - +chills, +muscle aches, night sweats - +cough improved with clariton, no congestion, no sore throat, no ear pain - patient did receive flu shot   Review of Symptoms:  See HPI for ROS.   CC, SH/smoking status, and VS noted. MAP 67  Objective: BP 96/63   Pulse 93   Temp 98.7 F (37.1 C) (Oral)   Wt 127 lb (57.6 kg)   SpO2 98%   BMI 21.80 kg/m  GEN: NAD, alert, cooperative, and pleasant EYE: no conjunctival injection, pupils equally round and reactive to light ENMT: normal tympanic light reflex, no nasal polyps,no rhinorrhea, no pharyngeal erythema or exudates, mucous membranes moist NECK: full ROM, no thyromegaly, no cervical lymphadenopathy RESPIRATORY: clear to auscultation bilaterally with no wheezes, rhonchi or rales, good effort CV: RRR, no m/r/g, no peripheral edema, cap refill <3s GI: soft, non-tender, non-distended, normoactive bowel sounds, no hepatosplenomegaly SKIN: warm and dry, no rashes or lesions   Assessment and plan:  Nausea with vomiting - 2/2 food poisoning vs. Viral gastroenteritis, less likely flu given no fevers and vaccination history - patient nontoxic  and hemodynamically stable - prescribed phenergan PRN nausea/vomiting, recommended staying hydrated - return precautions advised   Meds ordered this encounter  Medications  . promethazine (PHENERGAN) 12.5 MG tablet    Sig: Take 1 tablet (12.5 mg total) by mouth every 8 (eight) hours as needed for nausea or vomiting.    Dispense:  9 tablet    Refill:  0    Everrett Coombe, MD,MS,  PGY1 05/10/2016 2:07 PM

## 2016-06-27 ENCOUNTER — Ambulatory Visit (INDEPENDENT_AMBULATORY_CARE_PROVIDER_SITE_OTHER): Payer: PRIVATE HEALTH INSURANCE | Admitting: Psychiatry

## 2016-06-27 ENCOUNTER — Encounter (HOSPITAL_COMMUNITY): Payer: Self-pay | Admitting: Psychiatry

## 2016-06-27 DIAGNOSIS — Z79899 Other long term (current) drug therapy: Secondary | ICD-10-CM

## 2016-06-27 DIAGNOSIS — F3160 Bipolar disorder, current episode mixed, unspecified: Secondary | ICD-10-CM

## 2016-06-27 DIAGNOSIS — Z813 Family history of other psychoactive substance abuse and dependence: Secondary | ICD-10-CM | POA: Diagnosis not present

## 2016-06-27 DIAGNOSIS — Z818 Family history of other mental and behavioral disorders: Secondary | ICD-10-CM | POA: Diagnosis not present

## 2016-06-27 DIAGNOSIS — Z888 Allergy status to other drugs, medicaments and biological substances status: Secondary | ICD-10-CM | POA: Diagnosis not present

## 2016-06-27 DIAGNOSIS — Z811 Family history of alcohol abuse and dependence: Secondary | ICD-10-CM

## 2016-06-27 MED ORDER — ARIPIPRAZOLE 15 MG PO TABS: 15.0000 mg | ORAL_TABLET | Freq: Every day | ORAL | 2 refills | Status: DC

## 2016-06-27 MED ORDER — TRAZODONE HCL 50 MG PO TABS: 50.0000 mg | ORAL_TABLET | Freq: Every day | ORAL | 0 refills | Status: DC

## 2016-06-27 MED ORDER — HYDROXYZINE PAMOATE 25 MG PO CAPS: ORAL_CAPSULE | ORAL | 2 refills | Status: DC

## 2016-06-27 NOTE — Progress Notes (Signed)
BH MD/PA/NP OP Progress Note  06/27/2016 10:33 AM Bethany [name]  MRN:  [id number]  Chief Complaint:  Subjective:  I like increase Abilify.  It is helping my irritability.  HPI: Bethany [name] came for her follow-up appointment.  On her last visit we increase Abilify and now she is taking 15 mg daily.  She is tolerating well.  She denies any side effects including any tremors or shakes.  She admitted her irritability is much better.  She sleeping good.  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  Her appetite is okay.  Her vital signs are stable.  She lives with her husband who is very supportive.  Patient denies drinking alcohol or using any illegal substances.  She denies any paranoia, hallucination, suicidal thoughts or homicidal thoughts.  Her energy level is good.  She wants to continue her current psychiatric medication.  She takes trazodone as needed.  Visit Diagnosis:    ICD-9-CM ICD-10-CM   1. Mixed bipolar I disorder (HCC) 296.60 F31.60 hydrOXYzine (VISTARIL) 25 MG capsule     ARIPiprazole (ABILIFY) 15 MG tablet     traZODone (DESYREL) 50 MG tablet    Past Psychiatric History: Reviewed. Patient is started seeing a therapist when she was in [facility] offer her brother killed.  She was given Zoloft and lorazepam by her primary care physician.  Patient denies any history of psychiatric inpatient treatment, suicidal attempt, psychosis or any mania.  She endorse history of irritability, anger and mood swing.  She had tried Lamictal but cause rash and it was stopped.  Past Medical History:  Past Medical History:  Diagnosis Date  . Anemia   . Anxiety   . Depression   . History of cervical dysplasia    laser surgery  . Insomnia   . Vaginal Pap smear, abnormal     Past Surgical History:  Procedure Laterality Date  . BREAST FIBROADENOMA SURGERY  2011, 2008   benign   . CESAREAN SECTION  1995  . HYSTEROSCOPY WITH NOVASURE N/A 01/02/2016   Procedure:  NOVASURE;  Surgeon: Woodroe Mode, MD;  Location: [facility];  Service: Gynecology;  Laterality: N/A;  . TUBAL LIGATION  2003    Family Psychiatric History: Reviewed.  Family History:  Family History  Problem Relation Age of Onset  . Anxiety disorder Father   . Depression Father   . Diabetes Maternal Grandmother   . Hypertension Paternal Grandfather   . Cancer Maternal Aunt     breast  . Alcohol abuse Maternal Uncle   . Alcohol abuse Cousin   . Drug abuse Cousin   . Stroke Neg Hx     Social History:  Social History   Social History  . Marital status: Married    Spouse name: N/A  . Number of children: N/A  . Years of education: N/A   Social History Main Topics  . Smoking status: Never Smoker  . Smokeless tobacco: Never Used  . Alcohol use No  . Drug use: No  . Sexual activity: Yes    Birth control/ protection: Surgical   Other Topics Concern  . Not on file   Social History Narrative   Married, 3 daughters.  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          Allergies:  Allergies  Allergen Reactions  . Lamictal [Lamotrigine] Rash  . Codeine Hives  . Oxycodone Hcl Hives  Metabolic Disorder Labs: Lab Results  Component Value Date   HGBA1C 5.4 09/19/2010   No results found for: PROLACTIN Lab Results  Component Value Date   CHOL 187 10/01/2011   TRIG 64 10/01/2011   HDL 68 10/01/2011   CHOLHDL 2.8 10/01/2011   VLDL 13 10/01/2011   LDLCALC 106 (H) 10/01/2011   LDLCALC 109 (H) 10/07/2007     Current Medications: Current Outpatient Prescriptions  Medication Sig Dispense Refill  . acetaminophen (TYLENOL) 500 MG tablet Take 1,000 mg by mouth every 6 (six) hours as needed for moderate pain.    . ARIPiprazole (ABILIFY) 15 MG tablet Take 1 tablet (15 mg total) by mouth at bedtime. 30 tablet 2  . hydrOXYzine (VISTARIL) 25 MG capsule TAKE 1 CAPSULE (25 MG TOTAL) BY MOUTH AT BEDTIME AS NEEDED FOR ANXIETY. 30 capsule 2  . ibuprofen  (ADVIL,MOTRIN) 800 MG tablet Take 1 tablet (800 mg total) by mouth every 8 (eight) hours as needed for moderate pain. 30 tablet 1  . promethazine (PHENERGAN) 12.5 MG tablet Take 1 tablet (12.5 mg total) by mouth every 8 (eight) hours as needed for nausea or vomiting. 9 tablet 0  . traZODone (DESYREL) 50 MG tablet Take 1 tablet (50 mg total) by mouth at bedtime. 30 tablet 0   No current facility-administered medications for this visit.     Neurologic: Headache: No Seizure: No Paresthesias: No  Musculoskeletal: Strength & Muscle Tone: within normal limits Gait & Station: normal Patient leans: N/A  Psychiatric Specialty Exam: ROS  There were no vitals taken for this visit.There is no height or weight on file to calculate BMI.  General Appearance: Casual  Eye Contact:  Good  Speech:  Clear and Coherent  Volume:  Normal  Mood:  Euthymic  Affect:  Appropriate  Thought Process:  Coherent  Orientation:  Full (Time, Place, and Person)  Thought Content: WDL and Logical   Suicidal Thoughts:  No  Homicidal Thoughts:  No  Memory:  Immediate;   Good Recent;   Good Remote;   Good  Judgement:  Good  Insight:  Good  Psychomotor Activity:  Normal  Concentration:  Concentration: Good and Attention Span: Good  Recall:  Good  Fund of Knowledge: Good  Language: Good  Akathisia:  No  Handed:  Right  AIMS (if indicated):  0  Assets:  Communication Skills Desire for Improvement Financial Resources/Insurance [place] Talents/Skills Transportation  ADL's:  Intact  Cognition: WNL  Sleep:  good    Assessment: Bipolar disorder with mixed features.  Plan: Patient is a stable on her current psychiatric medication.  I will continue Abilify 15 mg daily, Vistaril 25 mg at bedtime and trazodone 50 mg as needed.  Follow-up in 3 months.Discussed medication side effects and benefits.  Recommended to call us back if there is any question, concern or  worsening of the symptoms.  Discuss safety plan that anytime having active suicidal thoughts or homicidal thoughts and she need to call 911 or go to the local emergency room.  Bethany [name] T., MD 06/27/2016, 10:33 AM

## 2016-09-02 ENCOUNTER — Other Ambulatory Visit: Payer: Self-pay | Admitting: Family Medicine

## 2016-09-02 DIAGNOSIS — Z1231 Encounter for screening mammogram for malignant neoplasm of breast: Secondary | ICD-10-CM

## 2016-09-03 ENCOUNTER — Ambulatory Visit (INDEPENDENT_AMBULATORY_CARE_PROVIDER_SITE_OTHER): Payer: PRIVATE HEALTH INSURANCE | Admitting: Family Medicine

## 2016-09-03 ENCOUNTER — Encounter: Payer: Self-pay | Admitting: Family Medicine

## 2016-09-03 ENCOUNTER — Other Ambulatory Visit: Payer: Self-pay | Admitting: Family Medicine

## 2016-09-03 VITALS — BP 96/62 | HR 89 | Temp 98.4°F | Ht 64.0 in | Wt 120.2 lb

## 2016-09-03 DIAGNOSIS — N63 Unspecified lump in unspecified breast: Secondary | ICD-10-CM

## 2016-09-03 DIAGNOSIS — N6320 Unspecified lump in the left breast, unspecified quadrant: Secondary | ICD-10-CM | POA: Diagnosis not present

## 2016-09-03 NOTE — Patient Instructions (Signed)
We will schedule you to have an ultrasound and mammogram done.  Take care,  Dr Jerline Pain

## 2016-09-03 NOTE — Assessment & Plan Note (Addendum)
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 Diagnostic mammogram and ultrasound ordered.

## 2016-09-03 NOTE — Progress Notes (Signed)
    Subjective:  Bethany [name] is a 41 y.o. female who presents to the [facility] today with a chief complaint of left breast mass.   HPI:  Left Breast Mass Patient noticed a small ump in her left breast about 1-2 weeks ago. The lump has not changed in size since then. No pain or drainage. No fevers or chills. No night sweats or weight loss. Patient's maternal aunt had breast cancer at age 57.   Patient's last mammogram two years ago. At that time, a cyst was found on her left breast. This cyst was aspirated and yielded clear brown colored fluid. Patient was recommended to have mammogram three months after this but as not followed up.   ROS: Per HPI  PMH: Smoking history reviewed.   Objective:  Physical Exam: BP 96/62   Pulse 89   Temp 98.4 F (36.9 C) (Oral)   Ht 5\' 4"  (1.626 m)   Wt 120 lb 3.2 oz (54.5 kg)   SpO2 97%   BMI 20.63 kg/m   Gen: NAD, resting comfortably Breast: Left breast with 8-6mm freely mobile mass at the 3 o'clock postion. Cystic changes noted under areola. Right breast without appreciable masses.  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 Diagnostic mammogram and ultrasound ordered.   Algis Greenhouse. Jerline Pain, [place] Resident PGY-3 09/03/2016 10:56 AM

## 2016-09-03 NOTE — Assessment & Plan Note (Signed)
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 Diagnostic mammogram and ultrasound ordered.

## 2016-09-09 ENCOUNTER — Ambulatory Visit
Admission: RE | Admit: 2016-09-09 | Discharge: 2016-09-09 | Disposition: A | Discharge status: Home / Self Care | Payer: PRIVATE HEALTH INSURANCE | Source: Ambulatory Visit | Attending: Family Medicine | Admitting: Family Medicine

## 2016-09-09 DIAGNOSIS — N6320 Unspecified lump in the left breast, unspecified quadrant: Secondary | ICD-10-CM

## 2016-09-30 ENCOUNTER — Ambulatory Visit (HOSPITAL_COMMUNITY): Payer: PRIVATE HEALTH INSURANCE | Admitting: Psychiatry

## 2016-11-15 ENCOUNTER — Ambulatory Visit (INDEPENDENT_AMBULATORY_CARE_PROVIDER_SITE_OTHER): Payer: PRIVATE HEALTH INSURANCE | Admitting: Student in an Organized Health Care Education/Training Program

## 2016-11-15 ENCOUNTER — Encounter: Payer: Self-pay | Admitting: Student in an Organized Health Care Education/Training Program

## 2016-11-15 VITALS — BP 110/60 | HR 68 | Temp 98.7°F | Ht 64.0 in | Wt 121.0 lb

## 2016-11-15 DIAGNOSIS — S46001A Unspecified injury of muscle(s) and tendon(s) of the rotator cuff of right shoulder, initial encounter: Secondary | ICD-10-CM | POA: Insufficient documentation

## 2016-11-15 MED ORDER — METHYLPREDNISOLONE ACETATE 40 MG/ML IJ SUSP
40.0000 mg | Freq: Once | INTRAMUSCULAR | Status: AC
  Administered 2016-11-15: 40 mg via INTRAMUSCULAR

## 2016-11-15 NOTE — Assessment & Plan Note (Signed)
-   exam and symptoms consistent with right rotator cuff pain - no red flags for joint infection - 40 mg solumedrol right shoulder joint injection today in the office - continue ibupron PRN pain - encourage continued movement at the joint - PT referral placed today - return precautions provided - follow up as needed

## 2016-11-15 NOTE — Patient Instructions (Signed)
It was a pleasure seeing you today in our clinic. Today we discussed your shoulder pain. Here is the treatment plan we have discussed and agreed upon together:  - We did a shoulder injection at today's visit with steroid. You may feel sore over the next day but please keep trying to move your arm around so that you can let this medication spread around and take effect. - If you notice redness, warmth, or fevers, please call our office back  A consult was placed to PT at today's visit.  You will receive a call to schedule an appointment. If you do not receive a call within two weeks please call our office so we can place the consult again.   Our clinic's number is [phone]. Please call with questions or concerns about what we discussed today.  Be well, Dr. Burr Medico

## 2016-11-15 NOTE — Progress Notes (Signed)
   CC:  Right shoulder pain  HPI: Bethany [name] is a 41 y.o. female who presents to [facility] today with right shoulder pain of 1 year duration.   Right shoulder pain - 1 year duration, worsening, came on suddenly when she ran into a door frame on year ago - palliated with tylenol, exercise, massage - worse with lifting arm over her head, laying on arm at night - no history of shoulder injury prior to this episode - no warmth, redness, or fevers  Review of Symptoms:  See HPI for ROS.   CC, SH/smoking status, and VS noted.  Objective: BP 110/60   Pulse 68   Temp 98.7 F (37.1 C) (Oral)   Ht 5\' 4"  (1.626 m)   Wt 121 lb (54.9 kg)   SpO2 98%   BMI 20.77 kg/m  GEN: NAD, alert, cooperative, and pleasant. MSK: +ttp over right anterior shoulder capsule, pain with internal rotation of the arm at the elbow, no pain with external rotation. Pain with active and passive abduction of the arm at the shoulder, worse when abducted past 90 degrees. No redness, warmth or other signs of infection over the shoulder joint PSYCH: AAOx3, appropriate affect  INJECTION - Right shoulder: Patient was given informed consent, signed copy in the chart. Appropriate time out was taken. Area prepped and draped in usual sterile fashion. 4 cc of methylprednisolone 40 mg/ml plus  1 cc of 1% lidocaine without epinephrine was injected into the right shoulde. The patient tolerated the procedure well. There were no complications. Post procedure instructions were given. Dr. McDiarmid present for the procedure.  Assessment and plan:  Injury of tendon of right rotator cuff - exam and symptoms consistent with right rotator cuff pain - no red flags for joint infection - 40 mg solumedrol right shoulder joint injection today in the office - continue ibupron PRN pain - encourage continued movement at the joint - PT referral placed today - return precautions provided - follow up as needed    Orders Placed This Encounter    Procedures  . Ambulatory referral to Physical Therapy    Referral Priority:   Routine    Referral Type:   Physical Medicine    Referral Reason:   Specialty Services Required    Requested Specialty:   Physical Therapy    Number of Visits Requested:   1   Everrett Coombe, MD,MS,  PGY2 11/15/2016 10:20 AM

## 2016-11-15 NOTE — Addendum Note (Signed)
Addended by: Christen Bame D on: 11/15/2016 10:25 AM   Modules accepted: Orders

## 2016-11-27 ENCOUNTER — Ambulatory Visit
Payer: PRIVATE HEALTH INSURANCE | Attending: Student in an Organized Health Care Education/Training Program | Admitting: Physical Therapy

## 2016-11-27 ENCOUNTER — Encounter: Payer: Self-pay | Admitting: Physical Therapy

## 2016-11-27 DIAGNOSIS — M25511 Pain in right shoulder: Secondary | ICD-10-CM | POA: Diagnosis not present

## 2016-11-27 DIAGNOSIS — M25611 Stiffness of right shoulder, not elsewhere classified: Secondary | ICD-10-CM | POA: Insufficient documentation

## 2016-11-27 DIAGNOSIS — M6281 Muscle weakness (generalized): Secondary | ICD-10-CM | POA: Diagnosis present

## 2016-11-27 DIAGNOSIS — G8929 Other chronic pain: Secondary | ICD-10-CM | POA: Diagnosis present

## 2016-11-27 NOTE — Therapy (Signed)
[place], [place], [address] Phone: [phone]   Fax:  [phone]  Physical Therapy Evaluation  Patient Details  Name: Bethany [name] MRN: [id number] Date of Birth: 1976-04-22 Referring Provider: Everrett Coombe, MD  Encounter Date: 11/27/2016      PT End of Session - 11/27/16 0801    Visit Number 1   Number of Visits 17   Date for PT Re-Evaluation 01/24/17   Authorization Type MEDCOST no visit limit   PT Start Time 0801   PT Stop Time 0854   PT Time Calculation (min) 53 min   Activity Tolerance Patient tolerated treatment well   Behavior During Therapy [facility] for tasks assessed/performed      Past Medical History:  Diagnosis Date  . Anemia   . Anxiety   . Depression   . History of cervical dysplasia    laser surgery  . Insomnia   . Vaginal Pap smear, abnormal     Past Surgical History:  Procedure Laterality Date  . BREAST FIBROADENOMA SURGERY  2011, 2008   benign   . CESAREAN SECTION  1995  . HYSTEROSCOPY WITH NOVASURE N/A 01/02/2016   Procedure: NOVASURE;  Surgeon: Woodroe Mode, MD;  Location: [facility];  Service: Gynecology;  Laterality: N/A;  . TUBAL LIGATION  2003    There were no vitals filed for this visit.       Subjective Assessment - 11/27/16 0803    Subjective Last year walked into a wall. pain has improved since but still has pain. Uses it but not as able as she is to use L. Unable to french braid hair or reach back for lotion. Steroid injection last week which has been helpful. Denies N/T. Has learned to use L arm rather than R.    Patient Stated Goals carry on in overhead bin, fix hair, back to "normal"  reach up and stretch without pain, scoop ice with Right hand   Currently in Pain? No/denies            [facility] PT Assessment - 11/27/16 0001      Assessment   Medical Diagnosis R rotator cuff injury   Referring Provider Everrett Coombe, MD   Onset Date/Surgical Date --   1year ago   Hand Dominance Right   Next MD Visit PRN   Prior Therapy no     Precautions   Precautions None     Restrictions   Weight Bearing Restrictions No     Balance Screen   Has the patient fallen in the past 6 months No     [place] residence     Prior Function   Level of Independence Independent   Vocation Requirements CNA- lifting, moving     Cognition   Overall Cognitive Status Within Functional Limits for tasks assessed     Observation/Other Assessments   Focus on Therapeutic Outcomes (FOTO)  52% limited     Sensation   Additional Comments WFL     ROM / Strength   AROM / PROM / Strength AROM;Strength     AROM   AROM Assessment Site Shoulder   Right/Left Shoulder Right;Left   Right Shoulder Extension 34 Degrees   Right Shoulder Flexion 119 Degrees   Right Shoulder ABduction 135 Degrees   Left Shoulder Extension 34 Degrees   Left Shoulder Flexion 152 Degrees   Left Shoulder ABduction 165 Degrees     Strength   Strength Assessment  Site Shoulder   Right/Left Shoulder Right   Right Shoulder Flexion 4-/5   Right Shoulder ABduction 3+/5   Right Shoulder Internal Rotation 4-/5   Right Shoulder External Rotation 4-/5     Palpation   Palpation comment TTP at lateral insertion of all 4 RC             Objective measurements completed on examination: See above findings.          [place] Adult PT Treatment/Exercise - 11/27/16 0001      Exercises   Exercises Shoulder     Shoulder Exercises: Seated   Theraband Level (Shoulder External Rotation) Level 2 (Red)   External Rotation Limitations cues for form   Other Seated Exercises thoracic extension over chair     Shoulder Exercises: Stretch   Table Stretch -Flexion Limitations pull from sink     Modalities   Modalities Electrical Stimulation;Moist Heat     Moist Heat Therapy   Number Minutes Moist Heat 15 Minutes  concurrent with ESTIM   Moist Heat  Location Shoulder  R     Electrical Stimulation   Electrical Stimulation Location R GHJ   Electrical Stimulation Action IFC   Electrical Stimulation Parameters 15 min, to tolerance   Electrical Stimulation Goals Pain     Manual Therapy   Manual Therapy Joint mobilization   Joint Mobilization Gr4 mobs at end range flexion & abd +ER                PT Education - 11/27/16 1314    Education provided Yes   Education Details anatomy of condition, POC, hep, exercise form/rationale   Person(s) Educated Patient   Methods Explanation;Demonstration;Tactile cues;Verbal cues;Handout   Comprehension Verbalized understanding;Returned demonstration;Verbal cues required;Tactile cues required;Need further instruction          PT Short Term Goals - 11/27/16 1318      PT SHORT TERM GOAL #1   Title Pt will be independent with HEP as it has been established.   Baseline will progress as appropriate   Time 4   Period Weeks   Status New   Target Date 12/27/16     PT SHORT TERM GOAL #2   Title pt will demo 10 deg incr in GHJ ROM   Baseline see flowsheet   Time 4   Period Weeks   Status New   Target Date 12/27/16           PT Long Term Goals - 11/27/16 1320      PT LONG TERM GOAL #1   Title fOTO to 32% limitation to indicate significant increase in functional ability   Baseline 52% limited at eval   Time 8   Period Weeks   Status New   Target Date 01/24/17     PT LONG TERM GOAL #2   Title Pt will demo ability lift overhead properly to place carry on in bin on plane, pain <=2/10   Baseline unable at eval   Time 8   Period Weeks   Status New   Target Date 01/24/17     PT LONG TERM GOAL #3   Title Pt will be able to return to using R arm for functional daily activities such as carrying her purse and scooping ice   Baseline uses L at eval, is R handed   Time 8   Period Weeks   Status New   Target Date 01/24/17     PT LONG TERM GOAL #4  Title Pt will be able to  fix her hair without being limited by ROM.    Baseline limited at eval   Time 8   Period Weeks   Status New   Target Date 01/24/17                Plan - 11/27/16 0847    Clinical Impression Statement Pt presents to PT with complaints of limited functional use of R GHJ that began about a year ago after running into a door. Stiff end feel noted in PROM in flexion, abd and ER consistent with s/s of adhesive capsulitis. Capsule mobilizations perofrmed today and pt reported improved movement following. Discussed soreness to be expected following mobilizations. Pt will benefit from skilled PT in order to improve functional useof GHJ and reach long term functional goals.    History and Personal Factors relevant to plan of care: anemia, anxiety, depression   Clinical Presentation Stable   Clinical Presentation due to: n/a   Clinical Decision Making Low   Rehab Potential Good   PT Frequency 2x / week   PT Duration 8 weeks   PT Treatment/Interventions ADLs/Self Care Home Management;Cryotherapy;Electrical Stimulation;Iontophoresis 4mg /ml Dexamethasone;Functional mobility training;Ultrasound;Traction;Moist Heat;Therapeutic activities;Therapeutic exercise;Neuromuscular re-education;Patient/family education;Passive range of motion;Manual techniques;Dry needling;Taping   PT Next Visit Plan GHJ ROM & exercises to maintain   PT Home Exercise Plan flexion stretch pull from sink, ER red tband, thoracic ext;    Consulted and Agree with Plan of Care Patient      Patient will benefit from skilled therapeutic intervention in order to improve the following deficits and impairments:  Decreased range of motion, Impaired UE functional use, Increased muscle spasms, Decreased activity tolerance, Pain, Impaired flexibility, Hypomobility, Decreased strength  Visit Diagnosis: Chronic right shoulder pain - Plan: PT plan of care cert/re-cert  Stiffness of right shoulder, not elsewhere classified - Plan: PT plan  of care cert/re-cert  Muscle weakness (generalized) - Plan: PT plan of care cert/re-cert     Problem List Patient Active Problem List   Diagnosis Date Noted  . Injury of tendon of right rotator cuff 11/15/2016  . Nausea with vomiting 05/10/2016  . Left flank pain 12/21/2014  . Mood disorder ([place]) 08/31/2014  . Menorrhagia 05/09/2014  . Preventative health care 05/09/2014  . Left breast mass 08/03/2013  . Migraine, chronic, without aura 11/19/2011  . Episodic mood disorder ([place]) 07/18/2008  . Lump or mass in breast 06/30/2008  . ANEMIA, IRON DEFICIENCY, HX OF 10/07/2007    Jayah Balthazar C. Dael Howland PT, DPT 11/27/16 1:27 PM   [facility] [address] [place], [place], [address] Phone: [phone]   Fax:  [phone]  Name: Bethany [name] MRN: [id number] Date of Birth: 09-Aug-1975

## 2016-12-03 ENCOUNTER — Ambulatory Visit: Payer: PRIVATE HEALTH INSURANCE | Admitting: Physical Therapy

## 2016-12-03 ENCOUNTER — Encounter: Payer: Self-pay | Admitting: Physical Therapy

## 2016-12-03 DIAGNOSIS — M6281 Muscle weakness (generalized): Secondary | ICD-10-CM

## 2016-12-03 DIAGNOSIS — M25511 Pain in right shoulder: Secondary | ICD-10-CM | POA: Diagnosis not present

## 2016-12-03 DIAGNOSIS — M25611 Stiffness of right shoulder, not elsewhere classified: Secondary | ICD-10-CM

## 2016-12-03 DIAGNOSIS — G8929 Other chronic pain: Secondary | ICD-10-CM

## 2016-12-03 NOTE — Therapy (Signed)
[place], [place], [address] Phone: [phone]   Fax:  [phone]  Physical Therapy Treatment  Patient Details  Name: Bethany [name] MRN: [id number] Date of Birth: Sep 08, 1975 Referring Provider: Everrett Coombe, MD  Encounter Date: 12/03/2016      PT End of Session - 12/03/16 0935    Visit Number 2   Number of Visits 17   Date for PT Re-Evaluation 01/24/17   Authorization Type MEDCOST no visit limit   PT Start Time 0934   PT Stop Time 1011   PT Time Calculation (min) 37 min   Activity Tolerance Patient tolerated treatment well   Behavior During Therapy [facility] for tasks assessed/performed      Past Medical History:  Diagnosis Date  . Anemia   . Anxiety   . Depression   . History of cervical dysplasia    laser surgery  . Insomnia   . Vaginal Pap smear, abnormal     Past Surgical History:  Procedure Laterality Date  . BREAST FIBROADENOMA SURGERY  2011, 2008   benign   . CESAREAN SECTION  1995  . HYSTEROSCOPY WITH NOVASURE N/A 01/02/2016   Procedure: NOVASURE;  Surgeon: Woodroe Mode, MD;  Location: [facility];  Service: Gynecology;  Laterality: N/A;  . TUBAL LIGATION  2003    There were no vitals filed for this visit.      Subjective Assessment - 12/03/16 0936    Subjective Mild soreness after last visit. Feeling pretty good today.    Patient Stated Goals carry on in overhead bin, fix hair, back to "normal"  reach up and stretch without pain, scoop ice with Right hand   Currently in Pain? Yes   Pain Score 2    Pain Location Shoulder   Pain Orientation Right;Lateral;Upper   Aggravating Factors  reaching   Pain Relieving Factors rest                         [facility] Adult PT Treatment/Exercise - 12/03/16 0001      Shoulder Exercises: Prone   Retraction 20 reps   Retraction Limitations retraction + extension    Flexion 10 reps   Flexion Weight (lbs) 0   Extension 10 reps    Horizontal ABduction 1 10 reps   Horizontal ABduction 1 Limitations attempt for thumb up     Shoulder Exercises: Standing   External Rotation 20 reps   Theraband Level (Shoulder External Rotation) Level 2 (Red)   Internal Rotation 15 reps   Theraband Level (Shoulder Internal Rotation) Level 2 (Red)     Shoulder Exercises: ROM/Strengthening   UBE (Upper Arm Bike) retro 3 min L1     Shoulder Exercises: Stretch   Other Shoulder Stretches child pose     Manual Therapy   Manual therapy comments pre manual flx 123, post 140   Joint Mobilization Gr4 mobs at end range flexion & abd +ER                  PT Short Term Goals - 11/27/16 1318      PT SHORT TERM GOAL #1   Title Pt will be independent with HEP as it has been established.   Baseline will progress as appropriate   Time 4   Period Weeks   Status New   Target Date 12/27/16     PT SHORT TERM GOAL #2   Title pt will demo 10 deg incr  in GHJ ROM   Baseline see flowsheet   Time 4   Period Weeks   Status New   Target Date 12/27/16           PT Long Term Goals - 11/27/16 1320      PT LONG TERM GOAL #1   Title fOTO to 32% limitation to indicate significant increase in functional ability   Baseline 52% limited at eval   Time 8   Period Weeks   Status New   Target Date 01/24/17     PT LONG TERM GOAL #2   Title Pt will demo ability lift overhead properly to place carry on in bin on plane, pain <=2/10   Baseline unable at eval   Time 8   Period Weeks   Status New   Target Date 01/24/17     PT LONG TERM GOAL #3   Title Pt will be able to return to using R arm for functional daily activities such as carrying her purse and scooping ice   Baseline uses L at eval, is R handed   Time 8   Period Weeks   Status New   Target Date 01/24/17     PT LONG TERM GOAL #4   Title Pt will be able to fix her hair without being limited by ROM.    Baseline limited at eval   Time 8   Period Weeks   Status New    Target Date 01/24/17               Plan - 12/03/16 1011    Clinical Impression Statement Singificant improvement in flexion ROM (123 to 140) with GHJ capsule mobs today. Diminished pinching sensation at superior aspect of shoulder, cuing required to avoid elevation and activate periscapular musculature.    PT Treatment/Interventions ADLs/Self Care Home Management;Cryotherapy;Electrical Stimulation;Iontophoresis 4mg /ml Dexamethasone;Functional mobility training;Ultrasound;Traction;Moist Heat;Therapeutic activities;Therapeutic exercise;Neuromuscular re-education;Patient/family education;Passive range of motion;Manual techniques;Dry needling;Taping   PT Next Visit Plan GHJ mobs, periscapular strengthening   PT Home Exercise Plan flexion stretch pull from sink, ER red tband, thoracic ext; child pose, prone retraction, horiz abd & flexion;    Consulted and Agree with Plan of Care Patient;Family member/caregiver   Family Member Consulted Mom      Patient will benefit from skilled therapeutic intervention in order to improve the following deficits and impairments:  Decreased range of motion, Impaired UE functional use, Increased muscle spasms, Decreased activity tolerance, Pain, Impaired flexibility, Hypomobility, Decreased strength  Visit Diagnosis: Chronic right shoulder pain  Stiffness of right shoulder, not elsewhere classified  Muscle weakness (generalized)     Problem List Patient Active Problem List   Diagnosis Date Noted  . Injury of tendon of right rotator cuff 11/15/2016  . Nausea with vomiting 05/10/2016  . Left flank pain 12/21/2014  . Mood disorder ([place]) 08/31/2014  . Menorrhagia 05/09/2014  . Preventative health care 05/09/2014  . Left breast mass 08/03/2013  . Migraine, chronic, without aura 11/19/2011  . Episodic mood disorder ([place]) 07/18/2008  . Lump or mass in breast 06/30/2008  . ANEMIA, IRON DEFICIENCY, HX OF 10/07/2007   Leroi Haque C. Silvina Hackleman PT,  DPT 12/03/16 1:19 PM   [facility] [address] [place], [place], [address] Phone: [phone]   Fax:  [phone]  Name: Bethany [name] MRN: [id number] Date of Birth: 1975-12-25

## 2016-12-06 ENCOUNTER — Encounter: Payer: Self-pay | Admitting: Physical Therapy

## 2016-12-06 ENCOUNTER — Ambulatory Visit: Payer: PRIVATE HEALTH INSURANCE | Admitting: Physical Therapy

## 2016-12-06 DIAGNOSIS — G8929 Other chronic pain: Secondary | ICD-10-CM

## 2016-12-06 DIAGNOSIS — M6281 Muscle weakness (generalized): Secondary | ICD-10-CM

## 2016-12-06 DIAGNOSIS — M25511 Pain in right shoulder: Secondary | ICD-10-CM | POA: Diagnosis not present

## 2016-12-06 DIAGNOSIS — M25611 Stiffness of right shoulder, not elsewhere classified: Secondary | ICD-10-CM

## 2016-12-06 NOTE — Therapy (Signed)
[place], [place], [address] Phone: [phone]   Fax:  [phone]  Physical Therapy Treatment  Patient Details  Name: Bethany [name] MRN: [id number] Date of Birth: Jul 02, 1975 Referring Provider: Everrett Coombe, MD  Encounter Date: 12/06/2016      PT End of Session - 12/06/16 1002    Visit Number 3   Number of Visits 17   Date for PT Re-Evaluation 01/24/17   Authorization Type MEDCOST no visit limit   PT Start Time 0933   PT Stop Time 1015   PT Time Calculation (min) 42 min   Activity Tolerance Patient tolerated treatment well   Behavior During Therapy [facility] for tasks assessed/performed      Past Medical History:  Diagnosis Date  . Anemia   . Anxiety   . Depression   . History of cervical dysplasia    laser surgery  . Insomnia   . Vaginal Pap smear, abnormal     Past Surgical History:  Procedure Laterality Date  . BREAST FIBROADENOMA SURGERY  2011, 2008   benign   . CESAREAN SECTION  1995  . HYSTEROSCOPY WITH NOVASURE N/A 01/02/2016   Procedure: NOVASURE;  Surgeon: Woodroe Mode, MD;  Location: [facility];  Service: Gynecology;  Laterality: N/A;  . TUBAL LIGATION  2003    There were no vitals filed for this visit.      Subjective Assessment - 12/06/16 0954    Subjective Patient reports some soreness today. She reports she flet pretty good after the last visit.    Patient Stated Goals carry on in overhead bin, fix hair, back to "normal"  reach up and stretch without pain, scoop ice with Right hand   Currently in Pain? Yes   Pain Score 2    Pain Location Shoulder   Pain Orientation Right;Upper;Lateral   Pain Descriptors / Indicators Aching   Pain Type Chronic pain   Pain Onset More than a month ago   Pain Frequency Constant   Aggravating Factors  reaching    Pain Relieving Factors rest                          [facility] Adult PT Treatment/Exercise - 12/06/16 0001      Shoulder Exercises: Prone   Retraction 20 reps   Flexion 10 reps   Extension 20 reps   Horizontal ABduction 1 10 reps     Shoulder Exercises: Standing   External Rotation 20 reps   Theraband Level (Shoulder External Rotation) Level 2 (Red)   Internal Rotation 15 reps   Theraband Level (Shoulder Internal Rotation) Level 2 (Red)   Extension 20 reps   Theraband Level (Shoulder Extension) Level 2 (Red)   Row 20 reps   Theraband Level (Shoulder Row) Level 2 (Red)     Shoulder Exercises: ROM/Strengthening   UBE (Upper Arm Bike) retro 3 min L1     Shoulder Exercises: Stretch   Other Shoulder Stretches child pose     Modalities   Modalities Cryotherapy     Moist Heat Therapy   Number Minutes Moist Heat 10 Minutes   Moist Heat Location Shoulder     Manual Therapy   Joint Mobilization grade III and IV PA AP and iferior glides                PT Education - 12/06/16 1001    Education provided Yes   Education Details reviewed the improtance  of improving ROM    Person(s) Educated Patient   Methods Explanation;Demonstration;Tactile cues;Verbal cues   Comprehension Returned demonstration;Verbal cues required;Verbalized understanding;Tactile cues required;Need further instruction          PT Short Term Goals - 12/06/16 1354      PT SHORT TERM GOAL #1   Title Pt will be independent with HEP as it has been established.   Baseline will progress as appropriate   Time 4   Period Weeks   Status On-going     PT SHORT TERM GOAL #2   Title pt will demo 10 deg incr in GHJ ROM   Baseline see flowsheet   Time 4   Period Weeks   Status On-going           PT Long Term Goals - 11/27/16 1320      PT LONG TERM GOAL #1   Title fOTO to 32% limitation to indicate significant increase in functional ability   Baseline 52% limited at eval   Time 8   Period Weeks   Status New   Target Date 01/24/17     PT LONG TERM GOAL #2   Title Pt will demo ability lift overhead  properly to place carry on in bin on plane, pain <=2/10   Baseline unable at eval   Time 8   Period Weeks   Status New   Target Date 01/24/17     PT LONG TERM GOAL #3   Title Pt will be able to return to using R arm for functional daily activities such as carrying her purse and scooping ice   Baseline uses L at eval, is R handed   Time 8   Period Weeks   Status New   Target Date 01/24/17     PT LONG TERM GOAL #4   Title Pt will be able to fix her hair without being limited by ROM.    Baseline limited at eval   Time 8   Period Weeks   Status New   Target Date 01/24/17               Plan - 12/06/16 1050    Clinical Impression Statement Patient  had a slight increase in pain with treatment. She was encouraged to ice. She agreed to try ice despite not liking the cold. Therapy focused on manual therapy to improve external rotation. She had improved external rotation after treatment.    History and Personal Factors relevant to plan of care: anemia, anxiety, depression    Clinical Presentation Stable   Clinical Decision Making Low   Rehab Potential Good   PT Frequency 2x / week   PT Duration 8 weeks   PT Treatment/Interventions ADLs/Self Care Home Management;Cryotherapy;Electrical Stimulation;Iontophoresis 4mg /ml Dexamethasone;Functional mobility training;Ultrasound;Traction;Moist Heat;Therapeutic activities;Therapeutic exercise;Neuromuscular re-education;Patient/family education;Passive range of motion;Manual techniques;Dry needling;Taping   PT Next Visit Plan GHJ mobs, periscapular strengthening   PT Home Exercise Plan flexion stretch pull from sink, ER red tband, thoracic ext; child pose, prone retraction, horiz abd & flexion;    Consulted and Agree with Plan of Care Patient;Family member/caregiver   Family Member Consulted Mom      Patient will benefit from skilled therapeutic intervention in order to improve the following deficits and impairments:  Decreased range of  motion, Impaired UE functional use, Increased muscle spasms, Decreased activity tolerance, Pain, Impaired flexibility, Hypomobility, Decreased strength  Visit Diagnosis: Chronic right shoulder pain  Stiffness of right shoulder, not elsewhere classified  Muscle weakness (generalized)  Problem List Patient Active Problem List   Diagnosis Date Noted  . Injury of tendon of right rotator cuff 11/15/2016  . Nausea with vomiting 05/10/2016  . Left flank pain 12/21/2014  . Mood disorder ([place]) 08/31/2014  . Menorrhagia 05/09/2014  . Preventative health care 05/09/2014  . Left breast mass 08/03/2013  . Migraine, chronic, without aura 11/19/2011  . Episodic mood disorder ([place]) 07/18/2008  . Lump or mass in breast 06/30/2008  . ANEMIA, IRON DEFICIENCY, HX OF 10/07/2007    Carney Living  PT DPT  12/06/2016, 1:56 PM  [facility] [address] [place], [place], [address] Phone: [phone]   Fax:  [phone]  Name: Bethany [name] MRN: [id number] Date of Birth: September 23, 1975

## 2016-12-09 ENCOUNTER — Encounter: Payer: Self-pay | Admitting: Physical Therapy

## 2016-12-09 ENCOUNTER — Ambulatory Visit: Payer: PRIVATE HEALTH INSURANCE | Admitting: Physical Therapy

## 2016-12-09 DIAGNOSIS — M25511 Pain in right shoulder: Secondary | ICD-10-CM | POA: Diagnosis not present

## 2016-12-09 DIAGNOSIS — G8929 Other chronic pain: Secondary | ICD-10-CM

## 2016-12-09 DIAGNOSIS — M6281 Muscle weakness (generalized): Secondary | ICD-10-CM

## 2016-12-09 DIAGNOSIS — M25611 Stiffness of right shoulder, not elsewhere classified: Secondary | ICD-10-CM

## 2016-12-09 NOTE — Therapy (Signed)
[place], [place], [address] Phone: [phone]   Fax:  [phone]  Physical Therapy Treatment  Patient Details  Name: Bethany [name] MRN: [id number] Date of Birth: November 21, 1975 Referring Provider: Everrett Coombe, MD  Encounter Date: 12/09/2016      PT End of Session - 12/09/16 0847    Visit Number 4   Number of Visits 17   Date for PT Re-Evaluation 01/24/17   Authorization Type MEDCOST no visit limit   PT Start Time 0847   PT Stop Time 0934   PT Time Calculation (min) 47 min   Activity Tolerance Patient tolerated treatment well;Patient limited by fatigue   Behavior During Therapy [facility] for tasks assessed/performed      Past Medical History:  Diagnosis Date  . Anemia   . Anxiety   . Depression   . History of cervical dysplasia    laser surgery  . Insomnia   . Vaginal Pap smear, abnormal     Past Surgical History:  Procedure Laterality Date  . BREAST FIBROADENOMA SURGERY  2011, 2008   benign   . CESAREAN SECTION  1995  . HYSTEROSCOPY WITH NOVASURE N/A 01/02/2016   Procedure: NOVASURE;  Surgeon: Woodroe Mode, MD;  Location: [facility];  Service: Gynecology;  Laterality: N/A;  . TUBAL LIGATION  2003    There were no vitals filed for this visit.      Subjective Assessment - 12/09/16 0847    Subjective Feels alright, denies visit. Was sore before last visit but felt better after. Feels a stretch under arm at end range flexion. Has been able to use arm more for fixing hair.    Patient Stated Goals carry on in overhead bin, fix hair, back to "normal"  reach up and stretch without pain, scoop ice with Right hand   Currently in Pain? No/denies                         [facility] Adult PT Treatment/Exercise - 12/09/16 0001      Shoulder Exercises: Supine   Flexion 15 reps   Shoulder Flexion Weight (lbs) 2   Flexion Limitations full avail range     Shoulder Exercises: Prone    Retraction 10 reps   Extension Theraband  retraction + ext     Shoulder Exercises: Sidelying   External Rotation 20 reps   External Rotation Weight (lbs) 2   External Rotation Limitations full avail range   ABduction 15 reps   ABduction Weight (lbs) 2   ABduction Limitations full avail range     Shoulder Exercises: Standing   Protraction Limitations weight through arms on table   Flexion 20 reps   Theraband Level (Shoulder Flexion) Level 4 (Blue)   Flexion Limitations punch   Row 20 reps   Theraband Level (Shoulder Row) Level 4 (Blue)   Other Standing Exercises abd + ER yellow tband 3x10     Shoulder Exercises: ROM/Strengthening   UBE (Upper Arm Bike) retro 3 min L1     Shoulder Exercises: Stretch   Table Stretch - External Rotation 3 reps;10 seconds   Other Shoulder Stretches child pose 4x3 deep breaths     Cryotherapy   Number Minutes Cryotherapy 10 Minutes  2' concurrent with education   Cryotherapy Location Shoulder   Type of Cryotherapy Ice pack     Manual Therapy   Manual Therapy Soft tissue mobilization   Joint Mobilization Gr 4 AP  at end range ER, 90 deg abd   Soft tissue mobilization STM & stretching R subscap                  PT Short Term Goals - 12/06/16 1354      PT SHORT TERM GOAL #1   Title Pt will be independent with HEP as it has been established.   Baseline will progress as appropriate   Time 4   Period Weeks   Status On-going     PT SHORT TERM GOAL #2   Title pt will demo 10 deg incr in GHJ ROM   Baseline see flowsheet   Time 4   Period Weeks   Status On-going           PT Long Term Goals - 11/27/16 1320      PT LONG TERM GOAL #1   Title fOTO to 32% limitation to indicate significant increase in functional ability   Baseline 52% limited at eval   Time 8   Period Weeks   Status New   Target Date 01/24/17     PT LONG TERM GOAL #2   Title Pt will demo ability lift overhead properly to place carry on in bin on plane,  pain <=2/10   Baseline unable at eval   Time 8   Period Weeks   Status New   Target Date 01/24/17     PT LONG TERM GOAL #3   Title Pt will be able to return to using R arm for functional daily activities such as carrying her purse and scooping ice   Baseline uses L at eval, is R handed   Time 8   Period Weeks   Status New   Target Date 01/24/17     PT LONG TERM GOAL #4   Title Pt will be able to fix her hair without being limited by ROM.    Baseline limited at eval   Time 8   Period Weeks   Status New   Target Date 01/24/17               Plan - 12/09/16 8891    Clinical Impression Statement pt fatigued very quickly with exercises, requesting to drop from 3 to 2 lb in supine flexion. Good ROM carry over from last few visist. Educated on increasing challenges in new ranges will result in some soreness and asked to try to use arm in ADLs without GHJ elevation.    PT Treatment/Interventions ADLs/Self Care Home Management;Cryotherapy;Electrical Stimulation;Iontophoresis 4mg /ml Dexamethasone;Functional mobility training;Ultrasound;Traction;Moist Heat;Therapeutic activities;Therapeutic exercise;Neuromuscular re-education;Patient/family education;Passive range of motion;Manual techniques;Dry needling;Taping   PT Next Visit Plan GHJ mobs, periscapular strengthening   PT Home Exercise Plan flexion stretch pull from sink, ER red tband, thoracic ext; child pose, prone retraction, horiz abd & flexion;    Consulted and Agree with Plan of Care Patient      Patient will benefit from skilled therapeutic intervention in order to improve the following deficits and impairments:  Decreased range of motion, Impaired UE functional use, Increased muscle spasms, Decreased activity tolerance, Pain, Impaired flexibility, Hypomobility, Decreased strength  Visit Diagnosis: Chronic right shoulder pain  Stiffness of right shoulder, not elsewhere classified  Muscle weakness  (generalized)     Problem List Patient Active Problem List   Diagnosis Date Noted  . Injury of tendon of right rotator cuff 11/15/2016  . Nausea with vomiting 05/10/2016  . Left flank pain 12/21/2014  . Mood disorder ([place]) 08/31/2014  . Menorrhagia  05/09/2014  . Preventative health care 05/09/2014  . Left breast mass 08/03/2013  . Migraine, chronic, without aura 11/19/2011  . Episodic mood disorder ([place]) 07/18/2008  . Lump or mass in breast 06/30/2008  . ANEMIA, IRON DEFICIENCY, HX OF 10/07/2007   Joeleen Wortley C. [place]ji  PT, DPT 12/09/16 9:58 AM   [facility] [address] [place], [place], [address] Phone: [phone]   Fax:  [phone]  Name: Kadynce Bonds MRN: [id number] Date of Birth: [birth year]

## 2016-12-12 ENCOUNTER — Ambulatory Visit: Payer: PRIVATE HEALTH INSURANCE | Admitting: Physical Therapy

## 2016-12-12 ENCOUNTER — Encounter: Payer: Self-pay | Admitting: Physical Therapy

## 2016-12-12 DIAGNOSIS — G8929 Other chronic pain: Secondary | ICD-10-CM

## 2016-12-12 DIAGNOSIS — M25511 Pain in right shoulder: Principal | ICD-10-CM

## 2016-12-12 DIAGNOSIS — M25611 Stiffness of right shoulder, not elsewhere classified: Secondary | ICD-10-CM

## 2016-12-12 DIAGNOSIS — M6281 Muscle weakness (generalized): Secondary | ICD-10-CM

## 2016-12-12 NOTE — Therapy (Signed)
[place], [place], [address] Phone: [phone]   Fax:  [phone]  Physical Therapy Treatment  Patient Details  Name: Bethany [name] MRN: [id number] Date of Birth: 1976/03/12 Referring Provider: Everrett Coombe, MD  Encounter Date: 12/12/2016      PT End of Session - 12/12/16 0932    Visit Number 5   Number of Visits 17   Date for PT Re-Evaluation 01/24/17   Authorization Type MEDCOST no visit limit   PT Start Time 0932   PT Stop Time 1010   PT Time Calculation (min) 38 min   Activity Tolerance Patient tolerated treatment well   Behavior During Therapy [facility] for tasks assessed/performed      Past Medical History:  Diagnosis Date  . Anemia   . Anxiety   . Depression   . History of cervical dysplasia    laser surgery  . Insomnia   . Vaginal Pap smear, abnormal     Past Surgical History:  Procedure Laterality Date  . BREAST FIBROADENOMA SURGERY  2011, 2008   benign   . CESAREAN SECTION  1995  . HYSTEROSCOPY WITH NOVASURE N/A 01/02/2016   Procedure: NOVASURE;  Surgeon: Woodroe Mode, MD;  Location: [facility];  Service: Gynecology;  Laterality: N/A;  . TUBAL LIGATION  2003    There were no vitals filed for this visit.      Subjective Assessment - 12/12/16 0932    Subjective pretty good this morning, no complaints. Only exercise she really notices difficulty with is lifting the weight.    Patient Stated Goals carry on in overhead bin, fix hair, back to "normal"  reach up and stretch without pain, scoop ice with Right hand   Currently in Pain? No/denies   Aggravating Factors  lifting weight.             [facility] PT Assessment - 12/12/16 0001      AROM   Right Shoulder Flexion 126 Degrees   Right Shoulder ABduction 132 Degrees     Strength   Right Shoulder Flexion 4+/5   Right Shoulder ABduction 4+/5   Right Shoulder Internal Rotation 5/5   Right Shoulder External Rotation 4+/5     Palpation   Palpation comment denies TTP                     [facility] Adult PT Treatment/Exercise - 12/12/16 0001      Shoulder Exercises: Supine   Protraction 20 reps;Right  2 sets   Flexion 15 reps   Shoulder Flexion Weight (lbs) 2   Other Supine Exercises flexion press out into tband     Shoulder Exercises: Prone   Horizontal ABduction 1 15 reps   Horizontal ABduction 1 Weight (lbs) 2   Other Prone Exercises row to triceps kick     Shoulder Exercises: Standing   Row 20 reps   Theraband Level (Shoulder Row) Level 4 (Blue)   Other Standing Exercises 4 way diagonals yellow tband   Other Standing Exercises wall push ups & with push away     Shoulder Exercises: ROM/Strengthening   UBE (Upper Arm Bike) 4 min L4   Other ROM/Strengthening Exercises ranger reach with 3# cuff weight                PT Education - 12/12/16 1009    Education provided Yes   Education Details discussed goals & progressions, exercise form/rationale, HEP   Person(s) Educated Patient  Methods Explanation;Demonstration;Tactile cues;Verbal cues;Handout   Comprehension Verbalized understanding;Returned demonstration;Verbal cues required;Tactile cues required;Need further instruction          PT Short Term Goals - 12/06/16 1354      PT SHORT TERM GOAL #1   Title Pt will be independent with HEP as it has been established.   Baseline will progress as appropriate   Time 4   Period Weeks   Status On-going     PT SHORT TERM GOAL #2   Title pt will demo 10 deg incr in GHJ ROM   Baseline see flowsheet   Time 4   Period Weeks   Status On-going           PT Long Term Goals - 12/12/16 1006      PT LONG TERM GOAL #1   Title fOTO to 32% limitation to indicate significant increase in functional ability   Baseline 52% limited at eval   Status Unable to assess     PT LONG TERM GOAL #2   Title Pt will demo ability lift overhead properly to place carry on in bin on plane, pain  <=2/10   Baseline not tested today   Status Unable to assess     PT LONG TERM GOAL #3   Title Pt will be able to return to using R arm for functional daily activities such as carrying her purse and scooping ice   Baseline getting back to it but still favors L   Status On-going     PT LONG TERM GOAL #4   Title Pt will be able to fix her hair without being limited by ROM.    Baseline demo ROM but has not tried to do a french braid   Status Partially Met               Plan - 12/12/16 1013    Clinical Impression Statement Good improvements in strength, demo significant MMT improvements but cont to fatigue with repetitions. Difficulty moving weight but is improving. Still lacking ROM for all activities but is able to use her arm more in ADLs without pain.    PT Treatment/Interventions ADLs/Self Care Home Management;Cryotherapy;Electrical Stimulation;Iontophoresis 59m/ml Dexamethasone;Functional mobility training;Ultrasound;Traction;Moist Heat;Therapeutic activities;Therapeutic exercise;Neuromuscular re-education;Patient/family education;Passive range of motion;Manual techniques;Dry needling;Taping   PT Next Visit Plan GHJ stretching, endurance in end ranges   PT Home Exercise Plan flexion stretch pull from sink, ER red tband, thoracic ext; child pose, prone retraction, horiz abd & flexion; 4-way GHJ diagonals;    Consulted and Agree with Plan of Care Patient      Patient will benefit from skilled therapeutic intervention in order to improve the following deficits and impairments:  Decreased range of motion, Impaired UE functional use, Increased muscle spasms, Decreased activity tolerance, Pain, Impaired flexibility, Hypomobility, Decreased strength  Visit Diagnosis: Chronic right shoulder pain  Stiffness of right shoulder, not elsewhere classified  Muscle weakness (generalized)     Problem List Patient Active Problem List   Diagnosis Date Noted  . Injury of tendon of right  rotator cuff 11/15/2016  . Nausea with vomiting 05/10/2016  . Left flank pain 12/21/2014  . Mood disorder ([place] 08/31/2014  . Menorrhagia 05/09/2014  . Preventative health care 05/09/2014  . Left breast mass 08/03/2013  . Migraine, chronic, without aura 11/19/2011  . Episodic mood disorder ([place] 07/18/2008  . Lump or mass in breast 06/30/2008  . ANEMIA, IRON DEFICIENCY, HX OF 10/07/2007    Palyn Scrima C. Bernedette Auston PT, DPT 12/12/16 11:42  AM   [facility] [address] [place], [place], [address] Phone: [phone]   Fax:  [phone]  Name: Bethany [name] MRN: [id number] Date of Birth: [birth year]

## 2016-12-16 ENCOUNTER — Ambulatory Visit: Payer: PRIVATE HEALTH INSURANCE | Admitting: Physical Therapy

## 2016-12-16 DIAGNOSIS — M25611 Stiffness of right shoulder, not elsewhere classified: Secondary | ICD-10-CM

## 2016-12-16 DIAGNOSIS — M25511 Pain in right shoulder: Secondary | ICD-10-CM | POA: Diagnosis not present

## 2016-12-16 DIAGNOSIS — G8929 Other chronic pain: Secondary | ICD-10-CM

## 2016-12-16 DIAGNOSIS — M6281 Muscle weakness (generalized): Secondary | ICD-10-CM

## 2016-12-16 NOTE — Therapy (Signed)
[place], [place], [address] Phone: [phone]   Fax:  [phone]  Physical Therapy Treatment  Patient Details  Name: Bethany [name] MRN: [id number] Date of Birth: [birth year] Referring Provider: Everrett Coombe, MD  Encounter Date: 12/16/2016      PT End of Session - 12/16/16 1020    Visit Number 6   Number of Visits 17   Date for PT Re-Evaluation 01/24/17   Authorization Type MEDCOST no visit limit   PT Start Time 1020  pt arrived late   PT Stop Time 1106   PT Time Calculation (min) 46 min   Activity Tolerance Patient tolerated treatment well   Behavior During Therapy [facility] for tasks assessed/performed      Past Medical History:  Diagnosis Date  . Anemia   . Anxiety   . Depression   . History of cervical dysplasia    laser surgery  . Insomnia   . Vaginal Pap smear, abnormal     Past Surgical History:  Procedure Laterality Date  . BREAST FIBROADENOMA SURGERY  2011, 2008   benign   . CESAREAN SECTION  1995  . HYSTEROSCOPY WITH NOVASURE N/A 01/02/2016   Procedure: NOVASURE;  Surgeon: Woodroe Mode, MD;  Location: [facility];  Service: Gynecology;  Laterality: N/A;  . TUBAL LIGATION  2003    There were no vitals filed for this visit.      Subjective Assessment - 12/16/16 1020    Subjective pt reports shoulder is feeling good. Denies soreness after last visit   Patient Stated Goals carry on in overhead bin, fix hair, back to "normal"  reach up and stretch without pain, scoop ice with Right hand   Currently in Pain? No/denies                         [facility] Adult PT Treatment/Exercise - 12/16/16 0001      Shoulder Exercises: Supine   Flexion 10 reps   Shoulder Flexion Weight (lbs) 2   Flexion Limitations weight on wand, end range stretch     Shoulder Exercises: Prone   Retraction 10 reps   Retraction Limitations + extension     Shoulder Exercises: Standing   Other  Standing Exercises 4-way diagonals red tband   Other Standing Exercises fwd punch blue tband     Shoulder Exercises: ROM/Strengthening   Rebounder green plyo ball   UBE (Upper Arm Bike) 4 min L4     Shoulder Exercises: Stretch   Other Shoulder Stretches child pose     Cryotherapy   Number Minutes Cryotherapy 10 Minutes   Cryotherapy Location Shoulder   Type of Cryotherapy Ice pack     Manual Therapy   Joint Mobilization prone GHJ PA, prone & sidelying scap mobs & distraction, supine gr 4 end range flx   Soft tissue mobilization subscap                  PT Short Term Goals - 12/06/16 1354      PT SHORT TERM GOAL #1   Title Pt will be independent with HEP as it has been established.   Baseline will progress as appropriate   Time 4   Period Weeks   Status On-going     PT SHORT TERM GOAL #2   Title pt will demo 10 deg incr in GHJ ROM   Baseline see flowsheet   Time 4   Period Weeks  Status On-going           PT Long Term Goals - 12/12/16 1006      PT LONG TERM GOAL #1   Title fOTO to 32% limitation to indicate significant increase in functional ability   Baseline 52% limited at eval   Status Unable to assess     PT LONG TERM GOAL #2   Title Pt will demo ability lift overhead properly to place carry on in bin on plane, pain <=2/10   Baseline not tested today   Status Unable to assess     PT LONG TERM GOAL #3   Title Pt will be able to return to using R arm for functional daily activities such as carrying her purse and scooping ice   Baseline getting back to it but still favors L   Status On-going     PT LONG TERM GOAL #4   Title Pt will be able to fix her hair without being limited by ROM.    Baseline demo ROM but has not tried to do a french braid   Status Partially Met               Plan - 12/16/16 1053    Clinical Impression Statement end range mobilizations which were uncomfortable but was able to gain significant PROM. Weakness felt  in weighted flexion- difficulty returning from new end range. pt advised she will probably be sore and to ice her shoulder PRN but to keep moving.    PT Treatment/Interventions ADLs/Self Care Home Management;Cryotherapy;Electrical Stimulation;Iontophoresis 18m/ml Dexamethasone;Functional mobility training;Ultrasound;Traction;Moist Heat;Therapeutic activities;Therapeutic exercise;Neuromuscular re-education;Patient/family education;Passive range of motion;Manual techniques;Dry needling;Taping   PT Next Visit Plan GHJ stretching, endurance in end ranges, review POC   PT Home Exercise Plan flexion stretch pull from sink, ER red tband, thoracic ext; child pose, prone retraction, horiz abd & flexion; 4-way GHJ diagonals;    Consulted and Agree with Plan of Care Patient      Patient will benefit from skilled therapeutic intervention in order to improve the following deficits and impairments:  Decreased range of motion, Impaired UE functional use, Increased muscle spasms, Decreased activity tolerance, Pain, Impaired flexibility, Hypomobility, Decreased strength  Visit Diagnosis: Chronic right shoulder pain  Stiffness of right shoulder, not elsewhere classified  Muscle weakness (generalized)     Problem List Patient Active Problem List   Diagnosis Date Noted  . Injury of tendon of right rotator cuff 11/15/2016  . Nausea with vomiting 05/10/2016  . Left flank pain 12/21/2014  . Mood disorder ([place] 08/31/2014  . Menorrhagia 05/09/2014  . Preventative health care 05/09/2014  . Left breast mass 08/03/2013  . Migraine, chronic, without aura 11/19/2011  . Episodic mood disorder ([place] 07/18/2008  . Lump or mass in breast 06/30/2008  . ANEMIA, IRON DEFICIENCY, HX OF 10/07/2007   Bethany [name] PT, DPT 12/16/16 10:57 AM   [facility] [address]Phone: [phone]  Fax:  [phone] Name: Bethany ScrimaMRN: [id number] of Birth: [birth year]

## 2016-12-19 ENCOUNTER — Encounter: Payer: Self-pay | Admitting: Physical Therapy

## 2016-12-19 ENCOUNTER — Ambulatory Visit: Payer: PRIVATE HEALTH INSURANCE | Admitting: Physical Therapy

## 2016-12-19 DIAGNOSIS — M25511 Pain in right shoulder: Secondary | ICD-10-CM | POA: Diagnosis not present

## 2016-12-19 DIAGNOSIS — G8929 Other chronic pain: Secondary | ICD-10-CM

## 2016-12-19 DIAGNOSIS — M25611 Stiffness of right shoulder, not elsewhere classified: Secondary | ICD-10-CM

## 2016-12-19 DIAGNOSIS — M6281 Muscle weakness (generalized): Secondary | ICD-10-CM

## 2016-12-19 NOTE — Therapy (Signed)
[place], [place], [address] Phone: [phone]   Fax:  [phone]  Physical Therapy Treatment/Discharge Summary  Patient Details  Name: Bethany [name] MRN: [id number] Date of Birth: 1975-09-23 Referring Provider: Everrett Coombe, MD  Encounter Date: 12/19/2016      PT End of Session - 12/19/16 0925    Visit Number 7   Number of Visits 17   Date for PT Re-Evaluation 01/24/17   Authorization Type MEDCOST no visit limit   PT Start Time 0925   PT Stop Time 0958   PT Time Calculation (min) 33 min   Activity Tolerance Patient tolerated treatment well   Behavior During Therapy [facility] for tasks assessed/performed      Past Medical History:  Diagnosis Date  . Anemia   . Anxiety   . Depression   . History of cervical dysplasia    laser surgery  . Insomnia   . Vaginal Pap smear, abnormal     Past Surgical History:  Procedure Laterality Date  . BREAST FIBROADENOMA SURGERY  2011, 2008   benign   . CESAREAN SECTION  1995  . HYSTEROSCOPY WITH NOVASURE N/A 01/02/2016   Procedure: NOVASURE;  Surgeon: Woodroe Mode, MD;  Location: [facility];  Service: Gynecology;  Laterality: N/A;  . TUBAL LIGATION  2003    There were no vitals filed for this visit.      Subjective Assessment - 12/19/16 0925    Subjective denies pain or limitation in shoulder.   Patient Stated Goals carry on in overhead bin, fix hair, back to "normal"  reach up and stretch without pain, scoop ice with Right hand   Currently in Pain? No/denies            [facility] PT Assessment - 12/19/16 0001      Assessment   Medical Diagnosis R rotator cuff injury   Referring Provider Everrett Coombe, MD   Onset Date/Surgical Date --  1 year ago     Observation/Other Assessments   Focus on Therapeutic Outcomes (FOTO)  34% limited     Sensation   Additional Comments WFL     AROM   Right Shoulder Flexion 130 Degrees   Right Shoulder ABduction 145  Degrees     Strength   Right Shoulder Flexion 4+/5   Right Shoulder ABduction 5/5   Right Shoulder Internal Rotation 5/5   Right Shoulder External Rotation 5/5     Palpation   Palpation comment denies TTP                     [facility] Adult PT Treatment/Exercise - 12/19/16 0001      Shoulder Exercises: Supine   Flexion Limitations with horiz abd pull on green band     Shoulder Exercises: Seated   External Rotation 15 reps   Theraband Level (Shoulder External Rotation) Level 3 (Green)     Shoulder Exercises: Prone   Retraction Limitations prone scapular retraction   Horizontal ABduction 1 Weight (lbs) 2   Horizontal ABduction 1 Limitations Y & T   Other Prone Exercises row to triceps kick     Shoulder Exercises: Standing   Other Standing Exercises 4-way diagonals red tband     Shoulder Exercises: ROM/Strengthening   UBE (Upper Arm Bike) retro 3 min L3     Shoulder Exercises: Stretch   Other Shoulder Stretches child pose  PT Education - 12/19/16 1000    Education provided Yes   Education Details review of HEP, goals discussion   Person(s) Educated Patient   Methods Explanation;Demonstration;Tactile cues;Verbal cues   Comprehension Verbalized understanding;Returned demonstration;Verbal cues required;Tactile cues required          PT Short Term Goals - 12/19/16 0932      PT SHORT TERM GOAL #1   Title Pt will be independent with HEP as it has been established.   Status Achieved     PT SHORT TERM GOAL #2   Title pt will demo 10 deg incr in GHJ ROM   Status Achieved           PT Long Term Goals - 12/19/16 0932      PT LONG TERM GOAL #1   Title fOTO to 32% limitation to indicate significant increase in functional ability   Baseline 34% limitation (52% at eval)   Status Partially Met     PT LONG TERM GOAL #2   Title Pt will demo ability lift overhead properly to place carry on in bin on plane, pain <=2/10   Baseline able  without pain   Status Achieved     PT LONG TERM GOAL #3   Title Pt will be able to return to using R arm for functional daily activities such as carrying her purse and scooping ice   Baseline able   Status Achieved     PT LONG TERM GOAL #4   Title Pt will be able to fix her hair without being limited by ROM.    Baseline able   Status Achieved               Plan - 12/19/16 1001    Clinical Impression Statement Pt has made significant progress toward goals since beginning PT. pt denies any limitaitons or pain in daily activities. Educted on importance of continued exercises and was encouraged to contact us with any further questions.    PT Treatment/Interventions ADLs/Self Care Home Management;Cryotherapy;Electrical Stimulation;Iontophoresis 42m/ml Dexamethasone;Functional mobility training;Ultrasound;Traction;Moist Heat;Therapeutic activities;Therapeutic exercise;Neuromuscular re-education;Patient/family education;Passive range of motion;Manual techniques;Dry needling;Taping   Consulted and Agree with Plan of Care Patient      Patient will benefit from skilled therapeutic intervention in order to improve the following deficits and impairments:  Decreased range of motion, Impaired UE functional use, Increased muscle spasms, Decreased activity tolerance, Pain, Impaired flexibility, Hypomobility, Decreased strength  Visit Diagnosis: Chronic right shoulder pain  Stiffness of right shoulder, not elsewhere classified  Muscle weakness (generalized)     Problem List Patient Active Problem List   Diagnosis Date Noted  . Injury of tendon of right rotator cuff 11/15/2016  . Nausea with vomiting 05/10/2016  . Left flank pain 12/21/2014  . Mood disorder ([place] 08/31/2014  . Menorrhagia 05/09/2014  . Preventative health care 05/09/2014  . Left breast mass 08/03/2013  . Migraine, chronic, without aura 11/19/2011  . Episodic mood disorder ([place] 07/18/2008  . Lump or mass in breast  06/30/2008  . ANEMIA, IRON DEFICIENCY, HX OF 10/07/2007   PHYSICAL THERAPY DISCHARGE SUMMARY  Visits from Start of Care: 7  Current functional level related to goals / functional outcomes: See above   Remaining deficits: See above   Education / Equipment: Anatomy of condition, POC, HEP, exercise form/rationale  Plan: Patient agrees to discharge.  Patient goals were met. Patient is being discharged due to meeting the stated rehab goals.  ?????     Tamico Mundo C. Darick Fetters PT, DPT  12/19/16 10:04 AM   [facility] [place], [place], [address] Phone: [phone]   Fax:  [phone]  Name: Bethany [name] MRN: [id number] Date of Birth: 01/20/1976

## 2016-12-24 ENCOUNTER — Ambulatory Visit: Payer: PRIVATE HEALTH INSURANCE | Admitting: Physical Therapy

## 2016-12-26 ENCOUNTER — Encounter: Payer: Self-pay | Admitting: Physical Therapy

## 2017-01-14 ENCOUNTER — Encounter (HOSPITAL_COMMUNITY): Payer: Self-pay | Admitting: Psychiatry

## 2017-01-14 ENCOUNTER — Ambulatory Visit (INDEPENDENT_AMBULATORY_CARE_PROVIDER_SITE_OTHER): Payer: PRIVATE HEALTH INSURANCE | Admitting: Psychiatry

## 2017-01-14 DIAGNOSIS — F3160 Bipolar disorder, current episode mixed, unspecified: Secondary | ICD-10-CM | POA: Diagnosis not present

## 2017-01-14 DIAGNOSIS — F419 Anxiety disorder, unspecified: Secondary | ICD-10-CM | POA: Diagnosis not present

## 2017-01-14 DIAGNOSIS — R634 Abnormal weight loss: Secondary | ICD-10-CM

## 2017-01-14 DIAGNOSIS — Z811 Family history of alcohol abuse and dependence: Secondary | ICD-10-CM

## 2017-01-14 DIAGNOSIS — Z818 Family history of other mental and behavioral disorders: Secondary | ICD-10-CM | POA: Diagnosis not present

## 2017-01-14 DIAGNOSIS — R450 Nervousness: Secondary | ICD-10-CM

## 2017-01-14 DIAGNOSIS — G4700 Insomnia, unspecified: Secondary | ICD-10-CM | POA: Diagnosis not present

## 2017-01-14 MED ORDER — TRAZODONE HCL 50 MG PO TABS: 50.0000 mg | ORAL_TABLET | Freq: Every day | ORAL | 0 refills | Status: DC

## 2017-01-14 MED ORDER — HYDROXYZINE PAMOATE 25 MG PO CAPS: ORAL_CAPSULE | ORAL | 0 refills | Status: DC

## 2017-01-14 MED ORDER — ARIPIPRAZOLE 10 MG PO TABS: 10.0000 mg | ORAL_TABLET | Freq: Every day | ORAL | 0 refills | Status: DC
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
## 2017-02-12 ENCOUNTER — Encounter (HOSPITAL_COMMUNITY): Payer: Self-pay | Admitting: Psychiatry

## 2017-02-12 ENCOUNTER — Ambulatory Visit (INDEPENDENT_AMBULATORY_CARE_PROVIDER_SITE_OTHER): Payer: PRIVATE HEALTH INSURANCE | Admitting: Psychiatry

## 2017-02-12 DIAGNOSIS — F3160 Bipolar disorder, current episode mixed, unspecified: Secondary | ICD-10-CM | POA: Diagnosis not present

## 2017-02-12 DIAGNOSIS — Z7289 Other problems related to lifestyle: Secondary | ICD-10-CM

## 2017-02-12 DIAGNOSIS — Z818 Family history of other mental and behavioral disorders: Secondary | ICD-10-CM | POA: Diagnosis not present

## 2017-02-12 DIAGNOSIS — Z79899 Other long term (current) drug therapy: Secondary | ICD-10-CM

## 2017-02-12 MED ORDER — HYDROXYZINE PAMOATE 25 MG PO CAPS: ORAL_CAPSULE | ORAL | 1 refills | Status: DC

## 2017-02-12 MED ORDER — ARIPIPRAZOLE 15 MG PO TABS: 15.0000 mg | ORAL_TABLET | Freq: Every day | ORAL | 1 refills | Status: DC
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
## 2017-03-08 ENCOUNTER — Other Ambulatory Visit (HOSPITAL_COMMUNITY): Payer: Self-pay | Admitting: Psychiatry

## 2017-03-08 DIAGNOSIS — F3160 Bipolar disorder, current episode mixed, unspecified: Secondary | ICD-10-CM

## 2017-04-08 ENCOUNTER — Other Ambulatory Visit (HOSPITAL_COMMUNITY): Payer: Self-pay | Admitting: Psychiatry

## 2017-04-08 DIAGNOSIS — F3160 Bipolar disorder, current episode mixed, unspecified: Secondary | ICD-10-CM

## 2017-04-11 ENCOUNTER — Encounter (HOSPITAL_COMMUNITY): Payer: Self-pay

## 2017-04-11 ENCOUNTER — Ambulatory Visit (INDEPENDENT_AMBULATORY_CARE_PROVIDER_SITE_OTHER): Payer: PRIVATE HEALTH INSURANCE | Admitting: Psychiatry

## 2017-04-11 ENCOUNTER — Encounter (HOSPITAL_COMMUNITY): Payer: Self-pay | Admitting: Psychiatry

## 2017-04-11 DIAGNOSIS — Z811 Family history of alcohol abuse and dependence: Secondary | ICD-10-CM

## 2017-04-11 DIAGNOSIS — F419 Anxiety disorder, unspecified: Secondary | ICD-10-CM

## 2017-04-11 DIAGNOSIS — Z79899 Other long term (current) drug therapy: Secondary | ICD-10-CM

## 2017-04-11 DIAGNOSIS — Z813 Family history of other psychoactive substance abuse and dependence: Secondary | ICD-10-CM | POA: Diagnosis not present

## 2017-04-11 DIAGNOSIS — Z818 Family history of other mental and behavioral disorders: Secondary | ICD-10-CM | POA: Diagnosis not present

## 2017-04-11 DIAGNOSIS — F3160 Bipolar disorder, current episode mixed, unspecified: Secondary | ICD-10-CM | POA: Diagnosis not present

## 2017-04-11 DIAGNOSIS — Z6379 Other stressful life events affecting family and household: Secondary | ICD-10-CM | POA: Diagnosis not present

## 2017-04-11 MED ORDER — ARIPIPRAZOLE 20 MG PO TABS: 20.0000 mg | ORAL_TABLET | Freq: Every day | ORAL | 1 refills | Status: DC

## 2017-04-11 MED ORDER — HYDROXYZINE PAMOATE 25 MG PO CAPS: ORAL_CAPSULE | ORAL | 1 refills | Status: DC
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
## 2017-04-22 HISTORY — PX: MASTECTOMY: SHX3

## 2017-06-11 ENCOUNTER — Other Ambulatory Visit (HOSPITAL_COMMUNITY): Payer: Self-pay | Admitting: Psychiatry

## 2017-06-11 DIAGNOSIS — F3160 Bipolar disorder, current episode mixed, unspecified: Secondary | ICD-10-CM

## 2017-06-12 ENCOUNTER — Ambulatory Visit (INDEPENDENT_AMBULATORY_CARE_PROVIDER_SITE_OTHER): Payer: PRIVATE HEALTH INSURANCE | Admitting: Psychiatry

## 2017-06-12 ENCOUNTER — Encounter (HOSPITAL_COMMUNITY): Payer: Self-pay | Admitting: Psychiatry

## 2017-06-12 DIAGNOSIS — Z813 Family history of other psychoactive substance abuse and dependence: Secondary | ICD-10-CM

## 2017-06-12 DIAGNOSIS — Z811 Family history of alcohol abuse and dependence: Secondary | ICD-10-CM | POA: Diagnosis not present

## 2017-06-12 DIAGNOSIS — F419 Anxiety disorder, unspecified: Secondary | ICD-10-CM

## 2017-06-12 DIAGNOSIS — Z818 Family history of other mental and behavioral disorders: Secondary | ICD-10-CM

## 2017-06-12 DIAGNOSIS — F3160 Bipolar disorder, current episode mixed, unspecified: Secondary | ICD-10-CM

## 2017-06-12 MED ORDER — ARIPIPRAZOLE 20 MG PO TABS: 20.0000 mg | ORAL_TABLET | Freq: Every day | ORAL | 2 refills | Status: DC

## 2017-06-12 MED ORDER — HYDROXYZINE PAMOATE 25 MG PO CAPS: ORAL_CAPSULE | ORAL | 2 refills | Status: DC

## 2017-06-12 NOTE — Progress Notes (Signed)
BH MD/PA/NP OP Progress Note  06/12/2017 8:43 AM Bethany [name]  MRN:  [id number]  Chief Complaint: I am doing much better with increase Abilify.  I am taking it very regularly.  I see a huge difference.  HPI: Bethany [name] came for her follow-up appointment.  On her last visit we increase Abilify to 20 mg and she is seeing much improvement in her mood irritability and depression.  She is no longer taking trazodone.  She is less anxious and less depressed.  Her husband is now on probation for 2 years and she is relieved that she does not have to go to the court anymore.  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  She had a good Christmas.  She really felt much better with increase Abilify.  She has no rash, itching, tremors or shakes.  Her sleep is good.  She denies any paranoia or any hallucination.  Her energy level is good.  She is happy that she gained weight from the past because she is eating better.  Patient denies drinking alcohol or using any illegal substances.  She lives with her 2 daughter and her husband.  There has been no new issues from the husband and she is pleased that husband is very cooperative and supportive.  Visit Diagnosis:    ICD-10-CM   1. 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 Patient started seeing a therapist when she was in [facility] after her brother killed. She was given Zoloft and lorazepam by her primary care physician. Patient denies any history of psychiatric inpatient treatment, suicidal attempt, psychosis or any mania. She had history of irritability, anger and mood swing. She had tried Lamictal but cause rash and it was stopped.  Past Medical History:  Past Medical History:  Diagnosis Date  . Anemia   . Anxiety   . Depression   . History of cervical dysplasia    laser surgery  . Insomnia   . Vaginal Pap smear,  abnormal     Past Surgical History:  Procedure Laterality Date  . BREAST FIBROADENOMA SURGERY  2011, 2008   benign   . CESAREAN SECTION  1995  . HYSTEROSCOPY WITH NOVASURE N/A 01/02/2016   Procedure: NOVASURE;  Surgeon: Woodroe Mode, MD;  Location: [facility];  Service: Gynecology;  Laterality: N/A;  . TUBAL LIGATION  2003    Family Psychiatric History: Reviewed.  Family History:  Family History  Problem Relation Age of Onset  . Anxiety disorder Father   . Depression Father   . Diabetes Maternal Grandmother   . Hypertension Paternal Grandfather   . Cancer Maternal Aunt        breast  . Breast cancer Maternal Aunt   . Alcohol abuse Maternal Uncle   . Alcohol abuse Cousin   . Drug abuse Cousin   . Stroke Neg Hx     Social History:  Social History   Socioeconomic History  . Marital status: Married    Spouse name: None  . Number of children: None  . Years of education: None  . Highest education level: None  Social Needs  . Financial resource strain: None  . Food insecurity - worry: None  . Food insecurity - inability: None  . Transportation needs - medical: None  . Transportation needs - non-medical: None  Occupational History  . None  Tobacco Use  .  Smoking status: Never Smoker  . Smokeless tobacco: Never Used  Substance and Sexual Activity  . Alcohol use: No    Alcohol/week: 0.0 oz  . Drug use: No  . Sexual activity: Yes    Partners: Male    Birth control/protection: Surgical  Other Topics Concern  . None  Social History Narrative   Married, 3 daughters.  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          Allergies:  Allergies  Allergen Reactions  . Lamictal [Lamotrigine] Rash  . Codeine Hives  . Oxycodone Hcl Hives    Metabolic Disorder Labs: Lab Results  Component Value Date   HGBA1C 5.4 09/19/2010   No results found for: PROLACTIN Lab Results  Component Value Date   CHOL 187 10/01/2011   TRIG 64 10/01/2011   HDL 68  10/01/2011   CHOLHDL 2.8 10/01/2011   VLDL 13 10/01/2011   LDLCALC 106 (H) 10/01/2011   LDLCALC 109 (H) 10/07/2007   Lab Results  Component Value Date   TSH 0.492 05/09/2014   TSH 0.394 05/07/2013    Therapeutic Level Labs: No results found for: LITHIUM No results found for: VALPROATE No components found for:  CBMZ  Current Medications: Current Outpatient Medications  Medication Sig Dispense Refill  . acetaminophen (TYLENOL) 500 MG tablet Take 1,000 mg by mouth every 6 (six) hours as needed for moderate pain.    . ARIPiprazole (ABILIFY) 20 MG tablet Take 1 tablet (20 mg total) by mouth daily. 30 tablet 1  . hydrOXYzine (VISTARIL) 25 MG capsule TAKE 1-2 CAPSULE (25 MG TOTAL) BY MOUTH AT BEDTIME AS NEEDED FOR ANXIETY. 60 capsule 1  . ibuprofen (ADVIL,MOTRIN) 800 MG tablet Take 1 tablet (800 mg total) by mouth every 8 (eight) hours as needed for moderate pain. 30 tablet 1   No current facility-administered medications for this visit.      Musculoskeletal: Strength & Muscle Tone: within normal limits Gait & Station: normal Patient leans: N/A  Psychiatric Specialty Exam: ROS  Blood pressure 98/66, pulse 82, height 5\' 4"  (1.626 m), weight 129 lb 9.6 oz (58.8 kg).Body mass index is 22.25 kg/m.  General Appearance: Casual  Eye Contact:  Good  Speech:  Clear and Coherent  Volume:  Normal  Mood:  Euthymic  Affect:  Appropriate  Thought Process:  Goal Directed  Orientation:  Full (Time, Place, and Person)  Thought Content: Logical   Suicidal Thoughts:  No  Homicidal Thoughts:  No  Memory:  Immediate;   Good Recent;   Good Remote;   Good  Judgement:  Good  Insight:  Good  Psychomotor Activity:  Normal  Concentration:  Concentration: Good and Attention Span: Good  Recall:  Good  Fund of Knowledge: Good  Language: Good  Akathisia:  No  Handed:  Right  AIMS (if indicated): not done  Assets:  Communication Skills Desire for [place] Talents/Skills Transportation  ADL's:  Intact  Cognition: WNL  Sleep:  Good   Screenings: GAD-7     Office Visit from 12/21/2015 in [place] for [place]  Total GAD-7 Score  4    PHQ2-9     Office Visit from 11/15/2016 in [place] Office Visit from 09/03/2016 in [place] Office Visit from 05/10/2016 in [place] Office Visit from 12/21/2015 in [place] for [place] Office Visit from 12/20/2014 in [place]  PHQ-2  Total Score  0  0  0  0  1  PHQ-9 Total Score  No data  No data  No data  3  No data       Assessment and Plan: Bipolar disorder type I.  Anxiety disorder NOS.  Patient doing better since we increased Abilify.  She is tolerating the dose very well.  She has no side effects.  Continue Abilify 20 mg daily and Vistaril 25 mg twice a day to help with anxiety.  Patient is not interested in counseling.  Discussed medication side effects and benefits.  Recommended to call us back if she has any question or any concern.  Patient scheduled to see her primary care physician in April.  Follow-up in 3 months.   [name], MD 06/12/2017, 8:43 AM

## 2017-07-11 ENCOUNTER — Ambulatory Visit: Payer: PRIVATE HEALTH INSURANCE

## 2017-08-04 ENCOUNTER — Other Ambulatory Visit: Payer: Self-pay | Admitting: Family Medicine

## 2017-08-04 DIAGNOSIS — Z1231 Encounter for screening mammogram for malignant neoplasm of breast: Secondary | ICD-10-CM

## 2017-09-06 ENCOUNTER — Other Ambulatory Visit (HOSPITAL_COMMUNITY): Payer: Self-pay | Admitting: Psychiatry

## 2017-09-06 DIAGNOSIS — F3160 Bipolar disorder, current episode mixed, unspecified: Secondary | ICD-10-CM

## 2017-09-09 ENCOUNTER — Ambulatory Visit (INDEPENDENT_AMBULATORY_CARE_PROVIDER_SITE_OTHER): Payer: PRIVATE HEALTH INSURANCE | Admitting: Psychiatry

## 2017-09-09 ENCOUNTER — Encounter (HOSPITAL_COMMUNITY): Payer: Self-pay | Admitting: Psychiatry

## 2017-09-09 DIAGNOSIS — F3160 Bipolar disorder, current episode mixed, unspecified: Secondary | ICD-10-CM | POA: Diagnosis not present

## 2017-09-09 DIAGNOSIS — Z811 Family history of alcohol abuse and dependence: Secondary | ICD-10-CM

## 2017-09-09 DIAGNOSIS — Z818 Family history of other mental and behavioral disorders: Secondary | ICD-10-CM

## 2017-09-09 DIAGNOSIS — Z813 Family history of other psychoactive substance abuse and dependence: Secondary | ICD-10-CM | POA: Diagnosis not present

## 2017-09-09 DIAGNOSIS — F419 Anxiety disorder, unspecified: Secondary | ICD-10-CM

## 2017-09-09 MED ORDER — HYDROXYZINE PAMOATE 25 MG PO CAPS: ORAL_CAPSULE | ORAL | 2 refills | Status: DC

## 2017-09-09 MED ORDER — ARIPIPRAZOLE 20 MG PO TABS: 20.0000 mg | ORAL_TABLET | Freq: Every day | ORAL | 2 refills | Status: DC

## 2017-09-09 NOTE — Progress Notes (Signed)
BH MD/PA/NP OP Progress Note  09/09/2017 10:34 AM Bethany [name]  MRN:  [id number]  Chief Complaint: I am doing good.  I am taking my medication.  HPI: Bethany [name] came for her follow-up appointment.  She is compliant with medication.  She is doing much better since she is compliant with Abilify.  She takes Vistaril 25 mg twice a day which is helping her anxiety.  She has been busy working 2 jobs but she likes it because it keep herself busy.  She is happy that her husband is doing much better and lately there has been no issue.  Her husband currently on probation for 2 years.  Patient has no tremors, shakes or any EPS.  Her sleep is good.  Her energy level is good.  She is happy that she gained weight from the past because she is eating better.  Patient denies drinking or using any illegal substances.  She lives with her daughter and her husband.  Visit Diagnosis:    ICD-10-CM   1. 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 Patient started seeing a therapist when she was in [facility] after her brother killed. She was given Zoloft and lorazepam by her primary care physician. Patient denies any history of psychiatric inpatient treatment, suicidal attempt, psychosis or any mania. She had history of irritability, anger and mood swing. She had tried Lamictal but cause rash and it was stopped.  Past Medical History:  Past Medical History:  Diagnosis Date  . Anemia   . Anxiety   . Depression   . History of cervical dysplasia    laser surgery  . Insomnia   . Vaginal Pap smear, abnormal     Past Surgical History:  Procedure Laterality Date  . BREAST FIBROADENOMA SURGERY  2011, 2008   benign   . CESAREAN SECTION  1995  . HYSTEROSCOPY WITH NOVASURE N/A 01/02/2016   Procedure: NOVASURE;  Surgeon: Woodroe Mode, MD;  Location: [facility];  Service: Gynecology;  Laterality: N/A;  . TUBAL  LIGATION  2003    Family Psychiatric History: Reviewed.  Family History:  Family History  Problem Relation Age of Onset  . Anxiety disorder Father   . Depression Father   . Diabetes Maternal Grandmother   . Hypertension Paternal Grandfather   . Cancer Maternal Aunt        breast  . Breast cancer Maternal Aunt   . Alcohol abuse Maternal Uncle   . Alcohol abuse Cousin   . Drug abuse Cousin   . Stroke Neg Hx     Social History:  Social History   Socioeconomic History  . Marital status: Married    Spouse name: Not on file  . Number of children: Not on file  . Years of education: Not on file  . Highest education level: Not on file  Occupational History  . Not on file  Social Needs  . Financial resource strain: Not on file  . Food insecurity:    Worry: Not on file    Inability: Not on file  . Transportation needs:    Medical: Not on file    Non-medical: Not on file  Tobacco Use  . Smoking status: Never Smoker  . Smokeless tobacco: Never Used  Substance and Sexual Activity  . Alcohol use: No    Alcohol/week: 0.0 oz  . Drug use: No  . Sexual activity:  Yes    Partners: Male    Birth control/protection: Surgical  Lifestyle  . Physical activity:    Days per week: Not on file    Minutes per session: Not on file  . Stress: Not on file  Relationships  . Social connections:    Talks on phone: Not on file    Gets together: Not on file    Attends religious service: Not on file    Active member of club or organization: Not on file    Attends meetings of clubs or organizations: Not on file    Relationship status: Not on file  Other Topics Concern  . Not on file  Social History Narrative   Married, 3 daughters.  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          Allergies:  Allergies  Allergen Reactions  . Lamictal [Lamotrigine] Rash  . Codeine Hives  . Oxycodone Hcl Hives    Metabolic Disorder Labs: Lab Results  Component Value Date    HGBA1C 5.4 09/19/2010   No results found for: PROLACTIN Lab Results  Component Value Date   CHOL 187 10/01/2011   TRIG 64 10/01/2011   HDL 68 10/01/2011   CHOLHDL 2.8 10/01/2011   VLDL 13 10/01/2011   LDLCALC 106 (H) 10/01/2011   LDLCALC 109 (H) 10/07/2007   Lab Results  Component Value Date   TSH 0.492 05/09/2014   TSH 0.394 05/07/2013    Therapeutic Level Labs: No results found for: LITHIUM No results found for: VALPROATE No components found for:  CBMZ  Current Medications: Current Outpatient Medications  Medication Sig Dispense Refill  . acetaminophen (TYLENOL) 500 MG tablet Take 1,000 mg by mouth every 6 (six) hours as needed for moderate pain.    . ARIPiprazole (ABILIFY) 20 MG tablet Take 1 tablet (20 mg total) by mouth daily. 30 tablet 2  . hydrOXYzine (VISTARIL) 25 MG capsule TAKE 1-2 CAPSULE (25 MG TOTAL) BY MOUTH AT BEDTIME AS NEEDED FOR ANXIETY. 60 capsule 2  . ibuprofen (ADVIL,MOTRIN) 800 MG tablet Take 1 tablet (800 mg total) by mouth every 8 (eight) hours as needed for moderate pain. 30 tablet 1   No current facility-administered medications for this visit.      Musculoskeletal: Strength & Muscle Tone: within normal limits Gait & Station: normal Patient leans: N/A  Psychiatric Specialty Exam: ROS  Blood pressure 101/72, pulse 74, height 5\' 4"  (1.626 m), weight 135 lb 6.4 oz (61.4 kg), SpO2 100 %.Body mass index is 23.24 kg/m.  General Appearance: Well Groomed  Eye Contact:  Good  Speech:  Clear and Coherent  Volume:  Normal  Mood:  Euthymic  Affect:  Congruent  Thought Process:  Goal Directed  Orientation:  Full (Time, Place, and Person)  Thought Content: Logical   Suicidal Thoughts:  No  Homicidal Thoughts:  No  Memory:  Immediate;   Good Recent;   Good Remote;   Good  Judgement:  Good  Insight:  Present  Psychomotor Activity:  Normal  Concentration:  Concentration: Good and Attention Span: Good  Recall:  Good  Fund of Knowledge: Good   Language: Good  Akathisia:  No  Handed:  Right  AIMS (if indicated): not done  Assets:  Communication Skills Desire for [place] Talents/Skills Transportation  ADL's:  Intact  Cognition: WNL  Sleep:  Good   Screenings: GAD-7     Office Visit from 12/21/2015 in [place] for [place]  Total GAD-7 Score  4    PHQ2-9     Office Visit from 11/15/2016 in [place] Office Visit from 09/03/2016 in [place] Office Visit from 05/10/2016 in [place] Office Visit from 12/21/2015 in [place] for [place] Visit from 12/20/2014 in [place]  PHQ-2 Total Score  0  0  0  0  1  PHQ-9 Total Score  -  -  -  3  -       Assessment and Plan: Bipolar disorder type I.  Anxiety disorder NOS.  Patient doing better on her current medication.  She is not interested in counseling.  Continue Abilify 20 mg daily and Vistaril 25 mg twice a day for anxiety.  Discussed medication side effects and benefits.  Recommended to call us back if she has any question or any concern.  Follow-up in 3 months.   [name], MD 09/09/2017, 10:34 AM

## 2017-09-11 ENCOUNTER — Ambulatory Visit: Payer: Self-pay

## 2017-10-02 ENCOUNTER — Ambulatory Visit: Payer: Self-pay

## 2017-10-20 ENCOUNTER — Ambulatory Visit
Admission: RE | Admit: 2017-10-20 | Discharge: 2017-10-20 | Disposition: A | Discharge status: Home / Self Care | Payer: PRIVATE HEALTH INSURANCE | Source: Ambulatory Visit | Attending: Family Medicine | Admitting: Family Medicine

## 2017-10-20 ENCOUNTER — Ambulatory Visit: Payer: Self-pay

## 2017-10-20 DIAGNOSIS — Z1231 Encounter for screening mammogram for malignant neoplasm of breast: Secondary | ICD-10-CM

## 2017-12-16 ENCOUNTER — Encounter (HOSPITAL_COMMUNITY): Payer: Self-pay | Admitting: Psychiatry

## 2017-12-16 ENCOUNTER — Ambulatory Visit (INDEPENDENT_AMBULATORY_CARE_PROVIDER_SITE_OTHER): Payer: PRIVATE HEALTH INSURANCE | Admitting: Psychiatry

## 2017-12-16 DIAGNOSIS — Z813 Family history of other psychoactive substance abuse and dependence: Secondary | ICD-10-CM | POA: Diagnosis not present

## 2017-12-16 DIAGNOSIS — F3160 Bipolar disorder, current episode mixed, unspecified: Secondary | ICD-10-CM | POA: Diagnosis not present

## 2017-12-16 DIAGNOSIS — Z811 Family history of alcohol abuse and dependence: Secondary | ICD-10-CM | POA: Diagnosis not present

## 2017-12-16 DIAGNOSIS — Z818 Family history of other mental and behavioral disorders: Secondary | ICD-10-CM

## 2017-12-16 MED ORDER — ARIPIPRAZOLE 20 MG PO TABS: 20.0000 mg | ORAL_TABLET | Freq: Every day | ORAL | 2 refills | Status: DC

## 2017-12-16 MED ORDER — HYDROXYZINE PAMOATE 25 MG PO CAPS: ORAL_CAPSULE | ORAL | 2 refills | Status: DC

## 2017-12-16 NOTE — Progress Notes (Signed)
BH MD/PA/NP OP Progress Note  12/16/2017 12:12 PM Bethany [name]  MRN:  [id number]  Chief Complaint: Patient returns for medication management appointment HPI: She is an established patient under the care of Dr. Adele Schilder, who is currently out and who I am covering for /she understands she will continue to follow with Dr. Adele Schilder . 42 year old married female, employed. She has been diagnosed with bipolar disorder and describes history of depressive episodes and other episodes of increased racing thoughts and tendency to spend excessively. At this time reports she is doing well.  She is functioning well in her daily activities, including at home and at work.  States she is happy that she changed her job from second shift to first shift which has simplified her daily routine and help normalize her sleep-wake cycle. She denies medication side effects at this time and feels that they have been helpful/denies side effects. Currently does not endorse symptoms of depression and presents euthymic.  Similarly, denies current symptoms of mania and does not present with pressured speech or racing thoughts/ideations or psychomotor restlessness.  Visit Diagnosis:    ICD-10-CM   1. 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 Anemia   . Anxiety   . Depression   . History of cervical dysplasia    laser surgery  . Insomnia   . Vaginal Pap smear, abnormal     Past Surgical History:  Procedure Laterality Date  . BREAST FIBROADENOMA SURGERY  2011, 2008   benign   . CESAREAN SECTION  1995  . HYSTEROSCOPY WITH NOVASURE N/A 01/02/2016   Procedure: NOVASURE;  Surgeon: Woodroe Mode, MD;  Location: [facility];  Service: Gynecology;  Laterality: N/A;  . TUBAL LIGATION  2003    Family Psychiatric History:   Family History:  Family History   Problem Relation Age of Onset  . Anxiety disorder Father   . Depression Father   . Diabetes Maternal Grandmother   . Hypertension Paternal Grandfather   . Cancer Maternal Aunt        breast  . Breast cancer Maternal Aunt   . Alcohol abuse Maternal Uncle   . Alcohol abuse Cousin   . Drug abuse Cousin   . Stroke Neg Hx     Social History:  Social History   Socioeconomic History  . Marital status: Married    Spouse name: Not on file  . Number of children: Not on file  . Years of education: Not on file  . Highest education level: Not on file  Occupational History  . Not on file  Social Needs  . Financial resource strain: Not on file  . Food insecurity:    Worry: Not on file    Inability: Not on file  . Transportation needs:    Medical: Not on file    Non-medical: Not on file  Tobacco Use  . Smoking status: Never Smoker  . Smokeless tobacco: Never Used  Substance and Sexual Activity  . Alcohol use: No    Alcohol/week: 0.0 standard drinks  . Drug use: No  . Sexual activity: Yes    Partners: Male    Birth control/protection: Surgical  Lifestyle  . Physical activity:    Days per week: Not on file    Minutes per session: Not on file  . Stress: Not on  file  Relationships  . Social connections:    Talks on phone: Not on file    Gets together: Not on file    Attends religious service: Not on file    Active member of club or organization: Not on file    Attends meetings of clubs or organizations: Not on file    Relationship status: Not on file  Other Topics Concern  . Not on file  Social History Narrative   Married, 3 daughters.  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          Allergies:  Allergies  Allergen Reactions  . Lamictal [Lamotrigine] Rash  . Codeine Hives  . Oxycodone Hcl Hives    Metabolic Disorder Labs: Lab Results  Component Value Date   HGBA1C 5.4 09/19/2010   No results found for: PROLACTIN Lab Results  Component  Value Date   CHOL 187 10/01/2011   TRIG 64 10/01/2011   HDL 68 10/01/2011   CHOLHDL 2.8 10/01/2011   VLDL 13 10/01/2011   LDLCALC 106 (H) 10/01/2011   LDLCALC 109 (H) 10/07/2007   Lab Results  Component Value Date   TSH 0.492 05/09/2014   TSH 0.394 05/07/2013    Therapeutic Level Labs: No results found for: LITHIUM No results found for: VALPROATE No components found for:  CBMZ  Current Medications: Current Outpatient Medications  Medication Sig Dispense Refill  . acetaminophen (TYLENOL) 500 MG tablet Take 1,000 mg by mouth every 6 (six) hours as needed for moderate pain.    . ARIPiprazole (ABILIFY) 20 MG tablet Take 1 tablet (20 mg total) by mouth daily. 30 tablet 2  . hydrOXYzine (VISTARIL) 25 MG capsule TAKE 1-2 CAPSULE (25 MG TOTAL) BY MOUTH AT BEDTIME AS NEEDED FOR ANXIETY. 60 capsule 2  . ibuprofen (ADVIL,MOTRIN) 800 MG tablet Take 1 tablet (800 mg total) by mouth every 8 (eight) hours as needed for moderate pain. 30 tablet 1   No current facility-administered medications for this visit.      Musculoskeletal: Strength & Muscle Tone: within normal limits Gait & Station: normal Patient leans: N/A  Psychiatric Specialty Exam: ROS   Blood pressure 122/64, pulse 80, height 5\' 4"  (1.626 m), weight 61.7 kg.Body mass index is 23.34 kg/m.  General Appearance: Well Groomed  Eye Contact:  Good  Speech:  Normal Rate  Volume:  Normal  Mood:  Euthymic  Affect:  Appropriate and Full Range  Thought Process:  Linear and Descriptions of Associations: Intact  Orientation:  Full (Time, Place, and Person)  Thought Content: No hallucinations, no delusions, not internally preoccupied   Suicidal Thoughts:  No-denies suicidal ideations, no self-injurious ideations  Homicidal Thoughts:  No  Memory:  Recent and remote grossly intact  Judgement:  Other:  Present  Insight:  Present  Psychomotor Activity:  Normal  Concentration:  Concentration: Good and Attention Span: Good  Recall:   Good  Fund of Knowledge: Good  Language: Good  Akathisia:  Negative  Handed:  Right  AIMS (if indicated): No abnormal or involuntary movements noted or reported  Assets:  Communication Skills Desire for Improvement Resilience Others:  Employed  ADL's:  Intact  Cognition: WNL  Sleep:  States sleeping about 6 hours    Screenings: GAD-7     Office Visit from 12/21/2015 in [place] for [place]  Total GAD-7 Score  4    PHQ2-9     Office Visit from 11/15/2016 in [place] Office Visit  from 09/03/2016 in [place] Office Visit from 05/10/2016 in [place] Office Visit from 12/21/2015 in [place] for [place] Visit from 12/20/2014 in [place]  PHQ-2 Total Score  0  0  0  0  1  PHQ-9 Total Score  -  -  -  3  -       Assessment and Plan: 42 year old female, history of Polar disorder, returns for medication management.  Currently stable and doing well and daily activities including home life and employment.  Currently presents euthymic with a full range of affect and does not endorse significant neurovegetative symptoms of depression.  Does not present with current symptoms of mania.  She is on Abilify which she is tolerating well thus far. Will renew medications (Abilify 20 mg daily, Vistaril 25 to 50 mg nightly for insomnia).  Patient may also elect to take OTC Melatonin for sleep, but I cautioned her regarding taking any sleeping aid that contains antihistamines, as is already on Vistaril. She will return in 3 months for her next schedule appointment but agrees to return sooner should to be any worsening or concerns prior.    Jenne Campus, MD 12/16/2017, 12:12 PM

## 2018-03-03 ENCOUNTER — Other Ambulatory Visit (HOSPITAL_COMMUNITY): Payer: Self-pay | Admitting: Psychiatry

## 2018-03-03 DIAGNOSIS — F3160 Bipolar disorder, current episode mixed, unspecified: Secondary | ICD-10-CM

## 2018-04-02 ENCOUNTER — Encounter (HOSPITAL_COMMUNITY): Payer: Self-pay | Admitting: Psychiatry

## 2018-04-02 ENCOUNTER — Ambulatory Visit (INDEPENDENT_AMBULATORY_CARE_PROVIDER_SITE_OTHER): Payer: PRIVATE HEALTH INSURANCE | Admitting: Psychiatry

## 2018-04-02 VITALS — BP 102/70 | HR 76 | Resp 12 | Ht 64.0 in | Wt 128.4 lb

## 2018-04-02 DIAGNOSIS — F411 Generalized anxiety disorder: Secondary | ICD-10-CM | POA: Diagnosis not present

## 2018-04-02 DIAGNOSIS — F3160 Bipolar disorder, current episode mixed, unspecified: Secondary | ICD-10-CM

## 2018-04-02 MED ORDER — ARIPIPRAZOLE 20 MG PO TABS: 20.0000 mg | ORAL_TABLET | Freq: Every day | ORAL | 2 refills | Status: DC

## 2018-04-02 MED ORDER — HYDROXYZINE PAMOATE 25 MG PO CAPS: ORAL_CAPSULE | ORAL | 2 refills | Status: DC

## 2018-04-02 NOTE — Progress Notes (Signed)
[place] MD/PA/NP OP Progress Note  04/02/2018 3:56 PM Bethany [name]  MRN:  [id number]  Chief Complaint:  Chief Complaint    Anxiety; Depression; Medication Refill     HPI: Patient came for her follow-up appointment.  She is been out of hydroxyzine for few days and feeling nervous and anxious.  Overall she described her mood is a stable and she denies any mania, psychosis or any hallucination.  She denies any irritability or any anger.  Her sleep is good.  She is now working for first shift since August and trying to adjust to the first shift.  She try to keep herself busy.  She mentioned her husband is doing well and there has been no recent issues.  Patient has no tremors, shakes or any EPS.  Her appetite is okay.  Her energy level is good.  She lives with her daughter and her husband.  She denies drinking or using any illegal substances.  She had a good Thanksgiving and she is looking forward to had a good Christmas.  Visit Diagnosis:    ICD-10-CM   1. GAD (generalized anxiety disorder) F41.1 hydrOXYzine (VISTARIL) 25 MG capsule  2. 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 Saw therapist in [facility] after her brother killed. She was given Zoloft and lorazepam by her primary care physician. No history of psychiatric inpatient treatment, suicidal attempt, psychosis or any mania. History of irritability, anger and mood swing. Tried Lamictal but stopped due to rash.    Past Medical History:  Past Medical History:  Diagnosis Date  . Anemia   . Anxiety   . Depression   . History of cervical dysplasia    laser surgery  . Insomnia   . Vaginal Pap smear, abnormal     Past Surgical History:  Procedure Laterality Date  . BREAST FIBROADENOMA SURGERY  2011, 2008   benign   . CESAREAN SECTION  1995  . HYSTEROSCOPY WITH NOVASURE N/A 01/02/2016   Procedure: NOVASURE;  Surgeon:  Woodroe Mode, MD;  Location: [facility];  Service: Gynecology;  Laterality: N/A;  . TUBAL LIGATION  2003    Family Psychiatric History: Reviewed.  Family History:  Family History  Problem Relation Age of Onset  . Anxiety disorder Father   . Depression Father   . Diabetes Maternal Grandmother   . Hypertension Paternal Grandfather   . Cancer Maternal Aunt        breast  . Breast cancer Maternal Aunt   . Alcohol abuse Maternal Uncle   . Alcohol abuse Cousin   . Drug abuse Cousin   . Stroke Neg Hx     Social History:  Social History   Socioeconomic History  . Marital status: Married    Spouse name: Not on file  . Number of children: Not on file  . Years of education: Not on file  . Highest education level: Not on file  Occupational History  . Not on file  Social Needs  . Financial resource strain: Not on file  . Food insecurity:    Worry: Not on file    Inability: Not on file  . Transportation needs:    Medical: Not on file    Non-medical: Not on file  Tobacco Use  . Smoking status: Never Smoker  . Smokeless tobacco: Never Used  Substance and Sexual Activity  . Alcohol use: No  Alcohol/week: 0.0 standard drinks  . Drug use: No  . Sexual activity: Yes    Partners: Male    Birth control/protection: Surgical  Lifestyle  . Physical activity:    Days per week: Not on file    Minutes per session: Not on file  . Stress: Not on file  Relationships  . Social connections:    Talks on phone: Not on file    Gets together: Not on file    Attends religious service: Not on file    Active member of club or organization: Not on file    Attends meetings of clubs or organizations: Not on file    Relationship status: Not on file  Other Topics Concern  . Not on file  Social History Narrative   Married, 3 daughters.  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          Allergies:  Allergies  Allergen Reactions  . Lamictal [Lamotrigine] Rash  . Codeine  Hives  . Oxycodone Hcl Hives    Metabolic Disorder Labs: Lab Results  Component Value Date   HGBA1C 5.4 09/19/2010   No results found for: PROLACTIN Lab Results  Component Value Date   CHOL 187 10/01/2011   TRIG 64 10/01/2011   HDL 68 10/01/2011   CHOLHDL 2.8 10/01/2011   VLDL 13 10/01/2011   LDLCALC 106 (H) 10/01/2011   LDLCALC 109 (H) 10/07/2007   Lab Results  Component Value Date   TSH 0.492 05/09/2014   TSH 0.394 05/07/2013    Therapeutic Level Labs: No results found for: LITHIUM No results found for: VALPROATE No components found for:  CBMZ  Current Medications: Current Outpatient Medications  Medication Sig Dispense Refill  . acetaminophen (TYLENOL) 500 MG tablet Take 1,000 mg by mouth every 6 (six) hours as needed for moderate pain.    . ARIPiprazole (ABILIFY) 20 MG tablet Take 1 tablet (20 mg total) by mouth daily. 30 tablet 2  . hydrOXYzine (VISTARIL) 25 MG capsule TAKE 1-2 CAPSULE (25 MG TOTAL) BY MOUTH AT BEDTIME AS NEEDED FOR ANXIETY. 60 capsule 2  . ibuprofen (ADVIL,MOTRIN) 800 MG tablet Take 1 tablet (800 mg total) by mouth every 8 (eight) hours as needed for moderate pain. 30 tablet 1   No current facility-administered medications for this visit.      Musculoskeletal: Strength & Muscle Tone: within normal limits Gait & Station: normal Patient leans: N/A  Psychiatric Specialty Exam: ROS  Blood pressure 102/70, pulse 76, resp. rate 12, height 5\' 4"  (1.626 m), weight 128 lb 6.4 oz (58.2 kg).Body mass index is 22.04 kg/m.  General Appearance: Casual  Eye Contact:  Good  Speech:  Clear and Coherent  Volume:  Normal  Mood:  Euthymic  Affect:  Congruent  Thought Process:  Goal Directed  Orientation:  Full (Time, Place, and Person)  Thought Content: Logical   Suicidal Thoughts:  No  Homicidal Thoughts:  No  Memory:  Immediate;   Good Recent;   Good Remote;   Good  Judgement:  Good  Insight:  Good  Psychomotor Activity:  Normal   Concentration:  Concentration: Good and Attention Span: Good  Recall:  Good  Fund of Knowledge: Good  Language: Good  Akathisia:  No  Handed:  Right  AIMS (if indicated): not done  Assets:  Communication Skills Desire for Improvement Housing Physical Health Resilience  ADL's:  Intact  Cognition: WNL  Sleep:  Good   Screenings: GAD-7  Office Visit from 12/21/2015 in [place] for [place]  Total GAD-7 Score  4    PHQ2-9     Office Visit from 11/15/2016 in [facility] Office Visit from 09/03/2016 in [facility] Office Visit from 05/10/2016 in [facility] Office Visit from 12/21/2015 in [place] [facility] for [place] Office Visit from 12/20/2014 in [facility]  PHQ-2 Total Score  0  0  0  0  1  PHQ-9 Total Score  -  -  -  3  -       Assessment and Plan: Bipolar disorder type I.  Generalized anxiety disorder.  Patient is a stable on her current medication.  She has no tremors shakes or any EPS.  She is not interested in counseling.  Continue Abilify 20 mg daily and Vistaril 25 mg twice a day.  Recommended to call us back if she has any question or any concern.  Follow-up in 3 months.   [name], MD 04/02/2018, 3:56 PM

## 2018-04-27 ENCOUNTER — Encounter: Payer: Self-pay | Admitting: Student in an Organized Health Care Education/Training Program

## 2018-04-27 ENCOUNTER — Ambulatory Visit (INDEPENDENT_AMBULATORY_CARE_PROVIDER_SITE_OTHER): Payer: PRIVATE HEALTH INSURANCE | Admitting: Student in an Organized Health Care Education/Training Program

## 2018-04-27 ENCOUNTER — Other Ambulatory Visit: Payer: Self-pay

## 2018-04-27 DIAGNOSIS — M7500 Adhesive capsulitis of unspecified shoulder: Secondary | ICD-10-CM | POA: Insufficient documentation

## 2018-04-27 DIAGNOSIS — N6320 Unspecified lump in the left breast, unspecified quadrant: Secondary | ICD-10-CM

## 2018-04-27 DIAGNOSIS — M7502 Adhesive capsulitis of left shoulder: Secondary | ICD-10-CM

## 2018-04-27 HISTORY — DX: Adhesive capsulitis of unspecified shoulder: M75.00

## 2018-04-27 MED ORDER — METHYLPREDNISOLONE ACETATE 40 MG/ML IJ SUSP
40.0000 mg | Freq: Once | INTRAMUSCULAR | Status: AC
  Administered 2018-04-27: 40 mg via INTRAMUSCULAR

## 2018-04-27 NOTE — Assessment & Plan Note (Signed)
-   no red flags for joint infection - 80 mg solumedrol shoulder joint injection today in the office - continue ibupron PRN pain - encourage continued movement at the joint - PT referral placed today - return precautions provided - follow up as needed

## 2018-04-27 NOTE — Progress Notes (Signed)
CC: Left shoulder pain, Left breast cystic pain  HPI: Bethany [name] is a 43 y.o. female  Presenting for L shoulder and L breast pain  Left Shoulder: She reports several months of worsening left shoulder pain and decreased mobility. 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 Pain is also worsened by sleeping on that side. She feels the pain is "in the bone" of the shoulder.  No known trauma to the shoulder, no previous surgeries.  Left Breast: Patient has a known history of well-circumscribed simple cyst at 2-O'clock position of left breast, 5cm from the nipple. This has been seen dating back to Ultrasound on 08/2016. She has multiple mammograms without evidence of malignancy. Patient has been seen in the past for this problem and pursued conservative management. However, she reports that she has significant pain associated with the cyst. The pain is sharp and bothers her on a daily basis. Sometimes she feels that the breast swells and she uses cold compression for this. She has had no nipple discharge or fevers. No unintentional weight loss or night sweats.   Review of Symptoms:  See HPI for ROS.   CC, SH/smoking status, and VS noted.  Objective: BP 98/62   Pulse 80   Temp 98.5 F (36.9 C) (Oral)   Ht 5\' 4"  (1.626 m)   Wt 131 lb 3.2 oz (59.5 kg)   SpO2 99%   BMI 22.52 kg/m  GEN: NAD, alert, cooperative, and pleasant. RESPIRATORY: Comfortable work of breathing, speaks in full sentences CV: Regular rate noted, distal extremities well perfused and warm without edema GI: Soft, nondistended SKIN: warm and dry, no rashes or lesions NEURO: II-XII grossly intact MSK: Moves 4 extremities equally PSYCH: AAOx3, appropriate affect  Shoulder, left: TTP noted at the [facility] junction. No evidence of bony deformity, asymmetry, or muscle atrophy; No tenderness over long head of biceps (bicipital groove). No TTP at [employer] joint. Limited ROM in external  rotation of the arm at the shoulder. Abducts to <90 degrees before having pain. Sensation and peripheral pulses intact.  Breast, left: two discrete nodularities noted in the left breast lateral to the nipple, tender to palpation.   Left Shoulder INJECTION: Patient was given informed consent, signed copy in the chart. Appropriate time out was taken. Area prepped  in usual sterile fashion. Ultrasound used to identify internal structures. 2 cc of methylprednisolone 40 mg/ml plus  8 cc of 1% lidocaine with epinephrine was injected into the shoulder joint using a(n) sub-acriomial approach. The patient tolerated the procedure well. There were no complications. Post procedure instructions were given.  Assessment and plan:  Adhesive capsulitis - no red flags for joint infection - 80 mg solumedrol shoulder joint injection today in the office - continue ibupron PRN pain - encourage continued movement at the joint - PT referral placed today - return precautions provided - follow up as needed   Orders Placed This Encounter  Procedures  . Ambulatory referral to Physical Therapy    Referral Priority:   Routine    Referral Type:   Physical Medicine    Referral Reason:   Specialty Services Required    Requested Specialty:   Physical Therapy    Number of Visits Requested:   1  . Ambulatory referral to General Surgery    Referral Priority:   Routine    Referral Type:   Surgical    Referral Reason:   Specialty Services Required  Requested Specialty:   General Surgery    Number of Visits Requested:   1    Meds ordered this encounter  Medications  . methylPREDNISolone acetate (DEPO-MEDROL) injection 40 mg  . methylPREDNISolone acetate (DEPO-MEDROL) injection 40 mg     Everrett Coombe, MD,MS,  PGY3 04/29/2018 2:22 PM

## 2018-04-27 NOTE — Patient Instructions (Signed)
It was a pleasure seeing you today in our clinic. Today we discussed shoulder pain and breast pain. Here is the treatment plan we have discussed and agreed upon together:  A consult was placed to PT at today's visit.  You will receive a call to schedule an appointment. If you do not receive a call within two weeks please call our office so we can place the consult again.  A consult was placed to General Surgery at today's visit.  You will receive a call to schedule an appointment. If you do not receive a call within two weeks please call our office so we can place the consult again.  - You may be sore over the next day, however please continue to move around so the joint does not get stiff. - Use ibuprofen as needed for pain at home - If you notice an area of redness with warmth, swelling, pain, or you develop fevers, these would be reasons to call our office back or come in to be seen.  Our clinic's number is [phone]. Please call with questions or concerns about what we discussed today.  Be well, Dr. Burr Medico

## 2018-05-19 ENCOUNTER — Ambulatory Visit: Payer: PRIVATE HEALTH INSURANCE | Admitting: Physical Therapy

## 2018-05-22 ENCOUNTER — Ambulatory Visit: Payer: PRIVATE HEALTH INSURANCE | Admitting: Family Medicine

## 2018-05-22 ENCOUNTER — Other Ambulatory Visit: Payer: Self-pay

## 2018-05-22 VITALS — BP 110/64 | Temp 98.6°F | Wt 131.0 lb

## 2018-05-22 DIAGNOSIS — M7502 Adhesive capsulitis of left shoulder: Secondary | ICD-10-CM | POA: Diagnosis not present

## 2018-05-22 DIAGNOSIS — N6320 Unspecified lump in the left breast, unspecified quadrant: Secondary | ICD-10-CM

## 2018-05-22 MED ORDER — TRAMADOL HCL 50 MG PO TABS: 50.0000 mg | ORAL_TABLET | Freq: Three times a day (TID) | ORAL | 2 refills | Status: DC | PRN

## 2018-05-22 NOTE — Patient Instructions (Addendum)
Please continue range of motion exercises for your shoulder to prevent worsening of the frozen shoulder.  Continue ibuprofen and tylenol, use tramadol for severe pain.  If you have questions or concerns please do not hesitate to call at [phone].  Lucila Maine, DO PGY-3, [facility] 05/22/2018 2:27 PM   Adhesive Capsulitis  Adhesive capsulitis, also called frozen shoulder, causes the shoulder to become stiff and painful to move. This condition happens when there is inflammation of the tendons and ligaments that surround the shoulder joint (shoulder capsule). What are the causes? This condition may be caused by:  An injury to your shoulder joint.  Straining your shoulder.  Not moving your shoulder for a period of time. This can happen if your arm was injured or in a sling.  Long-standing conditions, such as: ? Diabetes. ? Thyroid problems. ? Heart disease. ? Stroke. ? Rheumatoid arthritis. ? Lung disease. In some cases, the cause is not known. What increases the risk? You are more likely to develop this condition if you are:  A woman.  Older than 45 years of age. What are the signs or symptoms? Symptoms of this condition include:  Pain in your shoulder when you move your arm. There may also be pain when parts of your shoulder are touched. The pain may be worse at night or when you are resting.  A sore or aching shoulder.  The inability to move your shoulder normally.  Muscle spasms. How is this diagnosed? This condition is diagnosed with a physical exam and imaging tests, such as an X-ray or MRI. How is this treated? This condition may be treated with:  Treatment of the underlying cause or condition.  Medicine. Medicine may be given to relieve pain, inflammation, or muscle spasms.  Steroid injections into the shoulder joint.  Physical therapy. This involves performing exercises to get the shoulder moving again.  Acupuncture. This is a  type of treatment that involves stimulating specific points on your body by inserting thin needles through your skin.  Shoulder manipulation. This is a procedure to move the shoulder into another position. It is done after you are given a medicine to make you fall asleep (general anesthetic). The joint may also be injected with salt water at high pressure to break down scarring.  Surgery. This may be done in severe cases when other treatments have failed. Although most people recover completely from adhesive capsulitis, some may not regain full shoulder movement. Follow these instructions at home: Managing pain, stiffness, and swelling      If directed, put ice on the injured area: ? Put ice in a plastic bag. ? Place a towel between your skin and the bag. ? Leave the ice on for 20 minutes, 2-3 times per day.  If directed, apply heat to the affected area before you exercise. Use the heat source that your health care provider recommends, such as a moist heat pack or a heating pad. ? Place a towel between your skin and the heat source. ? Leave the heat on for 20-30 minutes. ? Remove the heat if your skin turns bright red. This is especially important if you are unable to feel pain, heat, or cold. You may have a greater risk of getting burned. General instructions  Take over-the-counter and prescription medicines only as told by your health care provider.  If you are being treated with physical therapy, follow instructions from your physical therapist.  Avoid exercises that put a lot of demand on  your shoulder, such as throwing. These exercises can make pain worse.  Keep all follow-up visits as told by your health care provider. This is important. Contact a health care provider if:  You develop new symptoms.  Your symptoms get worse. Summary  Adhesive capsulitis, also called frozen shoulder, causes the shoulder to become stiff and painful to move.  You are more likely to have this  condition if you are a woman and over age 45.  It is treated with physical therapy, medicines, and sometimes surgery. This information is not intended to replace advice given to you by your health care provider. Make sure you discuss any questions you have with your health care provider. Document Released: 02/03/2009 Document Revised: 09/12/2017 Document Reviewed: 09/12/2017 Elsevier Interactive Patient Education  2019 [employer].

## 2018-05-22 NOTE — Progress Notes (Signed)
    Subjective:    Patient ID: Bethany [name], female    DOB: [birth year], 43 y.o.   MRN: [id number]   CC: pain  HPI: patient complaining of pain in her left shoulder and left breast. She has a mass in her left breast with plans to undergo radical mastectomy and reconstructive surgery in February. 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 She had this injected last month with minimal improvement. She has been doing some range of motion exercises at home but cannot abduct her shoulder past 90 degrees. She is taking 500 mg tylenol daily and 800 mg ibuprofen 2-3 times a day for pain with minimal improvement.   She is allergic to codeine and gets hives, she believes she has had tramadol in the past and tolerated it without problem.   Smoking status reviewed- non-smoker  Review of Systems- no upper extremity weakness, tingling, neck pain. Breast mass present.    Objective:  BP 110/64   Temp 98.6 F (37 C) (Oral)   Wt 131 lb (59.4 kg)   BMI 22.49 kg/m  Vitals and nursing note reviewed  General: well nourished, in no acute distress HEENT: normocephalic, MMM MSK: limited ROM of L shoulder compared to right Neuro: alert and oriented, no focal deficits   Assessment & Plan:    1. Adhesive capsulitis of left shoulder Rx for tramadol 50 mg q8 hours as needed sent to pharmacy. Advised to take this for breakthrough pain or severe pain. Advised continuing tylenol but increase frequency to every 6 hours. Advised continuing ibuprofen 800 mg q8 hours. Handout given with review of ROM exercises and advice to ice shoulder as needed. Hopefully after surgery patient will be able to work on this with PT. Follow up as needed   2. Left breast mass Pain control as above, plan for bilateral mastectomy in February. Patient to see plastic surgery for pre-op appt on 06/02/18.   Return if symptoms worsen or fail to improve.   Lucila Maine, DO Family Medicine Resident  PGY-3

## 2018-05-28 ENCOUNTER — Telehealth (HOSPITAL_COMMUNITY): Payer: Self-pay

## 2018-05-28 DIAGNOSIS — F411 Generalized anxiety disorder: Secondary | ICD-10-CM

## 2018-05-28 DIAGNOSIS — F3160 Bipolar disorder, current episode mixed, unspecified: Secondary | ICD-10-CM

## 2018-05-28 MED ORDER — HYDROXYZINE PAMOATE 25 MG PO CAPS: ORAL_CAPSULE | ORAL | 2 refills | Status: DC

## 2018-05-28 NOTE — Telephone Encounter (Signed)
Patient is calling because she does not follow up until March and she is scheduled to have a double mastectomy this month. Patient endorsed increased depression and anxiety, she says she is not in a good place and would like to be a little better before her surgery. Please review and advise, thank you

## 2018-05-28 NOTE — Addendum Note (Signed)
Addended by: Lethea Killings on: 05/28/2018 04:34 PM   Modules accepted: Orders

## 2018-05-28 NOTE — Telephone Encounter (Signed)
I called patient back and let her know to increase the Hydroxyzine. Patient was agreeable to this plan and I sent a new order to the pharmacy

## 2018-05-28 NOTE — Telephone Encounter (Signed)
I called patient and left a message.  If she calls back she can try hydroxyzine 25 mg up to 3 times a day.

## 2018-06-02 ENCOUNTER — Ambulatory Visit: Payer: PRIVATE HEALTH INSURANCE | Admitting: Plastic Surgery

## 2018-06-02 ENCOUNTER — Encounter: Payer: Self-pay | Admitting: Plastic Surgery

## 2018-06-02 ENCOUNTER — Ambulatory Visit: Payer: PRIVATE HEALTH INSURANCE | Admitting: Student in an Organized Health Care Education/Training Program

## 2018-06-02 VITALS — BP 108/72 | HR 67 | Temp 98.6°F | Ht 64.0 in | Wt 131.0 lb

## 2018-06-02 DIAGNOSIS — N649 Disorder of breast, unspecified: Secondary | ICD-10-CM | POA: Diagnosis not present

## 2018-06-02 DIAGNOSIS — N6320 Unspecified lump in the left breast, unspecified quadrant: Secondary | ICD-10-CM | POA: Diagnosis not present

## 2018-06-02 DIAGNOSIS — N63 Unspecified lump in unspecified breast: Secondary | ICD-10-CM

## 2018-06-02 DIAGNOSIS — Z803 Family history of malignant neoplasm of breast: Secondary | ICD-10-CM

## 2018-06-02 HISTORY — DX: Family history of malignant neoplasm of breast: Z80.3

## 2018-06-02 NOTE — Progress Notes (Signed)
Patient ID: Bethany [name], female    DOB: [birth year], 43 y.o.   MRN: [id number]   Chief Complaint  Patient presents with  . Advice Only    for breast reconstruction  . Breast Problem    The patient is a 43 year old black female here with her mom for evaluation of her breasts.  She has a very strong family history of breast cancer in young individuals including her aunt and her great aunts.  She noticed some severe tenderness on the left breast.  She underwent an mammogram and was found to have D density breast tissue.  There is a great concern that there will not be a good way to follow-up her pain and that MRI yearly is not going to be a reasonable alternative.  Due to her family history she is very concerned.  She has 3 grown daughters.  She breast-fed all 3 children.  She is 5 feet 4 inches tall.  She weighs 131 pounds and her preop bra size is 34A.  She would like to be a B cup.  She is a Chief Executive Officer at [employer].  She is not a smoker and is otherwise in good health.   Review of Systems  Constitutional: Negative.  Negative for activity change and appetite change.  HENT: Negative.   Eyes: Negative.   Respiratory: Negative.  Negative for chest tightness and shortness of breath.   Gastrointestinal: Negative.  Negative for abdominal distention and abdominal pain.  Endocrine: Negative.   Genitourinary: Negative.   Musculoskeletal: Negative.   Skin: Negative.  Negative for color change and wound.  Hematological: Negative for adenopathy.  Psychiatric/Behavioral: Negative.     Past Medical History:  Diagnosis Date  . Anemia   . Anxiety   . Depression   . History of cervical dysplasia    laser surgery  . Insomnia   . Vaginal Pap smear, abnormal     Past Surgical History:  Procedure Laterality Date  . BREAST FIBROADENOMA SURGERY  2011, 2008   benign   . CESAREAN SECTION  1995  . HYSTEROSCOPY WITH NOVASURE N/A 01/02/2016   Procedure: NOVASURE;  Surgeon: Woodroe Mode, MD;  Location: [facility];  Service: Gynecology;  Laterality: N/A;  . TUBAL LIGATION  2003      Current Outpatient Medications:  .  acetaminophen (TYLENOL) 500 MG tablet, Take 1,000 mg by mouth every 6 (six) hours as needed for moderate pain., Disp: , Rfl:  .  ARIPiprazole (ABILIFY) 20 MG tablet, Take 1 tablet (20 mg total) by mouth daily., Disp: 30 tablet, Rfl: 2 .  hydrOXYzine (VISTARIL) 25 MG capsule, Take 1 capsule tid for anxiety, Disp: 90 capsule, Rfl: 2 .  ibuprofen (ADVIL,MOTRIN) 800 MG tablet, Take 1 tablet (800 mg total) by mouth every 8 (eight) hours as needed for moderate pain., Disp: 30 tablet, Rfl: 1 .  traMADol (ULTRAM) 50 MG tablet, Take 1 tablet (50 mg total) by mouth every 8 (eight) hours as needed for severe pain., Disp: 30 tablet, Rfl: 2   Objective:   Vitals:   06/02/18 0822  BP: 108/72  Pulse: 67  Temp: 98.6 F (37 C)  SpO2: 98%    Physical Exam Constitutional:      Appearance: Normal appearance.  HENT:     Head: Normocephalic and atraumatic.     Right Ear: External ear normal.     Left Ear: External ear normal.     Mouth/Throat:     Mouth:  Mucous membranes are moist.  Eyes:     Extraocular Movements: Extraocular movements intact.     Pupils: Pupils are equal, round, and reactive to light.  Neck:     Musculoskeletal: Normal range of motion.  Cardiovascular:     Rate and Rhythm: Normal rate.  Pulmonary:     Effort: Pulmonary effort is normal.  Abdominal:     General: Abdomen is flat. There is no distension.     Tenderness: There is no abdominal tenderness.  Skin:    General: Skin is warm.  Neurological:     General: No focal deficit present.     Mental Status: She is alert.  Psychiatric:        Mood and Affect: Mood normal.        Thought Content: Thought content normal.        Judgment: Judgment normal.     Assessment & Plan:  Lump or mass in breast  Left breast mass  Breast disorder in female  Family history of breast  cancer  Assessment and Plan:  A long, detailed conversation was had regarding the patient's options for breast reconstruction. Five main points, which are explained to all breast reconstruction patients, were discussed.  1. Breast reconstruction is an optional process.  2. Breast reconstruction is a multi-stage process which involves multiple surgeries spaced several months apart. The entire process can take over one year.  3. The major goal of breast reconstruction is to have the patient look normal in clothing. When naked, there will always be scars.  4. Asymmetries are often present during the reconstruction process. Several operations may be needed, including surgery to the non-cancerous breast, to achieve satisfactory results.  5. No matter the reconstructive method, there are ways that the reconstruction can fail and a secondary reconstructive plan would need to be created.   A general discussion regarding all available methods of breast reconstruction were discussed. The types of reconstructions described included.  1. Tissue expander and implant based reconstruction, both single and multi-stage approaches.  2. Autologous only reconstructions, including free abdominal-tissue based reconstructions.  3. Combination procedures, particularly latissismus dorsi flaps combined with either expanders or implants.  For each of the reconstruction methods mentioned above, the risks, benefits, alternatives, scarring, and recovery time were discussed in great detail. Specific risks detailed included bleeding, infection, hematoma, seroma, scarring, pain, wound healing complications, flap loss, fat necrosis, capsular contracture, need for implant removal, donor site complications, bulge, hernia, umbilical necrosis, need for urgent reoperation, and need for dressing changes were discussed.   Assessment  Once all reconstruction options were presented, a focused discussion was had regarding the patient's  suitability for each of these procedures.  A total of 50 minutes of face-to-face time was spent in this encounter, of which >50% was spent in counseling.  The patient is a very good candidate for immediate bilateral breast reconstruction with expanders Flex HD.  She is also a good candidate for nipple sparing mastectomies.  I have called and spoken with Dr. Marlou Starks about the above information.  After reviewing all of her options the patient agrees to this plan.  [place], DO

## 2018-06-05 ENCOUNTER — Ambulatory Visit: Payer: Self-pay | Admitting: General Surgery

## 2018-06-19 ENCOUNTER — Other Ambulatory Visit (HOSPITAL_COMMUNITY): Payer: Self-pay | Admitting: Psychiatry

## 2018-06-19 DIAGNOSIS — F3160 Bipolar disorder, current episode mixed, unspecified: Secondary | ICD-10-CM

## 2018-06-19 DIAGNOSIS — F411 Generalized anxiety disorder: Secondary | ICD-10-CM

## 2018-06-30 ENCOUNTER — Ambulatory Visit (HOSPITAL_COMMUNITY): Payer: PRIVATE HEALTH INSURANCE | Admitting: Psychiatry

## 2018-07-02 IMAGING — MG DIGITAL SCREENING BILATERAL MAMMOGRAM WITH TOMO AND CAD
8 series · 9 of 24 positions shown · non-contrast
Comparison: Previous exam(s).

CLINICAL DATA: Screening.

EXAM:
DIGITAL SCREENING BILATERAL MAMMOGRAM WITH TOMO AND CAD

[L CC synth-2D]
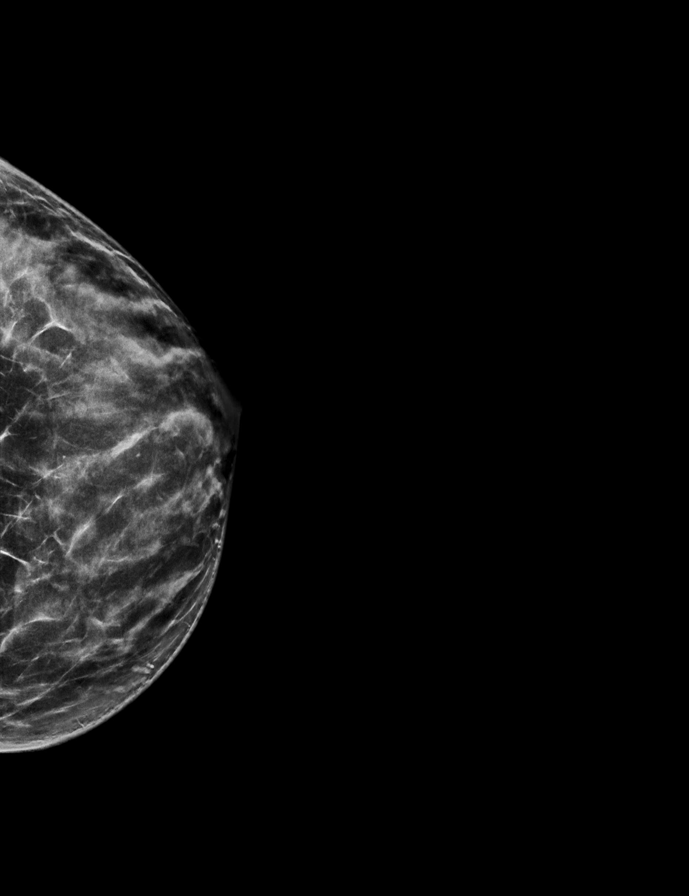

[R MLO synth-2D]
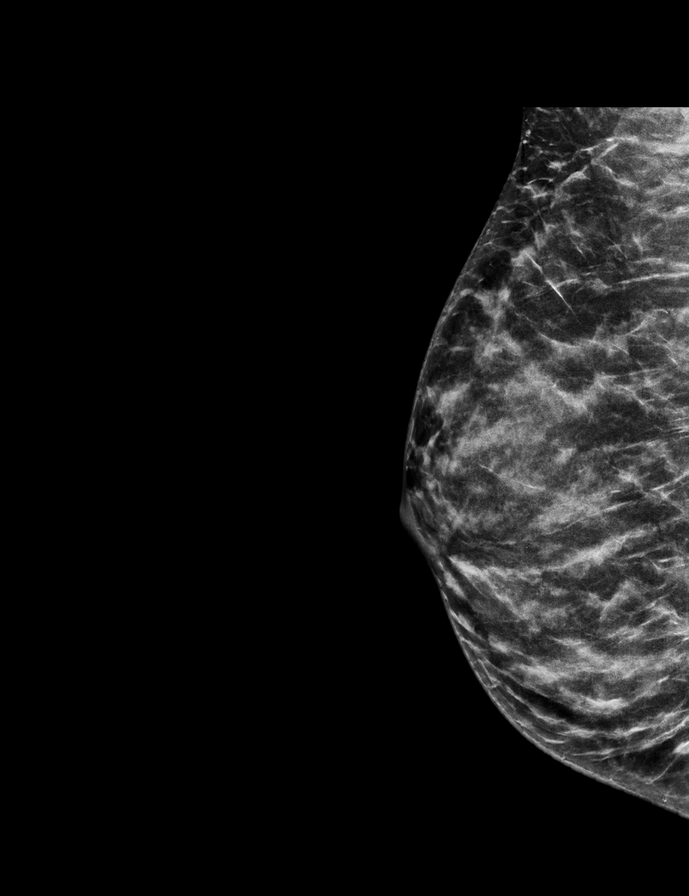

[L MLO synth-2D]
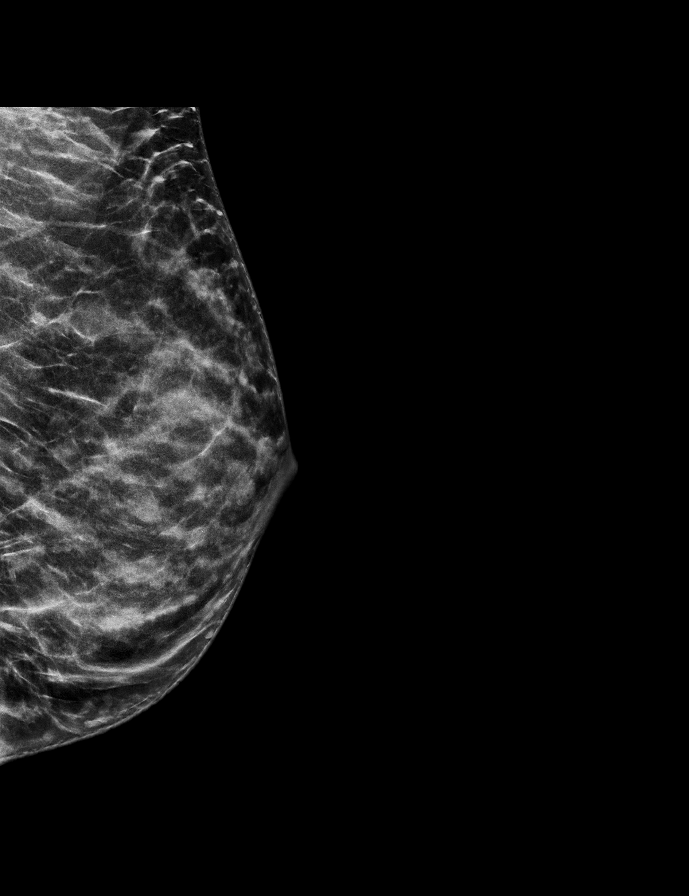

[R CC synth-2D]
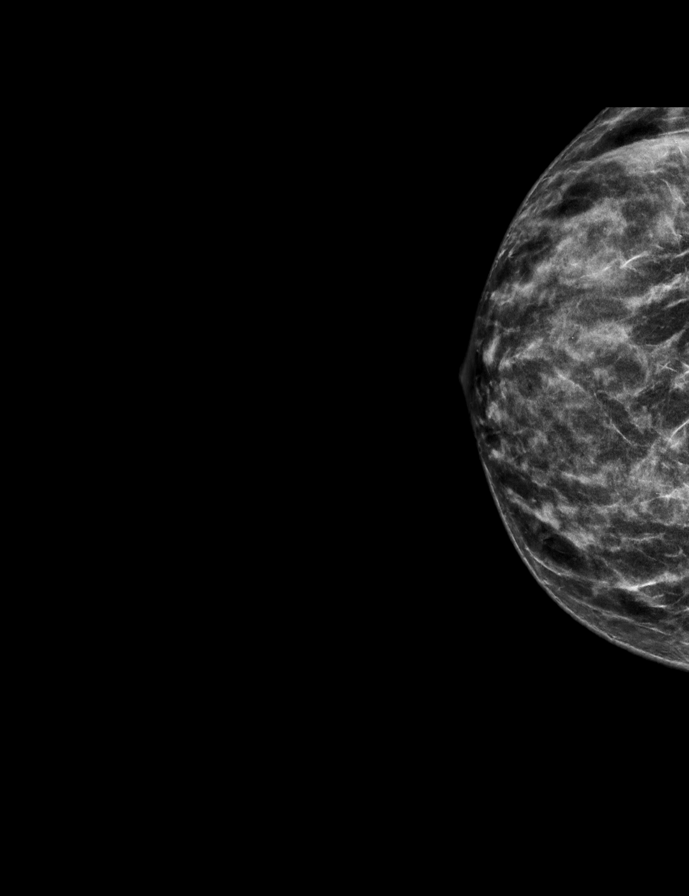

[R CC tomo · 2 of 57 frames shown]
[frame 19/57]
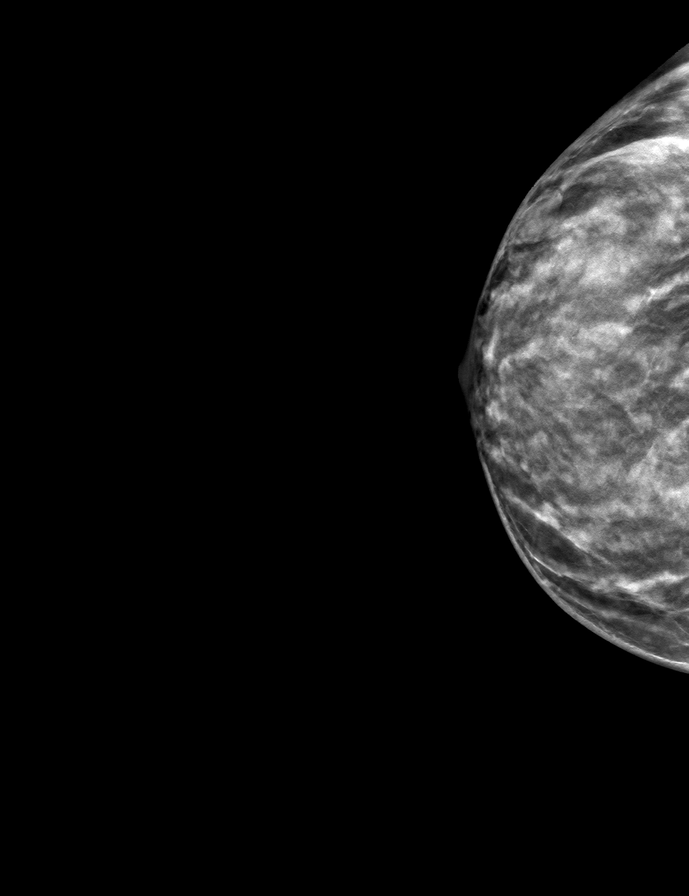
[frame 29/57]
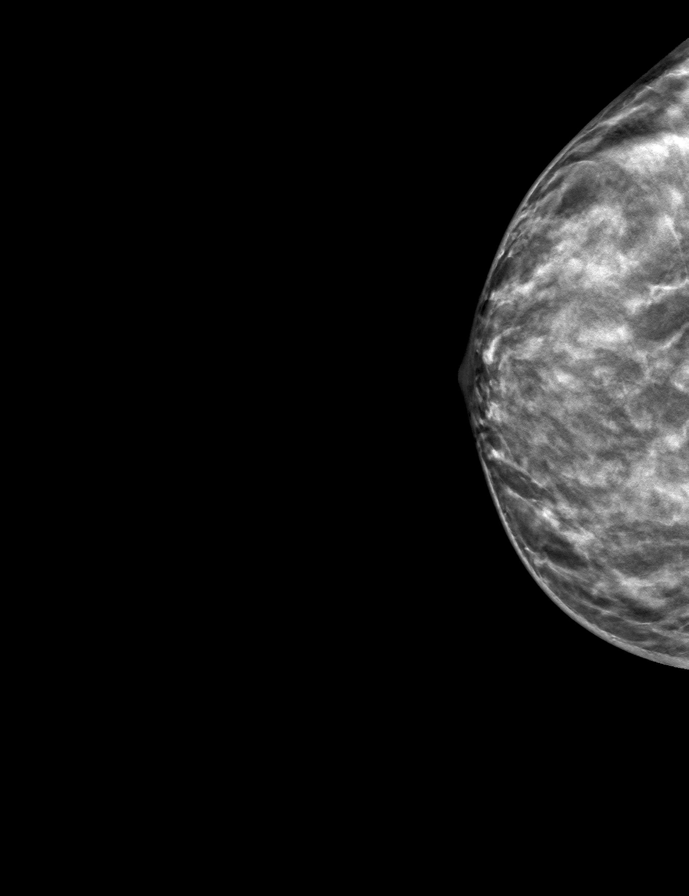

[R MLO tomo · tomo slice 27/54.0]
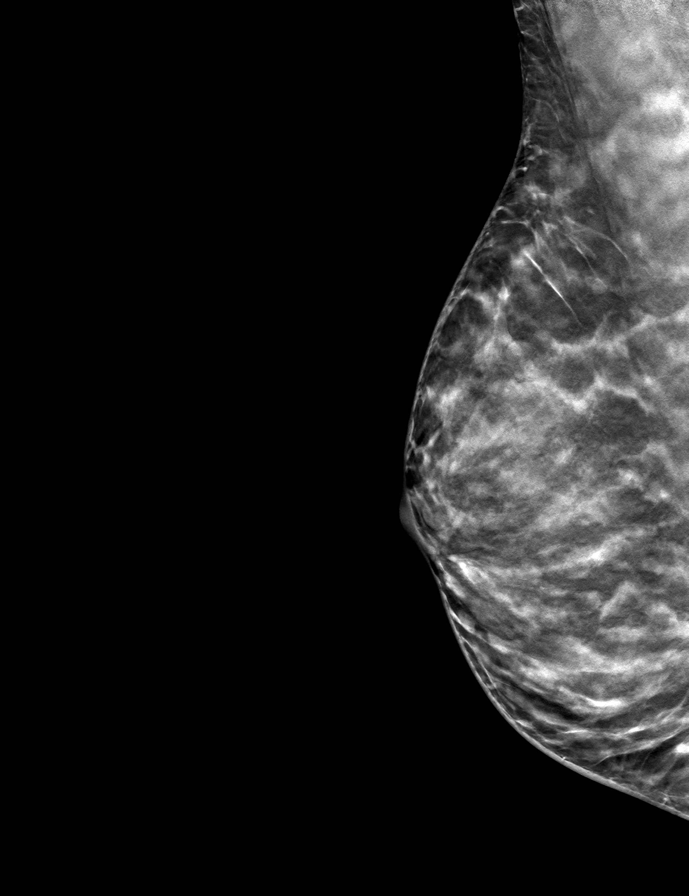

[L MLO tomo · tomo slice 28/55.0]
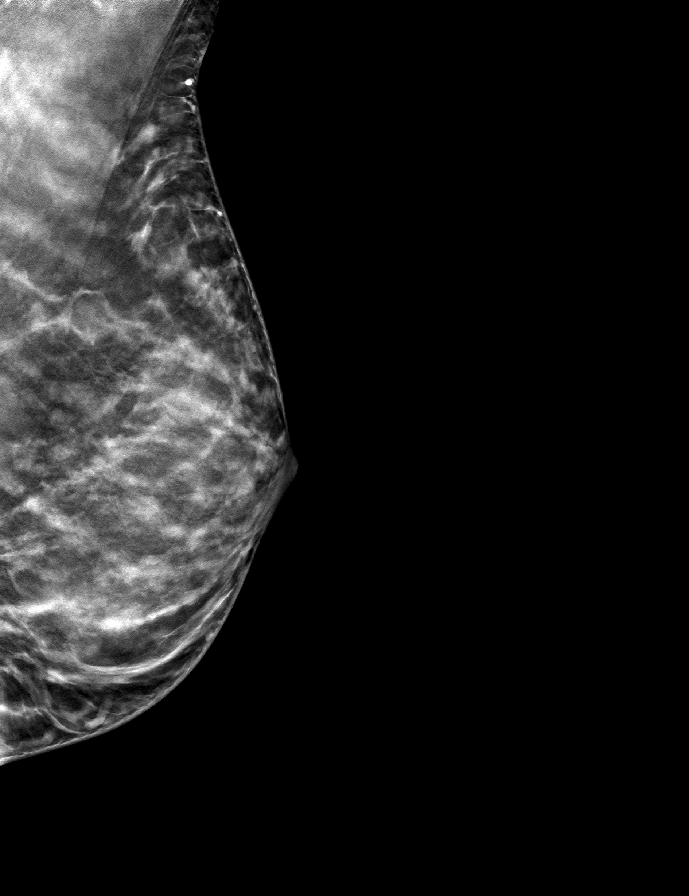

[L CC tomo · tomo slice 34/67.0]
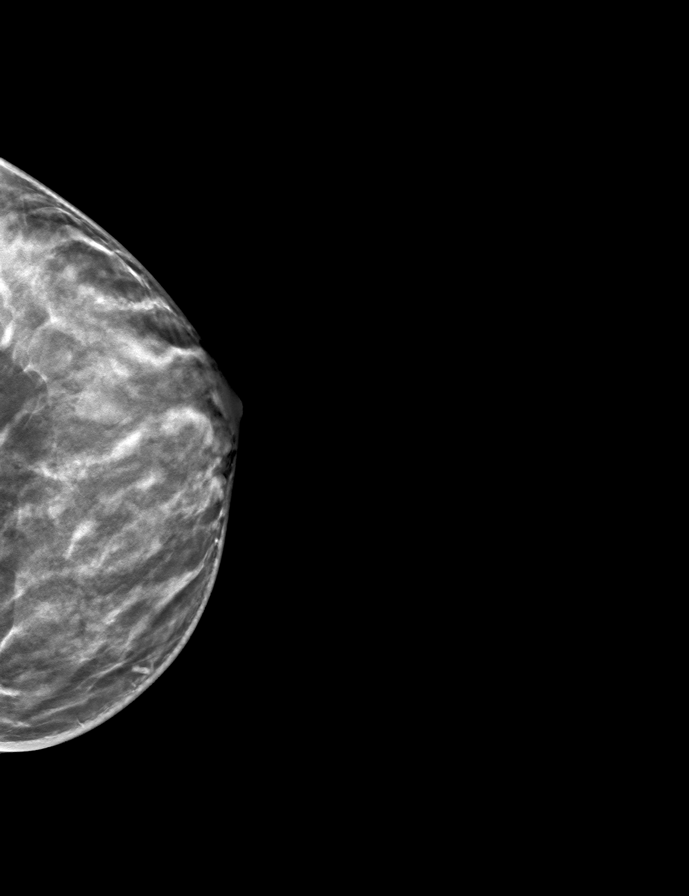

[9 of 24 positions shown; findings below may reference images not displayed]

ACR Breast Density Category d: The breast tissue is extremely dense,
which lowers the sensitivity of mammography.
FINDINGS: There are no findings suspicious for malignancy. Images were
processed with CAD.
IMPRESSION: No mammographic evidence of malignancy. A result letter of this
screening mammogram will be mailed directly to the patient.

RECOMMENDATION:
Screening mammogram in one year. (Code:[id number]-I-AVB)

BI-RADS CATEGORY  1: Negative.

## 2018-07-06 ENCOUNTER — Ambulatory Visit (INDEPENDENT_AMBULATORY_CARE_PROVIDER_SITE_OTHER): Payer: PRIVATE HEALTH INSURANCE | Admitting: Psychiatry

## 2018-07-06 ENCOUNTER — Encounter (HOSPITAL_COMMUNITY): Payer: Self-pay

## 2018-07-06 ENCOUNTER — Other Ambulatory Visit: Payer: Self-pay

## 2018-07-06 DIAGNOSIS — Z818 Family history of other mental and behavioral disorders: Secondary | ICD-10-CM

## 2018-07-06 DIAGNOSIS — R450 Nervousness: Secondary | ICD-10-CM

## 2018-07-06 DIAGNOSIS — F3160 Bipolar disorder, current episode mixed, unspecified: Secondary | ICD-10-CM | POA: Diagnosis not present

## 2018-07-06 DIAGNOSIS — Z811 Family history of alcohol abuse and dependence: Secondary | ICD-10-CM

## 2018-07-06 DIAGNOSIS — Z813 Family history of other psychoactive substance abuse and dependence: Secondary | ICD-10-CM

## 2018-07-06 DIAGNOSIS — G4700 Insomnia, unspecified: Secondary | ICD-10-CM | POA: Diagnosis not present

## 2018-07-06 DIAGNOSIS — F411 Generalized anxiety disorder: Secondary | ICD-10-CM | POA: Diagnosis not present

## 2018-07-06 MED ORDER — ARIPIPRAZOLE 20 MG PO TABS: 20.0000 mg | ORAL_TABLET | Freq: Every day | ORAL | 2 refills | Status: DC

## 2018-07-06 MED ORDER — SERTRALINE HCL 25 MG PO TABS: 25.0000 mg | ORAL_TABLET | Freq: Every day | ORAL | 2 refills | Status: DC

## 2018-07-06 MED ORDER — LORAZEPAM 0.5 MG PO TABS: 0.5000 mg | ORAL_TABLET | Freq: Two times a day (BID) | ORAL | 0 refills | Status: DC | PRN

## 2018-07-06 MED ORDER — HYDROXYZINE PAMOATE 25 MG PO CAPS: ORAL_CAPSULE | ORAL | 2 refills | Status: DC

## 2018-07-06 NOTE — Progress Notes (Signed)
[facility] MD/PA/NP OP Progress Note  07/06/2018 2:38 PM Bethany [name]  MRN:  [id number]  Chief Complaint: I am under a lot of anxiety.  I am having double mastectomy on April 1.  HPI: Bethany [name] came for her appointment.  She is under a lot of stress because she is having double mastectomy on April 1.  She is feeling very nervous, anxious despite taking hydroxyzine her anxiety continues to persist.  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  She is sleeping on and off.  She admitted having crying spells, extreme sadness but denies any suicidal thoughts.  Her husband and daughter is supportive.  She admitted lack of appetite for the past few days.  Her energy level is fair.  She denies any drinking or using any illegal substances.  She denies any paranoia or any hallucination.  She admitted sometimes gets frustrated and irritable but denies any mania or any psychosis.  She is requesting time off from the work until March 31st.  Patient admitted lack of interest in her daily activities and feels isolated withdrawn.  Visit Diagnosis:    ICD-10-CM   1. 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 GAD (generalized anxiety disorder) F41.1 sertraline (ZOLOFT) 25 MG tablet    LORazepam (ATIVAN) 0.5 MG tablet    DISCONTINUED: hydrOXYzine (VISTARIL) 25 MG capsule    Past Psychiatric History: Reviewed. Saw therapist in [facility] after brother killed. Took Zoloft and lorazepam by PCP. No h/o inpatient treatment, suicidal attempt, psychosis or any mania. H/O irritability, anger and mood swing. Tried Lamictal but stopped due to rash.    Past Medical History:  Past Medical History:  Diagnosis Date  . Anemia   . Anxiety   . Depression   . History of cervical dysplasia    laser surgery  . Insomnia   . Vaginal Pap smear, abnormal      Past Surgical History:  Procedure Laterality Date  . BREAST FIBROADENOMA SURGERY  2011, 2008   benign   . CESAREAN SECTION  1995  . HYSTEROSCOPY WITH NOVASURE N/A 01/02/2016   Procedure: NOVASURE;  Surgeon: Woodroe Mode, MD;  Location: [facility];  Service: Gynecology;  Laterality: N/A;  . TUBAL LIGATION  2003    Family Psychiatric History: Viewed.  Family History:  Family History  Problem Relation Age of Onset  . Anxiety disorder Father   . Depression Father   . Diabetes Maternal Grandmother   . Hypertension Paternal Grandfather   . Cancer Maternal Aunt        breast  . Breast cancer Maternal Aunt   . Alcohol abuse Maternal Uncle   . Alcohol abuse Cousin   . Drug abuse Cousin   . Stroke Neg Hx     Social History:  Social History   Socioeconomic History  . Marital status: Married    Spouse name: Not on file  . Number of children: Not on file  . Years of education: Not on file  . Highest education level: Not on file  Occupational History  . Not on file  Social Needs  . Financial resource strain: Not on file  . Food insecurity:    Worry: Not on file    Inability: Not on file  . Transportation needs:    Medical: Not on file  Non-medical: Not on file  Tobacco Use  . Smoking status: Never Smoker  . Smokeless tobacco: Never Used  Substance and Sexual Activity  . Alcohol use: No    Alcohol/week: 0.0 standard drinks  . Drug use: No  . Sexual activity: Yes    Partners: Male    Birth control/protection: Surgical  Lifestyle  . Physical activity:    Days per week: Not on file    Minutes per session: Not on file  . Stress: Not on file  Relationships  . Social connections:    Talks on phone: Not on file    Gets together: Not on file    Attends religious service: Not on file    Active member of club or organization: Not on file    Attends meetings of clubs or organizations: Not on file    Relationship status: Not on file  Other Topics Concern  . Not on file   Social History Narrative   Married, 3 daughters.  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          Allergies:  Allergies  Allergen Reactions  . Lamictal [Lamotrigine] Rash  . Codeine Hives  . Oxycodone Hcl Hives    Metabolic Disorder Labs: Lab Results  Component Value Date   HGBA1C 5.4 09/19/2010   No results found for: PROLACTIN Lab Results  Component Value Date   CHOL 187 10/01/2011   TRIG 64 10/01/2011   HDL 68 10/01/2011   CHOLHDL 2.8 10/01/2011   VLDL 13 10/01/2011   LDLCALC 106 (H) 10/01/2011   LDLCALC 109 (H) 10/07/2007   Lab Results  Component Value Date   TSH 0.492 05/09/2014   TSH 0.394 05/07/2013    Therapeutic Level Labs: No results found for: LITHIUM No results found for: VALPROATE No components found for:  CBMZ  Current Medications: Current Outpatient Medications  Medication Sig Dispense Refill  . acetaminophen (TYLENOL) 500 MG tablet Take 1,000 mg by mouth every 6 (six) hours as needed for moderate pain.    . ARIPiprazole (ABILIFY) 20 MG tablet Take 1 tablet (20 mg total) by mouth daily. 30 tablet 2  . hydrOXYzine (VISTARIL) 25 MG capsule Take 1 capsule tid for anxiety 90 capsule 2  . ibuprofen (ADVIL,MOTRIN) 800 MG tablet Take 1 tablet (800 mg total) by mouth every 8 (eight) hours as needed for moderate pain. 30 tablet 1  . traMADol (ULTRAM) 50 MG tablet Take 1 tablet (50 mg total) by mouth every 8 (eight) hours as needed for severe pain. 30 tablet 2   No current facility-administered medications for this visit.      Musculoskeletal: Strength & Muscle Tone: within normal limits Gait & Station: normal Patient leans: N/A  Psychiatric Specialty Exam: Review of Systems  Constitutional:       Tired  Psychiatric/Behavioral: Positive for depression. The patient is nervous/anxious and has insomnia.     Blood pressure 113/72, pulse 85, temperature 98.2 F (36.8 C), height 5\' 4"  (1.626 m), weight 127 lb (57.6 kg).There is no  height or weight on file to calculate BMI.  General Appearance: Casual and Tearful and emotional  Eye Contact:  Fair  Speech:  Slow  Volume:  Decreased  Mood:  Anxious and Dysphoric  Affect:  Constricted and Depressed  Thought Process:  Goal Directed  Orientation:  Full (Time, Place, and Person)  Thought Content: Logical   Suicidal Thoughts:  No  Homicidal Thoughts:  No  Memory:  Immediate;  Good Recent;   Good Remote;   Good  Judgement:  Good  Insight:  Good  Psychomotor Activity:  Decreased  Concentration:  Concentration: Fair and Attention Span: Fair  Recall:  [employer] of Knowledge: Good  Language: Good  Akathisia:  No  Handed:  Right  AIMS (if indicated): not done  Assets:  Communication Skills Desire for [place] Talents/Skills Transportation  ADL's:  Intact  Cognition: WNL  Sleep:  Fair   Screenings: GAD-7     Office Visit from 12/21/2015 in [place] for [place]  Total GAD-7 Score  4    PHQ2-9     Office Visit from 04/27/2018 in [place] Office Visit from 11/15/2016 in [place] Office Visit from 09/03/2016 in [place] Office Visit from 05/10/2016 in [place] Office Visit from 12/21/2015 in [place] for [facility]  PHQ-2 Total Score  0  0  0  0  0  PHQ-9 Total Score  -  -  -  -  3       Assessment and Plan: Bipolar disorder type I.  Generalized anxiety disorder.  Patient is experiencing increased anxiety, depression and feeling overwhelmed due to upcoming double mastectomy surgery.  She is having difficulty at her job.  She does not feel hydroxyzine even taking 3 times a day helping.  In the past she had tried Zoloft and Ativan which worked well and she do not recall any side effects.  I recommended to try Ativan 0.5 mg to take twice a day as needed for severe anxiety.  Discontinue hydroxyzine.  I will  also start low-dose Zoloft 25 mg daily to help her anxiety and depression.  Continue Abilify 20 mg daily.  She has no tremors shakes or any EPS.  I will take her off from work until March 31.  Patient has surgery on April 1.  We also offered she should see a therapist before her surgery and she agreed.  We will schedule appointment to see a therapist in this office.  I recommended to call us back if she has any question, concern if she feels worsening of the symptoms.  Reassurance given.  Discussed safety concern that anytime having active suicidal thoughts or homicidal thought that she need to call 911 or go to local emergency room.  Follow-up in 2 months.  Time spent 25 minutes.  More than 50% of the time spent in psychoeducation, counseling and coordination of care.   [name], MD 07/06/2018, 2:38 PM

## 2018-07-08 ENCOUNTER — Ambulatory Visit (INDEPENDENT_AMBULATORY_CARE_PROVIDER_SITE_OTHER): Payer: PRIVATE HEALTH INSURANCE | Admitting: Psychiatry

## 2018-07-08 ENCOUNTER — Encounter (HOSPITAL_COMMUNITY): Payer: Self-pay | Admitting: Psychiatry

## 2018-07-08 ENCOUNTER — Other Ambulatory Visit: Payer: Self-pay

## 2018-07-08 DIAGNOSIS — F411 Generalized anxiety disorder: Secondary | ICD-10-CM

## 2018-07-08 DIAGNOSIS — F3160 Bipolar disorder, current episode mixed, unspecified: Secondary | ICD-10-CM | POA: Diagnosis not present

## 2018-07-09 ENCOUNTER — Ambulatory Visit (INDEPENDENT_AMBULATORY_CARE_PROVIDER_SITE_OTHER): Payer: Self-pay | Admitting: Plastic Surgery

## 2018-07-09 ENCOUNTER — Other Ambulatory Visit: Payer: Self-pay

## 2018-07-09 ENCOUNTER — Encounter: Payer: Self-pay | Admitting: Plastic Surgery

## 2018-07-09 VITALS — BP 102/66 | HR 88 | Temp 98.7°F | Ht 64.0 in | Wt 127.0 lb

## 2018-07-09 DIAGNOSIS — Z803 Family history of malignant neoplasm of breast: Secondary | ICD-10-CM

## 2018-07-09 DIAGNOSIS — N6320 Unspecified lump in the left breast, unspecified quadrant: Secondary | ICD-10-CM

## 2018-07-09 MED ORDER — TRAMADOL HCL 50 MG PO TABS: 50.0000 mg | ORAL_TABLET | Freq: Three times a day (TID) | ORAL | 0 refills | Status: DC | PRN

## 2018-07-09 MED ORDER — DIAZEPAM 2 MG PO TABS: 2.0000 mg | ORAL_TABLET | Freq: Four times a day (QID) | ORAL | 0 refills | Status: DC | PRN

## 2018-07-09 MED ORDER — ONDANSETRON HCL 4 MG PO TABS: 4.0000 mg | ORAL_TABLET | Freq: Three times a day (TID) | ORAL | 0 refills | Status: AC | PRN

## 2018-07-09 MED ORDER — CEPHALEXIN 500 MG PO CAPS: 500.0000 mg | ORAL_CAPSULE | Freq: Four times a day (QID) | ORAL | 0 refills | Status: AC
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
## 2018-07-09 NOTE — Progress Notes (Signed)
Patient ID: Bethany [name], female    DOB: [birth year], 43 y.o.   MRN: [id number]   No chief complaint on file.   The patient is a 43 year old black female here for her history and physical for breast reconstruction.  She has severe tenderness of the left breast that has been very concerning to her.  She had a mammogram that showed the density breast tissue.  She was instructed that she would need an MRI yearly due to the density.  She has an extremely strong family history of breast cancer.  She breast-fed all 3 children.  She is 5 feet 4 inches tall.  She weighs 131 pounds.  Her preoperative bra size is a 34 A.  She would like to be around a B cup.  She is a Chief Executive Officer at [employer].  She does not use any tobacco.  The patient states that nobody at home is been sick.  She is planning on having bilateral mastectomies.   Review of Systems  Constitutional: Negative.  Negative for activity change and appetite change.  HENT: Negative.   Eyes: Negative.   Respiratory: Negative.   Cardiovascular: Negative.  Negative for leg swelling.  Gastrointestinal: Negative.  Negative for abdominal pain.  Endocrine: Negative.   Genitourinary: Negative.   Musculoskeletal: Negative.  Negative for back pain.  Skin: Negative.  Negative for color change and wound.  Hematological: Negative.   Psychiatric/Behavioral: Negative.     Past Medical History:  Diagnosis Date  . Anemia   . Anxiety   . Depression   . History of cervical dysplasia    laser surgery  . Insomnia   . Vaginal Pap smear, abnormal     Past Surgical History:  Procedure Laterality Date  . BREAST FIBROADENOMA SURGERY  2011, 2008   benign   . CESAREAN SECTION  1995  . HYSTEROSCOPY WITH NOVASURE N/A 01/02/2016   Procedure: NOVASURE;  Surgeon: Woodroe Mode, MD;  Location: [facility];  Service: Gynecology;  Laterality: N/A;  . TUBAL LIGATION  2003      Current Outpatient Medications:  .  acetaminophen (TYLENOL) 500 MG  tablet, Take 1,000 mg by mouth every 6 (six) hours as needed for moderate pain., Disp: , Rfl:  .  ARIPiprazole (ABILIFY) 20 MG tablet, Take 1 tablet (20 mg total) by mouth daily., Disp: 30 tablet, Rfl: 2 .  ibuprofen (ADVIL,MOTRIN) 800 MG tablet, Take 1 tablet (800 mg total) by mouth every 8 (eight) hours as needed for moderate pain. (Patient not taking: Reported on 07/08/2018), Disp: 30 tablet, Rfl: 1 .  LORazepam (ATIVAN) 0.5 MG tablet, Take 1 tablet (0.5 mg total) by mouth 2 (two) times daily as needed for anxiety., Disp: 45 tablet, Rfl: 0 .  sertraline (ZOLOFT) 25 MG tablet, Take 1 tablet (25 mg total) by mouth at bedtime., Disp: 30 tablet, Rfl: 2 .  traMADol (ULTRAM) 50 MG tablet, Take 1 tablet (50 mg total) by mouth every 8 (eight) hours as needed for severe pain., Disp: 30 tablet, Rfl: 2   Objective:   Vitals:   07/09/18 0925  BP: 102/66  Pulse: 88  Temp: 98.7 F (37.1 C)  SpO2: 100%    Physical Exam Vitals signs and nursing note reviewed.  Constitutional:      Appearance: Normal appearance.  HENT:     Head: Normocephalic and atraumatic.     Mouth/Throat:     Mouth: Mucous membranes are moist.  Eyes:     Extraocular  Movements: Extraocular movements intact.     Pupils: Pupils are equal, round, and reactive to light.  Cardiovascular:     Rate and Rhythm: Normal rate.  Pulmonary:     Effort: Pulmonary effort is normal. No respiratory distress.  Abdominal:     General: Abdomen is flat. There is no distension.  Skin:    General: Skin is warm.  Neurological:     General: No focal deficit present.     Mental Status: She is alert.  Psychiatric:        Mood and Affect: Mood normal.        Behavior: Behavior normal.        Thought Content: Thought content normal.        Judgment: Judgment normal.     Assessment & Plan:  Family history of breast cancer  Left breast mass Plan for immediate breast reconstruction with bilateral expanders and flex HD placement after  mastectomies.  The risks that can be encountered with and after placement of a breast expander placement were discussed and include the following but not limited to these: bleeding, infection, delayed healing, anesthesia risks, skin sensation changes, injury to structures including nerves, blood vessels, and muscles which may be temporary or permanent, allergies to tape, suture materials and glues, blood products, topical preparations or injected agents, skin contour irregularities, skin discoloration and swelling, deep vein thrombosis, cardiac and pulmonary complications, pain, which may persist, fluid accumulation, wrinkling of the skin over the expander, changes in nipple or breast sensation, expander leakage or rupture, faulty position of the expander, persistent pain, formation of tight scar tissue around the expander (capsular contracture), possible need for revisional surgery or staged procedures. Prescription sent to pharmacy.  Patient states she can take the tramadol and has before without difficulty as long as she takes a Benadryl.  [place], DO

## 2018-07-10 ENCOUNTER — Encounter: Payer: Self-pay | Admitting: Plastic Surgery

## 2018-07-16 ENCOUNTER — Inpatient Hospital Stay (HOSPITAL_COMMUNITY): Admission: RE | Admit: 2018-07-16 | Payer: Self-pay | Source: Ambulatory Visit

## 2018-07-20 ENCOUNTER — Other Ambulatory Visit: Payer: Self-pay

## 2018-07-20 ENCOUNTER — Encounter (HOSPITAL_COMMUNITY): Payer: Self-pay | Admitting: Psychiatry

## 2018-07-20 ENCOUNTER — Ambulatory Visit (INDEPENDENT_AMBULATORY_CARE_PROVIDER_SITE_OTHER): Payer: PRIVATE HEALTH INSURANCE | Admitting: Psychiatry

## 2018-07-20 DIAGNOSIS — F411 Generalized anxiety disorder: Secondary | ICD-10-CM | POA: Diagnosis not present

## 2018-07-20 NOTE — Progress Notes (Signed)
Virtual Visit via Telephone Note  I connected with Bethany [name] on 07/20/18 at  2:30 PM EDT by telephone and verified that I am speaking with the correct person using two identifiers.   I discussed the limitations, risks, security and privacy concerns of performing an evaluation and management service by telephone and the availability of in person appointments. I also discussed with the patient that there may be a patient responsible charge related to this service. The patient expressed understanding and agreed to proceed.   History of Present Illness: Anxiety about mastectomy which was originally scheduled for 07/22/18, but has been postponed to an undetermined date due to COVID-19.    Observations/Objective: Counselor observed that Bethany [name] was in "better spirits", yet continues to experience some anxiety about her upcoming surgery. Bethany [name] reported that the medications recently prescribed for mood have been "extremely helpful," reporting that increase in appetite, interest in people and activities, less teary, and happier overall. Counselor assessed pros of surgery being moved back, with Bethany [name] stating it would give her more time to emotionally process, connect with a survivor support system, to ask more questions of medical professionals, and to honor her breast through a celebration being held for her by her daughters. Counselor assessed her daily functioning and healthy coping skills. Counselor encouraged healthy habits and connection with others to avoid depression during forced isolation due to COVID-19.   Assessment and Plan: Bethany [name]'s symptoms have improved and she feels less compelled to engage in weekly therapy appointments. Counselor and Bethany [name] decided that we would check back in with each other in 3 weeks to address current symptoms and determine need for continued therapy.   Follow Up Instructions: Bethany [name] will continue connecting with family and friends and medical professionals to best  meet her needs. Counselor will call her in 3 weeks for a follow-up therapy appointment.    I discussed the assessment and treatment plan with the patient. The patient was provided an opportunity to ask questions and all were answered. The patient agreed with the plan and demonstrated an understanding of the instructions.   The patient was advised to call back or seek an in-person evaluation if the symptoms worsen or if the condition fails to improve as anticipated.  I provided 15 minutes of non-face-to-face time during this encounter.   Lise Auer, LCSW

## 2018-07-22 ENCOUNTER — Ambulatory Visit (HOSPITAL_COMMUNITY)
Admission: RE | Admit: 2018-07-22 | Payer: PRIVATE HEALTH INSURANCE | Source: Home / Self Care | Admitting: General Surgery

## 2018-07-22 ENCOUNTER — Encounter (HOSPITAL_COMMUNITY): Admission: RE | Payer: Self-pay | Source: Home / Self Care

## 2018-07-22 SURGERY — MASTECTOMY, NIPPLE SPARING
Anesthesia: General | Site: Breast | Laterality: Bilateral

## 2018-07-28 ENCOUNTER — Encounter: Payer: Self-pay | Admitting: Plastic Surgery

## 2018-07-28 ENCOUNTER — Other Ambulatory Visit (HOSPITAL_COMMUNITY): Payer: Self-pay | Admitting: Psychiatry

## 2018-07-28 DIAGNOSIS — F411 Generalized anxiety disorder: Secondary | ICD-10-CM

## 2018-07-31 ENCOUNTER — Other Ambulatory Visit (HOSPITAL_COMMUNITY): Payer: Self-pay | Admitting: Psychiatry

## 2018-07-31 ENCOUNTER — Encounter: Payer: Self-pay | Admitting: Plastic Surgery

## 2018-07-31 DIAGNOSIS — F411 Generalized anxiety disorder: Secondary | ICD-10-CM

## 2018-08-03 NOTE — Telephone Encounter (Signed)
She is getting Valium from other provider. Should not be taking ativan

## 2018-09-03 ENCOUNTER — Encounter (HOSPITAL_COMMUNITY): Payer: Self-pay | Admitting: Psychiatry

## 2018-09-03 ENCOUNTER — Ambulatory Visit (INDEPENDENT_AMBULATORY_CARE_PROVIDER_SITE_OTHER): Payer: PRIVATE HEALTH INSURANCE | Admitting: Psychiatry

## 2018-09-03 ENCOUNTER — Other Ambulatory Visit: Payer: Self-pay

## 2018-09-03 DIAGNOSIS — F3160 Bipolar disorder, current episode mixed, unspecified: Secondary | ICD-10-CM

## 2018-09-03 DIAGNOSIS — F411 Generalized anxiety disorder: Secondary | ICD-10-CM | POA: Diagnosis not present

## 2018-09-03 MED ORDER — LORAZEPAM 0.5 MG PO TABS: 0.5000 mg | ORAL_TABLET | Freq: Every day | ORAL | 1 refills | Status: DC | PRN

## 2018-09-03 MED ORDER — ARIPIPRAZOLE 20 MG PO TABS: 20.0000 mg | ORAL_TABLET | Freq: Every day | ORAL | 2 refills | Status: DC

## 2018-09-03 MED ORDER — SERTRALINE HCL 25 MG PO TABS: 25.0000 mg | ORAL_TABLET | Freq: Every day | ORAL | 2 refills | Status: DC

## 2018-09-03 NOTE — Progress Notes (Signed)
Virtual Visit via Telephone Note  I connected with Bethany [name] on 09/03/18 at  2:20 PM EDT by telephone and verified that I am speaking with the correct person using two identifiers.   I discussed the limitations, risks, security and privacy concerns of performing an evaluation and management service by telephone and the availability of in person appointments. I also discussed with the patient that there may be a patient responsible charge related to this service. The patient expressed understanding and agreed to proceed.   History of Present Illness: Patient was evaluated by phone session.  On her last visit we started her on low-dose Zoloft and lorazepam for anxiety.  She is having a lot of anxiety and nervousness due to having upcoming double mastectomy surgery.  Her surgery is postponed until June 29.  She is feeling better.  She denies any major panic attacks since then.  She takes lorazepam but is helping her anxiety.  She also like this low-dose Zoloft which is keeping her calm.  She is sleeping better.  She also started therapy in our office but is helping her a lot.  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  She admitted some concerns due to COVID-19 but she is trying to keep herself busy.  She had a good support from her husband.  Energy level is good.  She denies any crying spells or any feeling of hopelessness or worthlessness.  She feel her mood is better and she denies any mania or any psychosis.  She is keeping her weight under control.  She weight yesterday and it was 136.4 pounds.  Patient reported no tremors, shakes from the medication.     Past Psychiatric History: Reviewed. [facility]therapist [facility] after brother killed. Took Zoloft and lorazepam by PCP. Noh/o inpatient treatment, suicidal attempt, psychosis or any mania. H/O irritability, anger and mood swing. Tried Lamictal but stopped due to  rash.    Observations/Objective: Mental status examination done on the phone.  Patient described her mood better.  Her speech is clear, coherent.  Her thought process logical and goal-directed.  Her attention concentration is better.  She denies any auditory or visual hallucination.  She denies any active or passive suicidal thoughts or homicidal thought.  There were no delusions, paranoia.  She is alert and oriented x3.  Her fund of knowledge is adequate.  Her cognition is intact.  She reported no tremors, EPS or any side effects.  Her insight and judgment is okay.  Assessment and Plan: Bipolar disorder type I.  Generalized anxiety disorder.  Patient doing better since we started low-dose Zoloft.  She does not want to increase the dose since things are going well.  She is no longer taking hydroxyzine.  Recommended to continue Zoloft 25 mg daily, Abilify 20 mg daily and lorazepam 0.5 mg as needed for severe anxiety.  Patient feels that she is ready for upcoming double mastectomy surgery for June 29.  She is also started therapy with Romelle Starcher and I encouraged to continue for coping skills.  Recommended to call us back if she has any question or any concern.  Follow-up in 3 months.  Follow Up Instructions:    I discussed the assessment and treatment plan with the patient. The patient was provided an opportunity to ask questions and all were answered. The patient agreed with the plan and demonstrated an understanding of the instructions.   The patient was advised to call  back or seek an in-person evaluation if the symptoms worsen or if the condition fails to improve as anticipated.  I provided 15 minutes of non-face-to-face time during this encounter.   Bethany [place], MD

## 2018-10-12 ENCOUNTER — Telehealth: Payer: Self-pay | Admitting: Plastic Surgery

## 2018-10-12 NOTE — Telephone Encounter (Signed)

## 2018-10-13 ENCOUNTER — Encounter: Payer: Self-pay | Admitting: Plastic Surgery

## 2018-10-13 ENCOUNTER — Ambulatory Visit (INDEPENDENT_AMBULATORY_CARE_PROVIDER_SITE_OTHER): Payer: PRIVATE HEALTH INSURANCE | Admitting: Plastic Surgery

## 2018-10-13 ENCOUNTER — Other Ambulatory Visit: Payer: Self-pay

## 2018-10-13 VITALS — BP 100/69 | HR 71 | Temp 98.9°F | Ht 64.0 in | Wt 130.4 lb

## 2018-10-13 DIAGNOSIS — N649 Disorder of breast, unspecified: Secondary | ICD-10-CM

## 2018-10-13 DIAGNOSIS — N6320 Unspecified lump in the left breast, unspecified quadrant: Secondary | ICD-10-CM

## 2018-10-13 DIAGNOSIS — Z803 Family history of malignant neoplasm of breast: Secondary | ICD-10-CM

## 2018-10-13 MED ORDER — CEPHALEXIN 500 MG PO CAPS: 500.0000 mg | ORAL_CAPSULE | Freq: Four times a day (QID) | ORAL | 0 refills | Status: AC

## 2018-10-13 MED ORDER — HYDROCODONE-ACETAMINOPHEN 5-325 MG PO TABS: 1.0000 | ORAL_TABLET | Freq: Four times a day (QID) | ORAL | 0 refills | Status: AC | PRN

## 2018-10-13 MED ORDER — ONDANSETRON HCL 4 MG PO TABS: 4.0000 mg | ORAL_TABLET | Freq: Three times a day (TID) | ORAL | 0 refills | Status: AC | PRN

## 2018-10-13 MED ORDER — DIAZEPAM 2 MG PO TABS: 2.0000 mg | ORAL_TABLET | Freq: Two times a day (BID) | ORAL | 0 refills | Status: AC | PRN

## 2018-10-13 NOTE — Progress Notes (Signed)
Patient ID: Bethany [name], female    DOB: May 11, 1975, 43 y.o.   MRN: [id number]   Chief Complaint  Patient presents with  . Pre-op Exam    (B) immediate breast reconstruction with expanders and flex HD    The patient is a 43 year old black female here discussion on her breast reconstruction.  She has had tenderness in her left breast and been worked up.  Her mammogram showed extreme density and the MRI was going to be needed on a regular basis.  Due to her very strong family history she has decided on mastectomies she is a Chief Executive Officer at [employer].  She is 5 feet 4 inches tall.  She weighs 131 pounds.  Her preoperative bra size is a 34 A.    Review of Systems  Constitutional: Negative.  Negative for activity change and appetite change.  HENT: Negative.   Eyes: Negative.   Respiratory: Negative.  Negative for chest tightness and shortness of breath.   Cardiovascular: Negative.  Negative for leg swelling.  Gastrointestinal: Negative.  Negative for abdominal distention.  Endocrine: Negative.   Genitourinary: Negative.   Musculoskeletal: Positive for joint swelling.  Hematological: Negative.   Psychiatric/Behavioral: Negative.     Past Medical History:  Diagnosis Date  . Anemia   . Anxiety   . Depression   . History of cervical dysplasia    laser surgery  . Insomnia   . Vaginal Pap smear, abnormal     Past Surgical History:  Procedure Laterality Date  . BREAST FIBROADENOMA SURGERY  2011, 2008   benign   . CESAREAN SECTION  1995  . HYSTEROSCOPY WITH NOVASURE N/A 01/02/2016   Procedure: NOVASURE;  Surgeon: Woodroe Mode, MD;  Location: [facility];  Service: Gynecology;  Laterality: N/A;  . TUBAL LIGATION  2003      Current Outpatient Medications:  .  acetaminophen (TYLENOL) 500 MG tablet, Take 1,000 mg by mouth every 6 (six) hours as needed for moderate pain., Disp: , Rfl:  .  ARIPiprazole (ABILIFY) 20 MG tablet, Take 1 tablet (20 mg total) by mouth daily.,  Disp: 30 tablet, Rfl: 2 .  LORazepam (ATIVAN) 0.5 MG tablet, Take 1 tablet (0.5 mg total) by mouth daily as needed for anxiety., Disp: 30 tablet, Rfl: 1 .  sertraline (ZOLOFT) 25 MG tablet, Take 1 tablet (25 mg total) by mouth at bedtime., Disp: 30 tablet, Rfl: 2 .  traMADol (ULTRAM) 50 MG tablet, Take 1 tablet (50 mg total) by mouth every 8 (eight) hours as needed., Disp: 30 tablet, Rfl: 0   Objective:   Vitals:   10/13/18 1031  BP: 100/69  Pulse: 71  Temp: 98.9 F (37.2 C)  SpO2: 100%    Physical Exam Vitals signs and nursing note reviewed.  Constitutional:      Appearance: Normal appearance.  HENT:     Head: Normocephalic and atraumatic.     Nose: Nose normal.  Neck:     Musculoskeletal: Normal range of motion.  Cardiovascular:     Rate and Rhythm: Normal rate.  Pulmonary:     Effort: Pulmonary effort is normal.     Breath sounds: Normal breath sounds.  Abdominal:     General: Abdomen is flat. There is no distension.  Neurological:     General: No focal deficit present.     Mental Status: She is alert and oriented to person, place, and time.  Psychiatric:        Mood and  Affect: Mood normal.        Behavior: Behavior normal.        Thought Content: Thought content normal.     Assessment & Plan:  Breast disorder in female  Family history of breast cancer  Left breast mass  Plan for bilateral immediate breast reconstruction with expander and flex HD.  Prescriptions sent to pharmacy.  Patient knows to take benadryl while taking the norco.  The risks that can be encountered with and after placement of a breast expander placement were discussed and include the following but not limited to these: bleeding, infection, delayed healing, anesthesia risks, skin sensation changes, injury to structures including nerves, blood vessels, and muscles which may be temporary or permanent, allergies to tape, suture materials and glues, blood products, topical preparations or  injected agents, skin contour irregularities, skin discoloration and swelling, deep vein thrombosis, cardiac and pulmonary complications, pain, which may persist, fluid accumulation, wrinkling of the skin over the expander, changes in nipple or breast sensation, expander leakage or rupture, faulty position of the expander, persistent pain, formation of tight scar tissue around the expander (capsular contracture), possible need for revisional surgery or staged procedures.   [place], DO

## 2018-10-13 NOTE — H&P (View-Only) (Signed)
Patient ID: Bethany [name], female    DOB: 1976/04/17, 43 y.o.   MRN: [id number]   Chief Complaint  Patient presents with  . Pre-op Exam    (B) immediate breast reconstruction with expanders and flex HD    The patient is a 43 year old black female here discussion on her breast reconstruction.  She has had tenderness in her left breast and been worked up.  Her mammogram showed extreme density and the MRI was going to be needed on a regular basis.  Due to her very strong family history she has decided on mastectomies she is a Chief Executive Officer at [employer].  She is 5 feet 4 inches tall.  She weighs 131 pounds.  Her preoperative bra size is a 34 A.    Review of Systems  Constitutional: Negative.  Negative for activity change and appetite change.  HENT: Negative.   Eyes: Negative.   Respiratory: Negative.  Negative for chest tightness and shortness of breath.   Cardiovascular: Negative.  Negative for leg swelling.  Gastrointestinal: Negative.  Negative for abdominal distention.  Endocrine: Negative.   Genitourinary: Negative.   Musculoskeletal: Positive for joint swelling.  Hematological: Negative.   Psychiatric/Behavioral: Negative.     Past Medical History:  Diagnosis Date  . Anemia   . Anxiety   . Depression   . History of cervical dysplasia    laser surgery  . Insomnia   . Vaginal Pap smear, abnormal     Past Surgical History:  Procedure Laterality Date  . BREAST FIBROADENOMA SURGERY  2011, 2008   benign   . CESAREAN SECTION  1995  . HYSTEROSCOPY WITH NOVASURE N/A 01/02/2016   Procedure: NOVASURE;  Surgeon: Woodroe Mode, MD;  Location: [facility];  Service: Gynecology;  Laterality: N/A;  . TUBAL LIGATION  2003      Current Outpatient Medications:  .  acetaminophen (TYLENOL) 500 MG tablet, Take 1,000 mg by mouth every 6 (six) hours as needed for moderate pain., Disp: , Rfl:  .  ARIPiprazole (ABILIFY) 20 MG tablet, Take 1 tablet (20 mg total) by mouth daily.,  Disp: 30 tablet, Rfl: 2 .  LORazepam (ATIVAN) 0.5 MG tablet, Take 1 tablet (0.5 mg total) by mouth daily as needed for anxiety., Disp: 30 tablet, Rfl: 1 .  sertraline (ZOLOFT) 25 MG tablet, Take 1 tablet (25 mg total) by mouth at bedtime., Disp: 30 tablet, Rfl: 2 .  traMADol (ULTRAM) 50 MG tablet, Take 1 tablet (50 mg total) by mouth every 8 (eight) hours as needed., Disp: 30 tablet, Rfl: 0   Objective:   Vitals:   10/13/18 1031  BP: 100/69  Pulse: 71  Temp: 98.9 F (37.2 C)  SpO2: 100%    Physical Exam Vitals signs and nursing note reviewed.  Constitutional:      Appearance: Normal appearance.  HENT:     Head: Normocephalic and atraumatic.     Nose: Nose normal.  Neck:     Musculoskeletal: Normal range of motion.  Cardiovascular:     Rate and Rhythm: Normal rate.  Pulmonary:     Effort: Pulmonary effort is normal.     Breath sounds: Normal breath sounds.  Abdominal:     General: Abdomen is flat. There is no distension.  Neurological:     General: No focal deficit present.     Mental Status: She is alert and oriented to person, place, and time.  Psychiatric:        Mood and  Affect: Mood normal.        Behavior: Behavior normal.        Thought Content: Thought content normal.     Assessment & Plan:  Breast disorder in female  Family history of breast cancer  Left breast mass  Plan for bilateral immediate breast reconstruction with expander and flex HD.  Prescriptions sent to pharmacy.  Patient knows to take benadryl while taking the norco.  The risks that can be encountered with and after placement of a breast expander placement were discussed and include the following but not limited to these: bleeding, infection, delayed healing, anesthesia risks, skin sensation changes, injury to structures including nerves, blood vessels, and muscles which may be temporary or permanent, allergies to tape, suture materials and glues, blood products, topical preparations or  injected agents, skin contour irregularities, skin discoloration and swelling, deep vein thrombosis, cardiac and pulmonary complications, pain, which may persist, fluid accumulation, wrinkling of the skin over the expander, changes in nipple or breast sensation, expander leakage or rupture, faulty position of the expander, persistent pain, formation of tight scar tissue around the expander (capsular contracture), possible need for revisional surgery or staged procedures.   [place], DO

## 2018-10-14 ENCOUNTER — Other Ambulatory Visit: Payer: Self-pay

## 2018-10-14 ENCOUNTER — Encounter (HOSPITAL_COMMUNITY)
Admission: RE | Admit: 2018-10-14 | Discharge: 2018-10-14 | Disposition: A | Discharge status: Home / Self Care | Payer: PRIVATE HEALTH INSURANCE | Source: Ambulatory Visit | Attending: General Surgery | Admitting: General Surgery

## 2018-10-14 ENCOUNTER — Ambulatory Visit: Payer: Self-pay | Admitting: General Surgery

## 2018-10-14 ENCOUNTER — Encounter (HOSPITAL_COMMUNITY): Payer: Self-pay

## 2018-10-14 DIAGNOSIS — Z1159 Encounter for screening for other viral diseases: Secondary | ICD-10-CM | POA: Insufficient documentation

## 2018-10-14 DIAGNOSIS — Z01812 Encounter for preprocedural laboratory examination: Secondary | ICD-10-CM | POA: Diagnosis present

## 2018-10-14 HISTORY — DX: Diffuse cystic mastopathy of left breast: N60.11

## 2018-10-14 HISTORY — DX: Diffuse cystic mastopathy of right breast: N60.12

## 2018-10-14 HISTORY — DX: Diffuse cystic mastopathy of right breast: N60.11

## 2018-10-14 LAB — CBC
HCT: 42.3 % (ref 36.0–46.0)
Hemoglobin: 13.3 g/dL (ref 12.0–15.0)
MCH: 28.6 pg (ref 26.0–34.0)
MCHC: 31.4 g/dL (ref 30.0–36.0)
MCV: 91 fL (ref 80.0–100.0)
Platelets: 196 10*3/uL (ref 150–400)
RBC: 4.65 MIL/uL (ref 3.87–5.11)
RDW: 13.4 % (ref 11.5–15.5)
WBC: 5.3 10*3/uL (ref 4.0–10.5)
nRBC: 0 % (ref 0.0–0.2)

## 2018-10-14 LAB — BASIC METABOLIC PANEL
Anion gap: 11 (ref 5–15)
BUN: 6 mg/dL (ref 6–20)
CO2: 22 mmol/L (ref 22–32)
Calcium: 9.3 mg/dL (ref 8.9–10.3)
Chloride: 106 mmol/L (ref 98–111)
Creatinine, Ser: 1 mg/dL (ref 0.44–1.00)
GFR calc Af Amer: >60 mL/min (ref >60)
GFR calc non Af Amer: >60 mL/min (ref >60)
Glucose, Bld: 87 mg/dL (ref 70–99)
Potassium: 3.4 mmol/L — ABNORMAL LOW (ref 3.5–5.1)
Sodium: 139 mmol/L (ref 135–145)

## 2018-10-14 NOTE — Progress Notes (Signed)
PCP - Everrett Coombe Cardiologist - denies  Chest x-ray - not needed EKG - not needed Stress Test - denies ECHO - denies Cardiac Cath - denies   Anesthesia review: NO  Patient denies shortness of breath, fever, cough and chest pain at PAT appointment   Patient verbalized understanding of instructions that were given to them at the PAT appointment. Patient was also instructed that they will need to review over the PAT instructions again at home before surgery.

## 2018-10-14 NOTE — Progress Notes (Signed)
[id number] [address], [place] [address] Phone: [phone] Fax: [phone]  [id number] - [place], [place] [address] [place] [address] Phone: [phone] Fax: [phone]      Your procedure is scheduled on June 29  Report to [facility] Main Entrance "A" at 0600 A.M., and check in at the Admitting office.  Call this number if you have problems the morning of surgery:  [phone]  Call [phone] if you have any questions prior to your surgery date Monday-Friday 8am-4pm    Remember:  Do not eat or drink after midnight.    Take these medicines the morning of surgery with A SIP OF WATER  acetaminophen (TYLENOL if needed ARIPiprazole (ABILIFY) cephALEXin (KEFLEX) if still taking diazepam (VALIUM)  LORazepam (ATIVAN) traMADol (ULTRAM)  If needed for pain   7 days prior to surgery STOP taking any Aspirin (unless otherwise instructed by your surgeon), Aleve, Naproxen, Ibuprofen, Motrin, Advil, Goody's, BC's, all herbal medications, fish oil, and all vitamins.    The Morning of Surgery  Do not wear jewelry, make-up or nail polish.  Do not wear lotions, powders, or perfumes/colognes, or deodorant  Do not shave 48 hours prior to surgery.  Men may shave face and neck.  Do not bring valuables to the hospital.  [facility] is not responsible for any belongings or valuables.  If you are a smoker, DO NOT Smoke 24 hours prior to surgery IF you wear a CPAP at night please bring your mask, tubing, and machine the morning of surgery   Remember that you must have someone to transport you home after your surgery, and remain with you for 24 hours if you are discharged the same day.   Contacts, glasses, hearing aids, dentures or bridgework may not be worn into surgery.    Leave your suitcase in the car.  After surgery it may be brought to your room.  For patients admitted to the  hospital, discharge time will be determined by your treatment team.  Patients discharged the day of surgery will not be allowed to drive home.    Special instructions:   [place]- Preparing For Surgery  Before surgery, you can play an important role. Because skin is not sterile, your skin needs to be as free of germs as possible. You can reduce the number of germs on your skin by washing with CHG (chlorahexidine gluconate) Soap before surgery.  CHG is an antiseptic cleaner which kills germs and bonds with the skin to continue killing germs even after washing.    Oral Hygiene is also important to reduce your risk of infection.  Remember - BRUSH YOUR TEETH THE MORNING OF SURGERY WITH YOUR REGULAR TOOTHPASTE  Please do not use if you have an allergy to CHG or antibacterial soaps. If your skin becomes reddened/irritated stop using the CHG.  Do not shave (including legs and underarms) for at least 48 hours prior to first CHG shower. It is OK to shave your face.  Please follow these instructions carefully.   1. Shower the NIGHT BEFORE SURGERY and the MORNING OF SURGERY with CHG Soap.   2. If you chose to wash your hair, wash your hair first as usual with your normal shampoo.  3. After you shampoo, rinse your hair and body thoroughly to remove the shampoo.  4. Use CHG as you would any other liquid soap. You can apply CHG directly to the skin and  wash gently with a scrungie or a clean washcloth.   5. Apply the CHG Soap to your body ONLY FROM THE NECK DOWN.  Do not use on open wounds or open sores. Avoid contact with your eyes, ears, mouth and genitals (private parts). Wash Face and genitals (private parts)  with your normal soap.   6. Wash thoroughly, paying special attention to the area where your surgery will be performed.  7. Thoroughly rinse your body with warm water from the neck down.  8. DO NOT shower/wash with your normal soap after using and rinsing off the CHG Soap.  9. Pat  yourself dry with a CLEAN TOWEL.  10. Wear CLEAN PAJAMAS to bed the night before surgery, wear comfortable clothes the morning of surgery  11. Place CLEAN SHEETS on your bed the night of your first shower and DO NOT SLEEP WITH PETS.    Day of Surgery:  Do not apply any deodorants/lotions.  Please wear clean clothes to the hospital/surgery center.   Remember to brush your teeth WITH YOUR REGULAR TOOTHPASTE.   Please read over the following fact sheets that you were given.

## 2018-10-15 ENCOUNTER — Other Ambulatory Visit (HOSPITAL_COMMUNITY)
Admission: RE | Admit: 2018-10-15 | Discharge: 2018-10-15 | Disposition: A | Discharge status: Home / Self Care | Payer: PRIVATE HEALTH INSURANCE | Source: Ambulatory Visit | Attending: General Surgery | Admitting: General Surgery

## 2018-10-15 DIAGNOSIS — Z01812 Encounter for preprocedural laboratory examination: Secondary | ICD-10-CM | POA: Diagnosis not present

## 2018-10-15 LAB — SARS CORONAVIRUS 2 (TAT 6-24 HRS): SARS Coronavirus 2: NEGATIVE

## 2018-10-18 NOTE — Anesthesia Preprocedure Evaluation (Addendum)
Anesthesia Evaluation  Patient identified by MRN, date of birth, ID band Patient awake    Reviewed: Allergy & Precautions, NPO status , Patient's Chart, lab work & pertinent test results  Airway Mallampati: II  TM Distance: >3 FB Neck ROM: Full    Dental  (+) Teeth Intact, Dental Advisory Given   Pulmonary neg pulmonary ROS,    Pulmonary exam normal breath sounds clear to auscultation       Cardiovascular negative cardio ROS Normal cardiovascular exam Rhythm:Regular Rate:Normal     Neuro/Psych  Headaches, PSYCHIATRIC DISORDERS Anxiety Depression    GI/Hepatic negative GI ROS, Neg liver ROS,   Endo/Other  negative endocrine ROS  Renal/GU negative Renal ROS     Musculoskeletal negative musculoskeletal ROS (+)   Abdominal   Peds  Hematology  (+) Blood dyscrasia, anemia ,   Anesthesia Other Findings Day of surgery medications reviewed with the patient.  Reproductive/Obstetrics                            Anesthesia Physical Anesthesia Plan  ASA: II  Anesthesia Plan: General   Post-op Pain Management:  Regional for Post-op pain   Induction:   PONV Risk Score and Plan: 3 and Midazolam, Scopolamine patch - Pre-op, Diphenhydramine, Dexamethasone and Ondansetron  Airway Management Planned: Oral ETT  Additional Equipment:   Intra-op Plan:   Post-operative Plan: Extubation in OR  Informed Consent: I have reviewed the patients History and Physical, chart, labs and discussed the procedure including the risks, benefits and alternatives for the proposed anesthesia with the patient or authorized representative who has indicated his/her understanding and acceptance.     Dental advisory given  Plan Discussed with: CRNA  Anesthesia Plan Comments: (Bilateral PEC blocks.)       Anesthesia Quick Evaluation

## 2018-10-19 ENCOUNTER — Ambulatory Visit (HOSPITAL_COMMUNITY): Payer: PRIVATE HEALTH INSURANCE | Admitting: Anesthesiology

## 2018-10-19 ENCOUNTER — Other Ambulatory Visit: Payer: Self-pay

## 2018-10-19 ENCOUNTER — Encounter (HOSPITAL_COMMUNITY): Payer: Self-pay | Admitting: Certified Registered Nurse Anesthetist

## 2018-10-19 ENCOUNTER — Ambulatory Visit (HOSPITAL_COMMUNITY): Payer: PRIVATE HEALTH INSURANCE

## 2018-10-19 ENCOUNTER — Encounter (HOSPITAL_COMMUNITY)
Admission: RE | Disposition: A | Discharge status: Home / Self Care | Payer: Self-pay | Source: Home / Self Care | Attending: General Surgery

## 2018-10-19 ENCOUNTER — Observation Stay (HOSPITAL_COMMUNITY)
Admission: RE | Admit: 2018-10-19 | Discharge: 2018-10-21 | Disposition: A | Discharge status: Home / Self Care | Payer: PRIVATE HEALTH INSURANCE | Attending: General Surgery | Admitting: General Surgery

## 2018-10-19 ENCOUNTER — Ambulatory Visit (HOSPITAL_COMMUNITY): Payer: PRIVATE HEALTH INSURANCE | Admitting: Vascular Surgery

## 2018-10-19 DIAGNOSIS — N6012 Diffuse cystic mastopathy of left breast: Secondary | ICD-10-CM | POA: Diagnosis not present

## 2018-10-19 DIAGNOSIS — Z803 Family history of malignant neoplasm of breast: Secondary | ICD-10-CM | POA: Insufficient documentation

## 2018-10-19 DIAGNOSIS — F329 Major depressive disorder, single episode, unspecified: Secondary | ICD-10-CM | POA: Insufficient documentation

## 2018-10-19 DIAGNOSIS — R42 Dizziness and giddiness: Secondary | ICD-10-CM | POA: Insufficient documentation

## 2018-10-19 DIAGNOSIS — N6011 Diffuse cystic mastopathy of right breast: Secondary | ICD-10-CM | POA: Insufficient documentation

## 2018-10-19 DIAGNOSIS — Z421 Encounter for breast reconstruction following mastectomy: Secondary | ICD-10-CM | POA: Diagnosis not present

## 2018-10-19 DIAGNOSIS — Z8249 Family history of ischemic heart disease and other diseases of the circulatory system: Secondary | ICD-10-CM | POA: Insufficient documentation

## 2018-10-19 DIAGNOSIS — Z09 Encounter for follow-up examination after completed treatment for conditions other than malignant neoplasm: Secondary | ICD-10-CM

## 2018-10-19 DIAGNOSIS — G4700 Insomnia, unspecified: Secondary | ICD-10-CM | POA: Diagnosis not present

## 2018-10-19 DIAGNOSIS — D649 Anemia, unspecified: Secondary | ICD-10-CM | POA: Diagnosis not present

## 2018-10-19 DIAGNOSIS — Z885 Allergy status to narcotic agent status: Secondary | ICD-10-CM | POA: Diagnosis not present

## 2018-10-19 DIAGNOSIS — N644 Mastodynia: Secondary | ICD-10-CM | POA: Diagnosis not present

## 2018-10-19 DIAGNOSIS — Z886 Allergy status to analgesic agent status: Secondary | ICD-10-CM | POA: Diagnosis not present

## 2018-10-19 DIAGNOSIS — F419 Anxiety disorder, unspecified: Secondary | ICD-10-CM | POA: Diagnosis not present

## 2018-10-19 DIAGNOSIS — Z79899 Other long term (current) drug therapy: Secondary | ICD-10-CM | POA: Diagnosis not present

## 2018-10-19 DIAGNOSIS — R51 Headache: Secondary | ICD-10-CM | POA: Insufficient documentation

## 2018-10-19 DIAGNOSIS — Z419 Encounter for procedure for purposes other than remedying health state, unspecified: Secondary | ICD-10-CM

## 2018-10-19 DIAGNOSIS — Z9013 Acquired absence of bilateral breasts and nipples: Secondary | ICD-10-CM | POA: Diagnosis present

## 2018-10-19 DIAGNOSIS — N649 Disorder of breast, unspecified: Secondary | ICD-10-CM | POA: Diagnosis present

## 2018-10-19 DIAGNOSIS — N62 Hypertrophy of breast: Secondary | ICD-10-CM | POA: Diagnosis present

## 2018-10-19 HISTORY — DX: Encounter for follow-up examination after completed treatment for conditions other than malignant neoplasm: Z09

## 2018-10-19 HISTORY — PX: BREAST RECONSTRUCTION WITH PLACEMENT OF TISSUE EXPANDER AND FLEX HD (ACELLULAR HYDRATED DERMIS): SHX6295

## 2018-10-19 HISTORY — PX: NIPPLE SPARING MASTECTOMY: SHX6537

## 2018-10-19 LAB — POCT PREGNANCY, URINE: Preg Test, Ur: NEGATIVE

## 2018-10-19 SURGERY — MASTECTOMY, NIPPLE SPARING
Anesthesia: General | Site: Breast | Laterality: Bilateral

## 2018-10-19 MED ORDER — CHLORHEXIDINE GLUCONATE CLOTH 2 % EX PADS
6.0000 | MEDICATED_PAD | Freq: Once | CUTANEOUS | Status: DC
  Administered 2018-10-19: 6 via TOPICAL

## 2018-10-19 MED ORDER — BUPIVACAINE-EPINEPHRINE (PF) 0.25% -1:200000 IJ SOLN
INTRAMUSCULAR | Status: DC | PRN
  Administered 2018-10-19 (×2): 30 mL via PERINEURAL

## 2018-10-19 MED ORDER — SODIUM CHLORIDE 0.9 % IV SOLN
INTRAVENOUS | Status: DC | PRN
  Administered 2018-10-19: 20 ug/min via INTRAVENOUS

## 2018-10-19 MED ORDER — SODIUM CHLORIDE 0.9 % IV SOLN
INTRAVENOUS | Status: AC
  Filled 2018-10-19: qty 500000

## 2018-10-19 MED ORDER — NAPROXEN 250 MG PO TABS: 500.0000 mg | ORAL_TABLET | Freq: Two times a day (BID) | ORAL | Status: DC | PRN

## 2018-10-19 MED ORDER — FENTANYL CITRATE (PF) 100 MCG/2ML IJ SOLN
INTRAMUSCULAR | Status: DC | PRN
  Administered 2018-10-19 (×2): 50 ug via INTRAVENOUS
  Administered 2018-10-19: 25 ug via INTRAVENOUS

## 2018-10-19 MED ORDER — DIPHENHYDRAMINE HCL 50 MG/ML IJ SOLN: 12.5000 mg | Freq: Four times a day (QID) | INTRAMUSCULAR | Status: DC | PRN

## 2018-10-19 MED ORDER — LACTATED RINGERS IV SOLN
INTRAVENOUS | Status: DC
  Administered 2018-10-19 (×2): via INTRAVENOUS

## 2018-10-19 MED ORDER — FENTANYL CITRATE (PF) 100 MCG/2ML IJ SOLN
25.0000 ug | INTRAMUSCULAR | Status: DC | PRN
  Administered 2018-10-19: 14:00:00 50 ug via INTRAVENOUS

## 2018-10-19 MED ORDER — HYDROCODONE-ACETAMINOPHEN 5-325 MG PO TABS
1.0000 | ORAL_TABLET | ORAL | Status: DC | PRN
  Administered 2018-10-19 – 2018-10-21 (×7): 2 via ORAL
  Filled 2018-10-19 (×8): qty 2

## 2018-10-19 MED ORDER — DIPHENHYDRAMINE HCL 50 MG/ML IJ SOLN
INTRAMUSCULAR | Status: DC | PRN
  Administered 2018-10-19: 12.5 mg via INTRAVENOUS

## 2018-10-19 MED ORDER — CLONIDINE HCL (ANALGESIA) 100 MCG/ML EP SOLN
EPIDURAL | Status: DC | PRN
  Administered 2018-10-19 (×2): 50 ug

## 2018-10-19 MED ORDER — MORPHINE SULFATE (PF) 2 MG/ML IV SOLN: 2.0000 mg | INTRAVENOUS | Status: DC | PRN

## 2018-10-19 MED ORDER — DIAZEPAM 2 MG PO TABS: 2.0000 mg | ORAL_TABLET | Freq: Two times a day (BID) | ORAL | Status: DC | PRN

## 2018-10-19 MED ORDER — LIDOCAINE 2% (20 MG/ML) 5 ML SYRINGE
INTRAMUSCULAR | Status: AC
  Filled 2018-10-19: qty 5

## 2018-10-19 MED ORDER — 0.9 % SODIUM CHLORIDE (POUR BTL) OPTIME
TOPICAL | Status: DC | PRN
  Administered 2018-10-19: 1000 mL

## 2018-10-19 MED ORDER — ACETAMINOPHEN 500 MG PO TABS
1000.0000 mg | ORAL_TABLET | ORAL | Status: AC
  Administered 2018-10-19: 07:00:00 1000 mg via ORAL
  Filled 2018-10-19: qty 2

## 2018-10-19 MED ORDER — NAPROXEN 250 MG PO TABS
500.0000 mg | ORAL_TABLET | Freq: Two times a day (BID) | ORAL | Status: DC | PRN
  Administered 2018-10-20: 10:00:00 500 mg via ORAL
  Filled 2018-10-19: qty 2

## 2018-10-19 MED ORDER — ROCURONIUM BROMIDE 10 MG/ML (PF) SYRINGE
PREFILLED_SYRINGE | INTRAVENOUS | Status: AC
  Filled 2018-10-19: qty 10

## 2018-10-19 MED ORDER — FENTANYL CITRATE (PF) 100 MCG/2ML IJ SOLN
INTRAMUSCULAR | Status: AC
  Filled 2018-10-19: qty 2

## 2018-10-19 MED ORDER — DIPHENHYDRAMINE HCL 50 MG/ML IJ SOLN
INTRAMUSCULAR | Status: AC
  Filled 2018-10-19: qty 1

## 2018-10-19 MED ORDER — CEFAZOLIN SODIUM-DEXTROSE 2-4 GM/100ML-% IV SOLN: 2.0000 g | INTRAVENOUS | Status: DC

## 2018-10-19 MED ORDER — CEFAZOLIN SODIUM-DEXTROSE 2-4 GM/100ML-% IV SOLN
2.0000 g | Freq: Three times a day (TID) | INTRAVENOUS | Status: DC
  Administered 2018-10-19 – 2018-10-21 (×6): 2 g via INTRAVENOUS
  Filled 2018-10-19 (×7): qty 100

## 2018-10-19 MED ORDER — ROCURONIUM BROMIDE 50 MG/5ML IV SOSY
PREFILLED_SYRINGE | INTRAVENOUS | Status: DC | PRN
  Administered 2018-10-19: 50 mg via INTRAVENOUS
  Administered 2018-10-19: 20 mg via INTRAVENOUS

## 2018-10-19 MED ORDER — DEXAMETHASONE SODIUM PHOSPHATE 10 MG/ML IJ SOLN
INTRAMUSCULAR | Status: AC
  Filled 2018-10-19: qty 1

## 2018-10-19 MED ORDER — ACETAMINOPHEN 325 MG PO TABS: 325.0000 mg | ORAL_TABLET | Freq: Four times a day (QID) | ORAL | Status: DC

## 2018-10-19 MED ORDER — FENTANYL CITRATE (PF) 250 MCG/5ML IJ SOLN
INTRAMUSCULAR | Status: AC
  Filled 2018-10-19: qty 5

## 2018-10-19 MED ORDER — ACETAMINOPHEN 325 MG PO TABS
325.0000 mg | ORAL_TABLET | Freq: Four times a day (QID) | ORAL | Status: DC
  Administered 2018-10-19 – 2018-10-21 (×3): 325 mg via ORAL
  Filled 2018-10-19 (×5): qty 1

## 2018-10-19 MED ORDER — DIPHENHYDRAMINE HCL 12.5 MG/5ML PO ELIX: 12.5000 mg | ORAL_SOLUTION | Freq: Four times a day (QID) | ORAL | Status: DC | PRN

## 2018-10-19 MED ORDER — GABAPENTIN 300 MG PO CAPS
300.0000 mg | ORAL_CAPSULE | ORAL | Status: AC
  Administered 2018-10-19: 07:00:00 300 mg via ORAL
  Filled 2018-10-19: qty 1

## 2018-10-19 MED ORDER — FENTANYL CITRATE (PF) 100 MCG/2ML IJ SOLN
INTRAMUSCULAR | Status: AC
  Administered 2018-10-19: 07:00:00 50 ug via INTRAVENOUS
  Filled 2018-10-19: qty 2

## 2018-10-19 MED ORDER — ONDANSETRON HCL 4 MG/2ML IJ SOLN: 4.0000 mg | Freq: Four times a day (QID) | INTRAMUSCULAR | Status: DC | PRN

## 2018-10-19 MED ORDER — DEXAMETHASONE SODIUM PHOSPHATE 10 MG/ML IJ SOLN
INTRAMUSCULAR | Status: DC | PRN
  Administered 2018-10-19: 10 mg via INTRAVENOUS

## 2018-10-19 MED ORDER — KCL IN DEXTROSE-NACL 20-5-0.9 MEQ/L-%-% IV SOLN
INTRAVENOUS | Status: DC
  Administered 2018-10-19 – 2018-10-21 (×4): via INTRAVENOUS
  Filled 2018-10-19 (×4): qty 1000

## 2018-10-19 MED ORDER — FENTANYL CITRATE (PF) 100 MCG/2ML IJ SOLN
50.0000 ug | Freq: Once | INTRAMUSCULAR | Status: AC
  Administered 2018-10-19: 07:00:00 50 ug via INTRAVENOUS

## 2018-10-19 MED ORDER — SENNA 8.6 MG PO TABS: 1.0000 | ORAL_TABLET | Freq: Two times a day (BID) | ORAL | Status: DC

## 2018-10-19 MED ORDER — PROPOFOL 10 MG/ML IV BOLUS
INTRAVENOUS | Status: AC
  Filled 2018-10-19: qty 40

## 2018-10-19 MED ORDER — ONDANSETRON HCL 4 MG/2ML IJ SOLN
INTRAMUSCULAR | Status: DC | PRN
  Administered 2018-10-19: 4 mg via INTRAVENOUS

## 2018-10-19 MED ORDER — ONDANSETRON HCL 4 MG/2ML IJ SOLN
INTRAMUSCULAR | Status: AC
  Filled 2018-10-19: qty 2

## 2018-10-19 MED ORDER — LIDOCAINE 2% (20 MG/ML) 5 ML SYRINGE
INTRAMUSCULAR | Status: DC | PRN
  Administered 2018-10-19: 60 mg via INTRAVENOUS

## 2018-10-19 MED ORDER — MIDAZOLAM HCL 2 MG/2ML IJ SOLN
INTRAMUSCULAR | Status: AC
  Administered 2018-10-19: 07:00:00 2 mg via INTRAVENOUS
  Filled 2018-10-19: qty 2

## 2018-10-19 MED ORDER — HYDROCODONE-ACETAMINOPHEN 5-325 MG PO TABS
1.0000 | ORAL_TABLET | ORAL | Status: DC | PRN
  Administered 2018-10-19: 2 via ORAL

## 2018-10-19 MED ORDER — CEFAZOLIN SODIUM 1 G IJ SOLR
INTRAMUSCULAR | Status: AC
  Filled 2018-10-19: qty 20

## 2018-10-19 MED ORDER — SCOPOLAMINE 1 MG/3DAYS TD PT72
1.0000 | MEDICATED_PATCH | Freq: Once | TRANSDERMAL | Status: DC
  Administered 2018-10-19: 1.5 mg via TRANSDERMAL
  Filled 2018-10-19: qty 1

## 2018-10-19 MED ORDER — BISACODYL 10 MG RE SUPP: 10.0000 mg | Freq: Every day | RECTAL | Status: DC | PRN

## 2018-10-19 MED ORDER — SUGAMMADEX SODIUM 200 MG/2ML IV SOLN
INTRAVENOUS | Status: DC | PRN
  Administered 2018-10-19: 200 mg via INTRAVENOUS

## 2018-10-19 MED ORDER — KCL IN DEXTROSE-NACL 20-5-0.45 MEQ/L-%-% IV SOLN: INTRAVENOUS | Status: DC

## 2018-10-19 MED ORDER — CELECOXIB 200 MG PO CAPS
200.0000 mg | ORAL_CAPSULE | ORAL | Status: AC
  Administered 2018-10-19: 07:00:00 200 mg via ORAL
  Filled 2018-10-19: qty 1

## 2018-10-19 MED ORDER — WHITE PETROLATUM EX OINT
TOPICAL_OINTMENT | CUTANEOUS | Status: AC
  Administered 2018-10-19: 0.2
  Filled 2018-10-19: qty 28.35

## 2018-10-19 MED ORDER — MORPHINE SULFATE (PF) 2 MG/ML IV SOLN
2.0000 mg | INTRAVENOUS | Status: DC | PRN
  Administered 2018-10-19 – 2018-10-20 (×2): 2 mg via INTRAVENOUS
  Filled 2018-10-19 (×2): qty 1

## 2018-10-19 MED ORDER — MIDAZOLAM HCL 2 MG/2ML IJ SOLN
2.0000 mg | Freq: Once | INTRAMUSCULAR | Status: AC
  Administered 2018-10-19: 07:00:00 2 mg via INTRAVENOUS

## 2018-10-19 MED ORDER — ONDANSETRON 4 MG PO TBDP: 4.0000 mg | ORAL_TABLET | Freq: Four times a day (QID) | ORAL | Status: DC | PRN

## 2018-10-19 MED ORDER — SENNA 8.6 MG PO TABS
1.0000 | ORAL_TABLET | Freq: Two times a day (BID) | ORAL | Status: DC
  Administered 2018-10-19 – 2018-10-21 (×5): 8.6 mg via ORAL
  Filled 2018-10-19 (×5): qty 1

## 2018-10-19 MED ORDER — PHENYLEPHRINE 40 MCG/ML (10ML) SYRINGE FOR IV PUSH (FOR BLOOD PRESSURE SUPPORT)
PREFILLED_SYRINGE | INTRAVENOUS | Status: AC
  Filled 2018-10-19: qty 10

## 2018-10-19 MED ORDER — HYDROCODONE-ACETAMINOPHEN 5-325 MG PO TABS
ORAL_TABLET | ORAL | Status: AC
  Filled 2018-10-19: qty 2

## 2018-10-19 MED ORDER — SODIUM CHLORIDE 0.9 % IV SOLN
INTRAVENOUS | Status: DC | PRN
  Administered 2018-10-19: 500 mL

## 2018-10-19 MED ORDER — PHENYLEPHRINE 40 MCG/ML (10ML) SYRINGE FOR IV PUSH (FOR BLOOD PRESSURE SUPPORT)
PREFILLED_SYRINGE | INTRAVENOUS | Status: DC | PRN
  Administered 2018-10-19 (×2): 40 ug via INTRAVENOUS

## 2018-10-19 MED ORDER — PROPOFOL 10 MG/ML IV BOLUS
INTRAVENOUS | Status: DC | PRN
  Administered 2018-10-19: 100 mg via INTRAVENOUS

## 2018-10-19 MED ORDER — CEFAZOLIN SODIUM-DEXTROSE 2-4 GM/100ML-% IV SOLN
2.0000 g | INTRAVENOUS | Status: AC
  Administered 2018-10-19 (×2): 2 g via INTRAVENOUS
  Filled 2018-10-19: qty 100

## 2018-10-19 MED ORDER — PROMETHAZINE HCL 25 MG/ML IJ SOLN: 6.2500 mg | INTRAMUSCULAR | Status: DC | PRN

## 2018-10-19 SURGICAL SUPPLY — 69 items
ADH SKN CLS APL DERMABOND .7 (GAUZE/BANDAGES/DRESSINGS) ×8
APL PRP STRL LF DISP 70% ISPRP (MISCELLANEOUS) ×2
BAG DECANTER FOR FLEXI CONT (MISCELLANEOUS) ×4 IMPLANT
BINDER BREAST LRG (GAUZE/BANDAGES/DRESSINGS) ×2 IMPLANT
BINDER BREAST XLRG (GAUZE/BANDAGES/DRESSINGS) IMPLANT
BIOPATCH RED 1 DISK 7.0 (GAUZE/BANDAGES/DRESSINGS) ×6 IMPLANT
BIOPATCH RED 1IN DISK 7.0MM (GAUZE/BANDAGES/DRESSINGS) ×2
CANISTER SUCT 3000ML PPV (MISCELLANEOUS) ×4 IMPLANT
CHLORAPREP W/TINT 26 (MISCELLANEOUS) ×4 IMPLANT
CONT SPEC 4OZ CLIKSEAL STRL BL (MISCELLANEOUS) ×4 IMPLANT
COVER SURGICAL LIGHT HANDLE (MISCELLANEOUS) ×4 IMPLANT
COVER WAND RF STERILE (DRAPES) ×2 IMPLANT
DERMABOND ADVANCED (GAUZE/BANDAGES/DRESSINGS) ×8
DERMABOND ADVANCED .7 DNX12 (GAUZE/BANDAGES/DRESSINGS) ×2 IMPLANT
DEVICE DISSECT PLASMABLAD 3.0S (MISCELLANEOUS) IMPLANT
DRAIN CHANNEL 19F RND (DRAIN) ×6 IMPLANT
DRAPE HALF SHEET 40X57 (DRAPES) ×4 IMPLANT
DRAPE ORTHO SPLIT 77X108 STRL (DRAPES) ×8
DRAPE SURG 17X23 STRL (DRAPES) ×16 IMPLANT
DRAPE SURG ORHT 6 SPLT 77X108 (DRAPES) ×4 IMPLANT
DRAPE WARM FLUID 44X44 (DRAPES) ×4 IMPLANT
DRSG TEGADERM 4X4.75 (GAUZE/BANDAGES/DRESSINGS) ×2 IMPLANT
ELECT BLADE 4.0 EZ CLEAN MEGAD (MISCELLANEOUS) ×4
ELECT BLADE 6.5 EXT (BLADE) ×2 IMPLANT
ELECT CAUTERY BLADE 6.4 (BLADE) ×2 IMPLANT
ELECT REM PT RETURN 9FT ADLT (ELECTROSURGICAL) ×4
ELECTRODE BLDE 4.0 EZ CLN MEGD (MISCELLANEOUS) IMPLANT
ELECTRODE REM PT RTRN 9FT ADLT (ELECTROSURGICAL) ×2 IMPLANT
EVACUATOR SILICONE 100CC (DRAIN) ×6 IMPLANT
GAUZE SPONGE 4X4 12PLY STRL (GAUZE/BANDAGES/DRESSINGS) ×2 IMPLANT
GAUZE SPONGE 4X4 12PLY STRL LF (GAUZE/BANDAGES/DRESSINGS) ×4 IMPLANT
GLOVE BIO SURGEON STRL SZ 6.5 (GLOVE) ×8 IMPLANT
GLOVE BIO SURGEONS STRL SZ 6.5 (GLOVE) ×4
GOWN STRL REUS W/ TWL LRG LVL3 (GOWN DISPOSABLE) ×4 IMPLANT
GOWN STRL REUS W/TWL LRG LVL3 (GOWN DISPOSABLE) ×20
GRAFT FLEX HD 6X16 PLIABLE (Tissue) ×4 IMPLANT
IMPL BREAST 300CC (Breast) IMPLANT
IMPLANT BREAST 300CC (Breast) ×8 IMPLANT
KIT BASIN OR (CUSTOM PROCEDURE TRAY) ×4 IMPLANT
KIT MARKER MARGIN INK (KITS) ×2 IMPLANT
KIT TURNOVER KIT B (KITS) ×4 IMPLANT
LIGHT WAVEGUIDE WIDE FLAT (MISCELLANEOUS) ×2 IMPLANT
MARKER SKIN DUAL TIP RULER LAB (MISCELLANEOUS) ×2 IMPLANT
NS IRRIG 1000ML POUR BTL (IV SOLUTION) ×8 IMPLANT
PACK GENERAL/GYN (CUSTOM PROCEDURE TRAY) ×6 IMPLANT
PAD ABD 7.5X8 STRL (GAUZE/BANDAGES/DRESSINGS) ×2 IMPLANT
PAD ABD 8X10 STRL (GAUZE/BANDAGES/DRESSINGS) ×8 IMPLANT
PAD ARMBOARD 7.5X6 YLW CONV (MISCELLANEOUS) ×6 IMPLANT
PIN SAFETY STERILE (MISCELLANEOUS) ×4 IMPLANT
PLASMABLADE 3.0S (MISCELLANEOUS) ×4
SET ASEPTIC TRANSFER (MISCELLANEOUS) ×2 IMPLANT
SET WALTER ACTIVATION W/DRAPE (SET/KITS/TRAYS/PACK) IMPLANT
SET WALTER ACTIVATION W/DRAPE 133774 ×4 IMPLANT
SLEEVE SUCTION CATH 165 (SLEEVE) ×2 IMPLANT
SUT MNCRL AB 4-0 PS2 18 (SUTURE) ×8 IMPLANT
SUT MON AB 3-0 SH 27 (SUTURE) ×12
SUT MON AB 3-0 SH27 (SUTURE) ×4 IMPLANT
SUT MON AB 5-0 PS2 18 (SUTURE) ×8 IMPLANT
SUT PDS AB 2-0 CT1 27 (SUTURE) ×18 IMPLANT
SUT SILK 3 0 SH 30 (SUTURE) ×4 IMPLANT
SUT SILK 4 0 PS 2 (SUTURE) ×2 IMPLANT
SUT VIC AB 3-0 SH 18 (SUTURE) ×4 IMPLANT
SUT VIC AB 3-0 SH 27 (SUTURE) ×12
SUT VIC AB 3-0 SH 27XBRD (SUTURE) IMPLANT
TOWEL GREEN STERILE FF (TOWEL DISPOSABLE) ×4 IMPLANT
TOWEL OR 17X26 10 PK STRL BLUE (TOWEL DISPOSABLE) ×4 IMPLANT
TRAY FOLEY MTR SLVR 14FR STAT (SET/KITS/TRAYS/PACK) ×2 IMPLANT
TUBE CONNECTING 12'X1/4 (SUCTIONS) ×1
TUBE CONNECTING 12X1/4 (SUCTIONS) ×1 IMPLANT

## 2018-10-19 NOTE — Anesthesia Procedure Notes (Signed)
Procedure Name: Intubation Date/Time: 10/19/2018 8:13 AM Performed by: Genelle Bal, CRNA Pre-anesthesia Checklist: Patient identified, Emergency Drugs available, Suction available and Patient being monitored Patient Re-evaluated:Patient Re-evaluated prior to induction Oxygen Delivery Method: Circle system utilized Preoxygenation: Pre-oxygenation with 100% oxygen Induction Type: IV induction Ventilation: Mask ventilation without difficulty Laryngoscope Size: Miller and 2 Grade View: Grade I Tube type: Oral Tube size: 7.0 mm Number of attempts: 1 Airway Equipment and Method: Stylet and Oral airway Placement Confirmation: ETT inserted through vocal cords under direct vision,  positive ETCO2 and breath sounds checked- equal and bilateral Secured at: 20 cm Tube secured with: Tape Dental Injury: Teeth and Oropharynx as per pre-operative assessment

## 2018-10-19 NOTE — Progress Notes (Signed)
Attempted report at 60 and at 60. No one was available to take report.

## 2018-10-19 NOTE — Anesthesia Postprocedure Evaluation (Signed)
Anesthesia Post Note  Patient: Antia Rahal  Procedure(s) Performed: BILATERAL NIPPLE SPARING MASTECTOMY (Bilateral Breast) BREAST RECONSTRUCTION WITH PLACEMENT OF TISSUE EXPANDER AND FLEX HD (ACELLULAR HYDRATED DERMIS) (Bilateral )     Patient location during evaluation: PACU Anesthesia Type: General Level of consciousness: awake and alert Pain management: pain level controlled Vital Signs Assessment: post-procedure vital signs reviewed and stable Respiratory status: spontaneous breathing, nonlabored ventilation, respiratory function stable and patient connected to nasal cannula oxygen Cardiovascular status: blood pressure returned to baseline and stable Postop Assessment: no apparent nausea or vomiting Anesthetic complications: no    Last Vitals:  Vitals:   10/19/18 1541 10/19/18 2002  BP: (!) 99/57 (!) 88/48  Pulse: 71 75  Resp: 14 18  Temp: 36.8 C 36.7 C  SpO2: 100% 98%    Last Pain:  Vitals:   10/19/18 2002  TempSrc: Oral  PainSc:                  Catalina Gravel

## 2018-10-19 NOTE — Progress Notes (Signed)
Received pt from PACU, s/p bilateral nipple sparing mastectomy. Pt is alert, oriented and stable. Oriented to room and placed call bell and phone within reach. Will continue to monitor.

## 2018-10-19 NOTE — Progress Notes (Signed)
Attempted report at 1509. Nurse was not available.

## 2018-10-19 NOTE — Op Note (Signed)
Op report    DATE OF OPERATION:  10/19/2018  LOCATION: [facility] Main Operating Room  SURGICAL DIVISION: Plastic Surgery  PREOPERATIVE DIAGNOSES:  1. Breast disorder.    POSTOPERATIVE DIAGNOSES:  1. Breast disorder.  PROCEDURE:  1. Bilateral immediate breast reconstruction with placement of Acellular Dermal Matrix and tissue expanders.  SURGEON: Lathan Gieselman [place] Hikari Tripp, DO  ASSISTANT: [place], RNFA  ANESTHESIA:  General.   COMPLICATIONS: None.   IMPLANTS: Left - Mentor 300 cc. Ref #[id number].  Serial Number [id number], 50 cc of injectable saline placed in the expander. Right - Mentor 300 cc. Ref #[id number].  Serial Number [id number], 50 cc of injectable saline placed in the expander. Acellular Dermal Matrix 6 x 16 cm two  INDICATIONS FOR PROCEDURE:  The patient, Bethany [name], is a 43 y.o. female born on 22-Sep-1975, is here for  immediate first stage breast reconstruction with placement of bilateral tissue expander and Acellular dermal matrix. MRN: [id number]  CONSENT:  Informed consent was obtained directly from the patient. Risks, benefits and alternatives were fully discussed. Specific risks including but not limited to bleeding, infection, hematoma, seroma, scarring, pain, implant infection, implant extrusion, capsular contracture, asymmetry, wound healing problems, and need for further surgery were all discussed. The patient did have an ample opportunity to have her questions answered to her satisfaction.   DESCRIPTION OF PROCEDURE:  The patient was taken to the operating room by the general surgery team. SCDs were placed and IV antibiotics were given. The patient's chest was prepped and draped in a sterile fashion. A time out was performed and the implants to be used were identified.  Bilateral mastectomies were performed.  Once the general surgery team had completed their portion of the case the patient was rendered to the plastic and reconstructive surgery  team.  Right:  The pectoralis major muscle was lifted from the chest wall with release of the lateral edge and lateral inframammary fold.  The pocket was irrigated with antibiotic solution and hemostasis was achieved with electrocautery.  The ADM was then prepared according to the manufacture guidelines and slits placed to help with postoperative fluid management.  The ADM was then sutured to the inferior and lateral edge of the inframammary fold with 2-0 PDS starting with an interrupted stitch and then a running stitch.  The lateral portion was sutured to with interrupted sutures after the expander was placed.  There was a 3 x 4 cm area of muscle that was weak at the inferior pole.  This was reinforced with the flex HD as a patch.  It was secured with the 3-0 Vicryl.  The expander was prepared according to the manufacture guidelines, the air evacuated and then it was placed under the ADM and pectoralis major muscle.  The inferior and lateral tabs were used to secure the expander to the chest wall with 2-0 PDS.  The drain was placed at the inframammary fold over the ADM and secured to the skin with 3-0 Silk. The deep layers were closed with 3-0 Monocryl followed by 4-0 Monocryl.  The skin was closed with 5-0 Monocryl and then dermabond was applied.    Left:The pectoralis major muscle was lifted from the chest wall with release of the lateral edge and lateral inframammary fold.  The pocket was irrigated with antibiotic solution and hemostasis was achieved with electrocautery.  The ADM was then prepared according to the manufacture guidelines and slits placed to help with postoperative fluid management.  The ADM was then sutured to the  inferior and lateral edge of the inframammary fold with 2-0 PDS starting with an interrupted stitch and then a running stitch.  The lateral portion was sutured to with interrupted sutures after the expander was placed.  The expander was prepared according to the manufacture  guidelines, the air evacuated and then it was placed under the ADM and pectoralis major muscle.  The inferior and lateral tabs were used to secure the expander to the chest wall with 2-0 PDS.  The drain was placed at the inframammary fold over the ADM and secured to the skin with 3-0 Silk. The deep layers were closed with 3-0 Monocryl followed by 4-0 Monocryl.  The skin was closed with 5-0 Monocryl and then dermabond was applied.   The ABDs and breast binder were placed.  The patient tolerated the procedure well and there were no complications.  The patient was allowed to wake from anesthesia and taken to the recovery room in satisfactory condition.

## 2018-10-19 NOTE — Interval H&P Note (Signed)
History and Physical Interval Note:  10/19/2018 7:16 AM  Bethany [name]  has presented today for surgery, with the diagnosis of bilateral fibrocystic breast disease.  The various methods of treatment have been discussed with the patient and family. After consideration of risks, benefits and other options for treatment, the patient has consented to  Procedure(s): BILATERAL NIPPLE SPARING MASTECTOMY (Bilateral) BREAST RECONSTRUCTION WITH PLACEMENT OF TISSUE EXPANDER AND FLEX HD (ACELLULAR HYDRATED DERMIS) (Bilateral) as a surgical intervention.  The patient's history has been reviewed, patient examined, no change in status, stable for surgery.  I have reviewed the patient's chart and labs.  Questions were answered to the patient's satisfaction.     Loel Lofty Iverna Hammac

## 2018-10-19 NOTE — Op Note (Signed)
10/19/2018  12:02 PM  PATIENT:  Bethany [name]  43 y.o. female  PRE-OPERATIVE DIAGNOSIS:  bilateral fibrocystic breast disease  POST-OPERATIVE DIAGNOSIS:  bilateral fibrocystic breast disease  PROCEDURE:  Procedure(s): BILATERAL NIPPLE SPARING MASTECTOMY (Bilateral)  SURGEON:  Surgeon(s) and Role: Panel 1:    * Jovita Kussmaul, MD - Primary  PHYSICIAN ASSISTANT:   ASSISTANTS: none   ANESTHESIA:   general  EBL:  20 mL   BLOOD ADMINISTERED:none  DRAINS: none   LOCAL MEDICATIONS USED:  NONE  SPECIMEN:  Source of Specimen:  bilateral breast tissue  DISPOSITION OF SPECIMEN:  PATHOLOGY  COUNTS:  YES  TOURNIQUET:  * No tourniquets in log *  DICTATION: .Dragon Dictation   After informed consent was obtained the patient was brought to the operating room and placed in the supine position on the operating table.  After adequate induction of general anesthesia the patient's bilateral chest, breast, and axillary areas were prepped with ChloraPrep, allowed to dry, and draped in usual sterile manner.  An appropriate timeout was performed.  Attention was first turned to the left breast.  An inframammary fold incision was made with a 10 blade knife.  The incision was carried through the skin and subcutaneous tissue sharply with the plasma blade.  The dissection was carried through this incision all the way to the chest wall.  Next the breast was separated from the pectoralis muscle with the pectoralis fascia throughout the extent of the length of the breast.  Next dissection was carried anteriorly between the breast tissue and the subcutaneous fat.  As of the nipple and areole a were dissected free a small piece of tissue from behind the nipple was removed and sent to pathology for frozen section.  This was negative.  The rest of the dissection was carried until the dissection reached the superior, medial, and lateral edges of the breast.  Once this was accomplished the breast was able to be  removed from the patient.  The location of the nipple was marked with a stitch.  The breast was then oriented with the appropriate paint colors.  This breast tissue was then sent to pathology for further evaluation.  The cavity was packed with a moistened lap sponge.  Hemostasis was achieved with the plasma blade.  Next attention was turned to the right breast.  A similar inframammary fold incision was made with a 10 blade knife.  The incision was carried through the skin and subcutaneous tissue sharply with the plasma blade.  The dissection was carried all the way to the chest wall.  Next the dissection was carried superiorly until the breast was completely separated from the pectoralis muscle with the pectoralis fascia.  The dissection was then carried superiorly and anteriorly between the breast tissue and the subcutaneous fat.  As the dissection freed the nipple and areole a a small piece of tissue from behind the nipple was removed and sent for frozen section.  This tissue was negative.  The dissection was then carried out until this dissection reached the superior, medial, and lateral edges of the breast.  Once this was accomplished the breast tissue was able to be removed from the patient.  The location of the nipple was marked with a stitch.  The breast was oriented with the appropriate paint colors.  This breast tissue was then sent to pathology for further evaluation.  Hemostasis was achieved using the plasma blade.  The cavity was then packed with a moistened lap sponge.  The  nipple and skin all appeared viable.  At this point the operation was turned over to Dr. Marla Roe for the reconstruction.  Her portion will be dictated separately.  At this point all needle sponge and instrument counts were correct.  The patient had tolerated the surgery well.  PLAN OF CARE: Admit for overnight observation  PATIENT DISPOSITION:  PACU - hemodynamically stable.   Delay start of Pharmacological VTE agent  (>24hrs) due to surgical blood loss or risk of bleeding: no

## 2018-10-19 NOTE — Interval H&P Note (Signed)
History and Physical Interval Note:  10/19/2018 7:50 AM  Bethany [name]  has presented today for surgery, with the diagnosis of bilateral fibrocystic breast disease.  The various methods of treatment have been discussed with the patient and family. After consideration of risks, benefits and other options for treatment, the patient has consented to  Procedure(s): BILATERAL NIPPLE SPARING MASTECTOMY (Bilateral) BREAST RECONSTRUCTION WITH PLACEMENT OF TISSUE EXPANDER AND FLEX HD (ACELLULAR HYDRATED DERMIS) (Bilateral) as a surgical intervention.  The patient's history has been reviewed, patient examined, no change in status, stable for surgery.  I have reviewed the patient's chart and labs.  Questions were answered to the patient's satisfaction.     [name]

## 2018-10-19 NOTE — Anesthesia Procedure Notes (Signed)
Anesthesia Regional Block: Pectoralis block   Pre-Anesthetic Checklist: ,, timeout performed, Correct Patient, Correct Site, Correct Laterality, Correct Procedure, Correct Position, site marked, Risks and benefits discussed,  Surgical consent,  Pre-op evaluation,  At surgeon's request and post-op pain management  Laterality: Right  Prep: chloraprep       Needles:  Injection technique: Single-shot  Needle Type: Echogenic Needle     Needle Length: 9cm  Needle Gauge: 21     Additional Needles:   Procedures:,,,, ultrasound used (permanent image in chart),,,,  Narrative:  Start time: 10/19/2018 7:21 AM End time: 10/19/2018 7:26 AM Injection made incrementally with aspirations every 5 mL.  Performed by: Personally  Anesthesiologist: Catalina Gravel, MD  Additional Notes: No pain on injection. No increased resistance to injection. Injection made in 5cc increments.  Good needle visualization.  Patient tolerated procedure well.

## 2018-10-19 NOTE — Anesthesia Procedure Notes (Signed)
Anesthesia Regional Block: Pectoralis block   Pre-Anesthetic Checklist: ,, timeout performed, Correct Patient, Correct Site, Correct Laterality, Correct Procedure, Correct Position, site marked, Risks and benefits discussed,  Surgical consent,  Pre-op evaluation,  At surgeon's request and post-op pain management  Laterality: Left  Prep: chloraprep       Needles:  Injection technique: Single-shot  Needle Type: Echogenic Needle     Needle Length: 9cm  Needle Gauge: 21     Additional Needles:   Procedures:,,,, ultrasound used (permanent image in chart),,,,  Narrative:  Start time: 10/19/2018 7:27 AM End time: 10/19/2018 7:32 AM Injection made incrementally with aspirations every 5 mL.  Performed by: Personally  Anesthesiologist: Catalina Gravel, MD  Additional Notes: No pain on injection. No increased resistance to injection. Injection made in 5cc increments.  Good needle visualization.  Patient tolerated procedure well.

## 2018-10-19 NOTE — Transfer of Care (Signed)
Immediate Anesthesia Transfer of Care Note  Patient: Bethany [name]  Procedure(s) Performed: BILATERAL NIPPLE SPARING MASTECTOMY (Bilateral Breast) BREAST RECONSTRUCTION WITH PLACEMENT OF TISSUE EXPANDER AND FLEX HD (ACELLULAR HYDRATED DERMIS) (Bilateral )  Patient Location: PACU  Anesthesia Type:GA combined with regional for post-op pain  Level of Consciousness: awake, alert  and oriented  Airway & Oxygen Therapy: Patient Spontanous Breathing and Patient connected to face mask oxygen  Post-op Assessment: Report given to RN and Post -op Vital signs reviewed and stable  Post vital signs: Reviewed and stable  Last Vitals:  Vitals Value Taken Time  BP 114/71 10/19/18 1312  Temp    Pulse 74 10/19/18 1314  Resp 17 10/19/18 1314  SpO2 100 % 10/19/18 1314  Vitals shown include unvalidated device data.  Last Pain:  Vitals:   10/19/18 0700  TempSrc:   PainSc: 0-No pain         Complications: No apparent anesthesia complications

## 2018-10-19 NOTE — H&P (Signed)
Bethany [name]  Location: [facility] Patient #: [id number] DOB: [birth year] Married / Language: English / Race: Black or African American Female   History of Present Illness The patient is a 43 year old female who presents with a complaint of Breast pain. We are asked to see the patient in consultation by Dr. Jetta Lout to evaluate her for a breast mass. The patient is a 43 year old black female who presents with bilateral breast pain which is worse on the left. The pain has been present for more than 2 years. She experiences pain on an everyday basis. She feels nodules in both breasts that are exquisitely tender. She does not smoke. She does have a family history of breast cancer in a maternal aunt. She did have a benign lumpectomy performed about 7 years ago on the right side. Her last mammogram was in July that showed no evidence of malignancy but her breast density was categorized as a D which is extremely dense. This makes their ability to detect a cancer much more difficult.   Past Surgical History  Breast Biopsy  Right. Cesarean Section - 1  Oral Surgery   Diagnostic Studies History  Colonoscopy  never Mammogram  within last year Pap Smear  1-5 years ago  Allergies  Codeine Phosphate *ANALGESICS - OPIOID*  Hives. LaMICtal *ANTICONVULSANTS*  Rash.  Medication History  Tylenol (500MG  Capsule, Oral) Active. Abilify (20MG  Tablet, Oral) Active. Vistaril (25MG  Capsule, Oral) Active. Medications Reconciled  Social History  Alcohol use  Remotely quit alcohol use. Caffeine use  Carbonated beverages, Coffee, Tea. No drug use  Tobacco use  Never smoker.  Family History  Breast Cancer  Family Members In General. Depression  Father. Heart Disease  Father. Heart disease in female family member before age 60  Hypertension  Father.  Pregnancy / Birth History Age at menarche  77 years. Gravida  3 Length (months) of breastfeeding   >24 Maternal age  70-20 Para  3  Other Problems Anxiety Disorder  Depression  Lump In Breast  Thyroid Disease     Review of Systems General Present- Appetite Loss. Not Present- Chills, Fatigue, Fever, Night Sweats, Weight Gain and Weight Loss. Skin Not Present- Change in Wart/Mole, Dryness, Hives, Jaundice, New Lesions, Non-Healing Wounds, Rash and Ulcer. HEENT Present- Wears glasses/contact lenses. Not Present- Earache, Hearing Loss, Hoarseness, Nose Bleed, Oral Ulcers, Ringing in the Ears, Seasonal Allergies, Sinus Pain, Sore Throat, Visual Disturbances and Yellow Eyes. Respiratory Not Present- Bloody sputum, Chronic Cough, Difficulty Breathing, Snoring and Wheezing. Breast Present- Breast Mass and Breast Pain. Not Present- Nipple Discharge and Skin Changes. Cardiovascular Not Present- Chest Pain, Difficulty Breathing Lying Down, Leg Cramps, Palpitations, Rapid Heart Rate, Shortness of Breath and Swelling of Extremities. Gastrointestinal Not Present- Abdominal Pain, Bloating, Bloody Stool, Change in Bowel Habits, Chronic diarrhea, Constipation, Difficulty Swallowing, Excessive gas, Gets full quickly at meals, Hemorrhoids, Indigestion, Nausea, Rectal Pain and Vomiting. Female Genitourinary Not Present- Frequency, Nocturia, Painful Urination, Pelvic Pain and Urgency. Musculoskeletal Not Present- Back Pain, Joint Pain, Joint Stiffness, Muscle Pain, Muscle Weakness and Swelling of Extremities. Neurological Not Present- Decreased Memory, Fainting, Headaches, Numbness, Seizures, Tingling, Tremor, Trouble walking and Weakness. Psychiatric Present- Anxiety, Bipolar and Depression. Not Present- Change in Sleep Pattern, Fearful and Frequent crying. Endocrine Not Present- Cold Intolerance, Excessive Hunger, Hair Changes, Heat Intolerance, Hot flashes and New Diabetes. Hematology Not Present- Blood Thinners, Easy Bruising, Excessive bleeding, Gland problems, HIV and Persistent  Infections.  Vitals Weight: 130.5 lb Height: 64in Body Surface Area:  1.63 m Body Mass Index: 22.4 kg/m  BP: 112/72(Sitting, Left Arm, Standard)       Physical Exam  General Mental Status-Alert. General Appearance-Consistent with stated age. Hydration-Well hydrated. Voice-Normal.  Head and Neck Head-normocephalic, atraumatic with no lesions or palpable masses. Trachea-midline. Thyroid Gland Characteristics - normal size and consistency.  Eye Eyeball - Bilateral-Extraocular movements intact. Sclera/Conjunctiva - Bilateral-No scleral icterus.  Chest and Lung Exam Chest and lung exam reveals -quiet, even and easy respiratory effort with no use of accessory muscles and on auscultation, normal breath sounds, no adventitious sounds and normal vocal resonance. Inspection Chest Wall - Normal. Back - normal.  Breast Note: There is very dense nodular symmetric breast tissue bilaterally. There is no palpable axillary, supraclavicular, or cervical lymphadenopathy.   Cardiovascular Cardiovascular examination reveals -normal heart sounds, regular rate and rhythm with no murmurs and normal pedal pulses bilaterally.  Abdomen Inspection Inspection of the abdomen reveals - No Hernias. Skin - Scar - no surgical scars. Palpation/Percussion Palpation and Percussion of the abdomen reveal - Soft, Non Tender, No Rebound tenderness, No Rigidity (guarding) and No hepatosplenomegaly. Auscultation Auscultation of the abdomen reveals - Bowel sounds normal.  Neurologic Neurologic evaluation reveals -alert and oriented x 3 with no impairment of recent or remote memory. Mental Status-Normal.  Musculoskeletal Normal Exam - Left-Upper Extremity Strength Normal and Lower Extremity Strength Normal. Normal Exam - Right-Upper Extremity Strength Normal and Lower Extremity Strength Normal.  Lymphatic Head & Neck  General Head & Neck Lymphatics: Bilateral -  Description - Normal. Axillary  General Axillary Region: Bilateral - Description - Normal. Tenderness - Non Tender. Femoral & Inguinal  Generalized Femoral & Inguinal Lymphatics: Bilateral - Description - Normal. Tenderness - Non Tender.    Assessment & Plan   BREAST PAIN IN FEMALE (N64.4) Impression: The patient appears to have significant fibrocystic disease with pain bilaterally. Her breast density is categorized as a D which is extremely dense. She also has a history of breast cancer in a maternal aunt. These 2 factors put her lifetime risk of breast cancer at about 23%. Because of the amount of pain she is experiencing on a daily basis she would like to consider bilateral mastectomies. I think this is a very reasonable thing to consider giving her history, density, and pain level. She would likely be a good candidate for nipple sparing mastectomy. I have discussed with her in detail the risks and benefits of the operation as well as some of the technical aspects and she understands and wishes to proceed. We will refer her to plastic surgery. I will get another set of mammograms to make sure that no abnormalities were seen prior to surgery. We will then coordinate with plastic surgery for the operation. She will continue to also work with physical therapy on range of motion of her left shoulder which is limited at baseline.  Current Plans Referred to Surgery - Plastic, for evaluation and follow up (Plastic Surgery). Routine. DIAGNOSTIC BILATERAL MAMMOGRAM ([id number])

## 2018-10-20 ENCOUNTER — Encounter (HOSPITAL_COMMUNITY): Payer: Self-pay | Admitting: General Surgery

## 2018-10-20 DIAGNOSIS — N6012 Diffuse cystic mastopathy of left breast: Secondary | ICD-10-CM | POA: Diagnosis not present

## 2018-10-20 LAB — HIV ANTIBODY (ROUTINE TESTING W REFLEX): HIV Screen 4th Generation wRfx: NONREACTIVE

## 2018-10-20 MED ORDER — METHOCARBAMOL 750 MG PO TABS: 750.0000 mg | ORAL_TABLET | Freq: Four times a day (QID) | ORAL | 2 refills | Status: DC | PRN

## 2018-10-20 NOTE — Progress Notes (Signed)
1 Day Post-Op   Subjective/Chief Complaint: Complains of some soreness and lightheadedness   Objective: Vital signs in last 24 hours: Temp:  [97.7 F (36.5 C)-98.5 F (36.9 C)] 98.5 F (36.9 C) (06/30 0537) Pulse Rate:  [64-86] 64 (06/30 0742) Resp:  [10-18] 18 (06/30 0537) BP: (88-114)/(48-72) 94/56 (06/30 0742) SpO2:  [94 %-100 %] 100 % (06/30 0742) Weight:  [66.9 kg] 66.9 kg (06/29 1541) Last BM Date: (PTA)  Intake/Output from previous day: 06/29 0701 - 06/30 0700 In: 4301.1 [P.O.:720; I.V.:3285.9; IV Piggyback:295.3] Out: 1046 [Urine:790; Drains:206; Blood:50] Intake/Output this shift: Total I/O In: 360 [P.O.:360] Out: 35 [Drains:35]  General appearance: alert and cooperative Resp: clear to auscultation bilaterally Chest wall: skin flaps look good Cardio: regular rate and rhythm GI: soft, non-tender; bowel sounds normal; no masses,  no organomegaly  Lab Results:  No results for input(s): WBC, HGB, HCT, PLT in the last 72 hours. BMET No results for input(s): NA, K, CL, CO2, GLUCOSE, BUN, CREATININE, CALCIUM in the last 72 hours. PT/INR No results for input(s): LABPROT, INR in the last 72 hours. ABG No results for input(s): PHART, HCO3 in the last 72 hours.  Invalid input(s): PCO2, PO2  Studies/Results: Dg Chest 1 View  Result Date: 10/19/2018 CLINICAL DATA:  Instrument count, needle count discrepancy status post bilateral nipple sparing mastectomy. EXAM: CHEST  1 VIEW COMPARISON:  None. FINDINGS: Endotracheal tube terminates 4.9 cm above the carina. Trachea is midline. Heart size normal. Lungs are somewhat low in volume but clear. No pleural fluid. No unexpected radiopaque foreign body. Patient is status post bilateral mastectomy with surgical drains in place. Subcutaneous air is seen along the lateral chest wall. IMPRESSION: No radiopaque foreign body. Electronically Signed   By: Lorin Picket M.D.   On: 10/19/2018 13:08    Anti-infectives: Anti-infectives  (From admission, onward)   Start     Dose/Rate Route Frequency Ordered Stop   10/19/18 1400  ceFAZolin (ANCEF) IVPB 2g/100 mL premix     2 g 200 mL/hr over 30 Minutes Intravenous Every 8 hours 10/19/18 1307     10/19/18 1100  polymyxin B 500,000 Units, bacitracin 50,000 Units in sodium chloride 0.9 % 500 mL irrigation  Status:  Discontinued       As needed 10/19/18 1102 10/19/18 1311   10/19/18 0630  ceFAZolin (ANCEF) IVPB 2g/100 mL premix     2 g 200 mL/hr over 30 Minutes Intravenous On call to O.R. 10/19/18 0627 10/19/18 1215   10/19/18 0630  ceFAZolin (ANCEF) IVPB 2g/100 mL premix  Status:  Discontinued     2 g 200 mL/hr over 30 Minutes Intravenous On call to O.R. 10/19/18 9163 10/19/18 8466      Assessment/Plan: s/p Procedure(s): BILATERAL NIPPLE SPARING MASTECTOMY (Bilateral) BREAST RECONSTRUCTION WITH PLACEMENT OF TISSUE EXPANDER AND FLEX HD (ACELLULAR HYDRATED DERMIS) (Bilateral) Advance diet  Consider d/c if bp and lightheadedness improves  LOS: 0 days    Bethany [name] 10/20/2018

## 2018-10-21 ENCOUNTER — Encounter (HOSPITAL_COMMUNITY): Payer: Self-pay | Admitting: General Surgery

## 2018-10-21 DIAGNOSIS — N6012 Diffuse cystic mastopathy of left breast: Secondary | ICD-10-CM | POA: Diagnosis not present

## 2018-10-21 NOTE — Discharge Instructions (Signed)
INSTRUCTIONS FOR AFTER BREAST SURGERY   You are getting ready to undergo breast surgery.  You will likely have some questions about what to expect following your operation.  The following information will help you and your family understand what to expect when you are discharged from the hospital.  Following these guidelines will help ensure a smooth recovery and reduce risks of complications.   Postoperative instructions include information on: diet, wound care, medications and physical activity.  AFTER SURGERY Expect to go home after the procedure.  In some cases, you may need to spend one night in the hospital for observation.  DIET Breast surgery does not require a specific diet.  However, I have to mention that the healthier you eat the better your body can start healing. It is important to increasing your protein intake.  This means limiting the foods with sugar and carbohydrates.  Focus on vegetables and some meat.  If you have any liposuction during your procedure be sure to drink water.  If your urine is bright yellow, then it is concentrated, and you need to drink more water.  As a general rule after surgery, you should have 8 ounces of water every hour while awake.  If you find you are persistently nauseated or unable to take in liquids let us know.  NO TOBACCO USE or EXPOSURE.  This will slow your healing process and increase the risk of a wound.  WOUND CARE You can shower the day when the drain is removed which will be in two weeks.  Use fragrance free soap.  Dial, [place] and [place] are usually mild on the skin. While you have the drains, clean with baby wipes until the drain is removed.  If you have steri-strips / tape directly attached to your skin leave them in place. It is OK to get these wet.  No baths, pools or hot tubs for two weeks. We close your incision to leave the smallest and best-looking scar. No ointment or creams on your incisions until given the go ahead.  Especially not  Neosporin (Too many skin reactions with this one).  A few weeks after surgery you can use Mederma and start massaging the scar. We ask you to wear your binder or sports bra for the first 6 weeks around the clock, including while sleeping. This provides added comfort and helps reduce the fluid accumulation at the surgery site.  ACTIVITY No heavy lifting until cleared by the doctor.  This usually means no more than a half-gallon of milk.  It is OK to walk and climb stairs. In fact, moving your legs is very important to decrease your risk of a blood clot.  It will also help keep you from getting deconditioned.  Every 1 to 2 hours get up and walk for 5 minutes. This will help with a quicker recovery back to normal.  Let pain be your guide so you don't do too much.  NO, you cannot do the spring cleaning and don't plan on taking care of anyone else.  This is your time for TLC.  You will be more comfortable if you sleep and rest with your head elevated either with a few pillows under you or in a recliner.  No stomach sleeping for a few months.  WORK Everyone returns to work at different times. As a rough guide, most people take at least 1 - 2 weeks off prior to returning to work. If you need documentation for your job, bring the forms to  your postoperative follow up visit.  DRIVING Arrange for someone to bring you home from the hospital.  You may be able to drive a few days after surgery but not while taking any narcotics or valium.  BOWEL MOVEMENTS Constipation can occur after anesthesia and while taking pain medication.  It is important to stay ahead for your comfort.  We recommend taking Milk of Magnesia (2 tablespoons; twice a day) while taking the pain pills.  SEROMA This is fluid your body tried to put in the surgical site.  This is normal but if it creates tight skinny skin let us know.  It usually decreases in a few weeks.  WHEN TO CALL Call your surgeon's office if any of the following  occur:  Fever 101 degrees F or greater  Excessive bleeding or fluid from the incision site.  Pain that increases over time without aid from the medications  Redness, warmth, or pus draining from incision sites  Persistent nausea or inability to take in liquids  Severe misshapen area that underwent the operation.  Here are some resources:  1. Plastic surgery website: https://www.plasticsurgery.org/for-medical-professionals/education-and-resources/publications/breast-reconstruction-magazine 2. Breast Reconstruction Awareness Campaign:  HotelLives.co.nz 3. Plastic surgery Implant information:  https://www.plasticsurgery.org/patient-safety/breast-implant-safety   [facility] Plastic Surgery Specialist  What is the benefit of having a drain?  During surgery your tissue is separated between layers like the subcutaneous tissue and muscle.  This raw surface stimulates your body to fill the space with serous fluid.  This is normal but you don't want that fluid to collect and prevent healing.  A fluid collection can also become infected.  The Jackson-Pratt (JP) drain is used to eliminate this collection of fluid and allow the tissue to heal together.    Jackson-Pratt (JP) bulb    How to care for your drainage and suction unit at home Your drainage catheter will be connected to a collection device. The vacuum caused when the device is compressed allows drainage to collect in the device.     Wash your hands with soap and water before and after touching the system.  Empty the JP drain every 12 hours once you get home from your procedure.  Record the fluid amount on the record sheet included.  Start with stripping the drain tube to push the clots to the bulb.  Do this by pinching the tube with one hand near your skin.  Then with the other hand squeeze the tubing and work it toward the bulb.  This should be done several times a day.  This may collapse the tube which will correct on its  own.    Use a safety pin to attach your collection device to your clothing so there is no tension on the insertion site.    If you have drainage at the skin insertion site, you can apply a gauze dressing and secure it with tape.  If the drain falls out, apply a gauze dressing over the drain insertion site and secure with tape.   To empty the collection device:    Release the stopper on the top of the collection unit (bulb).   Pour contents into a measuring container such as a plastic medicine cup.   Record the day and amount of drainage on the attached sheet.  This should be done at least twice a day.    To compress the Jackson-Pratt Bulb:   Release the stopper at the top of the bulb.  Squeeze the bulb tightly in your fist, squeezing air out of the  bulb.   Replace the stopper while the bulb is compressed.   Be careful not to spill the contents when squeezing the bulb.  The drainage will start bright red and turn to pink and then yellow with time.  IMPORTANT: If the bulb is not squeezed before adding the stopper it will not draw out the fluid.  Care for the JP drain site and your skin daily:   You may shower three days after surgery.  Secure the drain to a ribbon or cloth around your waist while showering so it does not pull out while showering.  Be sure your hands are cleaned with soap and water.  Use a clean wet cotton swab to clean the skin around the drain site.   Use another cotton swab to place Vaseline or antibiotic ointment on the skin around the drain.     Contact your physician if any of the following occur:   The fluid in the bulb becomes cloudy.  Your temperature is greater than 101.4.   The incision opens.  If you have drainage at the skin insertion site, you can apply a gauze dressing and secure it with tape.  If the drain falls out, apply a gauze dressing over the drain insertion site and secure with tape.   You will usually have more drainage when  you are active than while you rest or are asleep. If the drainage increases significantly or is bloody call the physician                             Bring this record with you to each office visit Date  Drainage Volume  Date   Drainage volume

## 2018-10-21 NOTE — Discharge Summary (Signed)
Physician Discharge Summary  Patient ID: Bethany [name] MRN: [id number] DOB/AGE: 1975/09/13 43 y.o.  Admit date: 10/19/2018 Discharge date: 10/21/2018  Admission Diagnoses:  Discharge Diagnoses:  Active Problems:   Breast disorder in female   Surgery follow-up   Discharged Condition: good  Hospital Course: The patient was taken to the OR and underwent bilateral mastectomies. She did well for the surgery and was managed on the surgical post recovery unit.  She had episodes of low blood pressure which has resolved.  She is ready to go home.  She is tolerating food, ambulating, pain is controlled and she is happy with results so far.   Consults: None  Significant Diagnostic Studies: none  Treatments: surgery  Discharge Exam: Blood pressure 104/65, pulse 60, temperature 98.9 F (37.2 C), temperature source Oral, resp. rate 18, height 5\' 4"  (1.626 m), weight 66.9 kg, SpO2 98 %. General appearance: alert, cooperative and no distress Breasts: flaps doing well Incision/Wound: incisions closed and look good.  Drains in place.  Disposition: Discharge disposition: 01-Home or Self Care       Discharge Instructions    Call MD for:  persistant nausea and vomiting   Complete by: As directed    Call MD for:  severe uncontrolled pain   Complete by: As directed    Call MD for:  temperature >100.4   Complete by: As directed    Diet general   Complete by: As directed    Discharge wound care:   Complete by: As directed    Drain Care   Driving Restrictions   Complete by: As directed    No driving while on pain meds.   Increase activity slowly   Complete by: As directed    Lifting restrictions   Complete by: As directed    No heavy lifting.  5 pounds limit.     Allergies as of 10/21/2018      Reactions   Lamictal [lamotrigine] Rash   Codeine Hives   Oxycodone Hcl Hives      Medication List    TAKE these medications   acetaminophen 500 MG tablet Commonly known as:  TYLENOL Take 1,000 mg by mouth every 6 (six) hours as needed for moderate pain.   ARIPiprazole 20 MG tablet Commonly known as: ABILIFY Take 1 tablet (20 mg total) by mouth daily.   diazepam 2 MG tablet Commonly known as: Valium Take 1 tablet (2 mg total) by mouth every 12 (twelve) hours as needed for up to 10 days for muscle spasms.   LORazepam 0.5 MG tablet Commonly known as: Ativan Take 1 tablet (0.5 mg total) by mouth daily as needed for anxiety.   methocarbamol 750 MG tablet Commonly known as: ROBAXIN Take 1 tablet (750 mg total) by mouth 4 (four) times daily as needed (use for muscle cramps/pain).   ondansetron 4 MG tablet Commonly known as: ZOFRAN Take 4 mg by mouth every 8 (eight) hours as needed for nausea or vomiting.   sertraline 25 MG tablet Commonly known as: Zoloft Take 1 tablet (25 mg total) by mouth at bedtime.   traMADol 50 MG tablet Commonly known as: ULTRAM Take 1 tablet (50 mg total) by mouth every 8 (eight) hours as needed.            Discharge Care Instructions  (From admission, onward)         Start     Ordered   10/21/18 0000  Discharge wound care:    Comments: [facility]  10/21/18 0752         Follow-up Information    , Loel Lofty, DO In 1 week.   Specialty: Plastic Surgery Contact information: [address] [place] [address] [phone]        [name], MD In 2 weeks.   Specialty: General Surgery Contact information: [place] [address] [phone]           Signed: Wallace Going 10/21/2018, 7:53 AM

## 2018-10-26 ENCOUNTER — Telehealth: Payer: Self-pay | Admitting: Plastic Surgery

## 2018-10-26 MED ORDER — OXYCODONE-ACETAMINOPHEN 7.5-325 MG PO TABS: 1.0000 | ORAL_TABLET | Freq: Three times a day (TID) | ORAL | 0 refills | Status: AC | PRN

## 2018-10-26 NOTE — Telephone Encounter (Signed)

## 2018-10-26 NOTE — Telephone Encounter (Signed)
Called and spoke with the patient regarding the message below.  She stated that the Norco 5-325mg  was not working for the pain.  Asked the patient what surgery did she have and she stated (B) double mastectomy w/breast reconstruction w/expander.  She said when she was in the hospital and she was given Morphine which helped with the pain.  I explained that we don't prescribe Morphine.  She stated that she has been using her husband's Ibuprofen 800mg  and it has helped.  She mentioned that she also has the Methocarbamol for the muscle cramps.  I asked her who gave her the methocarbamol and she stated that Dr. Marla Roe did.  I informed her that Dr. Marla Roe gave her Diazepam for the muscle spasms, and Dr. Marlou Starks gave her the Methocarbamol.  She said she's not sure about the Diazepam but she will check her medications.  Informed her that Dr. Marla Roe sent in (4) prescriptions for her.  She said her daughter picked up the prescriptions for her so she's not sure which prescriptions she picked up.  I informed her that I will speak with Matthew,PA regarding something else for pain and give her a call back as soon as I can.  Patient verbalized understanding and agreed.//AB/CMA

## 2018-10-26 NOTE — Telephone Encounter (Signed)
Received two voicemail messages from patient Patient requesting call back from the nurse for clinical questions. Patient also requesting pain medication. Patient states that the pain medication she currently has does not help with her pain. Patient call back number is [phone]

## 2018-10-26 NOTE — Telephone Encounter (Signed)
Called and spoke with the patient and informed her that I spoke with [facility] regarding the message and he will inform Dr. Marla Roe of the message,and she will be receive a call back from [facility] regarding to what Dr. Marla Roe would like to do.  Patient verbalized understanding and agreed.  She stated that she does have the ATB she went and picked it up.  Asked her if she had the Diazepam and she stated that she did.//AB/CMA

## 2018-10-27 ENCOUNTER — Encounter: Payer: Self-pay | Admitting: Plastic Surgery

## 2018-10-27 ENCOUNTER — Ambulatory Visit (INDEPENDENT_AMBULATORY_CARE_PROVIDER_SITE_OTHER): Payer: PRIVATE HEALTH INSURANCE | Admitting: Plastic Surgery

## 2018-10-27 ENCOUNTER — Other Ambulatory Visit: Payer: Self-pay

## 2018-10-27 VITALS — BP 145/90 | HR 67 | Temp 98.7°F

## 2018-10-27 DIAGNOSIS — Z9013 Acquired absence of bilateral breasts and nipples: Secondary | ICD-10-CM

## 2018-10-27 HISTORY — DX: Acquired absence of bilateral breasts and nipples: Z90.13

## 2018-10-27 NOTE — Progress Notes (Signed)
Subjective:     Patient ID: Bethany [name], female    DOB: March 23, 1976, 43 y.o.   MRN: [id number]  Chief Complaint  Patient presents with  . Breast Problem    HPI: The patient is a 43 y.o. yrs old female here for follow-up after bilateral nipple sparing mastectomy with placement of expanders and Flex HD on 10/19/2018 with Dr. Marla Roe and Dr. Marlou Starks.  She had [place] 300 cc expanders placed at this time 50 cc in each.  Her drain output has been minimal but not ready for removal.  She does have some difficulty with pain control postop.  She was called in oxycodone yesterday and states that she has taken it and is feeling much better. She has not yet had a bowel movement and has been encouraged to take some MiraLAX.  Review of Systems  Constitutional: Negative.   Eyes: Negative.   Respiratory: Negative.   Cardiovascular: Negative.  Negative for leg swelling.  Gastrointestinal: Negative.   Musculoskeletal: Negative.   Skin: Negative.   Psychiatric/Behavioral: Negative.      Objective:   Vital Signs BP (!) 145/90 (BP Location: Right Leg, Patient Position: Sitting, Cuff Size: Normal)   Pulse 67   Temp 98.7 F (37.1 C) (Oral)   SpO2 99%  Vital Signs and Nursing Note Reviewed  Physical Exam  Constitutional: She is oriented to person, place, and time and well-developed, well-nourished, and in no distress.  HENT:  Head: Normocephalic and atraumatic.  Cardiovascular: Normal rate.  Pulmonary/Chest: Effort normal.  Neurological: She is alert and oriented to person, place, and time.  Skin: Skin is warm.  Psychiatric: Affect and judgment normal.      Assessment/Plan:     ICD-10-CM   1. Acquired absence of breast, bilateral  Z90.13   2. S/P mastectomy, bilateral  Z90.13           Patient ID: Bethany [name], female    DOB: [birth year], 43 y.o.   MRN: [id number]   Chief Complaint  Patient presents with  . Breast Problem    HPI Aashka Salomone is a 43 y.o. female  Review  of Systems  Constitutional: Negative.   Eyes: Negative.   Respiratory: Negative.   Cardiovascular: Negative.  Negative for leg swelling.  Gastrointestinal: Negative.   Musculoskeletal: Negative.   Skin: Negative.   Psychiatric/Behavioral: Negative.      Objective:   Vitals:   10/27/18 1006  BP: (!) 145/90  Pulse: 67  Temp: 98.7 F (37.1 C)  SpO2: 99%    Physical Exam HENT:     Head: Normocephalic and atraumatic.  Cardiovascular:     Rate and Rhythm: Normal rate.  Pulmonary:     Effort: Pulmonary effort is normal.  Skin:    General: Skin is warm.  Neurological:     Mental Status: She is alert and oriented to person, place, and time.  Psychiatric:        Mood and Affect: Affect normal.        Judgment: Judgment normal.      Assessment & Plan:     ICD-10-CM   1. Acquired absence of breast, bilateral  Z90.13   2. S/P mastectomy, bilateral  Z90.13     Continue with the sports bra we will plan on removing the drains next week.  We placed injectable saline in the Expander using a sterile technique: Right: 50 cc for a total of 100 / 300 cc Left: 50 cc for a total of 100 / 300  cc

## 2018-11-02 ENCOUNTER — Telehealth: Payer: Self-pay | Admitting: Plastic Surgery

## 2018-11-02 NOTE — Progress Notes (Signed)
     Patient ID: Bethany [name], female    DOB: May 22, 1975, 43 y.o.   MRN: [id number]   Chief Complaint  Patient presents with  . Follow-up    HPI Bethany [name] is a 43 y.o. female here for follow up after expander placement. At her last visit, expanders were filled to 100 cc bilaterally. She is doing very good today. Her incisions are healing nicely and she has no pain.  Her drains have consistently had minimal output. Over the past 1-2 weeks, she has consistently had about 5-10 cc in each drain.   She is ready for another fill today.  Review of Systems  Constitutional: Negative for chills, fatigue and fever.  HENT: Negative.   Respiratory: Negative.  Negative for chest tightness and shortness of breath.   Cardiovascular: Negative.  Negative for chest pain.  Gastrointestinal: Negative.   Musculoskeletal: Negative.   Skin: Negative.  Negative for color change, rash and wound.  Neurological: Negative.        Objective:   Vitals:   11/03/18 1022  BP: 117/75  Pulse: 86  Temp: 98.6 F (37 C)  SpO2: 100%    Physical Exam Nursing note reviewed. Exam conducted with a chaperone present.  Constitutional:      General: She is not in acute distress.    Appearance: Normal appearance. She is normal weight. She is not ill-appearing.  HENT:     Head: Normocephalic and atraumatic.  Eyes:     Conjunctiva/sclera: Conjunctivae normal.  Neck:     Musculoskeletal: Normal range of motion.  Cardiovascular:     Rate and Rhythm: Normal rate.     Pulses: Normal pulses.  Pulmonary:     Effort: Pulmonary effort is normal.  Abdominal:     General: Abdomen is flat.  Musculoskeletal: Normal range of motion.  Skin:    General: Skin is warm and dry.     Coloration: Skin is not pale.     Findings: No erythema or rash.  Neurological:     General: No focal deficit present.     Mental Status: She is alert and oriented to person, place, and time. Mental status is at baseline.  Psychiatric:         Mood and Affect: Mood normal.        Behavior: Behavior normal.    Assessment & Plan:     ICD-10-CM   1. Acquired absence of breast, bilateral  Z90.13    [name] is doing very well. She is pleased with her progress. She did have some pain from her last fill and is aware to take diazepam as needed after expansion to help relax her muscles and allow expansion. ]  Her drains were removed today. She can shower tomorrow.  We placed injectable saline in the Expander using a sterile technique: Right: 50 cc for a total of 150 / 300 cc Left: 50 cc for a total of 150 / 300 cc   Call with questions or concerns.  Return in about 2 weeks (around 11/17/2018).  Carola Rhine Makenzee Choudhry, PA-C

## 2018-11-02 NOTE — Telephone Encounter (Signed)

## 2018-11-03 ENCOUNTER — Other Ambulatory Visit: Payer: Self-pay

## 2018-11-03 ENCOUNTER — Ambulatory Visit (INDEPENDENT_AMBULATORY_CARE_PROVIDER_SITE_OTHER): Payer: PRIVATE HEALTH INSURANCE | Admitting: Surgical

## 2018-11-03 VITALS — BP 117/75 | HR 86 | Temp 98.6°F | Ht 64.0 in | Wt 130.0 lb

## 2018-11-03 DIAGNOSIS — Z9013 Acquired absence of bilateral breasts and nipples: Secondary | ICD-10-CM

## 2018-11-16 ENCOUNTER — Telehealth: Payer: Self-pay | Admitting: Plastic Surgery

## 2018-11-16 ENCOUNTER — Other Ambulatory Visit: Payer: Self-pay | Admitting: Plastic Surgery

## 2018-11-16 NOTE — Telephone Encounter (Signed)

## 2018-11-16 NOTE — Progress Notes (Addendum)
   Subjective:     Patient ID: Bethany [name], female    DOB: 1975-09-06, 43 y.o.   MRN: [id number]  Chief Complaint  Patient presents with  . Follow-up    2 weeks for (B) breast expanders    HPI: The patient is a 43 y.o. female here for follow-up after expander placement on 10/19/2018.  [name] is doing very well today.  She reports that she sometimes has intermittent pain in her breasts.  She is still not driving at this time because of this pain.  She reports seeing Dr. Marlou Starks yesterday.  She is going to be out of work until September 14.  She is a [employer] manager.  She said that she is doing some exercises such as climbing her fingers up the wall to increase range of motion.  She is having difficulty with this and would like physical therapy assistance.  Review of Systems  Constitutional: Negative for chills, fever and malaise/fatigue.       + change of activity   Respiratory: Negative.   Cardiovascular: Negative.   Genitourinary: Negative.   Musculoskeletal: Positive for myalgias.       + breast pain  Skin: Negative for itching and rash.  Neurological: Negative.      Objective:   Vital Signs BP 124/83 (BP Location: Right Arm, Patient Position: Sitting, Cuff Size: Normal)   Pulse 77   Temp (!) 97.3 F (36.3 C) (Temporal)   SpO2 100%  Vital Signs and Nursing Note Reviewed Chaperone present Physical Exam  Constitutional: She is oriented to person, place, and time and well-developed, well-nourished, and in no distress. No distress.  Thin female  HENT:  Head: Normocephalic and atraumatic.  Cardiovascular: Normal rate.  Pulmonary/Chest: Effort normal.  Neurological: She is alert and oriented to person, place, and time. Gait normal.  Skin: Skin is warm and dry. No rash noted. She is not diaphoretic. No erythema. No pallor.  Psychiatric: Mood and affect normal.      Assessment/Plan:     ICD-10-CM   1. Acquired absence of breast, bilateral  Z90.13   2. S/P mastectomy,  bilateral  Z90.13     [name] is doing well.  She does have some pain and has been taking methocarbamol which has helped.  She tolerated expansion today well, but it still having pain throughout the days after expansion. She is out of her diazepam. She understands to only take this for pain after fill or if her muscles feel tight. Avoid driving when taking.    We placed injectable saline in the Expander using a sterile technique: Right:50 cc for a total of 200/ 300cc Left: 50cc for a total of 200/ 300cc   A referral for PT will be sent.  This will help her with range of motion.  Follow-up in 2 weeks.  Carola Rhine Corie Vavra, PA-C 11/17/2018, 11:33 AM

## 2018-11-17 ENCOUNTER — Encounter: Payer: Self-pay | Admitting: Surgical

## 2018-11-17 ENCOUNTER — Other Ambulatory Visit: Payer: Self-pay

## 2018-11-17 ENCOUNTER — Ambulatory Visit (INDEPENDENT_AMBULATORY_CARE_PROVIDER_SITE_OTHER): Payer: PRIVATE HEALTH INSURANCE | Admitting: Surgical

## 2018-11-17 VITALS — BP 124/83 | HR 77 | Temp 97.3°F

## 2018-11-17 DIAGNOSIS — Z9013 Acquired absence of bilateral breasts and nipples: Secondary | ICD-10-CM

## 2018-11-17 NOTE — Addendum Note (Signed)
Addended byRoetta Sessions on: 11/17/2018 01:05 PM   Modules accepted: Orders

## 2018-11-18 MED ORDER — OXYCODONE HCL 5 MG PO CAPS: 5.0000 mg | ORAL_CAPSULE | Freq: Three times a day (TID) | ORAL | 0 refills | Status: AC | PRN

## 2018-11-18 MED ORDER — DIAZEPAM 2 MG PO TABS: 2.0000 mg | ORAL_TABLET | Freq: Two times a day (BID) | ORAL | 0 refills | Status: DC | PRN

## 2018-11-18 NOTE — Addendum Note (Signed)
Addended by: Wallace Going on: 11/18/2018 04:43 PM   Modules accepted: Orders

## 2018-11-20 NOTE — Telephone Encounter (Signed)
Rx denied.  Patient has already picked up prescription.//AB/CMA

## 2018-12-01 ENCOUNTER — Encounter: Payer: Self-pay | Admitting: Plastic Surgery

## 2018-12-01 ENCOUNTER — Ambulatory Visit (INDEPENDENT_AMBULATORY_CARE_PROVIDER_SITE_OTHER): Payer: PRIVATE HEALTH INSURANCE | Admitting: Plastic Surgery

## 2018-12-01 ENCOUNTER — Other Ambulatory Visit: Payer: Self-pay

## 2018-12-01 VITALS — BP 150/90 | HR 74 | Temp 97.8°F | Ht 64.0 in | Wt 127.0 lb

## 2018-12-01 DIAGNOSIS — Z9013 Acquired absence of bilateral breasts and nipples: Secondary | ICD-10-CM

## 2018-12-01 NOTE — Progress Notes (Signed)
   Subjective:    Patient ID: Bethany [name], female    DOB: 26-Aug-1975, 43 y.o.   MRN: [id number]  The patient is a 43 year old black female here with her mom for follow-up on her breast reconstruction.  She underwent bilateral nipple sparing mastectomies with expander placement.  She is doing extremely well.  She is very happy with her size.  She looks very similar to her preop size.  There is no sign of infection, seroma or hematoma.  The incisions are healing very nicely.  She feels comfortable with the size and would like to move forward with exchange.     Review of Systems  Constitutional: Negative for activity change and appetite change.  HENT: Negative.   Eyes: Negative.   Respiratory: Negative.  Negative for chest tightness.   Cardiovascular: Negative.   Gastrointestinal: Negative.  Negative for abdominal pain.  Endocrine: Negative.   Genitourinary: Negative.   Musculoskeletal: Negative.  Negative for back pain.  Hematological: Negative.   Psychiatric/Behavioral: Negative.        Objective:   Physical Exam Vitals signs and nursing note reviewed.  Constitutional:      Appearance: Normal appearance.  HENT:     Head: Normocephalic and atraumatic.  Cardiovascular:     Rate and Rhythm: Normal rate.  Pulmonary:     Effort: Pulmonary effort is normal.  Abdominal:     General: Abdomen is flat.  Neurological:     General: No focal deficit present.     Mental Status: She is alert and oriented to person, place, and time.  Psychiatric:        Mood and Affect: Mood normal.        Behavior: Behavior normal.        Thought Content: Thought content normal.         Assessment & Plan:     ICD-10-CM   1. S/P mastectomy, bilateral  Z90.13   We will plan on submitting for the expander exchange to implant bilaterally.    We placed injectable saline in the Expander using a sterile technique: Right: 50 cc for a total of 250 / 300 cc Left: 50 cc for a total of 250 / 300 cc  Order submitted for implants Pictures were obtained of the patient and placed in the chart with the patient's or guardian's permission.

## 2018-12-07 ENCOUNTER — Encounter (HOSPITAL_COMMUNITY): Payer: Self-pay | Admitting: Psychiatry

## 2018-12-07 ENCOUNTER — Other Ambulatory Visit: Payer: Self-pay

## 2018-12-07 ENCOUNTER — Ambulatory Visit (INDEPENDENT_AMBULATORY_CARE_PROVIDER_SITE_OTHER): Payer: PRIVATE HEALTH INSURANCE | Admitting: Psychiatry

## 2018-12-07 DIAGNOSIS — F411 Generalized anxiety disorder: Secondary | ICD-10-CM

## 2018-12-07 DIAGNOSIS — F3160 Bipolar disorder, current episode mixed, unspecified: Secondary | ICD-10-CM | POA: Diagnosis not present

## 2018-12-07 MED ORDER — ARIPIPRAZOLE 20 MG PO TABS: 20.0000 mg | ORAL_TABLET | Freq: Every day | ORAL | 1 refills | Status: DC

## 2018-12-07 MED ORDER — HYDROXYZINE PAMOATE 25 MG PO CAPS: 25.0000 mg | ORAL_CAPSULE | Freq: Two times a day (BID) | ORAL | 1 refills | Status: DC | PRN

## 2018-12-07 MED ORDER — SERTRALINE HCL 50 MG PO TABS: 50.0000 mg | ORAL_TABLET | Freq: Every day | ORAL | 1 refills | Status: DC

## 2018-12-07 NOTE — Progress Notes (Signed)
Virtual Visit via Telephone Note  I connected with Bethany [name] on 12/07/18 at  3:00 PM EDT by telephone and verified that I am speaking with the correct person using two identifiers.   I discussed the limitations, risks, security and privacy concerns of performing an evaluation and management service by telephone and the availability of in person appointments. I also discussed with the patient that there may be a patient responsible charge related to this service. The patient expressed understanding and agreed to proceed.   History of Present Illness: Patient was evaluated by phone session.  She has been feeling more anxious and sad lately.  She recently had bilateral mastectomy because of fibrocystic changes.  She had a history of malignancy but she was told she is cancer free but recently started to have mass in her breast and recommended to do bilateral mastectomy.  She was given pain medicine but she is no longer taking pain meds.  She is taking Valium 2 mg as needed which helps her breast cramp.  She admitted some nights poor sleep and feels more anxious and nervous about the future.  Currently she is not working but scheduled to go back to work in September.  She has another surgery scheduled for reconstruction in first week of September.  She had a good support from her 2 daughter and her parents.  She had decided to take a divorce from her husband because she feels he is not active in her life.  She admitted some sadness about that but she like to move on.  Her energy level is low.  Her appetite is fair.  She lost few pounds since the last visit but believe due to recent surgery.  She admitted sometimes crying spells but denies any hallucination, paranoia, severe mood swing, mania or any psychosis.  She denies any active or passive suicidal thoughts.  She has no tremors shakes or any EPS.  She takes Abilify and Zoloft 25 mg.  She is out of hydroxyzine and lorazepam.  She denies drinking or using  any illegal substances.  Past Psychiatric History:Reviewed. [facility]. TookZoloft and lorazepam byPCP.Noh/oinpatient treatment, suicidal attempt, psychosis or any mania. H/Oirritability, anger and mood swing. Tried Lamictal but stopped due to rash.  Recent Results (from the past 2160 hour(s))  Basic metabolic panel     Status: Abnormal   Collection Time: 10/14/18  3:00 PM  Result Value Ref Range   Sodium 139 135 - 145 mmol/L   Potassium 3.4 (L) 3.5 - 5.1 mmol/L   Chloride 106 98 - 111 mmol/L   CO2 22 22 - 32 mmol/L   Glucose, Bld 87 70 - 99 mg/dL   BUN 6 6 - 20 mg/dL   Creatinine, Ser 1.00 0.44 - 1.00 mg/dL   Calcium 9.3 8.9 - 10.3 mg/dL   GFR calc non Af Amer >60 >60 mL/min   GFR calc Af Amer >60 >60 mL/min   Anion gap 11 5 - 15    Comment: Performed at [facility] [address], [place] [address]  CBC     Status: None   Collection Time: 10/14/18  3:00 PM  Result Value Ref Range   WBC 5.3 4.0 - 10.5 K/uL   RBC 4.65 3.87 - 5.11 MIL/uL   Hemoglobin 13.3 12.0 - 15.0 g/dL   HCT 42.3 36.0 - 46.0 %   MCV 91.0 80.0 - 100.0 fL   MCH 28.6 26.0 - 34.0 pg  MCHC 31.4 30.0 - 36.0 g/dL   RDW 13.4 11.5 - 15.5 %   Platelets 196 150 - 400 K/uL   nRBC 0.0 0.0 - 0.2 %    Comment: Performed at [facility], [place] [address] [place], [place] [address]  SARS Coronavirus 2 (Performed in [facility] hospital lab)     Status: None   Collection Time: 10/15/18 10:16 AM   Specimen: Nasal Swab  Result Value Ref Range   SARS Coronavirus 2 NEGATIVE NEGATIVE    Comment: (NOTE) SARS-CoV-2 target nucleic acids are NOT DETECTED. The SARS-CoV-2 RNA is generally detectable in upper and lower respiratory specimens during the acute phase of infection. Negative results do not preclude SARS-CoV-2 infection, do not rule out co-infections with other pathogens, and should not be used as the sole basis for treatment or other patient  management decisions. Negative results must be combined with clinical observations, patient history, and epidemiological information. The expected result is Negative. Fact Sheet for Patients: SugarRoll.be Fact Sheet for Healthcare Providers: https://www.woods-mathews.com/ This test is not yet approved or cleared by the [place] FDA and  has been authorized for detection and/or diagnosis of SARS-CoV-2 by FDA under an Emergency Use Authorization (EUA). This EUA will remain  in effect (meaning this test can be used) for the duration of the COVID-19 declaration under Section 56 4(b)(1) of the Act, 21 U.S.C. section 360bbb-3(b)(1), unless the authorization is terminated or revoked sooner. Performed at [facility], [place] [address] [place], [place] [address]   Pregnancy, urine POC     Status: None   Collection Time: 10/19/18  7:09 AM  Result Value Ref Range   Preg Test, Ur NEGATIVE NEGATIVE    Comment:        THE SENSITIVITY OF THIS METHODOLOGY IS >24 mIU/mL   HIV Antibody (routine testing w rflx)     Status: None   Collection Time: 10/20/18  2:52 AM  Result Value Ref Range   HIV Screen 4th Generation wRfx Non Reactive Non Reactive    Comment: (NOTE) Performed At: [facility] [place], [place] [id number] Rush Farmer MD EY:[id number]      Psychiatric Specialty Exam: Physical Exam  ROS  There were no vitals taken for this visit.There is no height or weight on file to calculate BMI.  General Appearance: NA  Eye Contact:  NA  Speech:  Clear and Coherent and Slow  Volume:  Decreased  Mood:  Dysphoric  Affect:  NA  Thought Process:  Goal Directed  Orientation:  Full (Time, Place, and Person)  Thought Content:  Rumination  Suicidal Thoughts:  No  Homicidal Thoughts:  No  Memory:  Immediate;   Good Recent;   Good Remote;   Good  Judgement:  Good  Insight:  Good  Psychomotor Activity:  NA   Concentration:  Concentration: Good and Attention Span: Good  Recall:  Good  Fund of Knowledge:  Good  Language:  Good  Akathisia:  No  Handed:  Right  AIMS (if indicated):     Assets:  Communication Skills Desire for Improvement Housing Resilience Social Support Transportation  ADL's:  Intact  Cognition:  WNL  Sleep:   fair      Assessment and Plan: Bipolar disorder type I.  Generalized anxiety disorder.  I review her blood work results and recent notes.  She is experiencing increased anxiety.  She is ran out from her hydroxyzine and lorazepam.  She is taking Valium prescribed  by other provider to help her cramps and pain.  I recommend she should continue Valium which can also help her anxiety.  We will resume hydroxyzine to take 25 mg twice a day as needed and increase Zoloft 50 mg to help her residual depression and anxiety.  She wants to continue Abilify is helping her mood swings irritability and mania.  Discussed medication side effects and benefits.  She is not seeing Bethany for now but hoping to resume therapy once things get better.  She had a good support system from her parents and 2 daughters.  Discussed medication side effects and benefits.  Recommended to call us back if she has any question or any concern.  Follow-up in 2 months.  Follow Up Instructions:    I discussed the assessment and treatment plan with the patient. The patient was provided an opportunity to ask questions and all were answered. The patient agreed with the plan and demonstrated an understanding of the instructions.   The patient was advised to call back or seek an in-person evaluation if the symptoms worsen or if the condition fails to improve as anticipated.  I provided 20 minutes of non-face-to-face time during this encounter.   [name], MD

## 2018-12-21 ENCOUNTER — Encounter (HOSPITAL_BASED_OUTPATIENT_CLINIC_OR_DEPARTMENT_OTHER): Payer: Self-pay | Admitting: *Deleted

## 2018-12-21 ENCOUNTER — Other Ambulatory Visit: Payer: Self-pay

## 2018-12-21 ENCOUNTER — Telehealth: Payer: Self-pay

## 2018-12-21 NOTE — Telephone Encounter (Signed)

## 2018-12-22 ENCOUNTER — Ambulatory Visit (INDEPENDENT_AMBULATORY_CARE_PROVIDER_SITE_OTHER): Payer: PRIVATE HEALTH INSURANCE | Admitting: Surgical

## 2018-12-22 ENCOUNTER — Encounter: Payer: Self-pay | Admitting: Surgical

## 2018-12-22 VITALS — BP 120/65 | HR 83 | Temp 97.9°F | Ht 64.0 in | Wt 125.0 lb

## 2018-12-22 DIAGNOSIS — Z9013 Acquired absence of bilateral breasts and nipples: Secondary | ICD-10-CM

## 2018-12-22 MED ORDER — HYDROCODONE-ACETAMINOPHEN 5-325 MG PO TABS: 1.0000 | ORAL_TABLET | Freq: Four times a day (QID) | ORAL | 0 refills | Status: AC | PRN

## 2018-12-22 MED ORDER — ONDANSETRON HCL 4 MG PO TABS: 4.0000 mg | ORAL_TABLET | Freq: Three times a day (TID) | ORAL | 0 refills | Status: DC | PRN

## 2018-12-22 MED ORDER — CEPHALEXIN 500 MG PO CAPS: 500.0000 mg | ORAL_CAPSULE | Freq: Two times a day (BID) | ORAL | 0 refills | Status: AC

## 2018-12-22 NOTE — Progress Notes (Addendum)
Patient ID: Bethany [name], female    DOB: 06-Apr-1976, 43 y.o.   MRN: [id number]  Chief Complaint  Patient presents with  . Pre-op Exam      ICD-10-CM   1. S/P mastectomy, bilateral  Z90.13      History of Present Illness: Bethany [name] is a 43 y.o.  female  with a history of a left breast mass and subsequent mammogram showing extreme density requiring frequent MRIs.  Due to the patient's left breast mass and strong family history of breast cancer she decided to have bilateral mastectomies with immediate reconstruction.  She presents for preoperative evaluation for upcoming procedure, removal of bilateral tissue expanders with placement of bilateral silicone breast implants, scheduled for 12/30/2018 with Dr. Marla Roe.  The patient has not had problems with anesthesia.  She does not take any blood thinners.  She does not have any history of DVT/PE.  No family history of DVT/PE.  She does not have any recent illnesses or colds.  She currently has 280/300 cc in the left and in the right expander.  She is happy with the current size of her expander.  She understands that the implants may not look as large due to the softness and shape of the implant versus the expander.  She has 2 daughters at home, stepson and her mom.  Her mom will be taking her to and from surgery.    Past Medical History: Allergies: Allergies  Allergen Reactions  . Lamictal [Lamotrigine] Rash  . Codeine Hives  . Oxycodone Hcl Hives    Current Medications:  Current Outpatient Medications:  .  acetaminophen (TYLENOL) 500 MG tablet, Take 1,000 mg by mouth every 6 (six) hours as needed for moderate pain., Disp: , Rfl:  .  ARIPiprazole (ABILIFY) 20 MG tablet, Take 1 tablet (20 mg total) by mouth daily., Disp: 30 tablet, Rfl: 1 .  hydrOXYzine (VISTARIL) 25 MG capsule, Take 1 capsule (25 mg total) by mouth 2 (two) times daily as needed for anxiety., Disp: 60 capsule, Rfl: 1 .  sertraline (ZOLOFT) 50 MG tablet, Take  1 tablet (50 mg total) by mouth at bedtime., Disp: 30 tablet, Rfl: 1  Past Medical Problems: Past Medical History:  Diagnosis Date  . Anemia   . Anxiety   . Depression   . Fibrocystic disease of both breasts   . History of cervical dysplasia    laser surgery  . Insomnia   . Vaginal Pap smear, abnormal     Past Surgical History: Past Surgical History:  Procedure Laterality Date  . BREAST FIBROADENOMA SURGERY  2011, 2008   benign   . BREAST RECONSTRUCTION WITH PLACEMENT OF TISSUE EXPANDER AND FLEX HD (ACELLULAR HYDRATED DERMIS) Bilateral 10/19/2018   Procedure: BREAST RECONSTRUCTION WITH PLACEMENT OF TISSUE EXPANDER AND FLEX HD (ACELLULAR HYDRATED DERMIS);  Surgeon: Wallace Going, DO;  Location: [facility];  Service: Plastics;  Laterality: Bilateral;  . CESAREAN SECTION  1995  . HYSTEROSCOPY WITH NOVASURE N/A 01/02/2016   Procedure: NOVASURE;  Surgeon: Woodroe Mode, MD;  Location: [facility];  Service: Gynecology;  Laterality: N/A;  . NIPPLE SPARING MASTECTOMY Bilateral 10/19/2018   Procedure: BILATERAL NIPPLE SPARING MASTECTOMY;  Surgeon: Jovita Kussmaul, MD;  Location: [facility];  Service: General;  Laterality: Bilateral;  . TUBAL LIGATION  2003    Social History: Social History   Socioeconomic History  . Marital status: Married    Spouse name: Not on file  . Number of children: Not on  file  . Years of education: Not on file  . Highest education level: Not on file  Occupational History  . Not on file  Social Needs  . Financial resource strain: Not on file  . Food insecurity    Worry: Not on file    Inability: Not on file  . Transportation needs    Medical: Not on file    Non-medical: Not on file  Tobacco Use  . Smoking status: Never Smoker  . Smokeless tobacco: Never Used  Substance and Sexual Activity  . Alcohol use: No    Alcohol/week: 0.0 standard drinks  . Drug use: No  . Sexual activity: Yes    Partners: Male    Birth control/protection: Surgical  Lifestyle  .  Physical activity    Days per week: Not on file    Minutes per session: Not on file  . Stress: Not on file  Relationships  . Social Herbalist on phone: Not on file    Gets together: Not on file    Attends religious service: Not on file    Active member of club or organization: Not on file    Attends meetings of clubs or organizations: Not on file    Relationship status: Not on file  . Intimate partner violence    Fear of current or ex partner: Not on file    Emotionally abused: Not on file    Physically abused: Not on file    Forced sexual activity: Not on file  Other Topics Concern  . Not on file  Social History Narrative   Married, 3 daughters.  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          Family History: Family History  Problem Relation Age of Onset  . Anxiety disorder Father   . Depression Father   . Diabetes Maternal Grandmother   . Hypertension Paternal Grandfather   . Cancer Maternal Aunt        breast  . Breast cancer Maternal Aunt   . Alcohol abuse Maternal Uncle   . Alcohol abuse Cousin   . Drug abuse Cousin   . Stroke Neg Hx     Review of Systems: Review of Systems  Constitutional: Negative for chills, diaphoresis, fever, malaise/fatigue and weight loss.  Respiratory: Negative.   Cardiovascular: Negative.   Gastrointestinal: Negative.   Genitourinary: Negative.   Musculoskeletal: Positive for myalgias. Negative for back pain, joint pain and neck pain.  Skin: Negative for itching and rash.  Neurological: Negative.     Physical Exam: Vital Signs BP 120/65 (BP Location: Left Leg, Patient Position: Sitting, Cuff Size: Normal)   Pulse 83   Temp 97.9 F (36.6 C) (Temporal)   Ht 5\' 4"  (1.626 m)   Wt 125 lb (56.7 kg)   SpO2 99%   BMI 21.46 kg/m   Physical Exam Exam conducted with a chaperone present.  Constitutional:      General: She is not in acute distress.    Appearance: Normal appearance. She is not  ill-appearing.  HENT:     Head: Normocephalic and atraumatic.  Eyes:     Pupils: Pupils are equal, round Neck:     Musculoskeletal: Normal range of motion.  Cardiovascular:     Rate and Rhythm: Normal rate and regular rhythm.     Pulses: Normal pulses.     Heart sounds: Normal heart sounds. No murmur.  Pulmonary:     Effort: Pulmonary  effort is normal. No respiratory distress.     Breath sounds: Normal breath sounds. No wheezing.  Abdominal:     General: Abdomen is flat. There is no distension.     Palpations: Abdomen is soft.     Tenderness: There is no abdominal tenderness.  Musculoskeletal: Normal range of motion.  Skin:    General: Skin is warm and dry.     Findings: No erythema or rash.  Neurological:     General: No focal deficit present.     Mental Status: She is alert and oriented to person, place, and time. Mental status is at baseline.     Motor: No weakness.  Psychiatric:        Mood and Affect: Mood normal.        Behavior: Behavior normal.  Breast: Previous mastectomy incisions are healed.    Assessment/Plan: Mrs. Bethany [name] is scheduled for removal of bilateral tissue expanders with placement of bilateral silicone breast implants on December 30, 2018 with Dr. Marla Roe.  Risks, benefits, and alternatives of procedure discussed, questions answered and consent obtained.    Prescriptions sent to pharmacy.  Patient knows to take benadryl while taking the norco.   We placed injectable saline in the Expander using a sterile technique: Right: 30 cc for a total of 280 / 300 cc Left: 30 cc for a total of 280 / 300 cc  The risks that can be encountered with and after placement of a breast implant placement were discussed and include the following but not limited to these: bleeding, infection, delayed healing, anesthesia risks, skin sensation changes, injury to structures including nerves, blood vessels, and muscles which may be temporary or permanent, allergies to tape,  suture materials and glues, blood products, topical preparations or injected agents, skin contour irregularities, skin discoloration and swelling, deep vein thrombosis, cardiac and pulmonary complications, pain, which may persist, fluid accumulation, wrinkling of the skin over the implant, changes in nipple or breast sensation, implant leakage or rupture, faulty position of the implant, persistent pain, formation of tight scar tissue around the implant (capsular contracture), possible need for revisional surgery or staged procedures.  Electronically signed by: Carola Rhine Lanissa Cashen, PA-C 12/22/2018 12:17 PM

## 2018-12-22 NOTE — H&P (View-Only) (Signed)
Patient ID: Bethany [name], female    DOB: 03-21-1976, 43 y.o.   MRN: [id number]  Chief Complaint  Patient presents with  . Pre-op Exam      ICD-10-CM   1. S/P mastectomy, bilateral  Z90.13      History of Present Illness: Bethany [name] is a 43 y.o.  female  with a history of a left breast mass and subsequent mammogram showing extreme density requiring frequent MRIs.  Due to the patient's left breast mass and strong family history of breast cancer she decided to have bilateral mastectomies with immediate reconstruction.  She presents for preoperative evaluation for upcoming procedure, removal of bilateral tissue expanders with placement of bilateral silicone breast implants, scheduled for 12/30/2018 with Dr. Marla Roe.  The patient has not had problems with anesthesia.  She does not take any blood thinners.  She does not have any history of DVT/PE.  No family history of DVT/PE.  She does not have any recent illnesses or colds.  She currently has 280/300 cc in the left and in the right expander.  She is happy with the current size of her expander.  She understands that the implants may not look as large due to the softness and shape of the implant versus the expander.  She has 2 daughters at home, stepson and her mom.  Her mom will be taking her to and from surgery.    Past Medical History: Allergies: Allergies  Allergen Reactions  . Lamictal [Lamotrigine] Rash  . Codeine Hives  . Oxycodone Hcl Hives    Current Medications:  Current Outpatient Medications:  .  acetaminophen (TYLENOL) 500 MG tablet, Take 1,000 mg by mouth every 6 (six) hours as needed for moderate pain., Disp: , Rfl:  .  ARIPiprazole (ABILIFY) 20 MG tablet, Take 1 tablet (20 mg total) by mouth daily., Disp: 30 tablet, Rfl: 1 .  hydrOXYzine (VISTARIL) 25 MG capsule, Take 1 capsule (25 mg total) by mouth 2 (two) times daily as needed for anxiety., Disp: 60 capsule, Rfl: 1 .  sertraline (ZOLOFT) 50 MG tablet, Take  1 tablet (50 mg total) by mouth at bedtime., Disp: 30 tablet, Rfl: 1  Past Medical Problems: Past Medical History:  Diagnosis Date  . Anemia   . Anxiety   . Depression   . Fibrocystic disease of both breasts   . History of cervical dysplasia    laser surgery  . Insomnia   . Vaginal Pap smear, abnormal     Past Surgical History: Past Surgical History:  Procedure Laterality Date  . BREAST FIBROADENOMA SURGERY  2011, 2008   benign   . BREAST RECONSTRUCTION WITH PLACEMENT OF TISSUE EXPANDER AND FLEX HD (ACELLULAR HYDRATED DERMIS) Bilateral 10/19/2018   Procedure: BREAST RECONSTRUCTION WITH PLACEMENT OF TISSUE EXPANDER AND FLEX HD (ACELLULAR HYDRATED DERMIS);  Surgeon: Wallace Going, DO;  Location: [facility];  Service: Plastics;  Laterality: Bilateral;  . CESAREAN SECTION  1995  . HYSTEROSCOPY WITH NOVASURE N/A 01/02/2016   Procedure: NOVASURE;  Surgeon: Woodroe Mode, MD;  Location: [facility];  Service: Gynecology;  Laterality: N/A;  . NIPPLE SPARING MASTECTOMY Bilateral 10/19/2018   Procedure: BILATERAL NIPPLE SPARING MASTECTOMY;  Surgeon: Jovita Kussmaul, MD;  Location: [facility];  Service: General;  Laterality: Bilateral;  . TUBAL LIGATION  2003    Social History: Social History   Socioeconomic History  . Marital status: Married    Spouse name: Not on file  . Number of children: Not on  file  . Years of education: Not on file  . Highest education level: Not on file  Occupational History  . Not on file  Social Needs  . Financial resource strain: Not on file  . Food insecurity    Worry: Not on file    Inability: Not on file  . Transportation needs    Medical: Not on file    Non-medical: Not on file  Tobacco Use  . Smoking status: Never Smoker  . Smokeless tobacco: Never Used  Substance and Sexual Activity  . Alcohol use: No    Alcohol/week: 0.0 standard drinks  . Drug use: No  . Sexual activity: Yes    Partners: Male    Birth control/protection: Surgical  Lifestyle  .  Physical activity    Days per week: Not on file    Minutes per session: Not on file  . Stress: Not on file  Relationships  . Social Herbalist on phone: Not on file    Gets together: Not on file    Attends religious service: Not on file    Active member of club or organization: Not on file    Attends meetings of clubs or organizations: Not on file    Relationship status: Not on file  . Intimate partner violence    Fear of current or ex partner: Not on file    Emotionally abused: Not on file    Physically abused: Not on file    Forced sexual activity: Not on file  Other Topics Concern  . Not on file  Social History Narrative   Married, 3 daughters.  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          Family History: Family History  Problem Relation Age of Onset  . Anxiety disorder Father   . Depression Father   . Diabetes Maternal Grandmother   . Hypertension Paternal Grandfather   . Cancer Maternal Aunt        breast  . Breast cancer Maternal Aunt   . Alcohol abuse Maternal Uncle   . Alcohol abuse Cousin   . Drug abuse Cousin   . Stroke Neg Hx     Review of Systems: Review of Systems  Constitutional: Negative for chills, diaphoresis, fever, malaise/fatigue and weight loss.  Respiratory: Negative.   Cardiovascular: Negative.   Gastrointestinal: Negative.   Genitourinary: Negative.   Musculoskeletal: Positive for myalgias. Negative for back pain, joint pain and neck pain.  Skin: Negative for itching and rash.  Neurological: Negative.     Physical Exam: Vital Signs BP 120/65 (BP Location: Left Leg, Patient Position: Sitting, Cuff Size: Normal)   Pulse 83   Temp 97.9 F (36.6 C) (Temporal)   Ht 5\' 4"  (1.626 m)   Wt 125 lb (56.7 kg)   SpO2 99%   BMI 21.46 kg/m   Physical Exam Exam conducted with a chaperone present.  Constitutional:      General: She is not in acute distress.    Appearance: Normal appearance. She is not  ill-appearing.  HENT:     Head: Normocephalic and atraumatic.  Eyes:     Pupils: Pupils are equal, round Neck:     Musculoskeletal: Normal range of motion.  Cardiovascular:     Rate and Rhythm: Normal rate and regular rhythm.     Pulses: Normal pulses.     Heart sounds: Normal heart sounds. No murmur.  Pulmonary:     Effort: Pulmonary  effort is normal. No respiratory distress.     Breath sounds: Normal breath sounds. No wheezing.  Abdominal:     General: Abdomen is flat. There is no distension.     Palpations: Abdomen is soft.     Tenderness: There is no abdominal tenderness.  Musculoskeletal: Normal range of motion.  Skin:    General: Skin is warm and dry.     Findings: No erythema or rash.  Neurological:     General: No focal deficit present.     Mental Status: She is alert and oriented to person, place, and time. Mental status is at baseline.     Motor: No weakness.  Psychiatric:        Mood and Affect: Mood normal.        Behavior: Behavior normal.  Breast: Previous mastectomy incisions are healed.    Assessment/Plan: [name] is scheduled for removal of bilateral tissue expanders with placement of bilateral silicone breast implants on December 30, 2018 with Dr. Marla Roe.  Risks, benefits, and alternatives of procedure discussed, questions answered and consent obtained.    Prescriptions sent to pharmacy.  Patient knows to take benadryl while taking the norco.   We placed injectable saline in the Expander using a sterile technique: Right: 30 cc for a total of 280 / 300 cc Left: 30 cc for a total of 280 / 300 cc  The risks that can be encountered with and after placement of a breast implant placement were discussed and include the following but not limited to these: bleeding, infection, delayed healing, anesthesia risks, skin sensation changes, injury to structures including nerves, blood vessels, and muscles which may be temporary or permanent, allergies to tape,  suture materials and glues, blood products, topical preparations or injected agents, skin contour irregularities, skin discoloration and swelling, deep vein thrombosis, cardiac and pulmonary complications, pain, which may persist, fluid accumulation, wrinkling of the skin over the implant, changes in nipple or breast sensation, implant leakage or rupture, faulty position of the implant, persistent pain, formation of tight scar tissue around the implant (capsular contracture), possible need for revisional surgery or staged procedures.  Electronically signed by: Carola Rhine Samaira Holzworth, PA-C 12/22/2018 12:17 PM

## 2018-12-26 ENCOUNTER — Other Ambulatory Visit (HOSPITAL_COMMUNITY)
Admission: RE | Admit: 2018-12-26 | Discharge: 2018-12-26 | Disposition: A | Discharge status: Home / Self Care | Payer: PRIVATE HEALTH INSURANCE | Source: Ambulatory Visit | Attending: Plastic Surgery | Admitting: Plastic Surgery

## 2018-12-26 DIAGNOSIS — Z01812 Encounter for preprocedural laboratory examination: Secondary | ICD-10-CM | POA: Insufficient documentation

## 2018-12-26 DIAGNOSIS — Z20828 Contact with and (suspected) exposure to other viral communicable diseases: Secondary | ICD-10-CM | POA: Diagnosis not present

## 2018-12-27 LAB — NOVEL CORONAVIRUS, NAA (HOSP ORDER, SEND-OUT TO REF LAB; TAT 18-24 HRS): SARS-CoV-2, NAA: NOT DETECTED

## 2018-12-30 ENCOUNTER — Ambulatory Visit (HOSPITAL_BASED_OUTPATIENT_CLINIC_OR_DEPARTMENT_OTHER): Payer: PRIVATE HEALTH INSURANCE | Admitting: Certified Registered"

## 2018-12-30 ENCOUNTER — Encounter (HOSPITAL_BASED_OUTPATIENT_CLINIC_OR_DEPARTMENT_OTHER)
Admission: RE | Disposition: A | Discharge status: Home / Self Care | Payer: Self-pay | Source: Home / Self Care | Attending: Plastic Surgery

## 2018-12-30 ENCOUNTER — Encounter (HOSPITAL_BASED_OUTPATIENT_CLINIC_OR_DEPARTMENT_OTHER): Payer: Self-pay | Admitting: *Deleted

## 2018-12-30 ENCOUNTER — Ambulatory Visit (HOSPITAL_BASED_OUTPATIENT_CLINIC_OR_DEPARTMENT_OTHER)
Admission: RE | Admit: 2018-12-30 | Discharge: 2018-12-30 | Disposition: A | Discharge status: Home / Self Care | Payer: PRIVATE HEALTH INSURANCE | Attending: Plastic Surgery | Admitting: Plastic Surgery

## 2018-12-30 ENCOUNTER — Other Ambulatory Visit: Payer: Self-pay

## 2018-12-30 DIAGNOSIS — Z79899 Other long term (current) drug therapy: Secondary | ICD-10-CM | POA: Diagnosis not present

## 2018-12-30 DIAGNOSIS — Z9013 Acquired absence of bilateral breasts and nipples: Secondary | ICD-10-CM | POA: Insufficient documentation

## 2018-12-30 DIAGNOSIS — Z885 Allergy status to narcotic agent status: Secondary | ICD-10-CM | POA: Diagnosis not present

## 2018-12-30 DIAGNOSIS — Z45811 Encounter for adjustment or removal of right breast implant: Secondary | ICD-10-CM

## 2018-12-30 DIAGNOSIS — D249 Benign neoplasm of unspecified breast: Secondary | ICD-10-CM

## 2018-12-30 DIAGNOSIS — F419 Anxiety disorder, unspecified: Secondary | ICD-10-CM | POA: Diagnosis not present

## 2018-12-30 DIAGNOSIS — Z888 Allergy status to other drugs, medicaments and biological substances status: Secondary | ICD-10-CM | POA: Diagnosis not present

## 2018-12-30 DIAGNOSIS — F329 Major depressive disorder, single episode, unspecified: Secondary | ICD-10-CM | POA: Insufficient documentation

## 2018-12-30 DIAGNOSIS — Z803 Family history of malignant neoplasm of breast: Secondary | ICD-10-CM | POA: Insufficient documentation

## 2018-12-30 DIAGNOSIS — Z45812 Encounter for adjustment or removal of left breast implant: Secondary | ICD-10-CM | POA: Diagnosis not present

## 2018-12-30 DIAGNOSIS — Z421 Encounter for breast reconstruction following mastectomy: Secondary | ICD-10-CM | POA: Diagnosis not present

## 2018-12-30 HISTORY — PX: REMOVAL OF BILATERAL TISSUE EXPANDERS WITH PLACEMENT OF BILATERAL BREAST IMPLANTS: SHX6431

## 2018-12-30 LAB — POCT PREGNANCY, URINE: Preg Test, Ur: NEGATIVE

## 2018-12-30 SURGERY — REMOVAL, TISSUE EXPANDER, BREAST, BILATERAL, WITH BILATERAL IMPLANT IMPLANT INSERTION
Anesthesia: General | Site: Breast | Laterality: Bilateral

## 2018-12-30 MED ORDER — DEXAMETHASONE SODIUM PHOSPHATE 10 MG/ML IJ SOLN
INTRAMUSCULAR | Status: DC | PRN
  Administered 2018-12-30: 4 mg via INTRAVENOUS

## 2018-12-30 MED ORDER — SODIUM CHLORIDE 0.9 % IV SOLN
INTRAVENOUS | Status: DC | PRN
  Administered 2018-12-30: 500 mL

## 2018-12-30 MED ORDER — ONDANSETRON HCL 4 MG/2ML IJ SOLN
INTRAMUSCULAR | Status: AC
  Filled 2018-12-30: qty 2

## 2018-12-30 MED ORDER — PROPOFOL 10 MG/ML IV BOLUS
INTRAVENOUS | Status: DC | PRN
  Administered 2018-12-30: 110 mg via INTRAVENOUS

## 2018-12-30 MED ORDER — SUGAMMADEX SODIUM 200 MG/2ML IV SOLN
INTRAVENOUS | Status: DC | PRN
  Administered 2018-12-30: 125 mg via INTRAVENOUS

## 2018-12-30 MED ORDER — FENTANYL CITRATE (PF) 100 MCG/2ML IJ SOLN: 25.0000 ug | INTRAMUSCULAR | Status: DC | PRN

## 2018-12-30 MED ORDER — CEFAZOLIN SODIUM-DEXTROSE 2-4 GM/100ML-% IV SOLN
INTRAVENOUS | Status: AC
  Filled 2018-12-30: qty 100

## 2018-12-30 MED ORDER — ACETAMINOPHEN 10 MG/ML IV SOLN: 1000.0000 mg | Freq: Once | INTRAVENOUS | Status: DC | PRN

## 2018-12-30 MED ORDER — PROMETHAZINE HCL 25 MG/ML IJ SOLN: 6.2500 mg | INTRAMUSCULAR | Status: DC | PRN

## 2018-12-30 MED ORDER — PHENYLEPHRINE 40 MCG/ML (10ML) SYRINGE FOR IV PUSH (FOR BLOOD PRESSURE SUPPORT)
PREFILLED_SYRINGE | INTRAVENOUS | Status: AC
  Filled 2018-12-30: qty 10

## 2018-12-30 MED ORDER — FENTANYL CITRATE (PF) 100 MCG/2ML IJ SOLN: 50.0000 ug | INTRAMUSCULAR | Status: DC | PRN

## 2018-12-30 MED ORDER — DEXAMETHASONE SODIUM PHOSPHATE 10 MG/ML IJ SOLN
INTRAMUSCULAR | Status: AC
  Filled 2018-12-30: qty 1

## 2018-12-30 MED ORDER — EPHEDRINE 5 MG/ML INJ
INTRAVENOUS | Status: AC
  Filled 2018-12-30: qty 10

## 2018-12-30 MED ORDER — PROPOFOL 10 MG/ML IV BOLUS
INTRAVENOUS | Status: AC
  Filled 2018-12-30: qty 20

## 2018-12-30 MED ORDER — SCOPOLAMINE 1 MG/3DAYS TD PT72
1.0000 | MEDICATED_PATCH | TRANSDERMAL | Status: DC
  Administered 2018-12-30: 1.5 mg via TRANSDERMAL

## 2018-12-30 MED ORDER — LACTATED RINGERS IV SOLN
INTRAVENOUS | Status: DC
  Administered 2018-12-30 (×2): via INTRAVENOUS

## 2018-12-30 MED ORDER — BUPIVACAINE-EPINEPHRINE 0.25% -1:200000 IJ SOLN
INTRAMUSCULAR | Status: DC | PRN
  Administered 2018-12-30: 30 mL

## 2018-12-30 MED ORDER — FENTANYL CITRATE (PF) 100 MCG/2ML IJ SOLN
INTRAMUSCULAR | Status: DC | PRN
  Administered 2018-12-30: 25 ug via INTRAVENOUS
  Administered 2018-12-30: 50 ug via INTRAVENOUS

## 2018-12-30 MED ORDER — ONDANSETRON HCL 4 MG/2ML IJ SOLN
INTRAMUSCULAR | Status: DC | PRN
  Administered 2018-12-30: 4 mg via INTRAVENOUS

## 2018-12-30 MED ORDER — PHENYLEPHRINE HCL (PRESSORS) 10 MG/ML IV SOLN
INTRAVENOUS | Status: DC | PRN
  Administered 2018-12-30: 40 ug via INTRAVENOUS

## 2018-12-30 MED ORDER — CEFAZOLIN SODIUM-DEXTROSE 2-4 GM/100ML-% IV SOLN
2.0000 g | INTRAVENOUS | Status: AC
  Administered 2018-12-30: 15:00:00 2 g via INTRAVENOUS

## 2018-12-30 MED ORDER — LIDOCAINE HCL (CARDIAC) PF 100 MG/5ML IV SOSY
PREFILLED_SYRINGE | INTRAVENOUS | Status: DC | PRN
  Administered 2018-12-30: 20 mg via INTRAVENOUS
  Administered 2018-12-30: 40 mg via INTRAVENOUS

## 2018-12-30 MED ORDER — FENTANYL CITRATE (PF) 100 MCG/2ML IJ SOLN
INTRAMUSCULAR | Status: AC
  Filled 2018-12-30: qty 2

## 2018-12-30 MED ORDER — EPHEDRINE SULFATE 50 MG/ML IJ SOLN
INTRAMUSCULAR | Status: DC | PRN
  Administered 2018-12-30 (×3): 5 mg via INTRAVENOUS
  Administered 2018-12-30: 10 mg via INTRAVENOUS
  Administered 2018-12-30 (×2): 5 mg via INTRAVENOUS

## 2018-12-30 MED ORDER — ROCURONIUM BROMIDE 100 MG/10ML IV SOLN
INTRAVENOUS | Status: DC | PRN
  Administered 2018-12-30: 40 mg via INTRAVENOUS

## 2018-12-30 MED ORDER — MIDAZOLAM HCL 2 MG/2ML IJ SOLN
INTRAMUSCULAR | Status: AC
  Filled 2018-12-30: qty 2

## 2018-12-30 MED ORDER — CHLORHEXIDINE GLUCONATE CLOTH 2 % EX PADS: 6.0000 | MEDICATED_PAD | Freq: Once | CUTANEOUS | Status: DC

## 2018-12-30 MED ORDER — SODIUM CHLORIDE 0.9 % IV SOLN
INTRAVENOUS | Status: AC
  Filled 2018-12-30: qty 500000

## 2018-12-30 MED ORDER — MIDAZOLAM HCL 2 MG/2ML IJ SOLN
1.0000 mg | INTRAMUSCULAR | Status: DC | PRN
  Administered 2018-12-30: 2 mg via INTRAVENOUS

## 2018-12-30 SURGICAL SUPPLY — 71 items
ADH SKN CLS APL DERMABOND .7 (GAUZE/BANDAGES/DRESSINGS)
APL PRP STRL LF DISP 70% ISPRP (MISCELLANEOUS) ×1
BAG DECANTER FOR FLEXI CONT (MISCELLANEOUS) ×3 IMPLANT
BINDER BREAST LRG (GAUZE/BANDAGES/DRESSINGS) IMPLANT
BINDER BREAST MEDIUM (GAUZE/BANDAGES/DRESSINGS) IMPLANT
BINDER BREAST XLRG (GAUZE/BANDAGES/DRESSINGS) IMPLANT
BINDER BREAST XXLRG (GAUZE/BANDAGES/DRESSINGS) IMPLANT
BIOPATCH RED 1 DISK 7.0 (GAUZE/BANDAGES/DRESSINGS) IMPLANT
BIOPATCH RED 1IN DISK 7.0MM (GAUZE/BANDAGES/DRESSINGS)
BLADE HEX COATED 2.75 (ELECTRODE) ×3 IMPLANT
BLADE SURG 15 STRL LF DISP TIS (BLADE) ×2 IMPLANT
BLADE SURG 15 STRL SS (BLADE) ×6
CANISTER SUCT 1200ML W/VALVE (MISCELLANEOUS) ×3 IMPLANT
CHLORAPREP W/TINT 26 (MISCELLANEOUS) ×3 IMPLANT
CORD BIPOLAR FORCEPS 12FT (ELECTRODE) IMPLANT
COVER BACK TABLE REUSABLE LG (DRAPES) ×3 IMPLANT
COVER MAYO STAND REUSABLE (DRAPES) ×3 IMPLANT
COVER WAND RF STERILE (DRAPES) IMPLANT
DECANTER SPIKE VIAL GLASS SM (MISCELLANEOUS) IMPLANT
DERMABOND ADVANCED (GAUZE/BANDAGES/DRESSINGS)
DERMABOND ADVANCED .7 DNX12 (GAUZE/BANDAGES/DRESSINGS) IMPLANT
DRAIN CHANNEL 19F RND (DRAIN) IMPLANT
DRAPE LAPAROSCOPIC ABDOMINAL (DRAPES) ×3 IMPLANT
DRSG PAD ABDOMINAL 8X10 ST (GAUZE/BANDAGES/DRESSINGS) ×6 IMPLANT
ELECT BLADE 4.0 EZ CLEAN MEGAD (MISCELLANEOUS) ×3
ELECT REM PT RETURN 9FT ADLT (ELECTROSURGICAL) ×3
ELECTRODE BLDE 4.0 EZ CLN MEGD (MISCELLANEOUS) ×1 IMPLANT
ELECTRODE REM PT RTRN 9FT ADLT (ELECTROSURGICAL) ×1 IMPLANT
EVACUATOR SILICONE 100CC (DRAIN) IMPLANT
GAUZE SPONGE 4X4 12PLY STRL LF (GAUZE/BANDAGES/DRESSINGS) IMPLANT
GLOVE BIO SURGEON STRL SZ 6.5 (GLOVE) ×6 IMPLANT
GLOVE BIO SURGEON STRL SZ7 (GLOVE) ×3 IMPLANT
GLOVE BIO SURGEONS STRL SZ 6.5 (GLOVE) ×3
GOWN STRL REUS W/ TWL LRG LVL3 (GOWN DISPOSABLE) ×2 IMPLANT
GOWN STRL REUS W/TWL LRG LVL3 (GOWN DISPOSABLE) ×6
IMPL BREAST P4XMDRT + RND 295 (Breast) IMPLANT
IMPL BRST P4XMDRT + RND 295CC (Breast) ×2 IMPLANT
IMPLANT BREAST GEL 295CC (Breast) ×6 IMPLANT
IV NS 1000ML (IV SOLUTION)
IV NS 1000ML BAXH (IV SOLUTION) IMPLANT
IV NS 500ML (IV SOLUTION)
IV NS 500ML BAXH (IV SOLUTION) IMPLANT
KIT FILL SYSTEM UNIVERSAL (SET/KITS/TRAYS/PACK) IMPLANT
NDL HYPO 25X1 1.5 SAFETY (NEEDLE) IMPLANT
NDL SAFETY ECLIPSE 18X1.5 (NEEDLE) ×1 IMPLANT
NEEDLE HYPO 18GX1.5 SHARP (NEEDLE) ×3
NEEDLE HYPO 25X1 1.5 SAFETY (NEEDLE) IMPLANT
PACK BASIN DAY SURGERY FS (CUSTOM PROCEDURE TRAY) ×3 IMPLANT
PENCIL BUTTON HOLSTER BLD 10FT (ELECTRODE) ×3 IMPLANT
PIN SAFETY STERILE (MISCELLANEOUS) IMPLANT
SIZER BREAST REUSE 295CC (SIZER) ×3
SIZER BRST REUSE 295CC 295CC (SIZER) ×1
SIZER BRST REUSE 295CC MGEL (SIZER) IMPLANT
SLEEVE SCD COMPRESS KNEE MED (MISCELLANEOUS) ×3 IMPLANT
SPONGE LAP 18X18 RF (DISPOSABLE) ×6 IMPLANT
STRIP SUTURE WOUND CLOSURE 1/2 (SUTURE) IMPLANT
SUT MNCRL AB 4-0 PS2 18 (SUTURE) ×3 IMPLANT
SUT MON AB 3-0 SH 27 (SUTURE) ×12
SUT MON AB 3-0 SH27 (SUTURE) ×4 IMPLANT
SUT MON AB 5-0 PS2 18 (SUTURE) ×3 IMPLANT
SUT PDS AB 2-0 CT2 27 (SUTURE) IMPLANT
SUT VIC AB 3-0 SH 27 (SUTURE)
SUT VIC AB 3-0 SH 27X BRD (SUTURE) IMPLANT
SUT VICRYL 4-0 PS2 18IN ABS (SUTURE) IMPLANT
SYR BULB IRRIGATION 50ML (SYRINGE) ×3 IMPLANT
SYR CONTROL 10ML LL (SYRINGE) ×2 IMPLANT
TOWEL GREEN STERILE FF (TOWEL DISPOSABLE) ×6 IMPLANT
TUBE CONNECTING 20'X1/4 (TUBING) ×1
TUBE CONNECTING 20X1/4 (TUBING) ×2 IMPLANT
UNDERPAD 30X36 HEAVY ABSORB (UNDERPADS AND DIAPERS) ×6 IMPLANT
YANKAUER SUCT BULB TIP NO VENT (SUCTIONS) ×3 IMPLANT

## 2018-12-30 NOTE — Anesthesia Preprocedure Evaluation (Addendum)
Anesthesia Evaluation  Patient identified by MRN, date of birth, ID band Patient awake    Reviewed: Allergy & Precautions, NPO status , Patient's Chart, lab work & pertinent test results  History of Anesthesia Complications Negative for: history of anesthetic complications  Airway Mallampati: II  TM Distance: >3 FB Neck ROM: Full    Dental no notable dental hx.    Pulmonary neg pulmonary ROS,    Pulmonary exam normal        Cardiovascular negative cardio ROS Normal cardiovascular exam     Neuro/Psych  Headaches, Anxiety Depression negative psych ROS   GI/Hepatic negative GI ROS, Neg liver ROS,   Endo/Other  negative endocrine ROS  Renal/GU negative Renal ROS     Musculoskeletal negative musculoskeletal ROS (+)   Abdominal   Peds  Hematology negative hematology ROS (+)   Anesthesia Other Findings S/p bilateral mastectomy  Reproductive/Obstetrics negative OB ROS                            Anesthesia Physical Anesthesia Plan  ASA: II  Anesthesia Plan: General   Post-op Pain Management:    Induction: Intravenous  PONV Risk Score and Plan: 4 or greater and Treatment may vary due to age or medical condition, Ondansetron, Dexamethasone, Midazolam and Scopolamine patch - Pre-op  Airway Management Planned: Oral ETT  Additional Equipment: None  Intra-op Plan:   Post-operative Plan: Extubation in OR  Informed Consent: I have reviewed the patients History and Physical, chart, labs and discussed the procedure including the risks, benefits and alternatives for the proposed anesthesia with the patient or authorized representative who has indicated his/her understanding and acceptance.     Dental advisory given  Plan Discussed with: CRNA  Anesthesia Plan Comments:       Anesthesia Quick Evaluation

## 2018-12-30 NOTE — Op Note (Addendum)
Op report Bilateral Exchange   DATE OF OPERATION: 12/30/2018  LOCATION: [facility]  SURGICAL DIVISION: Plastic Surgery  PREOPERATIVE DIAGNOSES:  1. Family history of breast cancer.  2. Acquired absence of bilateral breast.  3. Fibroadenomas  POSTOPERATIVE DIAGNOSES:  1. Family history of breast cancer.  2. Acquired absence of bilateral breast.  3. Fibroadenomas.  PROCEDURE:  1. Bilateral exchange of tissue expanders for implants.  2. Bilateral capsulotomies for implant respositioning.  SURGEON: Jatoria Kneeland Sanger Isola Mehlman, DO  ASSISTANT: Roetta Sessions, PA and [place], RNFa  ANESTHESIA:  General.   COMPLICATIONS: None.   IMPLANTS: Left - Mentor Smooth Moderate Plus Profile Xtra Gel 295 cc. Ref #[id number].  Serial Number [id number] - Mentor Smooth Moderate Plus Profile Xtra Gel 295 cc. Ref #[id number].  Serial Number [id number]  INDICATIONS FOR PROCEDURE:  The patient, Bethany [name], is a 43 y.o. female born on 1975/07/16, is here for treatment after bilateral mastectomies.  She had tissue expanders placed at the time of mastectomies. She now presents for exchange of her expanders for implants.  She requires capsulotomies to better position the implants. MRN: [id number]  CONSENT:  Informed consent was obtained directly from the patient. Risks, benefits and alternatives were fully discussed. Specific risks including but not limited to bleeding, infection, hematoma, seroma, scarring, pain, implant infection, implant extrusion, capsular contracture, asymmetry, wound healing problems, and need for further surgery were all discussed. The patient did have an ample opportunity to have her questions answered to her satisfaction.   DESCRIPTION OF PROCEDURE:  The patient was taken to the operating room. SCDs were placed and IV antibiotics were given. The patient's chest was prepped and draped in a sterile fashion. A time out was performed and the implants  to be used were identified.    On the right breast: One percent Lidocaine with epinephrine was used to infiltrate at the incision site. The old mastectomy scar was excised.  The mastectomy flaps from the superior and inferior flaps were raised over the pectoralis major muscle for several centimeters to minimize tension for the closure. The pectoralis was split inferior to the skin incision to expose and remove the tissue expander.  Inspection of the pocket showed a normal healthy capsule and good integration of the biologic matrix.  The pocket was irrigated with antibiotic solution.  Circumferential capsulotomies were performed to allow for breast pocket expansion.  Measurements were made and a sizer used to confirm adequate pocket size for the implant dimensions.  Hemostasis was ensured with electrocautery. New gloves were placed. The implant was soaked in antibiotic solution and then placed in the pocket and oriented appropriately. The pectoralis major muscle and capsule on the anterior surface were re-closed with a 3-0 Monocryl suture. The remaining skin was closed with 4-0 Monocryl deep dermal and 5-0 Monocryl subcuticular stitches.   On the left breast: The old mastectomy scar was excised.  The mastectomy flaps from the superior and inferior flaps were raised over the pectoralis major muscle for several centimeters to minimize tension for the closure. The pectoralis was split inferior to the skin incision to expose and remove the tissue expander.  Inspection of the pocket showed a normal healthy capsule and good integration of the biologic matrix.   Circumferential capsulotomies were performed to allow for breast pocket expansion.  Measurements were made and a sizer utilized to confirm adequate pocket size for the implant dimensions.  Hemostasis was ensured with the electrocautery.  New gloves were applied. The implant  was soaked in antibiotic solution and placed in the pocket and oriented  appropriately. The pectoralis major muscle and capsule on the anterior surface were re-closed with a 3-0 Monocryl suture. The remaining skin was closed with 4-0 Monocryl deep dermal and 5-0 Monocryl subcuticular stitches.  Dermabond was applied to the incision site. A breast binder and ABDs were placed.  The patient was allowed to wake from anesthesia and taken to the recovery room in satisfactory condition.   The advanced practice practitioner (APP) assisted throughout the case.  The APP was essential in retraction and counter traction when needed to make the case progress smoothly.  This retraction and assistance made it possible to see the tissue plans for the procedure.  The assistance was needed for blood control, tissue re-approximation and assisted with closure of the incision site.

## 2018-12-30 NOTE — Anesthesia Procedure Notes (Signed)
Procedure Name: Intubation Date/Time: 12/30/2018 2:32 PM Performed by: Lavonia Dana, CRNA Pre-anesthesia Checklist: Patient identified, Emergency Drugs available, Suction available and Patient being monitored Patient Re-evaluated:Patient Re-evaluated prior to induction Oxygen Delivery Method: Circle system utilized Preoxygenation: Pre-oxygenation with 100% oxygen Induction Type: IV induction Ventilation: Mask ventilation without difficulty Laryngoscope Size: Miller and 2 Grade View: Grade I Tube type: Oral Tube size: 7.0 mm Number of attempts: 1 Airway Equipment and Method: Stylet and Oral airway Placement Confirmation: ETT inserted through vocal cords under direct vision,  positive ETCO2 and breath sounds checked- equal and bilateral Secured at: 22 cm Tube secured with: Tape Dental Injury: Teeth and Oropharynx as per pre-operative assessment

## 2018-12-30 NOTE — Transfer of Care (Signed)
Immediate Anesthesia Transfer of Care Note  Patient: Bethany [name]  Procedure(s) Performed: REMOVAL OF BILATERAL TISSUE EXPANDERS WITH PLACEMENT OF BILATERAL BREAST IMPLANTS (Bilateral Breast)  Patient Location: PACU  Anesthesia Type:General  Level of Consciousness: awake, alert  and oriented  Airway & Oxygen Therapy: Patient Spontanous Breathing and Patient connected to face mask oxygen  Post-op Assessment: Report given to RN and Post -op Vital signs reviewed and stable  Post vital signs: Reviewed and stable  Last Vitals:  Vitals Value Taken Time  BP 127/69 12/30/18 1606  Temp    Pulse 104 12/30/18 1608  Resp 16 12/30/18 1608  SpO2 100 % 12/30/18 1608  Vitals shown include unvalidated device data.  Last Pain:  Vitals:   12/30/18 1123  TempSrc: Oral      Patients Stated Pain Goal: 4 ([id number])  Complications: No apparent anesthesia complications

## 2018-12-30 NOTE — Anesthesia Postprocedure Evaluation (Signed)
Anesthesia Post Note  Patient: Baylei Speakes  Procedure(s) Performed: REMOVAL OF BILATERAL TISSUE EXPANDERS WITH PLACEMENT OF BILATERAL BREAST IMPLANTS (Bilateral Breast)     Patient location during evaluation: PACU Anesthesia Type: General Level of consciousness: awake and alert and oriented Pain management: pain level controlled Vital Signs Assessment: post-procedure vital signs reviewed and stable Respiratory status: spontaneous breathing, nonlabored ventilation and respiratory function stable Cardiovascular status: blood pressure returned to baseline Postop Assessment: no apparent nausea or vomiting Anesthetic complications: no    Last Vitals:  Vitals:   12/30/18 1607 12/30/18 1615  BP:  121/69  Pulse: (!) 103 91  Resp: 11 17  Temp: 36.4 C   SpO2: 100% 100%    Last Pain:  Vitals:   12/30/18 1123  TempSrc: Oral                 Brennan Bailey

## 2018-12-30 NOTE — Interval H&P Note (Signed)
History and Physical Interval Note:  12/30/2018 1:32 PM  Bethany [name]  has presented today for surgery, with the diagnosis of Acquired absence of breast, bilateral.  The various methods of treatment have been discussed with the patient and family. After consideration of risks, benefits and other options for treatment, the patient has consented to  Procedure(s): REMOVAL OF BILATERAL TISSUE EXPANDERS WITH PLACEMENT OF BILATERAL BREAST IMPLANTS (Bilateral) as a surgical intervention.  The patient's history has been reviewed, patient examined, no change in status, stable for surgery.  I have reviewed the patient's chart and labs.  Questions were answered to the patient's satisfaction.     Loel Lofty Kennth Vanbenschoten

## 2018-12-30 NOTE — Discharge Instructions (Signed)
°Post Anesthesia Home Care Instructions ° °Activity: °Get plenty of rest for the remainder of the day. A responsible individual must stay with you for 24 hours following the procedure.  °For the next 24 hours, DO NOT: °-Drive a car °-Operate machinery °-Drink alcoholic beverages °-Take any medication unless instructed by your physician °-Make any legal decisions or sign important papers. ° °Meals: °Start with liquid foods such as gelatin or soup. Progress to regular foods as tolerated. Avoid greasy, spicy, heavy foods. If nausea and/or vomiting occur, drink only clear liquids until the nausea and/or vomiting subsides. Call your physician if vomiting continues. ° °Special Instructions/Symptoms: °Your throat may feel dry or sore from the anesthesia or the breathing tube placed in your throat during surgery. If this causes discomfort, gargle with warm salt water. The discomfort should disappear within 24 hours. ° °If you had a scopolamine patch placed behind your ear for the management of post- operative nausea and/or vomiting: ° °1. The medication in the patch is effective for 72 hours, after which it should be removed.  Wrap patch in a tissue and discard in the trash. Wash hands thoroughly with soap and water. °2. You may remove the patch earlier than 72 hours if you experience unpleasant side effects which may include dry mouth, dizziness or visual disturbances. °3. Avoid touching the patch. Wash your hands with soap and water after contact with the patch. °   ° ° °INSTRUCTIONS FOR AFTER SURGERY ° ° °You are having surgery.  You will likely have some questions about what to expect following your operation.  The following information will help you and your family understand what to expect when you are discharged from the hospital.  Following these guidelines will help ensure a smooth recovery and reduce risks of complications.   °Postoperative instructions include information on: diet, wound care, medications and  physical activity. ° °AFTER SURGERY °Expect to go home after the procedure.  In some cases, you may need to spend one night in the hospital for observation. ° °DIET °This surgery does not require a specific diet.  However, I have to mention that the healthier you eat the better your body can start healing. It is important to increasing your protein intake.  This means limiting the foods with sugar and carbohydrates.  Focus on vegetables and some meat.  If you have any liposuction during your procedure be sure to drink water.  If your urine is bright yellow, then it is concentrated, and you need to drink more water.  As a general rule after surgery, you should have 8 ounces of water every hour while awake.  If you find you are persistently nauseated or unable to take in liquids let us know.  NO TOBACCO USE or EXPOSURE.  This will slow your healing process and increase the risk of a wound. ° °WOUND CARE °If you don't have a drain: You can shower the day after surgery.  Use fragrance free soap.  Dial, Dove and Ivory are usually mild on the skin.  If you have steri-strips / tape directly attached to your skin leave them in place. It is OK to get these wet.  No baths, pools or hot tubs for two weeks. °We close your incision to leave the smallest and best-looking scar. No ointment or creams on your incisions until given the go ahead.  Especially not Neosporin (Too many skin reactions with this one).  A few weeks after surgery you can use Mederma and start massaging   the scar. °We ask you to wear your binder or sports bra for the first 6 weeks around the clock, including while sleeping. This provides added comfort and helps reduce the fluid accumulation at the surgery site. ° °ACTIVITY °No heavy lifting until cleared by the doctor.  It is OK to walk and climb stairs. In fact, moving your legs is very important to decrease your risk of a blood clot.  It will also help keep you from getting deconditioned.  Every 1 to 2 hours  get up and walk for 5 minutes. This will help with a quicker recovery back to normal.  Let pain be your guide so you don't do too much.  NO, you cannot do the spring cleaning and don't plan on taking care of anyone else.  This is your time for TLC.  °You will be more comfortable if you sleep and rest with your head elevated either with a few pillows under you or in a recliner.  No stomach sleeping for a few months. ° °WORK °Everyone returns to work at different times. As a rough guide, most people take at least 1 - 2 weeks off prior to returning to work. If you need documentation for your job, bring the forms to your postoperative follow up visit. ° °DRIVING °Arrange for someone to bring you home from the hospital.  You may be able to drive a few days after surgery but not while taking any narcotics or valium. ° °BOWEL MOVEMENTS °Constipation can occur after anesthesia and while taking pain medication.  It is important to stay ahead for your comfort.  We recommend taking Milk of Magnesia (2 tablespoons; twice a day) while taking the pain pills. ° °SEROMA °This is fluid your body tried to put in the surgical site.  This is normal but if it creates tight skinny skin let us know.  It usually decreases in a few weeks. ° °WHEN TO CALL °Call your surgeon's office if any of the following occur: °• Fever 101 degrees F or greater °• Excessive bleeding or fluid from the incision site. °• Pain that increases over time without aid from the medications °• Redness, warmth, or pus draining from incision sites °• Persistent nausea or inability to take in liquids °• Severe misshapen area that underwent the operation. ° ° ° °

## 2018-12-31 ENCOUNTER — Other Ambulatory Visit (HOSPITAL_COMMUNITY): Payer: Self-pay | Admitting: Psychiatry

## 2018-12-31 ENCOUNTER — Encounter (HOSPITAL_BASED_OUTPATIENT_CLINIC_OR_DEPARTMENT_OTHER): Payer: Self-pay | Admitting: Plastic Surgery

## 2018-12-31 DIAGNOSIS — F411 Generalized anxiety disorder: Secondary | ICD-10-CM

## 2019-01-07 ENCOUNTER — Telehealth: Payer: Self-pay

## 2019-01-07 NOTE — Telephone Encounter (Signed)

## 2019-01-08 ENCOUNTER — Other Ambulatory Visit: Payer: Self-pay

## 2019-01-08 ENCOUNTER — Encounter: Payer: Self-pay | Admitting: Surgical

## 2019-01-08 ENCOUNTER — Ambulatory Visit (INDEPENDENT_AMBULATORY_CARE_PROVIDER_SITE_OTHER): Payer: PRIVATE HEALTH INSURANCE | Admitting: Surgical

## 2019-01-08 VITALS — BP 103/71 | HR 91 | Temp 97.1°F | Ht 64.0 in | Wt 132.0 lb

## 2019-01-08 DIAGNOSIS — Z9013 Acquired absence of bilateral breasts and nipples: Secondary | ICD-10-CM

## 2019-01-08 NOTE — Progress Notes (Signed)
   Subjective:     Patient ID: Bethany [name], female    DOB: [birth year], 43 y.o.   MRN: [id number]  Chief Complaint  Patient presents with  . Post-op Follow-up    for removal of (B) breast expanders w/placement of silicone implant    HPI: The patient is a 43 y.o. female here for follow-up after removal of bilateral tissue expanders with placement of bilateral breast implants on December 30, 2018 with Dr. Marla Roe.  She is here for one-week follow-up.  The patient had [place] smooth moderate plus profile extra gel 295 cc implants placed bilaterally.  She is doing well. She has been wearing her binder at night and sports bra throughout the day.  No fever, chills, n/v. She has been eating healthy, drinking plenty of water.  Incisions are c/d/i. No sign of infection, seroma, hematoma. Right superior incision is slightly overlapping the inferior aspect, but there is no dehiscence and it is healing well.   Review of Systems  Constitutional: Negative for chills, diaphoresis, fever, malaise/fatigue and weight loss.  Respiratory: Negative for shortness of breath.   Cardiovascular: Negative for chest pain.  Gastrointestinal: Negative.   Genitourinary: Negative.   Musculoskeletal: Positive for myalgias.  Skin: Negative for itching and rash.  Neurological: Positive for sensory change. Negative for dizziness and weakness.  Psychiatric/Behavioral: Negative.     Objective:   Vital Signs BP 103/71 (BP Location: Left Arm, Patient Position: Sitting, Cuff Size: Normal)   Pulse 91   Temp (!) 97.1 F (36.2 C) (Temporal)   Ht 5\' 4"  (1.626 m)   Wt 132 lb (59.9 kg)   SpO2 100%   BMI 22.66 kg/m  Vital Signs and Nursing Note Reviewed Chaperone Physical Exam  Constitutional: She is oriented to person, place, and time and well-developed, well-nourished, and in no distress.  HENT:  Head: Normocephalic and atraumatic.  Cardiovascular: Normal rate.  Pulmonary/Chest: Effort normal.    Incision  c/d/i. No erythema or dehiscence noted. Dermabond in place   Musculoskeletal: Normal range of motion.  Neurological: She is alert and oriented to person, place, and time. Gait normal.  Skin: Skin is warm and dry. No rash noted. She is not diaphoretic. No erythema. No pallor.  Psychiatric: Mood and affect normal.      Assessment/Plan:     ICD-10-CM   1. S/P mastectomy, bilateral  Z90.13   2. Acquired absence of breast, bilateral  Z90.13    Mrs. Bethany [name] is doing well overall.  She is approximately 1 week postop from expander to implant exchange.  Her incisions are healing well.  No sign of infection, seroma, hematoma.  Continue to wear sports bra throughout day and night. Breast binder for compression at night if sports bra is not providing enough compression.  Continue to eat healthy, high-protein low sugar and carbs, drink plenty of water.  Follow up in 2 weeks  Carola Rhine Amour Trigg, PA-C 01/08/2019, 9:12 AM

## 2019-01-15 ENCOUNTER — Telehealth: Payer: Self-pay | Admitting: Plastic Surgery

## 2019-01-15 MED ORDER — DIAZEPAM 2 MG PO TABS: 2.0000 mg | ORAL_TABLET | Freq: Two times a day (BID) | ORAL | 0 refills | Status: AC | PRN

## 2019-01-15 MED ORDER — HYDROCODONE-ACETAMINOPHEN 5-325 MG PO TABS: 1.0000 | ORAL_TABLET | Freq: Every evening | ORAL | 0 refills | Status: AC | PRN

## 2019-01-15 NOTE — Telephone Encounter (Signed)
Called and spoke with the patient regarding the message below. Informed the patient that Dr. Marla Roe will be sending in [place] and Valium to the pharmacy for her to take at night, and she can Tylenol or Motrin during the day.  Patient verbalized understanding and agreed.//AB/CMA

## 2019-01-15 NOTE — Addendum Note (Signed)
Addended by: Wallace Going on: 01/15/2019 05:26 PM   Modules accepted: Orders

## 2019-01-15 NOTE — Telephone Encounter (Signed)
Patient called in because she is experiencing soreness after implant placement surgery on 12/30/18. Wants to know what she can take. Please call patient to advise.

## 2019-01-18 ENCOUNTER — Telehealth: Payer: Self-pay

## 2019-01-18 NOTE — Telephone Encounter (Signed)

## 2019-01-19 ENCOUNTER — Ambulatory Visit (INDEPENDENT_AMBULATORY_CARE_PROVIDER_SITE_OTHER): Payer: PRIVATE HEALTH INSURANCE | Admitting: Surgical

## 2019-01-19 ENCOUNTER — Encounter: Payer: Self-pay | Admitting: Plastic Surgery

## 2019-01-19 ENCOUNTER — Other Ambulatory Visit: Payer: Self-pay

## 2019-01-19 ENCOUNTER — Encounter: Payer: Self-pay | Admitting: Surgical

## 2019-01-19 VITALS — BP 106/67 | HR 78 | Temp 97.5°F | Wt 134.4 lb

## 2019-01-19 DIAGNOSIS — Z9013 Acquired absence of bilateral breasts and nipples: Secondary | ICD-10-CM

## 2019-01-19 NOTE — Progress Notes (Signed)
   Subjective:     Patient ID: Bethany [name], female    DOB: [birth year], 43 y.o.   MRN: [id number]   Chief Complaint  Patient presents with  . Follow-up    HPI: The patient is a 43 y.o. female here for follow-up after removal of bilateral tissue expanders and placement of bilateral breast implants on 12/30/2018.  She is approximately 3 weeks postop.  She is doing well today.   Her incisions are healing well. They are c/d/i, no dehiscence noted. No erythema, sign of infection, seroma/hematoma.   She reports some left lateral axillary/breast pain that is intermittent and TTP.  No fevers, chills, n/v. She is very pleased with her size. She works as a Quarry manager at a nursing home and was planning to return to work for [employer] duty, but they informed her there was no possibility for light duty at her job.   Review of Systems  Constitutional: Negative for chills, diaphoresis, fever, malaise/fatigue and weight loss.  Respiratory: Negative.   Cardiovascular: Negative.   Gastrointestinal: Negative.   Genitourinary: Negative.   Musculoskeletal: Positive for myalgias (left breast tenderness).  Skin: Negative for itching and rash.  Neurological: Positive for sensory change. Negative for dizziness, tingling, weakness and headaches.    Objective:   Vital Signs There were no vitals taken for this visit. Vital Signs and Nursing Note Reviewed Chaperone present Physical Exam  Constitutional: She is oriented to person, place, and time and well-developed, well-nourished, and in no distress.  HENT:  Head: Normocephalic and atraumatic.  Cardiovascular: Normal rate.  Pulmonary/Chest: Effort normal.    Musculoskeletal: Normal range of motion.  Neurological: She is alert and oriented to person, place, and time. Gait normal.  Skin: Skin is warm and dry. No rash noted. She is not diaphoretic. No erythema. No pallor.  Psychiatric: Mood and affect normal.    Assessment/Plan:     ICD-10-CM   1. S/P  mastectomy, bilateral  Z90.13   2. Acquired absence of breast, bilateral  Z90.13    Continue wearing sports bra. Multivitamin, vitamin C, healthy diet (low carbs/sugar, high protein). Avoid heavy lifting at this time. She is healing well.   She was scheduled to return to work October 16th, but due to inability for light duty, I believe it would be best for patient to stay out of work until late October. 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  Follow up in 2 weeks.  Carola Rhine Nayvie Lips, PA-C 01/19/2019, 8:59 AM

## 2019-02-01 ENCOUNTER — Telehealth: Payer: Self-pay

## 2019-02-01 NOTE — Telephone Encounter (Signed)

## 2019-02-02 ENCOUNTER — Other Ambulatory Visit: Payer: Self-pay

## 2019-02-02 ENCOUNTER — Encounter (HOSPITAL_COMMUNITY): Payer: Self-pay | Admitting: Psychiatry

## 2019-02-02 ENCOUNTER — Encounter: Payer: Self-pay | Admitting: Surgical

## 2019-02-02 ENCOUNTER — Ambulatory Visit (INDEPENDENT_AMBULATORY_CARE_PROVIDER_SITE_OTHER): Payer: PRIVATE HEALTH INSURANCE | Admitting: Psychiatry

## 2019-02-02 ENCOUNTER — Encounter: Payer: Self-pay | Admitting: Plastic Surgery

## 2019-02-02 ENCOUNTER — Ambulatory Visit (INDEPENDENT_AMBULATORY_CARE_PROVIDER_SITE_OTHER): Payer: PRIVATE HEALTH INSURANCE | Admitting: Surgical

## 2019-02-02 VITALS — BP 117/80 | HR 75 | Temp 96.4°F | Ht 64.0 in | Wt 131.2 lb

## 2019-02-02 DIAGNOSIS — F411 Generalized anxiety disorder: Secondary | ICD-10-CM

## 2019-02-02 DIAGNOSIS — F3160 Bipolar disorder, current episode mixed, unspecified: Secondary | ICD-10-CM | POA: Diagnosis not present

## 2019-02-02 DIAGNOSIS — Z9013 Acquired absence of bilateral breasts and nipples: Secondary | ICD-10-CM

## 2019-02-02 MED ORDER — HYDROXYZINE PAMOATE 25 MG PO CAPS: 25.0000 mg | ORAL_CAPSULE | Freq: Two times a day (BID) | ORAL | 0 refills | Status: DC | PRN

## 2019-02-02 MED ORDER — SERTRALINE HCL 50 MG PO TABS: 50.0000 mg | ORAL_TABLET | Freq: Every day | ORAL | 0 refills | Status: DC

## 2019-02-02 MED ORDER — ARIPIPRAZOLE 20 MG PO TABS: 20.0000 mg | ORAL_TABLET | Freq: Every day | ORAL | 0 refills | Status: DC

## 2019-02-02 NOTE — Progress Notes (Signed)
Virtual Visit via Telephone Note  I connected with Bethany [name] on 02/02/19 at  2:20 PM EDT by telephone and verified that I am speaking with the correct person using two identifiers.   I discussed the limitations, risks, security and privacy concerns of performing an evaluation and management service by telephone and the availability of in person appointments. I also discussed with the patient that there may be a patient responsible charge related to this service. The patient expressed understanding and agreed to proceed.   History of Present Illness: Patient was evaluated by phone session.  On her last visit we increase her Zoloft to 50 mg and added hydroxyzine 25 mg twice a day.  She is feeling much better.  She is sleeping good.  She still have some anxiety but it is not as bad.  She is scheduled to start work tomorrow and she is looking forward to.  Since increase the Zoloft she is not having any crying spells.  She is sleeping better.  She has a good support from her daughter and her parents.  She is still thinking about divorce from her husband who is not helpful.  She is tolerating her medication very well and denies any tremors shakes or any EPS.  She denies any paranoia, hallucination, crying spells or any active or passive suicidal thoughts or homicidal thought.  She like to continue her current medication.  She denies drinking or using any illegal substances.  Energy level is good.  She denies any mania or any impulsive behavior.   Past Psychiatric History:Reviewed. 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 TookZoloft and lorazepam byPCP.Noh/oinpatient treatment, suicidal attempt, psychosis or any mania. H/Oirritability, anger and mood swing. Tried Lamictal but stopped due to rash.  Psychiatric Specialty Exam: Physical Exam  ROS  There were no vitals taken for this visit.There is no height or weight on file to calculate BMI.  General Appearance: NA  Eye  Contact:  NA  Speech:  Clear and Coherent and Slow  Volume:  Normal  Mood:  Euthymic  Affect:  NA  Thought Process:  Goal Directed  Orientation:  Full (Time, Place, and Person)  Thought Content:  WDL and Logical  Suicidal Thoughts:  No  Homicidal Thoughts:  No  Memory:  Immediate;   Good Recent;   Good Remote;   Good  Judgement:  Good  Insight:  Good  Psychomotor Activity:  NA  Concentration:  Concentration: Good and Attention Span: Good  Recall:  Good  Fund of Knowledge:  Good  Language:  Good  Akathisia:  No  Handed:  Right  AIMS (if indicated):     Assets:  Communication Skills Desire for Improvement Housing Resilience  ADL's:  Intact  Cognition:  WNL  Sleep:        Assessment and Plan: Bipolar disorder type I.  Generalized anxiety disorder.  Patient doing better since medicine dose adjusted.  Continue Zoloft 50 mg daily, hydroxyzine 25 mg twice a day as needed for anxiety and Abilify 20 mg daily.  Discussed medication side effects and benefits.  Recommended to call us back if she has any question of any concern.  Follow-up in 3 months.  I discussed if she ready then she can start therapy with Romelle Starcher who she used to see her before but needed some time to address her health issues.  Patient promised that she will call if she decided to return for therapy.  Patient was evaluated by phone session.  She is doing better  on her medication.  She still drinks beer 1-2 every night  Follow Up Instructions:    I discussed the assessment and treatment plan with the patient. The patient was provided an opportunity to ask questions and all were answered. The patient agreed with the plan and demonstrated an understanding of the instructions.   The patient was advised to call back or seek an in-person evaluation if the symptoms worsen or if the condition fails to improve as anticipated.  I provided 20 minutes of non-face-to-face time during this encounter.   [name],  MD

## 2019-02-02 NOTE — Progress Notes (Signed)
   Subjective:     Patient ID: Bethany [name], female    DOB: [birth year], 43 y.o.   MRN: [id number]  Chief Complaint  Patient presents with  . Follow-up    2 weeks after removal of (B) tissue expanders and  placement of (B) implants    HPI: The patient is a 43 y.o. female here for follow-up after removal of bilateral tissue expanders and placement of bilateral breast implants on 12/30/18. She is 1 month post-op.  She is doing extremely well and is very pleased with her progress. She is returning to work November 1st. She works as a Quarry manager.   No complaints. No pain. Incisions c/d/i, no dehiscence. Dermabond flaking off  Review of Systems  Constitutional: Negative for chills, diaphoresis, fever, malaise/fatigue and weight loss.  Respiratory: Negative for cough, sputum production and shortness of breath.   Cardiovascular: Negative for chest pain and palpitations.  Gastrointestinal: Negative for abdominal pain, diarrhea, nausea and vomiting.  Genitourinary: Negative for dysuria and urgency.  Musculoskeletal: Negative for back pain, myalgias and neck pain.  Skin: Negative for itching and rash.  Neurological: Negative for dizziness, focal weakness, weakness and headaches.    Objective:   Vital Signs BP 117/80 (BP Location: Left Arm, Patient Position: Sitting, Cuff Size: Normal)   Pulse 75   Temp (!) 96.4 F (35.8 C) (Temporal)   Ht 5\' 4"  (1.626 m)   Wt 131 lb 3.2 oz (59.5 kg)   SpO2 99%   BMI 22.52 kg/m  Vital Signs and Nursing Note Reviewed Chaperone present Physical Exam  Constitutional: She is oriented to person, place, and time and well-developed, well-nourished, and in no distress.  HENT:  Head: Normocephalic and atraumatic.  Cardiovascular: Normal rate.  Pulmonary/Chest: Effort normal.    Musculoskeletal: Normal range of motion.  Neurological: She is alert and oriented to person, place, and time. Gait normal.  Skin: Skin is warm and dry. No rash noted. She is not  diaphoretic. No erythema. No pallor.  Psychiatric: Mood and affect normal.      Assessment/Plan:     ICD-10-CM   1. S/P mastectomy, bilateral  Z90.13     Mrs. Bethany [name] is doing really well. Her incisions have healed nicely and she is happywith her progress and size.   She is returning to work November 1st.  Follow up in 3 months, call with questions/concerns prior to then.  Pictures were obtained of the patient and placed in the chart with the patient's or guardian's permission.   Carola Rhine Traveon Louro, PA-C 02/02/2019, 8:52 AM

## 2019-02-04 ENCOUNTER — Other Ambulatory Visit (HOSPITAL_COMMUNITY): Payer: Self-pay | Admitting: Psychiatry

## 2019-02-04 DIAGNOSIS — F411 Generalized anxiety disorder: Secondary | ICD-10-CM

## 2019-02-16 ENCOUNTER — Other Ambulatory Visit (HOSPITAL_COMMUNITY): Payer: Self-pay | Admitting: Psychiatry

## 2019-02-16 DIAGNOSIS — F411 Generalized anxiety disorder: Secondary | ICD-10-CM

## 2019-05-05 ENCOUNTER — Other Ambulatory Visit: Payer: Self-pay

## 2019-05-05 ENCOUNTER — Encounter (HOSPITAL_COMMUNITY): Payer: Self-pay | Admitting: Psychiatry

## 2019-05-05 ENCOUNTER — Telehealth: Payer: Self-pay | Admitting: Surgical

## 2019-05-05 ENCOUNTER — Ambulatory Visit (INDEPENDENT_AMBULATORY_CARE_PROVIDER_SITE_OTHER): Payer: Self-pay | Admitting: Psychiatry

## 2019-05-05 DIAGNOSIS — F411 Generalized anxiety disorder: Secondary | ICD-10-CM

## 2019-05-05 DIAGNOSIS — F3160 Bipolar disorder, current episode mixed, unspecified: Secondary | ICD-10-CM

## 2019-05-05 MED ORDER — SERTRALINE HCL 50 MG PO TABS: 50.0000 mg | ORAL_TABLET | Freq: Every day | ORAL | 0 refills | Status: DC

## 2019-05-05 MED ORDER — ARIPIPRAZOLE 20 MG PO TABS: 20.0000 mg | ORAL_TABLET | Freq: Every day | ORAL | 0 refills | Status: DC

## 2019-05-05 NOTE — Progress Notes (Signed)
Virtual Visit via Telephone Note  I connected with Bethany [name] on 05/05/19 at  2:20 PM EST by telephone and verified that I am speaking with the correct person using two identifiers.   I discussed the limitations, risks, security and privacy concerns of performing an evaluation and management service by telephone and the availability of in person appointments. I also discussed with the patient that there may be a patient responsible charge related to this service. The patient expressed understanding and agreed to proceed.   History of Present Illness: Patient was evaluated by phone session.  She is now taking Zoloft 50 mg and doing very well.  She denies any irritability, anger or any mood swing.  She is more relaxed and calm since she took the divorce from her husband.  She had a good Christmas.  She also got a new job at [employer] and that is going very well.  She is sleeping good.  She rarely takes hydroxyzine and does not need a new prescription.  She denies any highs or lows, mania, anger or any feeling of hopelessness or suicidal thoughts.  Her energy level is good.  Her appetite is okay.  Her weight is a stable.  Past Psychiatric History:Reviewed. 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 TookZoloft and lorazepam byPCP.Noh/oinpatient treatment, suicidal attempt, psychosis or any mania. H/Oirritability, anger and mood swing. Tried Lamictal but stopped due to rash.   Psychiatric Specialty Exam: Physical Exam  Review of Systems  There were no vitals taken for this visit.There is no height or weight on file to calculate BMI.  General Appearance: NA  Eye Contact:  NA  Speech:  Clear and Coherent  Volume:  Normal  Mood:  Euthymic  Affect:  NA  Thought Process:  Goal Directed  Orientation:  Full (Time, Place, and Person)  Thought Content:  WDL  Suicidal Thoughts:  No  Homicidal Thoughts:  No  Memory:  Immediate;   Good Recent;    Good Remote;   Good  Judgement:  Good  Insight:  Present  Psychomotor Activity:  NA  Concentration:  Concentration: Good and Attention Span: Good  Recall:  Good  Fund of Knowledge:  Good  Language:  Good  Akathisia:  No  Handed:  Right  AIMS (if indicated):     Assets:  Communication Skills Desire for Improvement Housing Resilience Social Support  ADL's:  Intact  Cognition:  WNL  Sleep:   ok      Assessment and Plan: Bipolar disorder type I.  Generalized anxiety disorder.  Patient doing much better on her current medication.  She is no longer taking hydroxyzine and sleeping better.  Continue Zoloft 50 mg daily, Abilify 20 mg daily.  Discussed medication side effects and benefits.  Recommended to call us back if she has any question of any concern.  She does not feel she needs a therapist at this time.  Follow-up in 3 months.  Follow Up Instructions:    I discussed the assessment and treatment plan with the patient. The patient was provided an opportunity to ask questions and all were answered. The patient agreed with the plan and demonstrated an understanding of the instructions.   The patient was advised to call back or seek an in-person evaluation if the symptoms worsen or if the condition fails to improve as anticipated.  I provided 20 minutes of non-face-to-face time during this encounter.   [name], MD

## 2019-05-05 NOTE — Telephone Encounter (Signed)

## 2019-05-06 ENCOUNTER — Encounter: Payer: Self-pay | Admitting: Surgical

## 2019-05-06 ENCOUNTER — Other Ambulatory Visit: Payer: Self-pay

## 2019-05-06 ENCOUNTER — Ambulatory Visit: Payer: PRIVATE HEALTH INSURANCE | Admitting: Surgical

## 2019-05-06 VITALS — BP 103/74 | HR 69 | Temp 97.8°F | Ht 64.0 in | Wt 131.4 lb

## 2019-05-06 DIAGNOSIS — Z9013 Acquired absence of bilateral breasts and nipples: Secondary | ICD-10-CM

## 2019-05-06 NOTE — Progress Notes (Signed)
Ms. Bethany [name] is a 44 year old female here for follow-up after removal of expanders and placement of silicone implants on [id number].  She is doing really well.  She is very happy with the size of her implants and overall outcome.  She has not had any issues since her last visit 3 months ago.  Her incisions are well-healed. No complaints.  BP 103/74 (BP Location: Left Arm, Patient Position: Sitting, Cuff Size: Normal)   Pulse 69   Temp 97.8 F (36.6 C) (Temporal)   Ht 5\' 4"  (1.626 m)   Wt 131 lb 6.4 oz (59.6 kg)   SpO2 100%   BMI 22.55 kg/m  Chaperone present  General: NAD, Alert Pulmonary: Unlabored breathing. Breasts: Bilateral incisions are C/D/I, well-healed.  Breasts are symmetric. No erythema, no swelling.  Plan: Recommend follow-up in 1 year.  Recommend using Mederma cream for scars if she would like.  This is not necessary, but may help with scarring.  Call with any questions or concerns prior to her 1 year follow-up.

## 2019-05-17 ENCOUNTER — Encounter (HOSPITAL_COMMUNITY): Payer: Self-pay | Admitting: Emergency Medicine

## 2019-05-17 ENCOUNTER — Other Ambulatory Visit: Payer: Self-pay

## 2019-05-17 ENCOUNTER — Emergency Department (HOSPITAL_COMMUNITY)
Admission: EM | Admit: 2019-05-17 | Discharge: 2019-05-17 | Disposition: A | Discharge status: Home / Self Care | Payer: Self-pay | Attending: Emergency Medicine | Admitting: Emergency Medicine

## 2019-05-17 ENCOUNTER — Telehealth (INDEPENDENT_AMBULATORY_CARE_PROVIDER_SITE_OTHER): Payer: Self-pay | Admitting: Family Medicine

## 2019-05-17 DIAGNOSIS — Z79899 Other long term (current) drug therapy: Secondary | ICD-10-CM | POA: Insufficient documentation

## 2019-05-17 DIAGNOSIS — U071 COVID-19: Secondary | ICD-10-CM

## 2019-05-17 HISTORY — DX: COVID-19: U07.1

## 2019-05-17 LAB — CBC
HCT: 41.5 % (ref 36.0–46.0)
Hemoglobin: 13.3 g/dL (ref 12.0–15.0)
MCH: 28.8 pg (ref 26.0–34.0)
MCHC: 32 g/dL (ref 30.0–36.0)
MCV: 89.8 fL (ref 80.0–100.0)
Platelets: 192 10*3/uL (ref 150–400)
RBC: 4.62 MIL/uL (ref 3.87–5.11)
RDW: 14.6 % (ref 11.5–15.5)
WBC: 5.8 10*3/uL (ref 4.0–10.5)
nRBC: 0 % (ref 0.0–0.2)

## 2019-05-17 LAB — BASIC METABOLIC PANEL
Anion gap: 14 (ref 5–15)
BUN: 14 mg/dL (ref 6–20)
CO2: 21 mmol/L — ABNORMAL LOW (ref 22–32)
Calcium: 9 mg/dL (ref 8.9–10.3)
Chloride: 104 mmol/L (ref 98–111)
Creatinine, Ser: 1.06 mg/dL — ABNORMAL HIGH (ref 0.44–1.00)
GFR calc Af Amer: >60 mL/min (ref >60)
GFR calc non Af Amer: >60 mL/min (ref >60)
Glucose, Bld: 126 mg/dL — ABNORMAL HIGH (ref 70–99)
Potassium: 3.8 mmol/L (ref 3.5–5.1)
Sodium: 139 mmol/L (ref 135–145)

## 2019-05-17 LAB — TROPONIN I (HIGH SENSITIVITY)
Troponin I (High Sensitivity): <2 ng/L (ref <18)
Troponin I (High Sensitivity): <2 ng/L (ref <18)

## 2019-05-17 LAB — I-STAT BETA HCG BLOOD, ED (MC, WL, AP ONLY): I-stat hCG, quantitative: <5 m[IU]/mL (ref <5)

## 2019-05-17 MED ORDER — SODIUM CHLORIDE 0.9% FLUSH: 3.0000 mL | Freq: Once | INTRAVENOUS | Status: DC

## 2019-05-17 NOTE — ED Provider Notes (Signed)
[facility] EMERGENCY DEPARTMENT Provider Note   CSN: [id number] Arrival date & time: 05/17/19  1134     History Chief Complaint  Patient presents with  . COVID +  . Shortness of Breath  . Chest Pain    Bethany [name] is a 44 y.o. female.  HPI She presents for evaluation of general weakness, nausea and fever.  She has been ill for several days.  She tested positive for COVID-19 infection, 3 days ago.  He had gone to get testing because she had had a headache.  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  She denies shortness of breath, cough, chest pain or paresthesias.  There are no other known modifying factors.    Past Medical History:  Diagnosis Date  . Anemia   . Anxiety   . COVID-19   . Depression   . Fibrocystic disease of both breasts   . History of cervical dysplasia    laser surgery  . Insomnia   . Low blood sugar 2001   pt checks BS in AM, controls with diet  . Vaginal Pap smear, abnormal     Patient Active Problem List   Diagnosis Date Noted  . Acquired absence of breast, bilateral 10/27/2018  . S/P mastectomy, bilateral 10/27/2018  . Surgery follow-up 10/19/2018  . Breast disorder in female 06/02/2018  . Family history of breast cancer 06/02/2018  . Adhesive capsulitis 04/27/2018  . Nausea with vomiting 05/10/2016  . Left flank pain 12/21/2014  . Mood disorder ([place]) 08/31/2014  . Menorrhagia 05/09/2014  . Preventative health care 05/09/2014  . Left breast mass 08/03/2013  . Migraine, chronic, without aura 11/19/2011  . Episodic mood disorder ([place]) 07/18/2008  . Lump or mass in breast 06/30/2008  . ANEMIA, IRON DEFICIENCY, HX OF 10/07/2007    Past Surgical History:  Procedure Laterality Date  . BREAST FIBROADENOMA SURGERY  2011, 2008   benign   . BREAST RECONSTRUCTION WITH PLACEMENT OF TISSUE EXPANDER AND FLEX HD (ACELLULAR HYDRATED DERMIS) Bilateral 10/19/2018   Procedure:  BREAST RECONSTRUCTION WITH PLACEMENT OF TISSUE EXPANDER AND FLEX HD (ACELLULAR HYDRATED DERMIS);  Surgeon: Wallace Going, DO;  Location: [facility];  Service: Plastics;  Laterality: Bilateral;  . CESAREAN SECTION  1995  . HYSTEROSCOPY WITH NOVASURE N/A 01/02/2016   Procedure: NOVASURE;  Surgeon: Woodroe Mode, MD;  Location: [facility];  Service: Gynecology;  Laterality: N/A;  . NIPPLE SPARING MASTECTOMY Bilateral 10/19/2018   Procedure: BILATERAL NIPPLE SPARING MASTECTOMY;  Surgeon: Jovita Kussmaul, MD;  Location: [facility];  Service: General;  Laterality: Bilateral;  . REMOVAL OF BILATERAL TISSUE EXPANDERS WITH PLACEMENT OF BILATERAL BREAST IMPLANTS Bilateral 12/30/2018   Procedure: REMOVAL OF BILATERAL TISSUE EXPANDERS WITH PLACEMENT OF BILATERAL BREAST IMPLANTS;  Surgeon: Wallace Going, DO;  Location: [facility];  Service: Plastics;  Laterality: Bilateral;  . TUBAL LIGATION  2003     OB History    Gravida  3   Para  3   Term  3   Preterm      AB      Living  3     SAB      TAB      Ectopic      Multiple      Live Births  3           Family History  Problem Relation Age of Onset  . Anxiety disorder  Father   . Depression Father   . Diabetes Maternal Grandmother   . Hypertension Paternal Grandfather   . Cancer Maternal Aunt        breast  . Breast cancer Maternal Aunt   . Alcohol abuse Maternal Uncle   . Alcohol abuse Cousin   . Drug abuse Cousin   . Stroke Neg Hx     Social History   Tobacco Use  . Smoking status: Never Smoker  . Smokeless tobacco: Never Used  Substance Use Topics  . Alcohol use: No    Alcohol/week: 0.0 standard drinks  . Drug use: No    Home Medications Prior to Admission medications   Medication Sig Start Date End Date Taking? Authorizing Provider  acetaminophen (TYLENOL) 500 MG tablet Take 1,000 mg by mouth every 6 (six) hours as needed for moderate pain.    [provider]  ARIPiprazole (ABILIFY) 20 MG  tablet Take 1 tablet (20 mg total) by mouth daily. 05/05/19 05/04/20  Arfeen, Arlyce Harman, MD  hydrOXYzine (VISTARIL) 25 MG capsule Take 1 capsule (25 mg total) by mouth 2 (two) times daily as needed for anxiety. Patient not taking: Reported on 05/05/2019 02/02/19   Arfeen, Arlyce Harman, MD  sertraline (ZOLOFT) 50 MG tablet Take 1 tablet (50 mg total) by mouth at bedtime. 05/05/19 05/04/20  [name], MD    Allergies    Lamictal [lamotrigine], Codeine, and Oxycodone hcl  Review of Systems   Review of Systems  All other systems reviewed and are negative.   Physical Exam Updated Vital Signs BP 111/72 (BP Location: Right Arm)   Pulse 65   Temp 98.6 F (37 C) (Oral)   Resp 18   SpO2 100%   Physical Exam Vitals and nursing note reviewed.  Constitutional:      Appearance: She is well-developed.  HENT:     Head: Normocephalic and atraumatic.     Right Ear: External ear normal.     Left Ear: External ear normal.  Eyes:     Conjunctiva/sclera: Conjunctivae normal.     Pupils: Pupils are equal, round, and reactive to light.  Neck:     Trachea: Phonation normal.  Cardiovascular:     Rate and Rhythm: Normal rate and regular rhythm.     Comments: Normal right radius pulse Pulmonary:     Effort: Pulmonary effort is normal. No respiratory distress.     Breath sounds: No stridor.  Abdominal:     Palpations: Abdomen is soft.     Tenderness: There is no abdominal tenderness.  Musculoskeletal:        General: Normal range of motion.     Cervical back: Normal range of motion and neck supple.  Skin:    General: Skin is warm and dry.  Neurological:     Mental Status: She is alert and oriented to person, place, and time.     Cranial Nerves: No cranial nerve deficit.     Sensory: No sensory deficit.     Motor: No abnormal muscle tone.     Coordination: Coordination normal.  Psychiatric:        Mood and Affect: Mood normal.        Behavior: Behavior normal.        Thought Content: Thought  content normal.        Judgment: Judgment normal.     ED Results / Procedures / Treatments   Labs (all labs ordered are listed, but only abnormal results are displayed)  Labs Reviewed  BASIC METABOLIC PANEL - Abnormal; Notable for the following components:      Result Value   CO2 21 (*)    Glucose, Bld 126 (*)    Creatinine, Ser 1.06 (*)    All other components within normal limits  CBC  I-STAT BETA HCG BLOOD, ED ([facility], [facility], [facility] ONLY)  TROPONIN I (HIGH SENSITIVITY)  TROPONIN I (HIGH SENSITIVITY)    EKG EKG Interpretation  Date/Time:  Monday May 17 2019 11:36:51 EST Ventricular Rate:  76 PR Interval:  128 QRS Duration: 68 QT Interval:  340 QTC Calculation: 382 R Axis:   42 Text Interpretation: Normal sinus rhythm with sinus arrhythmia Nonspecific T wave abnormality Abnormal ECG since last tracing no significant change Confirmed by Daleen Bo [phone]) on 05/17/2019 1:19:58 PM   Radiology No results found.  Procedures Procedures (including critical care time)  Medications Ordered in ED Medications  sodium chloride flush (NS) 0.9 % injection 3 mL (3 mLs Intravenous Not Given 05/17/19 1440)    ED Course  I have reviewed the triage vital signs and the nursing notes.  Pertinent labs & imaging results that were available during my care of the patient were reviewed by me and considered in my medical decision making (see chart for details).  Clinical Course as of May 17 1607  Mon May 17, 2019  1549 Normal  Troponin I (High Sensitivity) [EW]  1549 Normal  I-Stat beta hCG blood, ED [EW]  1550 Normal except CO2 low, glucose high, creatinine high  Basic metabolic panel(!) [EW]  [id number] Normal  CBC [EW]    Clinical Course User Index [EW] Daleen Bo, MD   MDM Rules/Calculators/A&P                       Patient Vitals for the past 24 hrs:  BP Temp Temp src Pulse Resp SpO2  05/17/19 1559 111/72 -- -- 65 18 100 %  05/17/19 1400 120/79 -- -- 63 18 100 %  05/17/19  1345 118/78 -- -- 63 -- 99 %  05/17/19 1330 117/82 -- -- 61 -- 100 %  05/17/19 1315 119/78 -- -- 70 -- 100 %  05/17/19 1300 115/76 -- -- 63 -- 100 %  05/17/19 1245 112/70 -- -- 70 -- 100 %  05/17/19 1230 113/75 -- -- 69 -- 100 %  05/17/19 1215 112/75 -- -- 68 -- 100 %  05/17/19 1145 -- 98.6 F (37 C) Oral -- -- --  05/17/19 1136 130/84 -- -- 78 16 100 %    4:09 PM Reevaluation with update and discussion. After initial assessment and treatment, an updated evaluation reveals no change in clinical status, findings discussed and questions answered. Daleen Bo   Medical Decision Making: COVID-19 infection without serious respiratory compromise.  Screening evaluation is reassuring.  Clinical evaluations are reassuring.  No indication for hospitalization or further intervention at this time.  She is stable for discharge.  She will require quarantine for 10 days or 2 weeks, and to be symptom-free.  CRITICAL CARE-no Performed by: Daleen Bo   Nursing Notes Reviewed/ Care Coordinated Applicable Imaging Reviewed Interpretation of Laboratory Data incorporated into ED treatment  The patient appears reasonably screened and/or stabilized for discharge and I doubt any other medical condition or other [facility] requiring further screening, evaluation, or treatment in the ED at this time prior to discharge.  Plan: Home Medications-routine OTC symptomatic treatment; Home Treatments-rest, fluids, gradual advance diet and activity; return here if  the recommended treatment, does not improve the symptoms; Recommended follow up-PCP, as needed   Final Clinical Impression(s) / ED Diagnoses Final diagnoses:  COVID-19 virus infection    Rx / DC Orders ED Discharge Orders    None       Daleen Bo, MD 05/17/19 1609

## 2019-05-17 NOTE — Discharge Instructions (Addendum)
Stay at home and quarantine yourself until your symptoms are gone and it has been at least 10 days, from your Covid test.  Return here or see your doctor as needed for problems.  For cough you can use Robitussin-DM.  For fever you can use Tylenol 650 mg every 4 hours.  Make sure that you are drinking plenty of fluids and eating 3 meals each day.

## 2019-05-17 NOTE — Progress Notes (Signed)
Attempted 2x to connect with patient and had to leave hippacompliant VM to call back when she needs to speak with a doctor.  -Dr. Criss Rosales

## 2019-05-17 NOTE — ED Triage Notes (Signed)
Tested + for COVID on Friday.  Reports SOB, tightness to center of chest, cough, and night sweats since Saturday.

## 2019-06-10 ENCOUNTER — Encounter: Payer: Self-pay | Admitting: Family Medicine

## 2019-06-14 ENCOUNTER — Other Ambulatory Visit: Payer: Self-pay

## 2019-06-14 ENCOUNTER — Encounter: Payer: Self-pay | Admitting: Family Medicine

## 2019-06-14 ENCOUNTER — Ambulatory Visit (INDEPENDENT_AMBULATORY_CARE_PROVIDER_SITE_OTHER): Payer: Self-pay | Admitting: Family Medicine

## 2019-06-14 DIAGNOSIS — Z862 Personal history of diseases of the blood and blood-forming organs and certain disorders involving the immune mechanism: Secondary | ICD-10-CM

## 2019-06-14 DIAGNOSIS — Z0000 Encounter for general adult medical examination without abnormal findings: Secondary | ICD-10-CM

## 2019-06-14 MED ORDER — NORETHIN ACE-ETH ESTRAD-FE 1-20 MG-MCG(24) PO TABS: 1.0000 | ORAL_TABLET | Freq: Every day | ORAL | 11 refills | Status: DC

## 2019-06-14 NOTE — Progress Notes (Signed)
    SUBJECTIVE:   CHIEF COMPLAINT / HPI:   Patient is here for her yearly checkup.  Patient's current concern is that she would like to have her hemoglobin checked.  I discussed with her signs and symptoms of anemia and she reports no symptoms of anemia.  No abnormal bleeding.  Most recent hemoglobin was done in January 2021 and was 13.3.  She agrees that there may be no need for lab work at this time.  Routine health maintenance I discussed the Pap with the patient the patient reports that she had a cervical ablation done in 2017 and she was under the impression that she could no longer get pregnant.  Patient says that she is concerned because she has a friend who had an ablation and is now pregnant.  Patient reports that the last menstrual cycle she remembers was right around the same time she had the ablation back in 2017.  Patient reports that she is sexually active with her husband and they do not use protection.  Discussed with patient the fact that the ablation does affect fertility and that it is unlikely that she will get pregnant.  Also discussed with patient the fact that she has not had a menstrual cycle in several years meaning she is postmenopausal.  No preventative measures necessary at this time.  Patient is overdue for her Pap smear with last one being in 2016.  Patient will need to schedule Pap smear.   OBJECTIVE:   BP 100/80   Pulse 82   Wt 128 lb 9.6 oz (58.3 kg)   SpO2 99%   BMI 22.07 kg/m   General: No acute distress, sitting comfortably in chair Cardio: Regular rate and rhythm, no murmurs appreciated Respiratory: Normal work of breathing, clear to auscultation bilaterally Abdominal: Positive bowel sounds, no tenderness to palpation Neuro: Cranial nerves grossly intact, upper and lower strength is equal bilateral no deficits noted.  Ambulates well. Psych: Good mood, answers questions appropriately.  ASSESSMENT/PLAN:   ANEMIA, IRON DEFICIENCY, HX OF Patient voices  concerns for iron deficiency or anemia at this time.  Patient reports no signs or symptoms of anemia.  Most recent CBC was in January 2021.  Hemoglobin was 13.3.   -No need for lab work at this time -Discussed signs and symptoms of anemia for patient to look out for   Routine health maintenance Patient's last Pap smear was in 2016.  Patient is due for Pap and will need to schedule an appointment.  Patient has job interview and is unable to do it today. -Follow-up for Pap smear     Gifford Shave, MD [place]

## 2019-06-14 NOTE — Patient Instructions (Addendum)
It was a pleasure to meet you today.  There is no need for [name] to do any lab work today.  Your lab work in January looked normal and you are having no symptoms that indicate any changes in that.  Regarding your cervical ablation and pregnancy prevention I am going to prescribe you some oral contraceptive pills.  If you have any questions or concerns please feel free to call the clinic and we will be more than happy.  I have you have a wonderful day!  Ethinyl Estradiol; Norethindrone Acetate; Ferrous fumarate tablets or capsules What is this medicine? ETHINYL ESTRADIOL; NORETHINDRONE ACETATE; FERROUS FUMARATE (ETH in il es tra DYE ole; nor eth IN drone AS e tate; FER [name] FUE ma rate) is an oral contraceptive. The products combine two types of female hormones, an estrogen and a progestin. They are used to prevent ovulation and pregnancy. Some products are also used to treat acne in females. This medicine may be used for other purposes; ask your health care provider or pharmacist if you have questions. COMMON BRAND NAME(S): Aurovela [address] 1/20, Aurovela Fe, Blisovi [address], [address] Fe, Estrostep Fe, Gildess 24 Fe, Gildess Fe 1.5/30, Gildess Fe 1/20, Hailey 24 Fe, Hailey Fe 1.5/30, Junel Fe 1.5/30, Junel Fe 1/20, Junel Fe 24, Larin Fe, Lo Loestrin Fe, Loestrin 24 Fe, Loestrin FE 1.5/30, Loestrin FE 1/20, Lomedia 24 Fe, Microgestin 24 Fe, Microgestin Fe 1.5/30, Microgestin Fe 1/20, Tarina 24 Fe, Tarina Fe 1/20, Taytulla, Tilia Fe, Tri-Legest Fe What should I tell my health care provider before I take this medicine? They need to know if you have any of these conditions:  abnormal vaginal bleeding  blood vessel disease  breast, cervical, endometrial, ovarian, liver, or uterine cancer  diabetes  gallbladder disease  heart disease or recent heart attack  high blood pressure  high cholesterol  history of blood clots  kidney disease  liver disease  migraine headaches  smoke  tobacco  stroke  systemic lupus erythematosus (SLE)  an unusual or allergic reaction to estrogens, progestins, other medicines, foods, dyes, or preservatives  pregnant or trying to get pregnant  breast-feeding How should I use this medicine? Take this medicine by mouth. To reduce nausea, this medicine may be taken with food. Follow the directions on the prescription label. Take this medicine at the same time each day and in the order directed on the package. Do not take your medicine more often than directed. A patient package insert for the product will be given with each prescription and refill. Read this sheet carefully each time. The sheet may change frequently. Contact your pediatrician regarding the use of this medicine in children. Special care may be needed. This medicine has been used in female children who have started having menstrual periods. Overdosage: If you think you have taken too much of this medicine contact a poison control center or emergency room at once. NOTE: This medicine is only for you. Do not share this medicine with others. What if I miss a dose? If you miss a dose, refer to the patient information sheet you received with your medicine for direction. If you miss more than one pill, this medicine may not be as effective and you may need to use another form of birth control. What may interact with this medicine? Do not take this medicine with the following medication:  dasabuvir; ombitasvir; paritaprevir; ritonavir  ombitasvir; paritaprevir; ritonavir This medicine may also interact with the following medications:  acetaminophen  antibiotics  or medicines for infections, especially rifampin, rifabutin, rifapentine, and griseofulvin, and possibly penicillins or tetracyclines  aprepitant  ascorbic acid (vitamin C)  atorvastatin  barbiturate medicines, such as  phenobarbital  bosentan  carbamazepine  caffeine  clofibrate  cyclosporine  dantrolene  doxercalciferol  felbamate  grapefruit juice  hydrocortisone  medicines for anxiety or sleeping problems, such as diazepam or temazepam  medicines for diabetes, including pioglitazone  mineral oil  modafinil  mycophenolate  nefazodone  oxcarbazepine  phenytoin  prednisolone  ritonavir or other medicines for HIV infection or AIDS  rosuvastatin  selegiline  soy isoflavones supplements  St. Lillee Mooneyhan's wort  tamoxifen or raloxifene  theophylline  thyroid hormones  topiramate  warfarin This list may not describe all possible interactions. Give your health care provider a list of all the medicines, herbs, non-prescription drugs, or dietary supplements you use. Also tell them if you smoke, drink alcohol, or use illegal drugs. Some items may interact with your medicine. What should I watch for while using this medicine? Visit your doctor or health care professional for regular checks on your progress. You will need a regular breast and pelvic exam and Pap smear while on this medicine. Use an additional method of contraception during the first cycle that you take these tablets. If you have any reason to think you are pregnant, stop taking this medicine right away and contact your doctor or health care professional. If you are taking this medicine for hormone related problems, it may take several cycles of use to see improvement in your condition. Smoking increases the risk of getting a blood clot or having a stroke while you are taking birth control pills, especially if you are more than 46 years old. You are strongly advised not to smoke. This medicine can make your body retain fluid, making your fingers, hands, or ankles swell. Your blood pressure can go up. Contact your doctor or health care professional if you feel you are retaining fluid. This medicine can make you more  sensitive to the sun. Keep out of the sun. If you cannot avoid being in the sun, wear protective clothing and use sunscreen. Do not use sun lamps or tanning beds/booths. If you wear contact lenses and notice visual changes, or if the lenses begin to feel uncomfortable, consult your eye care specialist. In some women, tenderness, swelling, or minor bleeding of the gums may occur. Notify your dentist if this happens. Brushing and flossing your teeth regularly may help limit this. See your dentist regularly and inform your dentist of the medicines you are taking. If you are going to have elective surgery, you may need to stop taking this medicine before the surgery. Consult your health care professional for advice. This medicine does not protect you against HIV infection (AIDS) or any other sexually transmitted diseases. What side effects may I notice from receiving this medicine? Side effects that you should report to your doctor or health care professional as soon as possible:  allergic reactions like skin rash, itching or hives, swelling of the face, lips, or tongue  breast tissue changes or discharge  changes in vaginal bleeding during your period or between your periods  changes in vision  chest pain  confusion  coughing up blood  dizziness  feeling faint or lightheaded  headaches or migraines  leg, arm or groin pain  loss of balance or coordination  severe or sudden headaches  stomach pain (severe)  sudden shortness of breath  sudden numbness or weakness of the  face, arm or leg  symptoms of vaginal infection like itching, irritation or unusual discharge  tenderness in the upper abdomen  trouble speaking or understanding  vomiting  yellowing of the eyes or skin Side effects that usually do not require medical attention (report to your doctor or health care professional if they continue or are bothersome):  breakthrough bleeding and spotting that continues beyond  the 3 initial cycles of pills  breast tenderness  mood changes, anxiety, depression, frustration, anger, or emotional outbursts  increased sensitivity to sun or ultraviolet light  nausea  skin rash, acne, or brown spots on the skin  weight gain (slight) This list may not describe all possible side effects. Call your doctor for medical advice about side effects. You may report side effects to FDA at 1-800-FDA-1088. Where should I keep my medicine? Keep out of the reach of children. Store at room temperature between 15 and 30 degrees C (59 and 86 degrees F). Throw away any unused medicine after the expiration date. NOTE: This sheet is a summary. It may not cover all possible information. If you have questions about this medicine, talk to your doctor, pharmacist, or health care provider.  2020 Elsevier/Gold Standard (2015-12-18 08:04:41)

## 2019-06-14 NOTE — Assessment & Plan Note (Addendum)
Patient voices concerns for iron deficiency or anemia at this time.  Patient reports no signs or symptoms of anemia.  Most recent CBC was in January 2021.  Hemoglobin was 13.3.   -No need for lab work at this time -Discussed signs and symptoms of anemia for patient to look out for

## 2019-06-14 NOTE — Assessment & Plan Note (Signed)
Patient's last Pap smear was in 2016.  Patient is due for Pap and will need to schedule an appointment.  Patient has job interview and is unable to do it today. -Follow-up for Pap smear

## 2019-06-18 ENCOUNTER — Telehealth: Payer: Self-pay | Admitting: Family Medicine

## 2019-06-18 NOTE — Telephone Encounter (Signed)
Spoke with patient today regarding contraception recommendations.  Recommended that she speak with her OB/GYN regarding her need for contraception.  She acknowledges this and so that she will do so.  Congratulated the patient on her job.

## 2019-07-01 IMAGING — CR CHEST  1 VIEW
1 series · 1 of 1 positions shown · non-contrast
Comparison: None.

CLINICAL DATA: Instrument count, needle count discrepancy status
post bilateral nipple sparing mastectomy.

EXAM:
CHEST  1 VIEW

[AP]
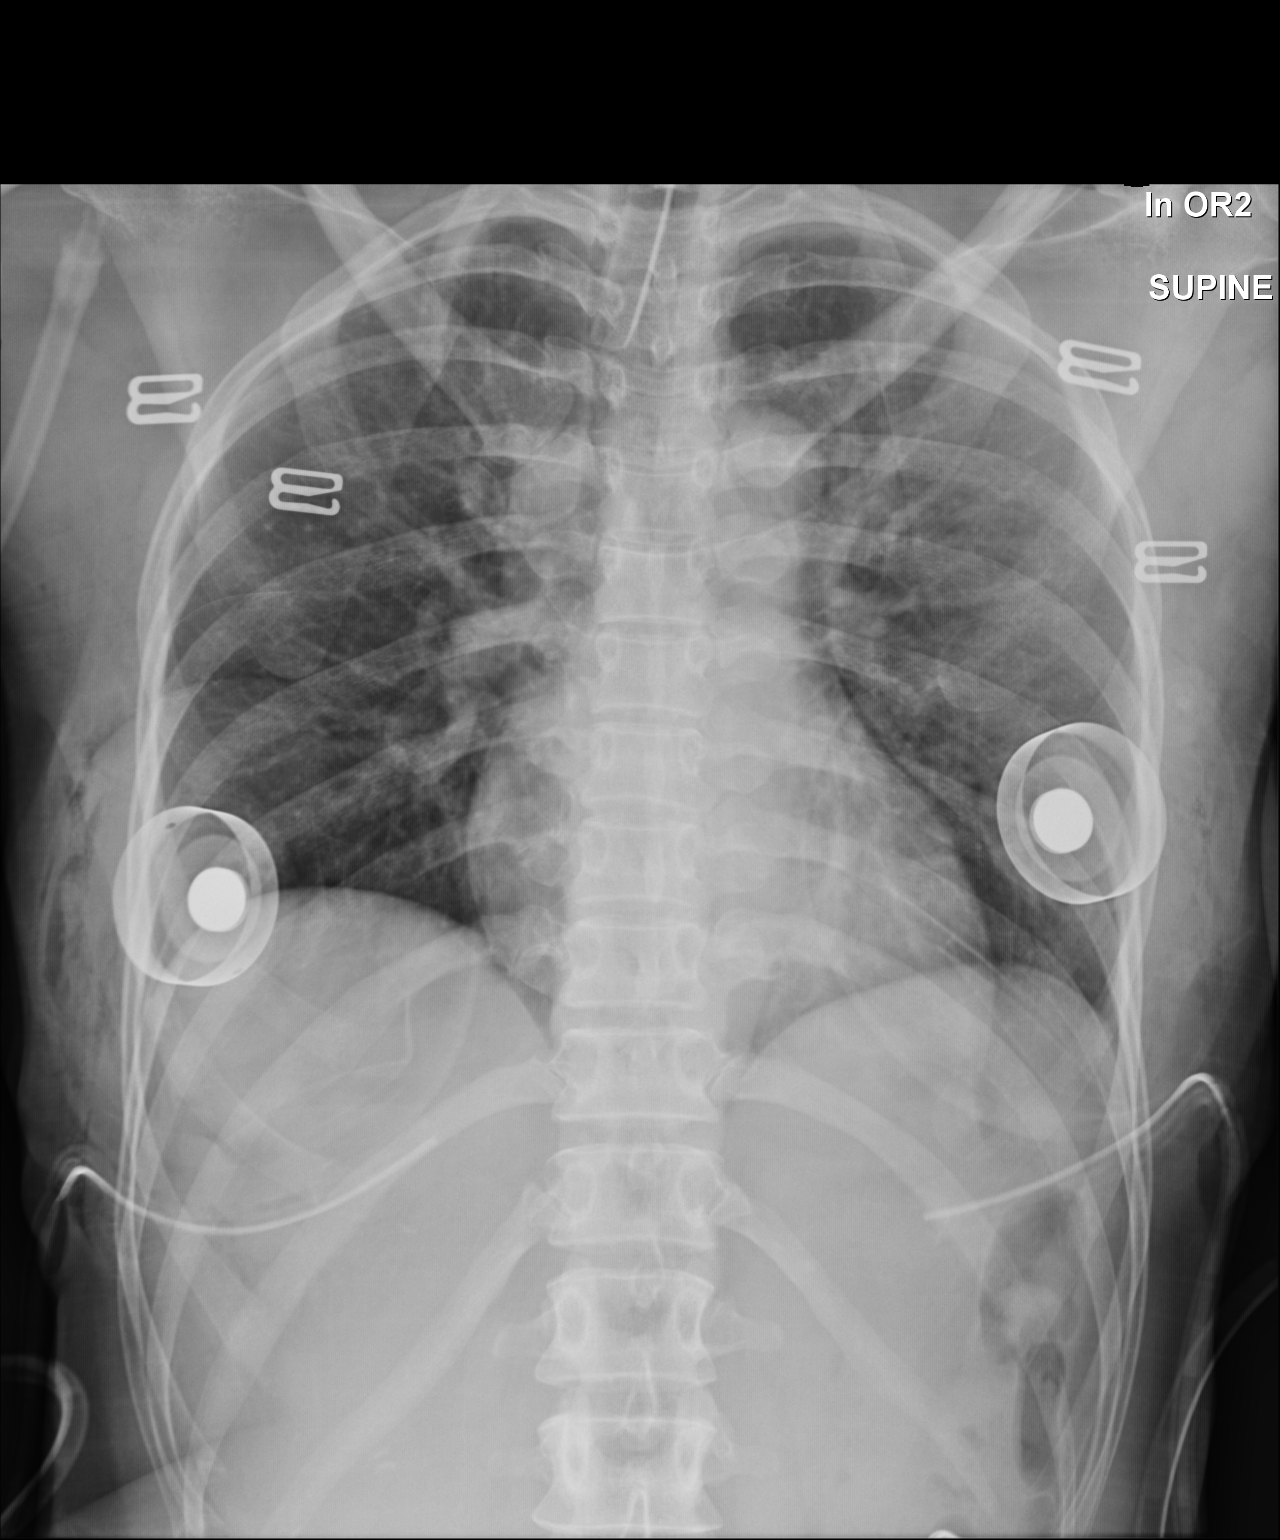

[1 of 1 positions shown; findings below may reference images not displayed]

FINDINGS: Endotracheal tube terminates 4.9 cm above the carina. Trachea is
midline. Heart size normal. Lungs are somewhat low in volume but
clear. No pleural fluid.

No unexpected radiopaque foreign body. Patient is status post
bilateral mastectomy with surgical drains in place. Subcutaneous air
is seen along the lateral chest wall.
IMPRESSION: No radiopaque foreign body.

## 2019-07-07 ENCOUNTER — Emergency Department (HOSPITAL_COMMUNITY)
Admission: EM | Admit: 2019-07-07 | Discharge: 2019-07-07 | Disposition: A | Discharge status: Home / Self Care | Payer: Self-pay | Attending: Emergency Medicine | Admitting: Emergency Medicine

## 2019-07-07 ENCOUNTER — Other Ambulatory Visit: Payer: Self-pay

## 2019-07-07 ENCOUNTER — Encounter (HOSPITAL_COMMUNITY): Payer: Self-pay | Admitting: Emergency Medicine

## 2019-07-07 DIAGNOSIS — R42 Dizziness and giddiness: Secondary | ICD-10-CM | POA: Diagnosis not present

## 2019-07-07 DIAGNOSIS — N3000 Acute cystitis without hematuria: Secondary | ICD-10-CM | POA: Diagnosis not present

## 2019-07-07 DIAGNOSIS — R1030 Lower abdominal pain, unspecified: Secondary | ICD-10-CM | POA: Diagnosis present

## 2019-07-07 LAB — COMPREHENSIVE METABOLIC PANEL
ALT: 11 U/L (ref 0–44)
AST: 19 U/L (ref 15–41)
Albumin: 4.3 g/dL (ref 3.5–5.0)
Alkaline Phosphatase: 38 U/L (ref 38–126)
Anion gap: 9 (ref 5–15)
BUN: 8 mg/dL (ref 6–20)
CO2: 23 mmol/L (ref 22–32)
Calcium: 9.2 mg/dL (ref 8.9–10.3)
Chloride: 107 mmol/L (ref 98–111)
Creatinine, Ser: 1.02 mg/dL — ABNORMAL HIGH (ref 0.44–1.00)
GFR calc Af Amer: >60 mL/min (ref >60)
GFR calc non Af Amer: >60 mL/min (ref >60)
Glucose, Bld: 100 mg/dL — ABNORMAL HIGH (ref 70–99)
Potassium: 3.2 mmol/L — ABNORMAL LOW (ref 3.5–5.1)
Sodium: 139 mmol/L (ref 135–145)
Total Bilirubin: 0.5 mg/dL (ref 0.3–1.2)
Total Protein: 7.5 g/dL (ref 6.5–8.1)

## 2019-07-07 LAB — URINALYSIS, ROUTINE W REFLEX MICROSCOPIC
Bilirubin Urine: NEGATIVE
Glucose, UA: NEGATIVE mg/dL
Ketones, ur: 5 mg/dL — AB
Nitrite: NEGATIVE
Protein, ur: 100 mg/dL — AB
RBC / HPF: >50 RBC/hpf — ABNORMAL HIGH (ref 0–5)
Specific Gravity, Urine: 1.021 (ref 1.005–1.030)
WBC, UA: >50 WBC/hpf — ABNORMAL HIGH (ref 0–5)
pH: 6 (ref 5.0–8.0)

## 2019-07-07 LAB — CBC
HCT: 39.7 % (ref 36.0–46.0)
Hemoglobin: 12.7 g/dL (ref 12.0–15.0)
MCH: 28.8 pg (ref 26.0–34.0)
MCHC: 32 g/dL (ref 30.0–36.0)
MCV: 90 fL (ref 80.0–100.0)
Platelets: 202 10*3/uL (ref 150–400)
RBC: 4.41 MIL/uL (ref 3.87–5.11)
RDW: 13.2 % (ref 11.5–15.5)
WBC: 5.5 10*3/uL (ref 4.0–10.5)
nRBC: 0 % (ref 0.0–0.2)

## 2019-07-07 LAB — I-STAT BETA HCG BLOOD, ED (MC, WL, AP ONLY): I-stat hCG, quantitative: <5 m[IU]/mL (ref <5)

## 2019-07-07 LAB — PREGNANCY, URINE: Preg Test, Ur: NEGATIVE

## 2019-07-07 LAB — LIPASE, BLOOD: Lipase: 25 U/L (ref 11–51)

## 2019-07-07 MED ORDER — NITROFURANTOIN MONOHYD MACRO 100 MG PO CAPS: 100.0000 mg | ORAL_CAPSULE | Freq: Two times a day (BID) | ORAL | 0 refills | Status: DC

## 2019-07-07 MED ORDER — ACETAMINOPHEN 325 MG PO TABS
650.0000 mg | ORAL_TABLET | Freq: Once | ORAL | Status: AC
  Administered 2019-07-07: 650 mg via ORAL
  Filled 2019-07-07: qty 2

## 2019-07-07 MED ORDER — SODIUM CHLORIDE 0.9% FLUSH
3.0000 mL | Freq: Once | INTRAVENOUS | Status: AC
  Administered 2019-07-07: 3 mL via INTRAVENOUS

## 2019-07-07 MED ORDER — PHENAZOPYRIDINE HCL 200 MG PO TABS: 200.0000 mg | ORAL_TABLET | Freq: Three times a day (TID) | ORAL | 0 refills | Status: DC

## 2019-07-07 MED ORDER — NITROFURANTOIN MONOHYD MACRO 100 MG PO CAPS
100.0000 mg | ORAL_CAPSULE | Freq: Once | ORAL | Status: AC
  Administered 2019-07-07: 100 mg via ORAL
  Filled 2019-07-07: qty 1

## 2019-07-07 MED ORDER — PHENAZOPYRIDINE HCL 200 MG PO TABS
200.0000 mg | ORAL_TABLET | Freq: Once | ORAL | Status: AC
  Administered 2019-07-07: 200 mg via ORAL
  Filled 2019-07-07: qty 1

## 2019-07-07 NOTE — ED Triage Notes (Signed)
Patient complaining of lower abdominal pressure and trouble urinating. Patient states when she got up to get ready for work she noticed these symptoms.

## 2019-07-07 NOTE — Discharge Instructions (Signed)
Please read and follow all provided instructions.  Your diagnoses today include:  1. Acute cystitis without hematuria   2. Lightheadedness     Tests performed today include:  Urine test - suggests that you have an infection in your bladder  EKG  Blood counts and electrolytes - slightly low potassium  Vital signs. See below for your results today.   Medications prescribed:   Macrobid - antibiotic for urinary tract infection  You have been prescribed an antibiotic medicine: take the entire course of medicine even if you are feeling better. Stopping early can cause the antibiotic not to work.   Pyridium - medication for urinary tract infection symptoms.   This medication will turn your urine orange. This is normal.   Home care instructions:  Follow any educational materials contained in this packet.  Follow-up instructions: Please follow-up with your primary care provider in 3 days if symptoms are not resolved for further evaluation of your symptoms.  Return instructions:   Please return to the Emergency Department if you experience worsening symptoms.   Return with fever, worsening pain, persistent vomiting, worsening pain in your back.   Please return if you have any other emergent concerns.  Additional Information:  Your vital signs today were: BP 108/82   Pulse 64   Temp 98.6 F (37 C) (Oral)   Resp 15   Ht 5\' 4"  (1.626 m)   Wt 54.4 kg   SpO2 100%   BMI 20.60 kg/m  If your blood pressure (BP) was elevated above 135/85 this visit, please have this repeated by your doctor within one month. --------------

## 2019-07-07 NOTE — ED Notes (Signed)
Patient ambulated independently to restroom to obtain urine sample

## 2019-07-07 NOTE — ED Provider Notes (Signed)
[facility] DEPT Provider Note   CSN: [id number] Arrival date & time: 07/07/19  0554     History Chief Complaint  Patient presents with  . Urinary Tract Infection  . Abdominal Pain    Bethany [name] is a 44 y.o. female.  Patient presents emergency department with complaint of lower abdominal tenderness, dysuria, increased frequency and urgency starting this morning.  Patient states that she was cooking last night and had an episode where she felt very lightheaded and dizzy.  She did not have full syncope.  No chest pain or shortness of breath.  She states that she was tired and just went to bed.  Upon waking up this morning she had a UTI symptoms.  No fevers, flank pain, vomiting.  Patient states that she felt lightheaded again this morning when the nurse was starting an IV on her.  She also has developed a headache starting this morning on the left side.  Patient denies signs of stroke including: facial droop, slurred speech, aphasia, weakness/numbness in extremities, imbalance/trouble walking.         Past Medical History:  Diagnosis Date  . Anemia   . Anxiety   . COVID-19   . Depression   . Fibrocystic disease of both breasts   . History of cervical dysplasia    laser surgery  . Insomnia   . Low blood sugar 2001   pt checks BS in AM, controls with diet  . Vaginal Pap smear, abnormal     Patient Active Problem List   Diagnosis Date Noted  . Acquired absence of breast, bilateral 10/27/2018  . S/P mastectomy, bilateral 10/27/2018  . Surgery follow-up 10/19/2018  . Breast disorder in female 06/02/2018  . Family history of breast cancer 06/02/2018  . Adhesive capsulitis 04/27/2018  . Nausea with vomiting 05/10/2016  . Left flank pain 12/21/2014  . Mood disorder ([place]) 08/31/2014  . Menorrhagia 05/09/2014  . Routine health maintenance 05/09/2014  . Left breast mass 08/03/2013  . Migraine, chronic, without aura 11/19/2011  . Episodic mood  disorder ([place]) 07/18/2008  . Lump or mass in breast 06/30/2008  . ANEMIA, IRON DEFICIENCY, HX OF 10/07/2007    Past Surgical History:  Procedure Laterality Date  . BREAST FIBROADENOMA SURGERY  2011, 2008   benign   . BREAST RECONSTRUCTION WITH PLACEMENT OF TISSUE EXPANDER AND FLEX HD (ACELLULAR HYDRATED DERMIS) Bilateral 10/19/2018   Procedure: BREAST RECONSTRUCTION WITH PLACEMENT OF TISSUE EXPANDER AND FLEX HD (ACELLULAR HYDRATED DERMIS);  Surgeon: Wallace Going, DO;  Location: [facility];  Service: Plastics;  Laterality: Bilateral;  . CESAREAN SECTION  1995  . HYSTEROSCOPY WITH NOVASURE N/A 01/02/2016   Procedure: NOVASURE;  Surgeon: Woodroe Mode, MD;  Location: [facility];  Service: Gynecology;  Laterality: N/A;  . NIPPLE SPARING MASTECTOMY Bilateral 10/19/2018   Procedure: BILATERAL NIPPLE SPARING MASTECTOMY;  Surgeon: Jovita Kussmaul, MD;  Location: [facility];  Service: General;  Laterality: Bilateral;  . REMOVAL OF BILATERAL TISSUE EXPANDERS WITH PLACEMENT OF BILATERAL BREAST IMPLANTS Bilateral 12/30/2018   Procedure: REMOVAL OF BILATERAL TISSUE EXPANDERS WITH PLACEMENT OF BILATERAL BREAST IMPLANTS;  Surgeon: Wallace Going, DO;  Location: [facility];  Service: Plastics;  Laterality: Bilateral;  . TUBAL LIGATION  2003     OB History    Gravida  3   Para  3   Term  3   Preterm      AB      Living  3  SAB      TAB      Ectopic      Multiple      Live Births  3           Family History  Problem Relation Age of Onset  . Anxiety disorder Father   . Depression Father   . Diabetes Maternal Grandmother   . Hypertension Paternal Grandfather   . Cancer Maternal Aunt        breast  . Breast cancer Maternal Aunt   . Alcohol abuse Maternal Uncle   . Alcohol abuse Cousin   . Drug abuse Cousin   . Stroke Neg Hx     Social History   Tobacco Use  . Smoking status: Never Smoker  . Smokeless tobacco: Never Used  Substance Use Topics  . Alcohol  use: No    Alcohol/week: 0.0 standard drinks  . Drug use: No    Home Medications Prior to Admission medications   Medication Sig Start Date End Date Taking? Authorizing Provider  acetaminophen (TYLENOL) 500 MG tablet Take 1,000 mg by mouth every 6 (six) hours as needed for moderate pain.    [provider]  ARIPiprazole (ABILIFY) 20 MG tablet Take 1 tablet (20 mg total) by mouth daily. 05/05/19 05/04/20  Arfeen, Arlyce Harman, MD  hydrOXYzine (VISTARIL) 25 MG capsule Take 1 capsule (25 mg total) by mouth 2 (two) times daily as needed for anxiety. Patient not taking: Reported on 05/05/2019 02/02/19   Arfeen, Arlyce Harman, MD  sertraline (ZOLOFT) 50 MG tablet Take 1 tablet (50 mg total) by mouth at bedtime. 05/05/19 05/04/20  [name], MD    Allergies    Lamictal [lamotrigine], Codeine, Oxycodone hcl, and Oxycodone hcl  Review of Systems   Review of Systems  Constitutional: Negative for fever.  HENT: Negative for rhinorrhea and sore throat.   Eyes: Negative for redness.  Respiratory: Negative for cough.   Cardiovascular: Negative for chest pain.  Gastrointestinal: Positive for abdominal pain. Negative for diarrhea, nausea and vomiting.  Genitourinary: Positive for dysuria, frequency and urgency. Negative for hematuria.  Musculoskeletal: Negative for myalgias.  Skin: Negative for rash.  Neurological: Positive for light-headedness and headaches. Negative for syncope.    Physical Exam Updated Vital Signs BP 115/76   Pulse 83   Temp 98.6 F (37 C) (Oral)   Resp 16   Ht 5\' 4"  (1.626 m)   Wt 54.4 kg   SpO2 98%   BMI 20.60 kg/m   Physical Exam Vitals and nursing note reviewed.  Constitutional:      Appearance: She is well-developed.  HENT:     Head: Normocephalic and atraumatic.     Right Ear: Tympanic membrane, ear canal and external ear normal.     Left Ear: Tympanic membrane, ear canal and external ear normal.     Nose: Nose normal.     Mouth/Throat:     Pharynx: Uvula  midline.  Eyes:     General: Lids are normal.        Right eye: No discharge.        Left eye: No discharge.     Extraocular Movements:     Right eye: No nystagmus.     Left eye: No nystagmus.     Conjunctiva/sclera: Conjunctivae normal.     Pupils: Pupils are equal, round, and reactive to light.  Cardiovascular:     Rate and Rhythm: Normal rate and regular rhythm.     Heart sounds:  Normal heart sounds.  Pulmonary:     Effort: Pulmonary effort is normal.     Breath sounds: Normal breath sounds.  Abdominal:     Palpations: Abdomen is soft.     Tenderness: There is abdominal tenderness in the right lower quadrant, suprapubic area and left lower quadrant. There is no guarding or rebound.  Musculoskeletal:     Cervical back: Normal range of motion and neck supple. No tenderness or bony tenderness.  Skin:    General: Skin is warm and dry.  Neurological:     Mental Status: She is alert and oriented to person, place, and time.     GCS: GCS eye subscore is 4. GCS verbal subscore is 5. GCS motor subscore is 6.     Cranial Nerves: No cranial nerve deficit.     Sensory: No sensory deficit.     Coordination: Coordination normal.     Gait: Gait normal.     Deep Tendon Reflexes: Reflexes are normal and symmetric.     ED Results / Procedures / Treatments   Labs (all labs ordered are listed, but only abnormal results are displayed) Labs Reviewed  URINALYSIS, ROUTINE W REFLEX MICROSCOPIC - Abnormal; Notable for the following components:      Result Value   Color, Urine YELLOW (*)    Hgb urine dipstick LARGE (*)    Ketones, ur 5 (*)    Protein, ur 100 (*)    Leukocytes,Ua LARGE (*)    RBC / HPF >50 (*)    WBC, UA >50 (*)    Bacteria, UA RARE (*)    All other components within normal limits  COMPREHENSIVE METABOLIC PANEL - Abnormal; Notable for the following components:   Potassium 3.2 (*)    Glucose, Bld 100 (*)    Creatinine, Ser 1.02 (*)    All other components within normal  limits  PREGNANCY, URINE  LIPASE, BLOOD  CBC  I-STAT BETA HCG BLOOD, ED ([facility], [facility], [facility] ONLY)    ED ECG REPORT   Date: 07/07/2019  Rate: 59  Rhythm: sinus bradycardia  QRS Axis: normal  Intervals: normal  ST/T Wave abnormalities: normal  Conduction Disutrbances:none  Narrative Interpretation:   Old EKG Reviewed: none available  I have personally reviewed the EKG tracing and agree with the computerized printout as noted.  Radiology No results found.  Procedures Procedures (including critical care time)  Medications Ordered in ED Medications  sodium chloride flush (NS) 0.9 % injection 3 mL (3 mLs Intravenous Given 07/07/19 0641)  acetaminophen (TYLENOL) tablet 650 mg (650 mg Oral Given 07/07/19 0701)  phenazopyridine (PYRIDIUM) tablet 200 mg (200 mg Oral Given 07/07/19 0724)  nitrofurantoin (macrocrystal-monohydrate) (MACROBID) capsule 100 mg (100 mg Oral Given 07/07/19 [id number])    ED Course  I have reviewed the triage vital signs and the nursing notes.  Pertinent labs & imaging results that were available during my care of the patient were reviewed by me and considered in my medical decision making (see chart for details).  Patient seen and examined.  History and exam consistent with uncomplicated cystitis.  Patient has a headache with a normal neurological exam.  No high risk features.  Given her lightheaded episode yesterday and today, will check screening EKG.  Do not feel that she needs further work-up if this looks okay.  Vital signs reviewed and are as follows: BP 115/76   Pulse 83   Temp 98.6 F (37 C) (Oral)   Resp 16   Ht 5'  4" (1.626 m)   Wt 54.4 kg   SpO2 98%   BMI 20.60 kg/m   8:44 AM EKG reviewed.  No signs of heart block, prolonged QTC, Brugada syndrome, reentrant tachycardia, hypertrophy.  Patient be discharged home.  Patient urged to return with worsening symptoms or other concerns. Patient verbalized understanding and agrees with plan.  Encouraged return  with worsening symptoms, full syncope, chest pain or shortness of breath.      MDM Rules/Calculators/A&P                      Irritative urinary tract infection symptoms: UA consistent with UTI.  Do not suspect pyelonephritis.  Treatment with nitrofurantoin and Pyridium.  Lightheaded episodes: Normal hemoglobin, reassuring EKG.  Patient with normal neurologic exam.  No concerning features for DVT/PE.  No further work-up at this point with symptoms progress.    Final Clinical Impression(s) / ED Diagnoses Final diagnoses:  Acute cystitis without hematuria  Lightheadedness    Rx / DC Orders ED Discharge Orders    None       Carlisle Cater, PA-C 07/07/19 0845    Varney Biles, MD 07/12/19 [phone]

## 2019-10-31 ENCOUNTER — Emergency Department (HOSPITAL_COMMUNITY)
Admission: EM | Admit: 2019-10-31 | Discharge: 2019-10-31 | Disposition: A | Discharge status: Home / Self Care | Payer: No Typology Code available for payment source | Attending: Emergency Medicine | Admitting: Emergency Medicine

## 2019-10-31 ENCOUNTER — Encounter (HOSPITAL_COMMUNITY): Payer: Self-pay | Admitting: Emergency Medicine

## 2019-10-31 ENCOUNTER — Other Ambulatory Visit: Payer: Self-pay

## 2019-10-31 ENCOUNTER — Emergency Department (HOSPITAL_COMMUNITY): Payer: No Typology Code available for payment source

## 2019-10-31 DIAGNOSIS — S0990XA Unspecified injury of head, initial encounter: Secondary | ICD-10-CM

## 2019-10-31 DIAGNOSIS — Y999 Unspecified external cause status: Secondary | ICD-10-CM | POA: Diagnosis not present

## 2019-10-31 DIAGNOSIS — Y92410 Unspecified street and highway as the place of occurrence of the external cause: Secondary | ICD-10-CM | POA: Insufficient documentation

## 2019-10-31 DIAGNOSIS — M25511 Pain in right shoulder: Secondary | ICD-10-CM | POA: Diagnosis not present

## 2019-10-31 DIAGNOSIS — Y93I9 Activity, other involving external motion: Secondary | ICD-10-CM | POA: Insufficient documentation

## 2019-10-31 DIAGNOSIS — S161XXA Strain of muscle, fascia and tendon at neck level, initial encounter: Secondary | ICD-10-CM | POA: Insufficient documentation

## 2019-10-31 MED ORDER — METHOCARBAMOL 500 MG PO TABS: 500.0000 mg | ORAL_TABLET | Freq: Three times a day (TID) | ORAL | 0 refills | Status: DC | PRN

## 2019-10-31 MED ORDER — IBUPROFEN 800 MG PO TABS: 800.0000 mg | ORAL_TABLET | Freq: Three times a day (TID) | ORAL | 0 refills | Status: DC | PRN

## 2019-10-31 NOTE — ED Notes (Signed)
Patient verbalizes understanding of discharge instructions. Opportunity for questioning and answers were provided. Armband removed by staff, pt discharged from ED. Pt. ambulatory and discharged home.  
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
## 2019-10-31 NOTE — Discharge Instructions (Signed)

## 2020-04-11 ENCOUNTER — Other Ambulatory Visit: Payer: Self-pay

## 2020-04-11 ENCOUNTER — Encounter: Payer: Self-pay | Admitting: Plastic Surgery

## 2020-04-11 ENCOUNTER — Ambulatory Visit (INDEPENDENT_AMBULATORY_CARE_PROVIDER_SITE_OTHER): Payer: BC Managed Care – PPO | Admitting: Plastic Surgery

## 2020-04-11 VITALS — BP 115/76 | HR 73 | Temp 98.2°F

## 2020-04-11 DIAGNOSIS — Z9013 Acquired absence of bilateral breasts and nipples: Secondary | ICD-10-CM | POA: Diagnosis not present
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
## 2020-04-17 ENCOUNTER — Other Ambulatory Visit: Payer: Self-pay | Admitting: Plastic Surgery

## 2020-04-17 DIAGNOSIS — N644 Mastodynia: Secondary | ICD-10-CM

## 2020-04-18 ENCOUNTER — Ambulatory Visit: Payer: BC Managed Care – PPO | Admitting: Family Medicine

## 2020-04-18 ENCOUNTER — Other Ambulatory Visit: Payer: Self-pay

## 2020-04-18 VITALS — HR 90 | Temp 101.2°F

## 2020-04-18 DIAGNOSIS — J069 Acute upper respiratory infection, unspecified: Secondary | ICD-10-CM | POA: Insufficient documentation

## 2020-04-18 NOTE — Progress Notes (Signed)
    SUBJECTIVE:   CHIEF COMPLAINT / HPI:   Viral URI symptoms: Patient is a 44 year old female that presents today for flulike symptoms which include fever, chills, myalgias, diarrhea, vomiting x2, she states that food has not had much of a taste.  She denies shortness of breath.  She denies sick contacts.  She states that her symptoms started about 4 days ago.  She has been vaccinated against COVID-19 but has not had the booster, vaccine 07/01/19 and 11/20/19 with moderna.  PERTINENT  PMH / PSH: None relevant  OBJECTIVE:   Pulse 90   Temp (!) 101.2 F (38.4 C) (Oral)   SpO2 98%    General: NAD, pleasant, able to participate in exam HEENT: No pharyngeal erythema, no cervical lymphadenopathy Cardiac: RRR, no murmurs. Respiratory: CTAB, normal effort, no wheezing or crackles Skin: warm and dry, no rashes noted  ASSESSMENT/PLAN:   Viral URI Assessment: 44 y.o. female with symptoms consistent with a viral upper respiratory tract infection.  Patient symptoms include myalgias, chills, diarrhea, vomiting on 2 instances.  She denies shortness of breath.  Patient does have reduced taste sensation which is suggestive of COVID-19.  She does not know of any known sick contacts.  She does have a fever in our clinic today.  Patient is fully vaccinated with the [place] vaccine but has not had her booster and with the current omicron variant Covid is a likely possibility.  Overall differential can include viral URI which can include common cold, influenza, COVID-19, or number of other respiratory viruses.  Unlikely bacterial at this time though the patient could potentially develop sinus infection due to her congestion. Plan: -We will send for COVID-19 testing -Discussed return precautions in detail with the patient -Discussed symptomatic treatment and the lack of need for antibiotics at this time -We will provide a work note keeping her out until the results of her COVID-19 testing have returned.     Jackelyn Poling, DO [facility]    This note was prepared using Dragon voice recognition software and may include unintentional dictation errors due to the inherent limitations of voice recognition software.

## 2020-04-18 NOTE — Assessment & Plan Note (Signed)
Assessment: 44 y.o. female with symptoms consistent with a viral upper respiratory tract infection.  Patient symptoms include myalgias, chills, diarrhea, vomiting on 2 instances.  She denies shortness of breath.  Patient does have reduced taste sensation which is suggestive of COVID-19.  She does not know of any known sick contacts.  She does have a fever in our clinic today.  Patient is fully vaccinated with the [place] vaccine but has not had her booster and with the current omicron variant Covid is a likely possibility.  Overall differential can include viral URI which can include common cold, influenza, COVID-19, or number of other respiratory viruses.  Unlikely bacterial at this time though the patient could potentially develop sinus infection due to her congestion. Plan: -We will send for COVID-19 testing -Discussed return precautions in detail with the patient -Discussed symptomatic treatment and the lack of need for antibiotics at this time -We will provide a work note keeping her out until the results of her COVID-19 testing have returned.

## 2020-04-18 NOTE — Patient Instructions (Signed)
It was great to see you! Thank you for allowing me to participate in your care!  Our plans for today:  -We are performing a COVID-19 test today.  I am writing you out of work until the results come back.  I should have the results back in the next 2 days.  I recommend that you isolate until we have the results back. -If you develop any difficulty breathing, crushing chest pains, inability to keep down food or fluid, dizziness/lightheadedness, or any other concerning symptoms on should go to the emergency department. -It is important to stay hydrated.  Take care and seek immediate care sooner if you develop any concerns.   Dr. Jackelyn Poling, DO [facility]

## 2020-04-19 ENCOUNTER — Other Ambulatory Visit: Payer: Self-pay

## 2020-04-19 ENCOUNTER — Ambulatory Visit
Admission: RE | Admit: 2020-04-19 | Discharge: 2020-04-19 | Disposition: A | Discharge status: Home / Self Care | Payer: BC Managed Care – PPO | Source: Ambulatory Visit | Attending: Plastic Surgery | Admitting: Plastic Surgery

## 2020-04-19 ENCOUNTER — Other Ambulatory Visit: Payer: BC Managed Care – PPO

## 2020-04-19 ENCOUNTER — Ambulatory Visit: Payer: BC Managed Care – PPO

## 2020-04-19 DIAGNOSIS — N644 Mastodynia: Secondary | ICD-10-CM | POA: Diagnosis not present

## 2020-04-20 LAB — NOVEL CORONAVIRUS, NAA: SARS-CoV-2, NAA: DETECTED — AB

## 2020-04-20 LAB — SARS-COV-2, NAA 2 DAY TAT

## 2020-04-25 ENCOUNTER — Telehealth: Payer: Self-pay

## 2020-04-25 NOTE — Telephone Encounter (Signed)
Call to pt per Dr. Ulice Bold- to provide pt with mammogram results as follows: Negative - with 1 year f/u mammogram  Pt reports that the breast pain she was having has reduced in intensity d/t "Neurontin" medication & she denies any need for PT for ROM concerns at this time. She wishes to cancel her next appointment with Dr. Ulice Bold on 05/16/20 because she feels she has improved & she was seen on 04/11/20. She is reminded to call for any changes or concerns- otherwise she will make an appointment for her next yearly mammogram & with Dr. Ulice Bold as needed. I consulted with Dr. Ulice Bold about the above & she is pleased that the patient has improved & she agreed that the pt can call for any issue. Bethany [name] will cancel her upcoming appointment.

## 2020-04-27 ENCOUNTER — Encounter (HOSPITAL_COMMUNITY): Payer: Self-pay | Admitting: Psychiatry

## 2020-04-27 ENCOUNTER — Telehealth (INDEPENDENT_AMBULATORY_CARE_PROVIDER_SITE_OTHER): Payer: BC Managed Care – PPO | Admitting: Psychiatry

## 2020-04-27 ENCOUNTER — Other Ambulatory Visit: Payer: Self-pay

## 2020-04-27 VITALS — Wt 100.0 lb

## 2020-04-27 DIAGNOSIS — F3160 Bipolar disorder, current episode mixed, unspecified: Secondary | ICD-10-CM

## 2020-04-27 DIAGNOSIS — F419 Anxiety disorder, unspecified: Secondary | ICD-10-CM | POA: Diagnosis not present

## 2020-04-27 MED ORDER — HYDROXYZINE PAMOATE 25 MG PO CAPS
25.0000 mg | ORAL_CAPSULE | Freq: Two times a day (BID) | ORAL | 0 refills | Status: DC | PRN
Start: 2020-04-27 — End: 2020-05-29

## 2020-04-27 MED ORDER — ARIPIPRAZOLE 10 MG PO TABS: 10.0000 mg | ORAL_TABLET | Freq: Every day | ORAL | 0 refills | Status: DC
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
## 2020-05-05 ENCOUNTER — Ambulatory Visit: Payer: Self-pay | Admitting: Plastic Surgery

## 2020-05-16 ENCOUNTER — Ambulatory Visit: Payer: Self-pay | Admitting: Plastic Surgery

## 2020-05-19 ENCOUNTER — Other Ambulatory Visit (HOSPITAL_COMMUNITY): Payer: Self-pay | Admitting: Psychiatry

## 2020-05-19 DIAGNOSIS — F419 Anxiety disorder, unspecified: Secondary | ICD-10-CM

## 2020-05-19 DIAGNOSIS — F3160 Bipolar disorder, current episode mixed, unspecified: Secondary | ICD-10-CM

## 2020-05-29 ENCOUNTER — Encounter (HOSPITAL_COMMUNITY): Payer: Self-pay | Admitting: Psychiatry

## 2020-05-29 ENCOUNTER — Other Ambulatory Visit: Payer: Self-pay

## 2020-05-29 ENCOUNTER — Telehealth (INDEPENDENT_AMBULATORY_CARE_PROVIDER_SITE_OTHER): Payer: BC Managed Care – PPO | Admitting: Psychiatry

## 2020-05-29 DIAGNOSIS — F3160 Bipolar disorder, current episode mixed, unspecified: Secondary | ICD-10-CM

## 2020-05-29 DIAGNOSIS — F419 Anxiety disorder, unspecified: Secondary | ICD-10-CM | POA: Diagnosis not present

## 2020-05-29 MED ORDER — HYDROXYZINE PAMOATE 25 MG PO CAPS
25.0000 mg | ORAL_CAPSULE | Freq: Two times a day (BID) | ORAL | 1 refills | Status: DC | PRN
Start: 2020-05-29 — End: 2020-07-18

## 2020-05-29 MED ORDER — ARIPIPRAZOLE 20 MG PO TABS: 20.0000 mg | ORAL_TABLET | Freq: Every day | ORAL | 1 refills | Status: DC

## 2020-05-29 NOTE — Progress Notes (Signed)
Virtual Visit via Telephone Note  I connected with Bethany [name] on 05/29/20 at  2:40 PM EST by telephone and verified that I am speaking with the correct person using two identifiers.  Location: Patient: work Provider: home office   I discussed the limitations, risks, security and privacy concerns of performing an evaluation and management service by telephone and the availability of in person appointments. I also discussed with the patient that there may be a patient responsible charge related to this service. The patient expressed understanding and agreed to proceed.   History of Present Illness: Patient is evaluated by phone session.  We started her on Abilify 10 mg and she is taking every day.  She noticed some improvement but is still struggling with anger, mood swings and paranoia.  Though she denies any suicidal thoughts but gets easily frustrated.  She is taking hydroxyzine which is helping her anxiety.  She like to go back on original dose of Abilify 20 mg which she used to take it in the past without any problems.  She is working at [employer] and job is going well.  Sometimes she gets frustrated with a coworker but there has been no recent bigger issues.  She sleeps okay.  She has no tremors shakes or any EPS.  She admitted 3 pounds weight gain but also admitted not doing exercise or walking.  Her 47 year old daughter lives with her.  Patient denies drinking or using any illegal substances.   Past Psychiatric History:Reviewed. 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 TookZoloft and lorazepam byPCP.Noh/oinpatient treatment, suicidal attempt, psychosis or any mania. H/Oirritability, anger and mood swing. Tried Lamictal but stopped due to rash.   Psychiatric Specialty Exam: Physical Exam  Review of Systems  Weight 110 lb (49.9 kg).There is no height or weight on file to calculate BMI.  General Appearance: NA  Eye Contact:  NA  Speech:  Normal  Rate  Volume:  Normal  Mood:  Irritable  Affect:  NA  Thought Process:  Goal Directed  Orientation:  Full (Time, Place, and Person)  Thought Content:  Rumination  Suicidal Thoughts:  No  Homicidal Thoughts:  No  Memory:  Immediate;   Good Recent;   Good Remote;   Good  Judgement:  Intact  Insight:  Present  Psychomotor Activity:  NA  Concentration:  Concentration: Good and Attention Span: Good  Recall:  Good  Fund of Knowledge:  Good  Language:  Good  Akathisia:  No  Handed:  Right  AIMS (if indicated):     Assets:  Communication Skills Desire for Improvement Housing Resilience Social Support Talents/Skills Transportation  ADL's:  Intact  Cognition:  WNL  Sleep:   fair      Assessment and Plan: Bipolar disorder, mixed type.  Anxiety.  Patient is showing marginal improvement with Abilify but like to go back on 47 mg which she used to take without any problem.  She has residual impulsivity, frustration and irritability.  We will increase Abilify to 47 mg daily and continue hydroxyzine 25 mg twice a day.  We will hold her Zoloft until her next appointment to see if combination of Abilify and hydroxyzine helps together.  I also encourage walking and regular exercise to help her weight gain.  Discussed medication side effects and benefits.  Recommended to call us back if is any question or any concern.  Follow-up in 6 weeks.  Follow Up Instructions:    I discussed the assessment and treatment plan with  the patient. The patient was provided an opportunity to ask questions and all were answered. The patient agreed with the plan and demonstrated an understanding of the instructions.   The patient was advised to call back or seek an in-person evaluation if the symptoms worsen or if the condition fails to improve as anticipated.  I provided 15 minutes of non-face-to-face time during this encounter.   [name], MD

## 2020-06-15 ENCOUNTER — Encounter: Payer: Self-pay | Admitting: Family Medicine

## 2020-06-19 ENCOUNTER — Other Ambulatory Visit: Payer: Self-pay

## 2020-06-19 ENCOUNTER — Other Ambulatory Visit (HOSPITAL_COMMUNITY)
Admission: RE | Admit: 2020-06-19 | Discharge: 2020-06-19 | Disposition: A | Discharge status: Home / Self Care | Payer: BC Managed Care – PPO | Source: Ambulatory Visit | Attending: Family Medicine | Admitting: Family Medicine

## 2020-06-19 ENCOUNTER — Ambulatory Visit (INDEPENDENT_AMBULATORY_CARE_PROVIDER_SITE_OTHER): Payer: BC Managed Care – PPO | Admitting: Family Medicine

## 2020-06-19 ENCOUNTER — Ambulatory Visit (INDEPENDENT_AMBULATORY_CARE_PROVIDER_SITE_OTHER): Payer: BC Managed Care – PPO

## 2020-06-19 DIAGNOSIS — Z113 Encounter for screening for infections with a predominantly sexual mode of transmission: Secondary | ICD-10-CM

## 2020-06-19 DIAGNOSIS — E162 Hypoglycemia, unspecified: Secondary | ICD-10-CM | POA: Diagnosis not present

## 2020-06-19 DIAGNOSIS — Z124 Encounter for screening for malignant neoplasm of cervix: Secondary | ICD-10-CM | POA: Insufficient documentation

## 2020-06-19 DIAGNOSIS — Z23 Encounter for immunization: Secondary | ICD-10-CM | POA: Diagnosis not present

## 2020-06-19 DIAGNOSIS — B373 Candidiasis of vulva and vagina: Secondary | ICD-10-CM | POA: Diagnosis not present

## 2020-06-19 DIAGNOSIS — B9689 Other specified bacterial agents as the cause of diseases classified elsewhere: Secondary | ICD-10-CM | POA: Diagnosis not present

## 2020-06-19 DIAGNOSIS — R7989 Other specified abnormal findings of blood chemistry: Secondary | ICD-10-CM

## 2020-06-19 DIAGNOSIS — Z1159 Encounter for screening for other viral diseases: Secondary | ICD-10-CM

## 2020-06-19 DIAGNOSIS — N760 Acute vaginitis: Secondary | ICD-10-CM | POA: Diagnosis not present

## 2020-06-19 MED ORDER — GABAPENTIN 100 MG PO CAPS: 200.0000 mg | ORAL_CAPSULE | Freq: Three times a day (TID) | ORAL | 3 refills | Status: DC

## 2020-06-19 NOTE — Patient Instructions (Signed)
It was wonderful seeing you today.  We collected a Pap smear as well as STI screening.  Those results will go to your MyChart.  Regarding your concerns about hypoglycemia I am going to send you to see an endocrinologist.  We are also collecting lab work today to check your kidney function as well as your blood glucose.  If you have any questions, concerns please will free to call the clinic.  I hope you have a wonderful afternoon!

## 2020-06-19 NOTE — Progress Notes (Signed)
    SUBJECTIVE:   CHIEF COMPLAINT / HPI:   Pap smear with STI screening Patient reports for cervical cancer screening as well as STI screening.  She wants blood test as well as gonorrhea, chlamydia, trichomoniasis, BV, yeast.  She is sexually active with one partner.  Denies any symptoms of discharge, foul odor.  Recurrent hypoglycemic episode Patient reports continued issues with hypoglycemia.  She reports that she was in [place] approximately 1 year ago after having a hypoglycemic episode.  She checks her blood sugars in the morning and she will wake up with blood sugars in the 40s and feels like her blood sugar is lower.  She reports she had 6 more meals a day.  She has not seen endocrinology for this and would like to see them.  OBJECTIVE:   There were no vitals taken for this visit.  General: Well-appearing 45 year old female, no acute distress Cardiac: Regular rate and rhythm, no murmurs appreciated Respiratory: Normal work of breathing, clear to clear bilaterally Abdomen: Soft, nontender, positive bowel sounds GU: Normal external vaginal tissue, normal internal vaginal tissue without erythema, moderate amount of white discharge noted, no cervical erythema, irritation, friability.  ASSESSMENT/PLAN:   Routine cervical smear Pap smear completed today, also collected Optima swab to check for STIs.  Also collected HIV and RPR.  Will call patient with results if any abnormalities are found.  Hypoglycemia, unspecified Patient reports continued issues with hypoglycemia.  She takes fasting blood sugars in the morning which are sometimes in the 40s and she feels like her blood sugar is low.  She reports weakness, lethargy.  Improves with eating.  She ate 6 more meals a day.  Would like a referral to endocrinology to further discuss this.  Referral placed.  Collecting BMP today.  Strict ED return precautions given.     Gifford Shave, MD [place]

## 2020-06-20 ENCOUNTER — Telehealth: Payer: Self-pay | Admitting: Family Medicine

## 2020-06-20 DIAGNOSIS — Z124 Encounter for screening for malignant neoplasm of cervix: Secondary | ICD-10-CM

## 2020-06-20 HISTORY — DX: Encounter for screening for malignant neoplasm of cervix: Z12.4

## 2020-06-20 LAB — CERVICOVAGINAL ANCILLARY ONLY
Bacterial Vaginitis (gardnerella): POSITIVE — AB
Candida Glabrata: NEGATIVE
Candida Vaginitis: POSITIVE — AB
Chlamydia: NEGATIVE
Comment: NEGATIVE
Comment: NEGATIVE
Comment: NEGATIVE
Comment: NEGATIVE
Comment: NEGATIVE
Comment: NORMAL
Neisseria Gonorrhea: NEGATIVE
Trichomonas: NEGATIVE

## 2020-06-20 LAB — BASIC METABOLIC PANEL
BUN/Creatinine Ratio: 8 — ABNORMAL LOW (ref 9–23)
BUN: 7 mg/dL (ref 6–24)
CO2: 22 mmol/L (ref 20–29)
Calcium: 9.5 mg/dL (ref 8.7–10.2)
Chloride: 102 mmol/L (ref 96–106)
Creatinine, Ser: 0.91 mg/dL (ref 0.57–1.00)
Glucose: 86 mg/dL (ref 65–99)
Potassium: 4.4 mmol/L (ref 3.5–5.2)
Sodium: 140 mmol/L (ref 134–144)
eGFR: 79 mL/min/{1.73_m2} (ref >59)

## 2020-06-20 LAB — RPR: RPR Ser Ql: NONREACTIVE

## 2020-06-20 LAB — HIV ANTIBODY (ROUTINE TESTING W REFLEX): HIV Screen 4th Generation wRfx: NONREACTIVE

## 2020-06-20 MED ORDER — FLUCONAZOLE 150 MG PO TABS: 150.0000 mg | ORAL_TABLET | Freq: Once | ORAL | 0 refills | Status: AC

## 2020-06-20 MED ORDER — METRONIDAZOLE 500 MG PO TABS
500.0000 mg | ORAL_TABLET | Freq: Two times a day (BID) | ORAL | 0 refills | Status: DC
Start: 2020-06-20 — End: 2021-06-21

## 2020-06-20 NOTE — Assessment & Plan Note (Signed)
Patient reports continued issues with hypoglycemia.  She takes fasting blood sugars in the morning which are sometimes in the 40s and she feels like her blood sugar is low.  She reports weakness, lethargy.  Improves with eating.  She ate 6 more meals a day.  Would like a referral to endocrinology to further discuss this.  Referral placed.  Collecting BMP today.  Strict ED return precautions given.

## 2020-06-20 NOTE — Telephone Encounter (Signed)
Called patient regarding positive BV and yeast. Prescriptions sent in for metronidazole and diflucan.

## 2020-06-20 NOTE — Assessment & Plan Note (Signed)
Pap smear completed today, also collected Optima swab to check for STIs.  Also collected HIV and RPR.  Will call patient with results if any abnormalities are found.

## 2020-06-20 NOTE — Telephone Encounter (Signed)
Started phone note in error

## 2020-06-21 LAB — CYTOLOGY - PAP
Comment: NEGATIVE
Diagnosis: NEGATIVE
High risk HPV: NEGATIVE

## 2020-06-23 ENCOUNTER — Other Ambulatory Visit (HOSPITAL_COMMUNITY): Payer: Self-pay | Admitting: Psychiatry

## 2020-06-23 DIAGNOSIS — F419 Anxiety disorder, unspecified: Secondary | ICD-10-CM

## 2020-06-23 DIAGNOSIS — F3160 Bipolar disorder, current episode mixed, unspecified: Secondary | ICD-10-CM

## 2020-07-10 ENCOUNTER — Telehealth (HOSPITAL_COMMUNITY): Payer: BC Managed Care – PPO | Admitting: Psychiatry

## 2020-07-11 ENCOUNTER — Other Ambulatory Visit: Payer: Self-pay

## 2020-07-11 ENCOUNTER — Telehealth (HOSPITAL_COMMUNITY): Payer: BC Managed Care – PPO | Admitting: Psychiatry

## 2020-07-18 ENCOUNTER — Other Ambulatory Visit: Payer: Self-pay

## 2020-07-18 ENCOUNTER — Telehealth (INDEPENDENT_AMBULATORY_CARE_PROVIDER_SITE_OTHER): Payer: BC Managed Care – PPO | Admitting: Psychiatry

## 2020-07-18 ENCOUNTER — Encounter (HOSPITAL_COMMUNITY): Payer: Self-pay | Admitting: Psychiatry

## 2020-07-18 DIAGNOSIS — F3160 Bipolar disorder, current episode mixed, unspecified: Secondary | ICD-10-CM | POA: Diagnosis not present

## 2020-07-18 DIAGNOSIS — F419 Anxiety disorder, unspecified: Secondary | ICD-10-CM | POA: Diagnosis not present

## 2020-07-18 MED ORDER — HYDROXYZINE PAMOATE 25 MG PO CAPS: 25.0000 mg | ORAL_CAPSULE | Freq: Three times a day (TID) | ORAL | 1 refills | Status: DC

## 2020-07-18 MED ORDER — ARIPIPRAZOLE 30 MG PO TABS: 30.0000 mg | ORAL_TABLET | Freq: Every day | ORAL | 1 refills | Status: DC

## 2020-07-18 NOTE — Progress Notes (Signed)
Virtual Visit via Telephone Note  I connected with Bethany [name] on 07/18/20 at 10:20 AM EDT by telephone and verified that I am speaking with the correct person using two identifiers.  Location: Patient: Work Provider: Biomedical scientist   I discussed the limitations, risks, security and privacy concerns of performing an evaluation and management service by telephone and the availability of in person appointments. I also discussed with the patient that there may be a patient responsible charge related to this service. The patient expressed understanding and agreed to proceed.   History of Present Illness:  Patient is evaluated by phone session.  We increase Abilify on the last visit and now she is taking 20 mg.  She noticed improvement in her mood irritability but that did not last long and she felt those need to be further increased.  She reported her anger gets 0-100 in few seconds.  She admitted racing thoughts, getting easily frustrated, using profanity with the coworkers and tried to keep herself busy.  However she reported not a major incidents and wondering if dose can be further increased.  She admitted 3 pounds weight gain and she is happy because she like to have a weight gain.  She also started exercise 3 days a week.  She continues to work at [employer] first shift.  She like her job but sometime she gets frustrated for no reason.  She denies drinking, smoking or any using illegal substances.  Her 15 year old daughter lives with her and she gets along with her.  She is not interested in therapy.  She denies any hallucination, paranoia or any suicidal thoughts.  Past Psychiatric History:Reviewed. 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 TookZoloft and lorazepam byPCP.Noh/oinpatient treatment, suicidal attempt, psychosis or any mania. H/Oirritability, anger and mood swing. Tried Lamictal but stopped due to rash.   Psychiatric Specialty Exam: Physical  Exam  Review of Systems  Weight 115 lb 8 oz (52.4 kg).There is no height or weight on file to calculate BMI.  General Appearance: NA  Eye Contact:  NA  Speech:  Clear and Coherent  Volume:  Normal  Mood:  Irritable  Affect:  NA  Thought Process:  Descriptions of Associations: Intact  Orientation:  Full (Time, Place, and Person)  Thought Content:  Rumination  Suicidal Thoughts:  No  Homicidal Thoughts:  No  Memory:  Immediate;   Good Recent;   Good Remote;   Good  Judgement:  Intact  Insight:  Shallow  Psychomotor Activity:  NA  Concentration:  Concentration: Fair and Attention Span: Fair  Recall:  Good  Fund of Knowledge:  Good  Language:  Good  Akathisia:  No  Handed:  Right  AIMS (if indicated):     Assets:  Communication Skills Desire for Improvement Housing Resilience Social Support Talents/Skills Transportation  ADL's:  Intact  Cognition:  WNL  Sleep:   5-6 hrs      Assessment and Plan: Bipolar disorder, mixed type.  Anxiety.  Patient like to increase the Abilify.  Currently she is taking 20 mg.  We will try 30 mg however I also reminded that increase Abilify can cause EPS, metabolic syndrome.  Encouraged to keep exercise 3 days a week.  I also increase hydroxyzine 25 mg 3 times a day.  She noticed it helps but does not stay long.  She is also prescribed gabapentin for her neuropathy pain which is stable.  She used to take Zoloft however we will hold Zoloft until see improvement with  current medication adjustment.  Follow-up in 4 weeks.  Patient is not interested in therapy.  I recommend to call us back if is any question or any concern.  Follow Up Instructions:    I discussed the assessment and treatment plan with the patient. The patient was provided an opportunity to ask questions and all were answered. The patient agreed with the plan and demonstrated an understanding of the instructions.   The patient was advised to call back or seek an in-person  evaluation if the symptoms worsen or if the condition fails to improve as anticipated.  I provided 19 minutes of non-face-to-face time during this encounter.   [name], MD

## 2020-08-02 ENCOUNTER — Other Ambulatory Visit (HOSPITAL_COMMUNITY): Payer: Self-pay | Admitting: Psychiatry

## 2020-08-02 DIAGNOSIS — F419 Anxiety disorder, unspecified: Secondary | ICD-10-CM

## 2020-08-02 DIAGNOSIS — F3160 Bipolar disorder, current episode mixed, unspecified: Secondary | ICD-10-CM

## 2020-08-03 ENCOUNTER — Ambulatory Visit: Payer: BC Managed Care – PPO | Admitting: Endocrinology

## 2020-08-09 ENCOUNTER — Other Ambulatory Visit (HOSPITAL_COMMUNITY): Payer: Self-pay | Admitting: Psychiatry

## 2020-08-09 DIAGNOSIS — F3160 Bipolar disorder, current episode mixed, unspecified: Secondary | ICD-10-CM

## 2020-08-29 ENCOUNTER — Telehealth (HOSPITAL_COMMUNITY): Payer: BC Managed Care – PPO | Admitting: Psychiatry

## 2020-08-29 ENCOUNTER — Other Ambulatory Visit: Payer: Self-pay

## 2020-09-29 ENCOUNTER — Encounter (HOSPITAL_COMMUNITY): Payer: Self-pay

## 2020-09-29 ENCOUNTER — Emergency Department (HOSPITAL_COMMUNITY)
Admission: EM | Admit: 2020-09-29 | Discharge: 2020-09-29 | Discharge status: Against Medical Advice | Payer: BC Managed Care – PPO | Attending: Emergency Medicine | Admitting: Emergency Medicine

## 2020-09-29 ENCOUNTER — Other Ambulatory Visit: Payer: Self-pay

## 2020-09-29 DIAGNOSIS — L299 Pruritus, unspecified: Secondary | ICD-10-CM | POA: Insufficient documentation

## 2020-09-29 DIAGNOSIS — Z5321 Procedure and treatment not carried out due to patient leaving prior to being seen by health care provider: Secondary | ICD-10-CM | POA: Diagnosis not present

## 2020-09-29 NOTE — ED Triage Notes (Signed)
Pt presents from home via [facility], states she was started on a new pain medication yesterday called Nucynta and has been itching since taking it at 10pm.

## 2020-10-13 ENCOUNTER — Other Ambulatory Visit: Payer: Self-pay

## 2020-10-13 ENCOUNTER — Ambulatory Visit: Payer: BC Managed Care – PPO | Admitting: Endocrinology

## 2020-10-13 DIAGNOSIS — R739 Hyperglycemia, unspecified: Secondary | ICD-10-CM

## 2020-10-13 MED ORDER — PIOGLITAZONE HCL 15 MG PO TABS: 15.0000 mg | ORAL_TABLET | Freq: Every day | ORAL | 3 refills | Status: DC

## 2020-10-13 NOTE — Patient Instructions (Addendum)
I have sent a prescription to your pharmacy, for pioglitazone. Please come back for a follow-up appointment in 3 months.

## 2020-10-13 NOTE — Progress Notes (Signed)
Subjective:    Patient ID: Bethany [name], female    DOB: [birth year], 45 y.o.   MRN: [id number]  HPI Pt is referred by Dr Erin Hearing, for poss hypoglycemia.  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  she brings her meter with her cbg's which I have reviewed today.  Cbg varies from 72-439.  She says cbg goes highest after eating.  She has tremor and sweating if cbg goes over 200.  She takes no medication for the blood sugar.  She has never had weight loss surgery, alcohol intake, adrenal problems, pituitary problems, liver problems, or kidney problems.  She has had endometrial ablation.     Past Medical History:  Diagnosis Date   Anemia    Anxiety    COVID-19    Depression    Fibrocystic disease of both breasts    History of cervical dysplasia    laser surgery   Insomnia    Low blood sugar 2001   pt checks BS in AM, controls with diet   Vaginal Pap smear, abnormal     Past Surgical History:  Procedure Laterality Date   BREAST FIBROADENOMA SURGERY  2011, 2008   benign    BREAST RECONSTRUCTION WITH PLACEMENT OF TISSUE EXPANDER AND FLEX HD (ACELLULAR HYDRATED DERMIS) Bilateral 10/19/2018   Procedure: BREAST RECONSTRUCTION WITH PLACEMENT OF TISSUE EXPANDER AND FLEX HD (ACELLULAR HYDRATED DERMIS);  Surgeon: Wallace Going, DO;  Location: [facility];  Service: Plastics;  Laterality: Bilateral;   [place] N/A 01/02/2016   Procedure: NOVASURE;  Surgeon: Woodroe Mode, MD;  Location: [facility];  Service: Gynecology;  Laterality: N/A;   NIPPLE SPARING MASTECTOMY Bilateral 10/19/2018   Procedure: BILATERAL NIPPLE SPARING MASTECTOMY;  Surgeon: Jovita Kussmaul, MD;  Location: [facility];  Service: General;  Laterality: Bilateral;   REMOVAL OF BILATERAL TISSUE EXPANDERS WITH PLACEMENT OF BILATERAL BREAST IMPLANTS Bilateral 12/30/2018   Procedure: REMOVAL OF BILATERAL TISSUE EXPANDERS WITH PLACEMENT OF BILATERAL BREAST IMPLANTS;  Surgeon: Wallace Going, DO;  Location: [facility];  Service: Plastics;  Laterality: Bilateral;   TUBAL LIGATION  2003    Social History   Socioeconomic History   Marital status: Married    Spouse name: Not on file   Number of children: Not on file   Years of education: Not on file   Highest education level: Not on file  Occupational History   Not on file  Tobacco Use   Smoking status: Never   Smokeless tobacco: Never  Vaping Use   Vaping Use: Never used  Substance and Sexual Activity   Alcohol use: No    Alcohol/week: 0.0 standard drinks   Drug use: No   Sexual activity: Yes    Partners: Male    Birth control/protection: Surgical  Other Topics Concern   Not on file  Social History Narrative   Married, 3 daughters.  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         Social Determinants of Health   Financial Resource Strain: Not on file  Food Insecurity: Not on file  Transportation Needs: Not on file  Physical Activity: Not on file  Stress: Not on file  Social Connections: Not on file  Intimate Partner Violence: Not on file    Current Outpatient Medications on File Prior to Visit  Medication Sig Dispense Refill   ARIPiprazole (ABILIFY) 30 MG tablet Take 1 tablet (30 mg  total) by mouth daily. 30 tablet 1   gabapentin (NEURONTIN) 100 MG capsule Take 2 capsules (200 mg total) by mouth 3 (three) times daily. 540 capsule 3   hydrOXYzine (VISTARIL) 25 MG capsule Take 1 capsule (25 mg total) by mouth 3 (three) times daily. 90 capsule 1   metroNIDAZOLE (FLAGYL) 500 MG tablet Take 1 tablet (500 mg total) by mouth 2 (two) times daily. 14 tablet 0   No current facility-administered medications on file prior to visit.    Allergies  Allergen Reactions   Lamictal [Lamotrigine] Rash   Codeine Hives   Oxycodone Hcl Hives    Family History  Problem Relation Age of Onset   Anxiety disorder Father    Depression Father    Diabetes Maternal Grandmother     Hypertension Paternal Grandfather    Cancer Maternal Aunt        breast   Breast cancer Maternal Aunt    Alcohol abuse Maternal Uncle    Alcohol abuse Cousin    Drug abuse Cousin    Stroke Neg Hx     BP 118/76 (BP Location: Right Arm, Patient Position: Sitting, Cuff Size: Normal)   Pulse 98   Ht 5\' 4"  (1.626 m)   Wt 153 lb 12.8 oz (69.8 kg)   SpO2 97%   BMI 26.40 kg/m    Review of Systems denies headache, diarrhea, palpitations, rash, sob, n/v, seizure, or LOC.  She has weight gain    Objective:   Physical Exam VS: see vs page GEN: no distress HEAD: head: no deformity eyes: no periorbital swelling, no proptosis external nose and ears are normal NECK: supple, thyroid is not enlarged CHEST WALL: no deformity LUNGS: clear to auscultation CV: reg rate and rhythm, no murmur.  MUSCULOSKELETAL: gait is steady, with a cane EXTEMITIES: no deformity.  no leg edema NEURO:  readily moves all 4's.  sensation is intact to touch on all 4's SKIN:  Normal texture and temperature.  No rash or suspicious lesion is visible.   NODES:  None palpable at the neck PSYCH: alert, well-oriented.  Does not appear anxious nor depressed.  CT (2016): normal pancreas and adrenals.   Lab Results  Component Value Date   HGBA1C 5.4 09/19/2010   Lab Results  Component Value Date   ALT 11 07/07/2019   AST 19 07/07/2019   ALKPHOS 38 07/07/2019   BILITOT 0.5 07/07/2019   Lab Results  Component Value Date   CREATININE 0.91 06/19/2020   BUN 7 06/19/2020   NA 140 06/19/2020   K 4.4 06/19/2020   CL 102 06/19/2020   CO2 22 06/19/2020   I have reviewed outside records, and summarized: Pt was noted to have labile glucose and above sxs, and referred here.  However, it was also noted that hypoglycemia was never actually noted    Assessment & Plan:  Labile glucose, new to me.  She will evolve DM Sxs as above.  These could be due t the above.   Patient Instructions  I have sent a prescription to  your pharmacy, for pioglitazone. Please come back for a follow-up appointment in 3 months.

## 2020-10-14 DIAGNOSIS — R739 Hyperglycemia, unspecified: Secondary | ICD-10-CM | POA: Insufficient documentation

## 2020-10-14 HISTORY — DX: Hyperglycemia, unspecified: R73.9

## 2020-10-17 ENCOUNTER — Telehealth: Payer: Self-pay | Admitting: Pharmacy Technician

## 2020-10-17 NOTE — Telephone Encounter (Addendum)
Patient Advocate Encounter   Received notification from [facility] that prior authorization for PIOGLITAZONE is required.   PA submitted on 10/17/2020 Key [id number] Status is denied.  This patient's plan wants her to try one of the following meds before they will approve Pioglitazone...  Ozempic, Rybelsus, Trulicity, Victoza, Janumet, Janumet XR, Januvia, Farxiga, [place], [place] (including xr), or [place].  Provider messaged.     [facility] will continue to follow.   Venida Jarvis. Nadara Mustard, CPhT Patient Advocate [facility] Phone: [phone] Fax:  [phone]

## 2020-10-21 DIAGNOSIS — N911 Secondary amenorrhea: Secondary | ICD-10-CM | POA: Diagnosis not present

## 2020-10-21 DIAGNOSIS — R300 Dysuria: Secondary | ICD-10-CM | POA: Diagnosis not present

## 2020-10-27 ENCOUNTER — Telehealth: Payer: Self-pay | Admitting: Endocrinology

## 2020-10-27 NOTE — Telephone Encounter (Signed)
-----   Message from Orpah Melter, CPhT sent at 10/27/2020  8:15 AM EDT ----- Regarding: Change med? Hey Doctor,  This patient's plan wants her to try one of the following meds before they will approved Pioglitazone  Ozempic, Rybelsus, Trulicity, Victoza, Janumet, Janumet XR, Januvia, Farxiga, [place], [place] (including xr), or [place].  Bethany [name]. Nadara Mustard, CPhT Patient Advocate [facility] Phone: [phone] Fax:  [phone]

## 2020-10-27 NOTE — Telephone Encounter (Signed)
These other meds are not appropriate for this situation.  Pt should buy without insurance.

## 2020-10-31 ENCOUNTER — Other Ambulatory Visit (HOSPITAL_COMMUNITY): Payer: Self-pay | Admitting: Psychiatry

## 2020-10-31 DIAGNOSIS — F419 Anxiety disorder, unspecified: Secondary | ICD-10-CM

## 2020-10-31 DIAGNOSIS — F3160 Bipolar disorder, current episode mixed, unspecified: Secondary | ICD-10-CM

## 2020-11-01 ENCOUNTER — Ambulatory Visit: Payer: BC Managed Care – PPO

## 2020-11-01 ENCOUNTER — Telehealth (INDEPENDENT_AMBULATORY_CARE_PROVIDER_SITE_OTHER): Payer: BC Managed Care – PPO | Admitting: Family Medicine

## 2020-11-01 ENCOUNTER — Other Ambulatory Visit: Payer: Self-pay

## 2020-11-01 ENCOUNTER — Other Ambulatory Visit: Payer: Self-pay | Admitting: Family Medicine

## 2020-11-01 ENCOUNTER — Telehealth: Payer: BC Managed Care – PPO | Admitting: Family Medicine

## 2020-11-01 DIAGNOSIS — N390 Urinary tract infection, site not specified: Secondary | ICD-10-CM | POA: Diagnosis not present

## 2020-11-01 DIAGNOSIS — R300 Dysuria: Secondary | ICD-10-CM

## 2020-11-01 HISTORY — DX: Urinary tract infection, site not specified: N39.0

## 2020-11-01 LAB — POCT URINALYSIS DIP (MANUAL ENTRY)
Bilirubin, UA: NEGATIVE
Glucose, UA: NEGATIVE mg/dL
Ketones, POC UA: NEGATIVE mg/dL
Nitrite, UA: NEGATIVE
Protein Ur, POC: NEGATIVE mg/dL
Spec Grav, UA: 1.02 (ref 1.010–1.025)
Urobilinogen, UA: 0.2 E.U./dL
pH, UA: 7 (ref 5.0–8.0)

## 2020-11-01 LAB — POCT UA - MICROSCOPIC ONLY

## 2020-11-01 MED ORDER — CIPROFLOXACIN HCL 500 MG PO TABS: 500.0000 mg | ORAL_TABLET | Freq: Two times a day (BID) | ORAL | 0 refills | Status: AC

## 2020-11-01 NOTE — Assessment & Plan Note (Addendum)
Pt recently treated for UTI with 7 days antibiotics (pt unsure which one) with no improvement in symptoms. Recent urine culture grew E.coli. UA and urine culture obtained today but still pending. Pt reports allergy to penicillin where experiences hives. Will treat with 5 days of Cipro 500mg  BID. Discussed side effects such GI upset, tendinitis etc. Return precautions given to patient. Will call her with sensitivities once they result.

## 2020-11-01 NOTE — Progress Notes (Signed)
Patient presented to the clinic today for dysuria but unfortunately was having signs and symptoms of COVID.  She was able to give [name] a urine sample and is scheduled for a virtual visit this afternoon with Dr. Posey Pronto.  Dr. Posey Pronto can evaluate the patient and will have the urine results so we will be able to determine if there is need for antibiotics.

## 2020-11-01 NOTE — Progress Notes (Addendum)
[facility] Tele[facility]icine Visit  Patient consented to have virtual visit and was identified by name and date of birth. Method of visit: Telephone  Encounter participants: Patient: Bethany [name] - located at home Provider: Lattie Haw - located at home   Chief Complaint: dysuria   HPI:  Urinary sx Went to [facility] medical urgent care 1 week ago for urinary sx. Her urine culture grew e.coli. She completed a 7 day course for an antibiotic but is unsure of then name. She says it begins with "F'. Pt is still having dysuria. Otherwise feels well in herself. Denies fevers, lower abdominal pain, back pain, frequency, urgency or hematuria.  Pt reports hx of UTI before but unsure which medications had worked for her in the past.   Could not be seen in clinic today due to new sore throat, provided urine sample to lab.  ROS: per HPI  Pertinent PMHx: migraine, mastectomy, mood disorder  Exam:  There were no vitals taken for this visit.  Respiratory: speaking in full sentences  Assessment/Plan:  UTI (urinary tract infection) Pt recently treated for UTI with 7 days antibiotics (pt unsure which one) with no improvement in symptoms. Recent urine culture grew E.coli. UA and urine culture obtained today but still pending. Pt reports allergy to penicillin where experiences hives. Will treat with 5 days of Cipro 500mg  BID. Discussed side effects such GI upset, tendinitis etc. Return precautions given to patient. Will call her with sensitivities once they result.    Time spent during visit with patient: 15 minutes

## 2020-12-15 ENCOUNTER — Other Ambulatory Visit (HOSPITAL_COMMUNITY): Payer: Self-pay | Admitting: Psychiatry

## 2020-12-15 DIAGNOSIS — F3160 Bipolar disorder, current episode mixed, unspecified: Secondary | ICD-10-CM

## 2020-12-15 DIAGNOSIS — F419 Anxiety disorder, unspecified: Secondary | ICD-10-CM

## 2021-01-05 ENCOUNTER — Ambulatory Visit: Payer: BC Managed Care – PPO | Admitting: Endocrinology

## 2021-02-07 ENCOUNTER — Other Ambulatory Visit (HOSPITAL_COMMUNITY): Payer: Self-pay | Admitting: Psychiatry

## 2021-02-07 DIAGNOSIS — F3160 Bipolar disorder, current episode mixed, unspecified: Secondary | ICD-10-CM

## 2021-02-07 DIAGNOSIS — F419 Anxiety disorder, unspecified: Secondary | ICD-10-CM

## 2021-04-25 ENCOUNTER — Other Ambulatory Visit: Payer: Self-pay | Admitting: Plastic Surgery

## 2021-04-25 DIAGNOSIS — Z1231 Encounter for screening mammogram for malignant neoplasm of breast: Secondary | ICD-10-CM

## 2021-05-16 ENCOUNTER — Ambulatory Visit
Admission: RE | Admit: 2021-05-16 | Discharge: 2021-05-16 | Disposition: A | Discharge status: Home / Self Care | Payer: BC Managed Care – PPO | Source: Ambulatory Visit | Attending: Plastic Surgery | Admitting: Plastic Surgery

## 2021-05-16 DIAGNOSIS — Z1231 Encounter for screening mammogram for malignant neoplasm of breast: Secondary | ICD-10-CM

## 2021-06-20 ENCOUNTER — Ambulatory Visit (INDEPENDENT_AMBULATORY_CARE_PROVIDER_SITE_OTHER): Payer: BC Managed Care – PPO

## 2021-06-20 ENCOUNTER — Other Ambulatory Visit: Payer: Self-pay

## 2021-06-20 ENCOUNTER — Encounter: Payer: Self-pay | Admitting: Family Medicine

## 2021-06-20 ENCOUNTER — Other Ambulatory Visit (HOSPITAL_COMMUNITY)
Admission: RE | Admit: 2021-06-20 | Discharge: 2021-06-20 | Disposition: A | Discharge status: Home / Self Care | Payer: BC Managed Care – PPO | Source: Ambulatory Visit | Attending: Family Medicine | Admitting: Family Medicine

## 2021-06-20 ENCOUNTER — Ambulatory Visit (INDEPENDENT_AMBULATORY_CARE_PROVIDER_SITE_OTHER): Payer: BC Managed Care – PPO | Admitting: Family Medicine

## 2021-06-20 VITALS — BP 122/85 | HR 77 | Ht 64.0 in | Wt 151.2 lb

## 2021-06-20 DIAGNOSIS — Z23 Encounter for immunization: Secondary | ICD-10-CM

## 2021-06-20 DIAGNOSIS — Z1211 Encounter for screening for malignant neoplasm of colon: Secondary | ICD-10-CM

## 2021-06-20 DIAGNOSIS — Z114 Encounter for screening for human immunodeficiency virus [HIV]: Secondary | ICD-10-CM | POA: Diagnosis not present

## 2021-06-20 DIAGNOSIS — Z1159 Encounter for screening for other viral diseases: Secondary | ICD-10-CM

## 2021-06-20 DIAGNOSIS — E785 Hyperlipidemia, unspecified: Secondary | ICD-10-CM | POA: Diagnosis not present

## 2021-06-20 DIAGNOSIS — Z113 Encounter for screening for infections with a predominantly sexual mode of transmission: Secondary | ICD-10-CM | POA: Diagnosis not present

## 2021-06-20 DIAGNOSIS — R739 Hyperglycemia, unspecified: Secondary | ICD-10-CM | POA: Diagnosis not present

## 2021-06-20 LAB — POCT GLYCOSYLATED HEMOGLOBIN (HGB A1C): Hemoglobin A1C: 6 % — AB (ref 4.0–5.6)

## 2021-06-20 NOTE — Patient Instructions (Signed)
It was great seeing you today!  I am glad you are doing so well.  We are collecting STD screens as well as checking your cholesterol and screening for diabetes.  I will send you a MyChart messages regarding all these results if everything comes back normal and if there are any abnormalities I will call you.  Regarding checking her blood sugars from my perspective you do not need to check your blood sugar unless you are feeling poorly.  Please let me know if you have any issues or concerns.  I hope you have a great afternoon! ? ? ?

## 2021-06-20 NOTE — Progress Notes (Signed)
? ? ?SUBJECTIVE:  ? ?Chief compliant/HPI: annual examination ? ?Bethany [name] is a 46 y.o. who presents today for an annual exam.  She has no major concerns today.  She reports that her blood sugars have been doing well and she has not had any hypoglycemic events.  She would like STD screening today.  She presents with her partner who she recently married. ? ?History tabs reviewed and updated.  ? ?Review of systems form reviewed and notable for no notable events.  ? ?OBJECTIVE:  ? ?BP 122/85   Pulse 77   Ht _0  (1.626 m)   Wt 151 lb 3.2 oz (68.6 kg)   SpO2 100%   BMI 25.95 kg/m?   ?Physical Exam ?Exam conducted with a chaperone present.  ?Constitutional:   ?   Appearance: Normal appearance.  ?HENT:  ?   Head: Normocephalic and atraumatic.  ?   Right Ear: External ear normal.  ?   Left Ear: External ear normal.  ?   Nose: Nose normal. No congestion or rhinorrhea.  ?   Mouth/Throat:  ?   Mouth: Mucous membranes are moist.  ?   Pharynx: No oropharyngeal exudate.  ?Eyes:  ?   Extraocular Movements: Extraocular movements intact.  ?   Pupils: Pupils are equal, round, and reactive to light.  ?Cardiovascular:  ?   Rate and Rhythm: Normal rate and regular rhythm.  ?   Pulses: Normal pulses.  ?   Heart sounds: Normal heart sounds. No murmur heard. ?Pulmonary:  ?   Effort: Pulmonary effort is normal.  ?   Breath sounds: Normal breath sounds.  ?Abdominal:  ?   General: Abdomen is flat.  ?   Palpations: Abdomen is soft.  ?Genitourinary: ?   General: Normal vulva.  ?   Vagina: No vaginal discharge, erythema, tenderness or bleeding.  ?   Cervix: Normal.  ?Musculoskeletal:     ?   General: No swelling or tenderness. Normal range of motion.  ?   Cervical back: Normal range of motion.  ?Skin: ?   General: Skin is warm and dry.  ?   Capillary Refill: Capillary refill takes less than 2 seconds.  ?Neurological:  ?   General: No focal deficit present.  ?   Mental Status: She is alert and oriented to person, place, and time.  Mental status is at baseline.  ?   Cranial Nerves: No cranial nerve deficit.  ?Psychiatric:     ?   Mood and Affect: Mood normal.     ?   Behavior: Behavior normal.  ?  ? ?ASSESSMENT/PLAN:  ? ?No problem-specific Assessment & Plan notes found for this encounter. ?  ? ?Annual Examination  ?See AVS for age appropriate recommendations.   ?PHQ score 0, reviewed and discussed.  ?Blood pressure reviewed and at goal .  ?Asked about intimate partner violence and resources given as appropriate  ?The patient currently uses nothing. Post menopausal.  Folate recommended as appropriate, minimum of 400 mcg per day.  ? ?Considered the following items based upon USPSTF recommendations: ?Diabetes screening: ordered ?Screening for elevated cholesterol: ordered ?HIV testing: ordered ?Hepatitis C: discussed ?Hepatitis B: discussed ?Syphilis if at high risk: ordered ?GC/CT  discussed with patient and ordered ?Reviewed risk factors for latent tuberculosis and not indicated ? ?Discussed family history, BRCA testing not indicated. ?Cervical cancer screening: prior Pap reviewed, repeat due in 06/20/2023 ?Breast cancer screening: Patient with history of double mastectomy. ?Colorectal cancer screening: discussed, colonoscopy ordered if age  86 or over.  ? ?Follow up in 1 year or sooner if indicated.  ? ? ?Gifford Shave, MD ?[place]   ?

## 2021-06-21 ENCOUNTER — Telehealth: Payer: Self-pay | Admitting: Family Medicine

## 2021-06-21 LAB — CERVICOVAGINAL ANCILLARY ONLY
Bacterial Vaginitis (gardnerella): POSITIVE — AB
Candida Glabrata: NEGATIVE
Candida Vaginitis: POSITIVE — AB
Chlamydia: NEGATIVE
Comment: NEGATIVE
Comment: NEGATIVE
Comment: NEGATIVE
Comment: NEGATIVE
Comment: NEGATIVE
Comment: NORMAL
Neisseria Gonorrhea: NEGATIVE
Trichomonas: NEGATIVE

## 2021-06-21 LAB — RPR: RPR Ser Ql: NONREACTIVE

## 2021-06-21 LAB — LIPID PANEL
Chol/HDL Ratio: 3.2 ratio (ref 0.0–4.4)
Cholesterol, Total: 215 mg/dL — ABNORMAL HIGH (ref 100–199)
HDL: 67 mg/dL (ref >39)
LDL Chol Calc (NIH): 129 mg/dL — ABNORMAL HIGH (ref 0–99)
Triglycerides: 110 mg/dL (ref 0–149)
VLDL Cholesterol Cal: 19 mg/dL (ref 5–40)

## 2021-06-21 LAB — HIV ANTIBODY (ROUTINE TESTING W REFLEX): HIV Screen 4th Generation wRfx: NONREACTIVE

## 2021-06-21 MED ORDER — FLUCONAZOLE 150 MG PO TABS: 150.0000 mg | ORAL_TABLET | Freq: Once | ORAL | 0 refills | Status: AC

## 2021-06-21 MED ORDER — METRONIDAZOLE 500 MG PO TABS: 500.0000 mg | ORAL_TABLET | Freq: Two times a day (BID) | ORAL | 0 refills | Status: DC

## 2021-06-21 NOTE — Telephone Encounter (Signed)
Spoke with the patient regarding positive BV and yeast.  Sent prescription for metronidazole as well as Diflucan.  She had no further questions or concerns. ?

## 2021-06-21 NOTE — Assessment & Plan Note (Signed)
Patient with history of episodes of hyperand hypoglycemia.  Hemoglobin A1c today of 6.0.  Nondiabetic.  Recommended patient discontinue blood glucose checks unless she is feeling symptomatic.  Recommend she decrease her sugar intake as well as carbohydrate intake.  We will continue to monitor. ?

## 2021-07-02 DIAGNOSIS — Z1211 Encounter for screening for malignant neoplasm of colon: Secondary | ICD-10-CM | POA: Diagnosis not present

## 2021-07-02 DIAGNOSIS — Z800 Family history of malignant neoplasm of digestive organs: Secondary | ICD-10-CM | POA: Diagnosis not present

## 2021-08-28 ENCOUNTER — Encounter: Payer: Self-pay | Admitting: Family Medicine

## 2021-08-28 ENCOUNTER — Ambulatory Visit (INDEPENDENT_AMBULATORY_CARE_PROVIDER_SITE_OTHER): Payer: BC Managed Care – PPO | Admitting: Family Medicine

## 2021-08-28 ENCOUNTER — Ambulatory Visit
Admission: RE | Admit: 2021-08-28 | Discharge: 2021-08-28 | Disposition: A | Discharge status: Home / Self Care | Payer: BC Managed Care – PPO | Source: Ambulatory Visit | Attending: Family Medicine | Admitting: Family Medicine

## 2021-08-28 VITALS — BP 126/86 | HR 77 | Ht 64.0 in | Wt 145.2 lb

## 2021-08-28 DIAGNOSIS — M545 Low back pain, unspecified: Secondary | ICD-10-CM | POA: Diagnosis not present

## 2021-08-28 MED ORDER — TIZANIDINE HCL 2 MG PO TABS: 2.0000 mg | ORAL_TABLET | Freq: Four times a day (QID) | ORAL | 3 refills | Status: DC | PRN

## 2021-08-28 NOTE — Progress Notes (Signed)
? ? ?  SUBJECTIVE:  ? ?CHIEF COMPLAINT / HPI:  ? ?Recurrent falls ?Patient presents today for recurrent falls.  She had a work injury approximately year and half to 2 years ago and has completed rehab as well as finished with her Worker's Comp. physicians.  She has had 3 falls in the last 2 weeks and reports that they occur while she is walking and has sudden muscle spasm.  When the muscle spasm happens she falls.  Denies any weakness causing the falls or bumping into anything.  Reports that her tizanidine helps with the muscle spasms but that she also takes Flexeril without any relief from that.  States that her Worker's Comp. doctor is no longer going to be seeing her so she needs these medications refilled from [name]. ?OBJECTIVE:  ? ?BP 126/86   Pulse 77   Ht '5\' 4"'$  (1.626 m)   Wt 145 lb 3.2 oz (65.9 kg)   SpO2 100%   BMI 24.92 kg/m?   ?General: Pleasant 46 year old female in no acute distress ?Cardiac: Regular rate and rhythm, no murmurs appreciated ?Respiratory: Normal work of breathing, lungs clear to auscultation ?Abdomen: Soft, nontender ?MSK: Spinous process tenderness in the lumbar region at L4 and L5.  Able to ambulate with walker ? ?ASSESSMENT/PLAN:  ? ?Lumbar spine pain ?Patient with multiple falls over the last few weeks.  Related to muscle spasms.  Denies any relation to weakness, dizziness.  Patient is seeking disability and discussed she would need to see a disability physician for this.  Physical exam concerning due to spinous process tenderness in the lumbar region.  Will obtain lumbar x-rays.  Recommended supportive care including continued use of NSAIDs, rest, ice.  Red flag symptoms discussed and strict return precautions given.  Follow-up as needed. ?  ? ? ?Gifford Shave, MD ?[place]  ? ?

## 2021-08-28 NOTE — Patient Instructions (Signed)
It was good seeing you today.  I am sorry you have been having these falls.  I want to get an x-ray of your back because you have that tenderness right over your spine.  I will call you with the results if there is any abnormalities or send you a MyChart message.  I did refill your tizanidine.  Regarding your disability you will need to see a disability doctor for this evaluation and paperwork to be filled out.  If you have any questions or concerns please call the clinic.  I hope you have a wonderful day! ?

## 2021-08-29 DIAGNOSIS — M545 Low back pain, unspecified: Secondary | ICD-10-CM | POA: Insufficient documentation

## 2021-08-29 NOTE — Assessment & Plan Note (Signed)
Patient with multiple falls over the last few weeks.  Related to muscle spasms.  Denies any relation to weakness, dizziness.  Patient is seeking disability and discussed she would need to see a disability physician for this.  Physical exam concerning due to spinous process tenderness in the lumbar region.  Will obtain lumbar x-rays.  Recommended supportive care including continued use of NSAIDs, rest, ice.  Red flag symptoms discussed and strict return precautions given.  Follow-up as needed. ?

## 2021-09-07 ENCOUNTER — Encounter: Payer: Self-pay | Admitting: Family Medicine

## 2021-09-15 NOTE — Telephone Encounter (Signed)
Called patient regarding her request for wheelchair.  She reports that she feels she needs a wheelchair so that she can use it in public because her leg spasms are occurring more frequently.  Have not lengthy discussion regarding how I do not feel a wheelchair would be appropriate and that possibly a referral back to orthopedics order to physical therapy would be more appropriate.  Patient reports that her Worker's Comp. physician has said that they will pay for no more doctors visits and so she would have to pay for out-of-pocket so she would rather just have a wheelchair.  I discussed other options and she reports she will work on following up with her orthopedist and let me know if there is anything else I can do.

## 2021-09-25 ENCOUNTER — Encounter: Payer: Self-pay | Admitting: *Deleted

## 2021-10-16 ENCOUNTER — Encounter: Payer: Self-pay | Admitting: Family Medicine

## 2021-10-16 DIAGNOSIS — Z1211 Encounter for screening for malignant neoplasm of colon: Secondary | ICD-10-CM | POA: Diagnosis not present

## 2021-10-17 NOTE — Telephone Encounter (Signed)
Spoke with patient regarding request.  She is going to call Dr. Etta Quill to ask for him to schedule this procedure.

## 2021-11-09 ENCOUNTER — Telehealth: Payer: Self-pay | Admitting: Family Medicine

## 2021-11-09 NOTE — Telephone Encounter (Signed)
Patient dropped off form at front desk for [facility] DMV placard.  Verified that patient section of form has been completed.  Last DOS/WCC with PCP was 08/28/21.  Placed form in green team folder to be completed by clinical staff.  Creig Hines

## 2021-11-09 NOTE — Telephone Encounter (Signed)
Clinical info completed on handicap form.  Placed form in Dr. Biagio Borg box for completion.    When form is completed, please route note to "RN Team" and place in wall pocket in front office.   Avon Molock, CMA

## 2021-11-11 ENCOUNTER — Other Ambulatory Visit: Payer: Self-pay

## 2021-11-11 ENCOUNTER — Emergency Department (HOSPITAL_COMMUNITY): Payer: BC Managed Care – PPO

## 2021-11-11 ENCOUNTER — Emergency Department (HOSPITAL_COMMUNITY)
Admission: EM | Admit: 2021-11-11 | Discharge: 2021-11-11 | Disposition: A | Discharge status: Home / Self Care | Payer: BC Managed Care – PPO | Attending: Emergency Medicine | Admitting: Emergency Medicine

## 2021-11-11 ENCOUNTER — Encounter (HOSPITAL_COMMUNITY): Payer: Self-pay

## 2021-11-11 DIAGNOSIS — F1290 Cannabis use, unspecified, uncomplicated: Secondary | ICD-10-CM | POA: Diagnosis not present

## 2021-11-11 DIAGNOSIS — R456 Violent behavior: Secondary | ICD-10-CM | POA: Diagnosis not present

## 2021-11-11 DIAGNOSIS — R0689 Other abnormalities of breathing: Secondary | ICD-10-CM | POA: Diagnosis not present

## 2021-11-11 DIAGNOSIS — R4182 Altered mental status, unspecified: Secondary | ICD-10-CM | POA: Insufficient documentation

## 2021-11-11 DIAGNOSIS — R410 Disorientation, unspecified: Secondary | ICD-10-CM | POA: Diagnosis not present

## 2021-11-11 DIAGNOSIS — G8929 Other chronic pain: Secondary | ICD-10-CM | POA: Diagnosis not present

## 2021-11-11 DIAGNOSIS — Y900 Blood alcohol level of less than 20 mg/100 ml: Secondary | ICD-10-CM | POA: Insufficient documentation

## 2021-11-11 DIAGNOSIS — R404 Transient alteration of awareness: Secondary | ICD-10-CM | POA: Diagnosis not present

## 2021-11-11 DIAGNOSIS — F99 Mental disorder, not otherwise specified: Secondary | ICD-10-CM | POA: Diagnosis not present

## 2021-11-11 DIAGNOSIS — R000 Tachycardia, unspecified: Secondary | ICD-10-CM | POA: Diagnosis not present

## 2021-11-11 LAB — CBC WITH DIFFERENTIAL/PLATELET
Abs Immature Granulocytes: 0.01 10*3/uL (ref 0.00–0.07)
Basophils Absolute: 0 10*3/uL (ref 0.0–0.1)
Basophils Relative: 0 %
Eosinophils Absolute: 0 10*3/uL (ref 0.0–0.5)
Eosinophils Relative: 1 %
HCT: 35 % — ABNORMAL LOW (ref 36.0–46.0)
Hemoglobin: 11.6 g/dL — ABNORMAL LOW (ref 12.0–15.0)
Immature Granulocytes: 0 %
Lymphocytes Relative: 35 %
Lymphs Abs: 1.6 10*3/uL (ref 0.7–4.0)
MCH: 29.4 pg (ref 26.0–34.0)
MCHC: 33.1 g/dL (ref 30.0–36.0)
MCV: 88.8 fL (ref 80.0–100.0)
Monocytes Absolute: 0.4 10*3/uL (ref 0.1–1.0)
Monocytes Relative: 8 %
Neutro Abs: 2.6 10*3/uL (ref 1.7–7.7)
Neutrophils Relative %: 56 %
Platelets: 209 10*3/uL (ref 150–400)
RBC: 3.94 MIL/uL (ref 3.87–5.11)
RDW: 13.3 % (ref 11.5–15.5)
WBC: 4.7 10*3/uL (ref 4.0–10.5)
nRBC: 0 % (ref 0.0–0.2)

## 2021-11-11 LAB — CBG MONITORING, ED: Glucose-Capillary: 103 mg/dL — ABNORMAL HIGH (ref 70–99)

## 2021-11-11 LAB — COMPREHENSIVE METABOLIC PANEL
ALT: 11 U/L (ref 0–44)
AST: 17 U/L (ref 15–41)
Albumin: 4.1 g/dL (ref 3.5–5.0)
Alkaline Phosphatase: 39 U/L (ref 38–126)
Anion gap: 9 (ref 5–15)
BUN: 8 mg/dL (ref 6–20)
CO2: 22 mmol/L (ref 22–32)
Calcium: 8.8 mg/dL — ABNORMAL LOW (ref 8.9–10.3)
Chloride: 106 mmol/L (ref 98–111)
Creatinine, Ser: 0.81 mg/dL (ref 0.44–1.00)
GFR, Estimated: >60 mL/min (ref >60)
Glucose, Bld: 115 mg/dL — ABNORMAL HIGH (ref 70–99)
Potassium: 2.8 mmol/L — ABNORMAL LOW (ref 3.5–5.1)
Sodium: 137 mmol/L (ref 135–145)
Total Bilirubin: 0.3 mg/dL (ref 0.3–1.2)
Total Protein: 6.9 g/dL (ref 6.5–8.1)

## 2021-11-11 LAB — RAPID URINE DRUG SCREEN, HOSP PERFORMED
Amphetamines: NOT DETECTED
Barbiturates: NOT DETECTED
Benzodiazepines: POSITIVE — AB
Cocaine: NOT DETECTED
Opiates: NOT DETECTED
Tetrahydrocannabinol: POSITIVE — AB

## 2021-11-11 LAB — I-STAT BETA HCG BLOOD, ED (MC, WL, AP ONLY): I-stat hCG, quantitative: <5 m[IU]/mL (ref <5)

## 2021-11-11 LAB — ETHANOL: Alcohol, Ethyl (B): <10 mg/dL (ref <10)

## 2021-11-11 MED ORDER — ONDANSETRON 4 MG PO TBDP
4.0000 mg | ORAL_TABLET | Freq: Once | ORAL | Status: AC
  Administered 2021-11-11: 4 mg via ORAL
  Filled 2021-11-11: qty 1

## 2021-11-11 MED ORDER — SODIUM CHLORIDE 0.9 % IV BOLUS
1000.0000 mL | Freq: Once | INTRAVENOUS | Status: AC
Start: 2021-11-11 — End: 2021-11-11
  Administered 2021-11-11: 1000 mL via INTRAVENOUS

## 2021-11-11 MED ORDER — POTASSIUM CHLORIDE CRYS ER 20 MEQ PO TBCR
40.0000 meq | EXTENDED_RELEASE_TABLET | Freq: Once | ORAL | Status: AC
Start: 2021-11-11 — End: 2021-11-11
  Administered 2021-11-11: 40 meq via ORAL
  Filled 2021-11-11: qty 2

## 2021-11-11 NOTE — ED Provider Notes (Signed)
[facility] EMERGENCY DEPARTMENT Provider Note   CSN: [id number] Arrival date & time: 11/11/21  0130     History  Chief Complaint  Patient presents with   Ingestion    Bethany [name] is a 46 y.o. female.  Patient as above with significant medical history as below, including anxiety, depression who presents to the ED with complaint of THC ingestion. Per EMS family reported pt ingested a large amount of THC gummies and began hallucinating and acting abnormally. She initially refused EMS transport and was combative/agitated and given '5mg'$  versed by EMS. Pt sedated on arrival and unable to contribute to history.      Past Medical History:  Diagnosis Date   Anemia    Anxiety    COVID-19    Depression    Fibrocystic disease of both breasts    History of cervical dysplasia    laser surgery   Insomnia    Low blood sugar 2001   pt checks BS in AM, controls with diet   Vaginal Pap smear, abnormal     Past Surgical History:  Procedure Laterality Date   BREAST FIBROADENOMA SURGERY  2011, 2008   benign    BREAST RECONSTRUCTION WITH PLACEMENT OF TISSUE EXPANDER AND FLEX HD (ACELLULAR HYDRATED DERMIS) Bilateral 10/19/2018   Procedure: BREAST RECONSTRUCTION WITH PLACEMENT OF TISSUE EXPANDER AND FLEX HD (ACELLULAR HYDRATED DERMIS);  Surgeon: Wallace Going, DO;  Location: [facility];  Service: Plastics;  Laterality: Bilateral;   [place] N/A 01/02/2016   Procedure: NOVASURE;  Surgeon: Woodroe Mode, MD;  Location: [facility];  Service: Gynecology;  Laterality: N/A;   NIPPLE SPARING MASTECTOMY Bilateral 10/19/2018   Procedure: BILATERAL NIPPLE SPARING MASTECTOMY;  Surgeon: Jovita Kussmaul, MD;  Location: [facility];  Service: General;  Laterality: Bilateral;   REMOVAL OF BILATERAL TISSUE EXPANDERS WITH PLACEMENT OF BILATERAL BREAST IMPLANTS Bilateral 12/30/2018   Procedure: REMOVAL OF BILATERAL TISSUE EXPANDERS WITH PLACEMENT OF BILATERAL  BREAST IMPLANTS;  Surgeon: Wallace Going, DO;  Location: [facility];  Service: Plastics;  Laterality: Bilateral;   TUBAL LIGATION  2003     The history is provided by the patient and the EMS personnel. No language interpreter was used.  Ingestion       Home Medications Prior to Admission medications   Medication Sig Start Date End Date Taking? Authorizing Provider  ARIPiprazole (ABILIFY) 30 MG tablet Take 1 tablet (30 mg total) by mouth daily. 07/18/20 07/18/21  Arfeen, Arlyce Harman, MD  gabapentin (NEURONTIN) 100 MG capsule Take 2 capsules (200 mg total) by mouth 3 (three) times daily. 06/19/20   Concepcion Living, MD  hydrOXYzine (VISTARIL) 25 MG capsule Take 1 capsule (25 mg total) by mouth 3 (three) times daily. 07/18/20   Arfeen, Arlyce Harman, MD  metroNIDAZOLE (FLAGYL) 500 MG tablet Take 1 tablet (500 mg total) by mouth 2 (two) times daily. 06/21/21   Cresenzo, Angelyn Punt, MD  pioglitazone (ACTOS) 15 MG tablet Take 1 tablet (15 mg total) by mouth daily. 10/13/20   Renato Shin, MD  tiZANidine (ZANAFLEX) 2 MG tablet Take 1-2 tablets (2-4 mg total) by mouth every 6 (six) hours as needed. 08/28/21   Concepcion Living, MD      Allergies    Lamictal [lamotrigine], Codeine, Oxycodone hcl, and Penicillins    Review of Systems   Review of Systems  Unable to perform ROS: Mental status change    Physical Exam  Updated Vital Signs BP 108/74   Pulse 89   Temp 98.2 F (36.8 C) (Oral)   Resp 15   Ht '5\' 4"'$  (1.626 m)   Wt 65.9 kg   SpO2 99%   BMI 24.94 kg/m  Physical Exam Vitals and nursing note reviewed.  Constitutional:      General: She is not in acute distress.    Appearance: Normal appearance.  HENT:     Head: Normocephalic and atraumatic.     Right Ear: External ear normal.     Left Ear: External ear normal.     Nose: Nose normal.     Mouth/Throat:     Mouth: Mucous membranes are moist.  Eyes:     General: No scleral icterus.       Right eye: No discharge.        Left  eye: No discharge.     Extraocular Movements: Extraocular movements intact.     Pupils: Pupils are equal, round, and reactive to light.     Comments: Pupils dilated b/l, PERRL  Cardiovascular:     Rate and Rhythm: Normal rate and regular rhythm.     Pulses: Normal pulses.     Heart sounds: Normal heart sounds.  Pulmonary:     Effort: Pulmonary effort is normal. No respiratory distress.     Breath sounds: Normal breath sounds.  Abdominal:     General: Abdomen is flat.     Palpations: Abdomen is soft.     Tenderness: There is no abdominal tenderness.  Musculoskeletal:        General: Normal range of motion.     Cervical back: Normal range of motion.     Right lower leg: No edema.     Left lower leg: No edema.  Skin:    General: Skin is warm and dry.     Capillary Refill: Capillary refill takes less than 2 seconds.  Neurological:     Mental Status: She is lethargic.     GCS: GCS eye subscore is 4. GCS verbal subscore is 4. GCS motor subscore is 6.     Motor: No tremor or seizure activity.     Comments: Moving extremities spontaneously, arms/legs will cross midline No clonus  Patient somnolent but will arouse to noxious stimulus, intermittently following commands  Psychiatric:        Mood and Affect: Mood normal.        Behavior: Behavior normal.     ED Results / Procedures / Treatments   Labs (all labs ordered are listed, but only abnormal results are displayed) Labs Reviewed  COMPREHENSIVE METABOLIC PANEL - Abnormal; Notable for the following components:      Result Value   Potassium 2.8 (*)    Glucose, Bld 115 (*)    Calcium 8.8 (*)    All other components within normal limits  RAPID URINE DRUG SCREEN, HOSP PERFORMED - Abnormal; Notable for the following components:   Benzodiazepines POSITIVE (*)    Tetrahydrocannabinol POSITIVE (*)    All other components within normal limits  CBC WITH DIFFERENTIAL/PLATELET - Abnormal; Notable for the following components:    Hemoglobin 11.6 (*)    HCT 35.0 (*)    All other components within normal limits  CBG MONITORING, ED - Abnormal; Notable for the following components:   Glucose-Capillary 103 (*)    All other components within normal limits  ETHANOL  I-STAT BETA HCG BLOOD, ED ([facility], [facility], [facility] ONLY)    EKG EKG Interpretation  Date/Time:  Sunday November 11 2021 01:39:39 EDT Ventricular Rate:  107 PR Interval:  145 QRS Duration: 81 QT Interval:  323 QTC Calculation: 431 R Axis:   19 Text Interpretation: Sinus tachycardia Probable left atrial enlargement Similar to prior tracing 1/21 Confirmed by Wynona Dove ([id number]) on 11/11/2021 3:28:09 AM  Radiology CT Head Wo Contrast  Result Date: 11/11/2021 CLINICAL DATA:  Delirium EXAM: CT HEAD WITHOUT CONTRAST TECHNIQUE: Contiguous axial images were obtained from the base of the skull through the vertex without intravenous contrast. RADIATION DOSE REDUCTION: This exam was performed according to the departmental dose-optimization program which includes automated exposure control, adjustment of the mA and/or kV according to patient size and/or use of iterative reconstruction technique. COMPARISON:  10/31/2019 FINDINGS: Brain: No evidence of acute infarction, hemorrhage, hydrocephalus, extra-axial collection or mass lesion/mass effect. Vascular: No hyperdense vessel or unexpected calcification. Skull: Normal. Negative for fracture or focal lesion. Sinuses/Orbits: The visualized paranasal sinuses are essentially clear. The mastoid air cells are unopacified. Other: None. IMPRESSION: Normal head CT. Electronically Signed   By: Julian Hy M.D.   On: 11/11/2021 02:17    Procedures Procedures    Medications Ordered in ED Medications  sodium chloride 0.9 % bolus 1,000 mL (0 mLs Intravenous Stopped 11/11/21 0338)  potassium chloride SA (KLOR-CON M) CR tablet 40 mEq (40 mEq Oral Given 11/11/21 0341)  ondansetron (ZOFRAN-ODT) disintegrating tablet 4 mg (4 mg Oral Given 11/11/21  0401)    ED Course/ Medical Decision Making/ A&P Clinical Course as of 11/11/21 0630  Sun Nov 11, 2021  0353 Mental status improved, she denies SI or HI.  Reports recreational THC use. [SG]    Clinical Course User Index [SG] Jeanell Sparrow, DO                           Medical Decision Making Amount and/or Complexity of Data Reviewed Labs: ordered. Radiology: ordered.  Risk Prescription drug management.    CC: Ingestion, AMS, agitation  This patient presents to the Emergency Department for the above complaint. This involves an extensive number of treatment options and is a complaint that carries with it a high risk of complications and morbidity. Vital signs were reviewed. Serious etiologies considered.  Differential diagnoses for altered mental status includes but is not exclusive to alcohol, illicit or prescription medications, intracranial pathology such as stroke, intracerebral hemorrhage, fever or infectious causes including sepsis, hypoxemia, uremia, trauma, endocrine related disorders such as diabetes, hypoglycemia, thyroid-related diseases, etc.  Record review:  Previous records obtained and reviewed prior ED visits, prior labs and imaging, home medications  Additional history obtained from EMS  Medical and surgical history as noted above.   Work up as above, notable for:  Labs & imaging results that were available during my care of the patient were visualized by me and considered in my medical decision making.  Physical exam as above.   I ordered imaging studies which included CT head. I visualized the imaging, interpreted images, and I agree with radiologist interpretation.  No acute process  Cardiac monitoring reviewed and interpreted personally which shows NSR  Reviewed, potassium low, will replace.  Labs otherwise stable >> UDS positive for benzos and THC, patient was given Versed by EMS  Management: Oral K+ P.o. challenge  ED Course: Clinical Course  as of 11/11/21 0630  Sun Nov 11, 2021  0353 Mental status improved, she denies SI or HI.  Reports recreational THC use. [SG]  Clinical Course User Index [SG] Jeanell Sparrow, DO     Reassessment:  Patient more awake, tolerating p.o. intake with difficulty.  Reports recreational THC use.  No SI.  Not intentional overdose.  6:30 AM Patient reassessed, back to baseline.  She is ambulatory steady gait.  Tolerant p.o. intake without difficulty.  No nausea or vomiting.  No SI or HI.  Presentation today likely secondary to adverse effect from [facility] THC intoxication.  Patient reports she uses THC to help with her chronic pain.  Advised patient to use a less potent dose of THC if she is adamant that she must continues in [facility].  Recommend THC cessation.  Patient given outpatient follow-up resources regarding rehab.  Substance abuse resources.  Will discharge with family.    Admission was considered.    The patient improved significantly and was discharged in stable condition. Detailed discussions were had with the patient regarding current findings, and need for close f/u with PCP or on call doctor. The patient has been instructed to return immediately if the symptoms worsen in any way for re-evaluation. Patient verbalized understanding and is in agreement with current care plan. All questions answered prior to discharge.              Social determinants of health include -  Social History   Socioeconomic History   Marital status: Married    Spouse name: Not on file   Number of children: Not on file   Years of education: Not on file   Highest education level: Not on file  Occupational History   Not on file  Tobacco Use   Smoking status: Never   Smokeless tobacco: Never  Vaping Use   Vaping Use: Never used  Substance and Sexual Activity   Alcohol use: No    Alcohol/week: 0.0 standard drinks of alcohol   Drug use: No   Sexual activity: Yes    Partners: Male    Birth  control/protection: Surgical  Other Topics Concern   Not on file  Social History Narrative   Married, 3 daughters.  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         Social Determinants of Health   Financial Resource Strain: Not on file  Food Insecurity: Not on file  Transportation Needs: Not on file  Physical Activity: Not on file  Stress: Not on file  Social Connections: Not on file  Intimate Partner Violence: Not on file      This chart was dictated using voice recognition software.  Despite best efforts to proofread,  errors can occur which can change the documentation meaning.         Final Clinical Impression(s) / ED Diagnoses Final diagnoses:  Marijuana use  Altered mental status, unspecified altered mental status type    Rx / DC Orders ED Discharge Orders     None         Jeanell Sparrow, DO 11/11/21 0630

## 2021-11-11 NOTE — Discharge Instructions (Addendum)
It was a pleasure caring for you today in the emergency department.  Please return to the emergency department for any worsening or worrisome symptoms.  Please refrain from [facility] use in the future

## 2021-11-11 NOTE — ED Triage Notes (Signed)
Pt ingested thc gummies tonight and started hallucinating. Family called EMS. When EMS arrived pt was combative and would not leave with them. Pt received 5 versed. Upon arrival pt awake to voice but is not answering questions.

## 2021-11-11 NOTE — ED Notes (Signed)
Patient drank some water and lemon lime, tolerated well.

## 2021-11-11 NOTE — ED Notes (Signed)
Patient ambulated to bathroom and back, patient required one assist, a little unsteady on feet, but able to ambulate. Patient back in bed, NAD noted. Call light in reach. Urine sent to lab.

## 2021-11-11 NOTE — ED Notes (Signed)
Patient is more alert and oriented to person, place, and time. Patient can recall some details, saying she remembers taking 1 gummy because she thought it would help with her back pain. Patient states she is still unable to get a urine right now, states she doesn't feel like she needs to pee.

## 2021-11-12 ENCOUNTER — Ambulatory Visit (INDEPENDENT_AMBULATORY_CARE_PROVIDER_SITE_OTHER): Payer: BC Managed Care – PPO | Admitting: Family Medicine

## 2021-11-12 ENCOUNTER — Encounter: Payer: Self-pay | Admitting: Family Medicine

## 2021-11-12 VITALS — BP 112/70 | HR 74 | Ht 64.0 in | Wt 135.0 lb

## 2021-11-12 DIAGNOSIS — F1290 Cannabis use, unspecified, uncomplicated: Secondary | ICD-10-CM | POA: Diagnosis not present

## 2021-11-12 NOTE — Patient Instructions (Signed)
I would eat and drink normally, no alcohol, no illicits including I would avoid food supplements and gummies I think you will resolve over the next few days.

## 2021-11-12 NOTE — Progress Notes (Signed)
    CHIEF COMPLAINT / HPI: Feels drowsy, sleepy, fatigued and with lack of focus.  Started yesterday after she took a gummy for back pain.  She was seen in the emergency department.  She has a lot of questions about why she still feeling drowsy.  She is here with her mom.   PERTINENT  PMH / PSH: I have reviewed the patient's medications, allergies, past medical and surgical history, smoking status and updated in the EMR as appropriate. She reports not taking any of her current [facility] scheduled medications except for the tizanidine which she is taking for her back pain.  OBJECTIVE:  BP 112/70   Pulse 74   Ht '5\' 4"'$  (1.626 m)   Wt 135 lb (61.2 kg)   SpO2 98%   BMI 23.17 kg/m  GENERAL: Well-developed female no acute distress. PSYCH: AxOx4. Good eye contact.. No psychomotor retardation or agitation. Appropriate speech fluency and content. Asks and answers questions appropriately. Mood is congruent.   ASSESSMENT / PLAN:  Marijuana use :per patient report it was in a gummy.  She is concerned there may have been other things in this gummy.  Reviewed her lab work with her.  She was under the impression that her urinalysis showed cocaine but did not.  I recommended she not use any more Gummies.  I think this will resolve in the next 1 to 2 days.  I would not recommend that she drive until she feels back to normal. No problem-specific Assessment & Plan notes found for this encounter.   Dorcas Mcmurray MD

## 2021-11-13 NOTE — Telephone Encounter (Signed)
Reviewed, completed, and signed form.  Note routed to RN team inbasket and placed completed form in RN Wall pocket in the front office.  [name], MD

## 2021-11-15 NOTE — Telephone Encounter (Signed)
Patient called and informed that forms are ready for pick up. Copy made and placed in batch scanning. Original placed at front desk for pick up.   Arlisha Patalano C Waleed Dettman, RN  

## 2021-12-03 ENCOUNTER — Ambulatory Visit (INDEPENDENT_AMBULATORY_CARE_PROVIDER_SITE_OTHER): Payer: BC Managed Care – PPO | Admitting: Family Medicine

## 2021-12-03 ENCOUNTER — Encounter: Payer: Self-pay | Admitting: Family Medicine

## 2021-12-03 VITALS — BP 110/68 | HR 62 | Ht 64.0 in | Wt 135.0 lb

## 2021-12-03 DIAGNOSIS — E876 Hypokalemia: Secondary | ICD-10-CM

## 2021-12-03 DIAGNOSIS — Z0000 Encounter for general adult medical examination without abnormal findings: Secondary | ICD-10-CM

## 2021-12-03 DIAGNOSIS — R7309 Other abnormal glucose: Secondary | ICD-10-CM

## 2021-12-03 DIAGNOSIS — M545 Low back pain, unspecified: Secondary | ICD-10-CM

## 2021-12-03 HISTORY — DX: Hypokalemia: E87.6

## 2021-12-03 NOTE — Assessment & Plan Note (Addendum)
Sees [facility] in the past for RFA with relief. Will follow up with office to assess getting her back in for a repeat RFA. Continue tizanidine. Recommended tylenol 500 mg Q6 prn for pain as well. Will evaluate for transport chair at next visit given time constraints.

## 2021-12-03 NOTE — Assessment & Plan Note (Signed)
K during ED visit for THC use in 10/2021 2.8. Oral K given at that time. Will recheck CMP today to monitor.

## 2021-12-03 NOTE — Patient Instructions (Signed)
It was great to see you today!  I will check in with [facility] to see how we can get you back in with them. We will check your potassium and A1c today. I will prescribe linzess for your stools. I will see you back in 1-2 weeks to assess if your breast pain is still ongoing and also to discuss a prescription for a transport chair.  Let me know what other questions you have!  [name], MD [place]
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
## 2021-12-03 NOTE — Assessment & Plan Note (Addendum)
A1c 6 months ago 6%. Patient has improved diet and lost weight. Recheck ordered but not collected before patient left. Will recheck at next visit.

## 2021-12-04 ENCOUNTER — Telehealth: Payer: Self-pay | Admitting: Family Medicine

## 2021-12-04 LAB — COMPREHENSIVE METABOLIC PANEL
ALT: 15 IU/L (ref 0–32)
AST: 18 IU/L (ref 0–40)
Albumin/Globulin Ratio: 1.7 (ref 1.2–2.2)
Albumin: 5 g/dL — ABNORMAL HIGH (ref 3.9–4.9)
Alkaline Phosphatase: 56 IU/L (ref 44–121)
BUN/Creatinine Ratio: 9 (ref 9–23)
BUN: 7 mg/dL (ref 6–24)
Bilirubin Total: 0.2 mg/dL (ref 0.0–1.2)
CO2: 22 mmol/L (ref 20–29)
Calcium: 10.1 mg/dL (ref 8.7–10.2)
Chloride: 101 mmol/L (ref 96–106)
Creatinine, Ser: 0.81 mg/dL (ref 0.57–1.00)
Globulin, Total: 2.9 g/dL (ref 1.5–4.5)
Glucose: 91 mg/dL (ref 70–99)
Potassium: 4.3 mmol/L (ref 3.5–5.2)
Sodium: 141 mmol/L (ref 134–144)
Total Protein: 7.9 g/dL (ref 6.0–8.5)
eGFR: 90 mL/min/{1.73_m2} (ref >59)

## 2021-12-04 NOTE — Telephone Encounter (Signed)
Patient brought in disability placard form into clinic visit without her portion completed. I have completed my portion and placed in triage box. RN team notified, appreciate assistance.  [name], MD

## 2021-12-05 NOTE — Telephone Encounter (Signed)
Called patient. Completed section including name, address and phone number. Patient informed that when she picks up form she will need to add her driver's license number and sign the paperwork.   Copy made and placed in batch scanning. Original at desk for pick up.   Talbot Grumbling, RN

## 2021-12-13 ENCOUNTER — Ambulatory Visit (INDEPENDENT_AMBULATORY_CARE_PROVIDER_SITE_OTHER): Payer: BC Managed Care – PPO | Admitting: Family Medicine

## 2021-12-13 ENCOUNTER — Other Ambulatory Visit: Payer: Self-pay | Admitting: Family Medicine

## 2021-12-13 ENCOUNTER — Encounter: Payer: Self-pay | Admitting: Family Medicine

## 2021-12-13 VITALS — BP 115/70 | HR 69 | Temp 98.8°F | Ht 64.0 in | Wt 132.2 lb

## 2021-12-13 DIAGNOSIS — R269 Unspecified abnormalities of gait and mobility: Secondary | ICD-10-CM

## 2021-12-13 DIAGNOSIS — Z0000 Encounter for general adult medical examination without abnormal findings: Secondary | ICD-10-CM

## 2021-12-13 DIAGNOSIS — R7309 Other abnormal glucose: Secondary | ICD-10-CM

## 2021-12-13 DIAGNOSIS — N644 Mastodynia: Secondary | ICD-10-CM | POA: Diagnosis not present

## 2021-12-13 DIAGNOSIS — K5900 Constipation, unspecified: Secondary | ICD-10-CM

## 2021-12-13 DIAGNOSIS — M545 Low back pain, unspecified: Secondary | ICD-10-CM | POA: Diagnosis not present

## 2021-12-13 LAB — POCT GLYCOSYLATED HEMOGLOBIN (HGB A1C): Hemoglobin A1C: 5.6 % (ref 4.0–5.6)

## 2021-12-13 MED ORDER — LINACLOTIDE 145 MCG PO CAPS: 145.0000 ug | ORAL_CAPSULE | Freq: Every day | ORAL | Status: DC
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
## 2021-12-13 NOTE — Addendum Note (Signed)
Addended by: [name] on: 12/13/2021 01:58 PM   Modules accepted: Orders

## 2021-12-13 NOTE — Assessment & Plan Note (Signed)
Chronic. Sent message to patient on MyChart explaining need to call [facility] to reschedule an appointment, as a referral is not needed d/t being seen in January 2023. Will also refer to PT for assessment of walker and completion of DME.
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
## 2021-12-13 NOTE — Assessment & Plan Note (Signed)
Chronic. Been given Linzess before with improvement. Refill sent.
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
## 2021-12-13 NOTE — Assessment & Plan Note (Signed)
A1c is 5.6% today, down from 6% in March 2023. Continue lifestyle modifications.

## 2021-12-13 NOTE — Patient Instructions (Signed)
It was great to see you today! Here's what we talked about:  I will look into the [facility] referral. I will place a new referral to physical therapy so they can help you get a wheelchair. I will prescribe Linzess for your bowels. Please also have your GI office send me the colonoscopy records. I have placed an order for a breast ultrasound so we can further evaluate your breast pain. I do not believe this is an infection at this time.  Please let me know if you have any other questions.  Dr. Marcha Dutton

## 2021-12-13 NOTE — Assessment & Plan Note (Signed)
Colonoscopy completed per patient June 2023. Will await records.

## 2021-12-13 NOTE — Assessment & Plan Note (Signed)
" >>  ASSESSMENT AND PLAN FOR BREAST DISEASE WRITTEN ON 09/08/2023  1:03 PM BY Sofiah Lyne, MD   >>ASSESSMENT AND PLAN FOR BREAST PAIN, RIGHT WRITTEN ON 12/13/2021 11:29 AM BY Kiko Ripp, MD  Chronic right breast pain with wide differential. Time course, in addition to trauma history and placement of implants, places scar tissue at the top of the differential. Also considered infection, though chronicity and lack of fever, erythema, or warmth would be less likely. Could also be component of muscular strain or damage. Will order breast ultrasound to evaluate further and follow up results. "

## 2021-12-14 ENCOUNTER — Telehealth: Payer: Self-pay

## 2021-12-14 ENCOUNTER — Other Ambulatory Visit: Payer: Self-pay | Admitting: Family Medicine

## 2021-12-14 MED ORDER — LINACLOTIDE 145 MCG PO CAPS: 145.0000 ug | ORAL_CAPSULE | Freq: Every day | ORAL | 0 refills | Status: DC

## 2021-12-14 NOTE — Telephone Encounter (Signed)
Called patient with upcoming appointment for  Diagnostic mammogram and ultrasound if needed.  Bethany [name], [place]

## 2021-12-14 NOTE — Addendum Note (Signed)
Addended by: [name] on: 12/14/2021 04:05 PM   Modules accepted: Orders

## 2021-12-20 ENCOUNTER — Ambulatory Visit
Admission: RE | Admit: 2021-12-20 | Discharge: 2021-12-20 | Disposition: A | Discharge status: Home / Self Care | Payer: BC Managed Care – PPO | Source: Ambulatory Visit | Attending: Family Medicine | Admitting: Family Medicine

## 2021-12-20 DIAGNOSIS — N644 Mastodynia: Secondary | ICD-10-CM

## 2021-12-31 ENCOUNTER — Ambulatory Visit: Payer: BC Managed Care – PPO

## 2022-01-11 ENCOUNTER — Encounter: Payer: Self-pay | Admitting: Family Medicine

## 2022-01-11 ENCOUNTER — Ambulatory Visit (INDEPENDENT_AMBULATORY_CARE_PROVIDER_SITE_OTHER): Payer: BC Managed Care – PPO | Admitting: Family Medicine

## 2022-01-11 ENCOUNTER — Other Ambulatory Visit (HOSPITAL_COMMUNITY)
Admission: RE | Admit: 2022-01-11 | Discharge: 2022-01-11 | Disposition: A | Discharge status: Home / Self Care | Payer: BC Managed Care – PPO | Source: Ambulatory Visit | Attending: Family Medicine | Admitting: Family Medicine

## 2022-01-11 VITALS — BP 118/84 | HR 72 | Ht 64.0 in | Wt 132.0 lb

## 2022-01-11 DIAGNOSIS — Z113 Encounter for screening for infections with a predominantly sexual mode of transmission: Secondary | ICD-10-CM

## 2022-01-11 DIAGNOSIS — Z23 Encounter for immunization: Secondary | ICD-10-CM | POA: Diagnosis not present

## 2022-01-11 DIAGNOSIS — F419 Anxiety disorder, unspecified: Secondary | ICD-10-CM | POA: Diagnosis not present

## 2022-01-11 DIAGNOSIS — G4700 Insomnia, unspecified: Secondary | ICD-10-CM

## 2022-01-11 DIAGNOSIS — B3731 Acute candidiasis of vulva and vagina: Secondary | ICD-10-CM | POA: Diagnosis not present

## 2022-01-11 DIAGNOSIS — T7491XA Unspecified adult maltreatment, confirmed, initial encounter: Secondary | ICD-10-CM

## 2022-01-11 MED ORDER — HYDROXYZINE HCL 50 MG PO TABS: 50.0000 mg | ORAL_TABLET | Freq: Every evening | ORAL | 0 refills | Status: DC | PRN

## 2022-01-11 NOTE — Patient Instructions (Signed)
It was wonderful to meet you today. Thank you for allowing me to be a part of your care. Below is a short summary of what we discussed at your visit today:  STI screening Today we obtained blood work to test for HIV and syphilis.  We also obtained a self vaginal swab for gonorrhea, chlamydia, and trichomonas. If the results are normal, I will send you a letter or MyChart message. If the results are abnormal, I will give you a call.    Anxiety, sleep Start the Atarax as prescribed: Take 1 tablet (50 mg) daily at bedtime. If you need less, break it in half and take 1/2 tablet (25 mg) It is safe to take up to 2 tablets (100 mg) at a time. Do not take more than 3 times daily.  This is meant to be an as-needed medicine.  Vaccines Today you received the annual flu vaccine. You may experience some residual soreness at the injection site.  Gentle stretches and regular use of that arm will help speed up your recovery.  As the vaccines are giving your immune system a "practice run" against specific infections, you may feel a little under the weather for the next several days.  We recommend rest as needed and hydrating.   If you have any questions or concerns, please do not hesitate to contact us via phone or MyChart message.   Ezequiel Essex, MD

## 2022-01-12 ENCOUNTER — Encounter: Payer: Self-pay | Admitting: Family Medicine

## 2022-01-12 DIAGNOSIS — Z23 Encounter for immunization: Secondary | ICD-10-CM | POA: Insufficient documentation

## 2022-01-12 DIAGNOSIS — Z113 Encounter for screening for infections with a predominantly sexual mode of transmission: Secondary | ICD-10-CM | POA: Insufficient documentation

## 2022-01-12 DIAGNOSIS — F419 Anxiety disorder, unspecified: Secondary | ICD-10-CM | POA: Insufficient documentation

## 2022-01-12 DIAGNOSIS — T7491XA Unspecified adult maltreatment, confirmed, initial encounter: Secondary | ICD-10-CM | POA: Insufficient documentation

## 2022-01-12 HISTORY — DX: Encounter for screening for infections with a predominantly sexual mode of transmission: Z11.3

## 2022-01-12 HISTORY — DX: Encounter for immunization: Z23

## 2022-01-12 LAB — RPR: RPR Ser Ql: NONREACTIVE

## 2022-01-12 LAB — HIV ANTIBODY (ROUTINE TESTING W REFLEX): HIV Screen 4th Generation wRfx: NONREACTIVE

## 2022-01-12 NOTE — Assessment & Plan Note (Signed)
>>  ASSESSMENT AND PLAN FOR ANXIETY WRITTEN ON 01/12/2022  3:33 PM BY Ezequiel Essex, MD  Start hydroxyzine 50 mg up to TID PRN. Discussed use 50-100 mg QHS for help with sleep and possibly trying 25 mg during the day PRN before stressful episodes. Patient amenable.

## 2022-01-12 NOTE — Assessment & Plan Note (Signed)
Received flu vaccine today. Tolerated well without adverse side effects.

## 2022-01-12 NOTE — Assessment & Plan Note (Signed)
Start hydroxyzine 50 mg up to TID PRN. Discussed use 50-100 mg QHS for help with sleep and possibly trying 25 mg during the day PRN before stressful episodes. Patient amenable.
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
## 2022-01-12 NOTE — Assessment & Plan Note (Signed)
Currently working with [facility] in [place]. Establishes soon with counselor in early October. Politely declines CCM referral at this time, feels well supported through [facility].

## 2022-01-12 NOTE — Assessment & Plan Note (Signed)
Today obtained blood work for HIV and RPR, self-swab vaginal sample for GC, CH, and trich. Will follow results.

## 2022-01-13 LAB — CERVICOVAGINAL ANCILLARY ONLY
Bacterial Vaginitis (gardnerella): NEGATIVE
Candida Glabrata: NEGATIVE
Candida Vaginitis: POSITIVE — AB
Chlamydia: NEGATIVE
Comment: NEGATIVE
Comment: NEGATIVE
Comment: NEGATIVE
Comment: NEGATIVE
Comment: NEGATIVE
Comment: NORMAL
Neisseria Gonorrhea: NEGATIVE
Trichomonas: NEGATIVE

## 2022-01-15 ENCOUNTER — Other Ambulatory Visit: Payer: Self-pay | Admitting: Family Medicine

## 2022-01-15 MED ORDER — FLUCONAZOLE 150 MG PO TABS: 150.0000 mg | ORAL_TABLET | Freq: Once | ORAL | 0 refills | Status: AC
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
## 2022-01-16 ENCOUNTER — Telehealth: Payer: Self-pay | Admitting: Family Medicine

## 2022-01-16 ENCOUNTER — Ambulatory Visit: Payer: BC Managed Care – PPO | Attending: Family Medicine

## 2022-01-16 DIAGNOSIS — M6281 Muscle weakness (generalized): Secondary | ICD-10-CM | POA: Insufficient documentation

## 2022-01-16 DIAGNOSIS — R2689 Other abnormalities of gait and mobility: Secondary | ICD-10-CM | POA: Diagnosis not present

## 2022-01-16 DIAGNOSIS — M545 Low back pain, unspecified: Secondary | ICD-10-CM | POA: Diagnosis not present

## 2022-01-16 DIAGNOSIS — M5459 Other low back pain: Secondary | ICD-10-CM | POA: Insufficient documentation

## 2022-01-16 NOTE — Telephone Encounter (Signed)
Patient walked in to request a prescription for a "Walker" per the physical therapist Carollee Leitz  Copy of type place in doctors box.    Outpatient Rehabilitation  [facility]. Ph # [phone]

## 2022-01-17 ENCOUNTER — Encounter: Payer: Self-pay | Admitting: Family Medicine

## 2022-01-17 NOTE — Telephone Encounter (Signed)
Community message sent to [employer]. Will await response.   Havish Petties C Cindee Mclester, RN  

## 2022-01-17 NOTE — Telephone Encounter (Signed)
DME order placed for 4 wheeled rolling walker with seat. Appreciate PT assistance.

## 2022-01-18 DIAGNOSIS — M545 Low back pain, unspecified: Secondary | ICD-10-CM | POA: Diagnosis not present

## 2022-01-29 ENCOUNTER — Other Ambulatory Visit: Payer: Self-pay | Admitting: Family Medicine

## 2022-01-29 DIAGNOSIS — F419 Anxiety disorder, unspecified: Secondary | ICD-10-CM

## 2022-01-29 DIAGNOSIS — G4700 Insomnia, unspecified: Secondary | ICD-10-CM

## 2022-02-01 ENCOUNTER — Other Ambulatory Visit: Payer: Self-pay | Admitting: Family Medicine

## 2022-02-01 DIAGNOSIS — F419 Anxiety disorder, unspecified: Secondary | ICD-10-CM

## 2022-02-01 DIAGNOSIS — G4700 Insomnia, unspecified: Secondary | ICD-10-CM

## 2022-02-06 ENCOUNTER — Ambulatory Visit (INDEPENDENT_AMBULATORY_CARE_PROVIDER_SITE_OTHER): Payer: BC Managed Care – PPO | Admitting: Family Medicine

## 2022-02-06 VITALS — BP 115/80 | HR 99 | Temp 98.5°F | Ht 64.0 in | Wt 138.2 lb

## 2022-02-06 DIAGNOSIS — M545 Low back pain, unspecified: Secondary | ICD-10-CM

## 2022-02-06 MED ORDER — KETOROLAC TROMETHAMINE 30 MG/ML IJ SOLN
30.0000 mg | Freq: Once | INTRAMUSCULAR | Status: AC
  Administered 2022-02-06: 30 mg via INTRAMUSCULAR

## 2022-02-06 MED ORDER — DICLOFENAC SODIUM 1 % EX GEL: 4.0000 g | Freq: Four times a day (QID) | CUTANEOUS | 1 refills | Status: DC

## 2022-02-06 NOTE — Patient Instructions (Signed)
It was nice to meet you today!  We are giving you a dose of toradol today to help with pain  Also trying a topical antiinflammatory medication called Voltaren gel.  You can apply this up to 4 times a day. Do not take your Tylenol more than 3 times a day.  I hope you feel better very soon.  Be well, Dr. Ardelia Mems

## 2022-02-06 NOTE — Assessment & Plan Note (Signed)
Worsening chronic pain.  No red flags concerning for cauda equina syndrome.  Has upcoming appointment to discuss RFA with orthopedics.  As a temporizing measure will give Toradol 30 mg IM today.  She has tolerated this well in the past with good relief of symptoms.  We will also prescribe Voltaren gel to use topically.  I am avoiding doing oral NSAIDs due to a history of ibuprofen-induced leukopenia (see hematology notes from 2014).  She will follow-up if not improving with this regimen.  Otherwise follow-up with Ortho for RFA discussion.
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
## 2022-03-03 ENCOUNTER — Encounter: Payer: Self-pay | Admitting: Family Medicine

## 2022-03-04 ENCOUNTER — Other Ambulatory Visit: Payer: Self-pay | Admitting: Family Medicine

## 2022-03-04 DIAGNOSIS — G4700 Insomnia, unspecified: Secondary | ICD-10-CM

## 2022-03-04 DIAGNOSIS — F419 Anxiety disorder, unspecified: Secondary | ICD-10-CM

## 2022-03-24 ENCOUNTER — Other Ambulatory Visit: Payer: Self-pay | Admitting: Family Medicine

## 2022-04-01 ENCOUNTER — Other Ambulatory Visit: Payer: Self-pay | Admitting: Family Medicine

## 2022-04-01 DIAGNOSIS — F419 Anxiety disorder, unspecified: Secondary | ICD-10-CM

## 2022-04-01 DIAGNOSIS — G4700 Insomnia, unspecified: Secondary | ICD-10-CM

## 2022-04-06 DIAGNOSIS — R509 Fever, unspecified: Secondary | ICD-10-CM | POA: Diagnosis not present

## 2022-04-06 DIAGNOSIS — Z03818 Encounter for observation for suspected exposure to other biological agents ruled out: Secondary | ICD-10-CM | POA: Diagnosis not present

## 2022-04-06 DIAGNOSIS — U071 COVID-19: Secondary | ICD-10-CM | POA: Diagnosis not present

## 2022-04-06 DIAGNOSIS — Z20822 Contact with and (suspected) exposure to covid-19: Secondary | ICD-10-CM | POA: Diagnosis not present

## 2022-04-19 ENCOUNTER — Ambulatory Visit (HOSPITAL_COMMUNITY)
Admission: EM | Admit: 2022-04-19 | Discharge: 2022-04-19 | Disposition: A | Discharge status: Home / Self Care | Payer: BC Managed Care – PPO | Attending: Physician Assistant | Admitting: Physician Assistant

## 2022-04-19 DIAGNOSIS — Z8616 Personal history of COVID-19: Secondary | ICD-10-CM | POA: Diagnosis not present

## 2022-04-19 DIAGNOSIS — F315 Bipolar disorder, current episode depressed, severe, with psychotic features: Secondary | ICD-10-CM | POA: Diagnosis not present

## 2022-04-19 DIAGNOSIS — F410 Panic disorder [episodic paroxysmal anxiety] without agoraphobia: Secondary | ICD-10-CM | POA: Diagnosis not present

## 2022-04-19 DIAGNOSIS — F22 Delusional disorders: Secondary | ICD-10-CM

## 2022-04-19 DIAGNOSIS — Z1152 Encounter for screening for COVID-19: Secondary | ICD-10-CM | POA: Diagnosis not present

## 2022-04-19 LAB — POCT URINE DRUG SCREEN - MANUAL ENTRY (I-SCREEN)
POC Amphetamine UR: NOT DETECTED
POC Buprenorphine (BUP): NOT DETECTED
POC Cocaine UR: NOT DETECTED
POC Marijuana UR: NOT DETECTED
POC Methadone UR: NOT DETECTED
POC Methamphetamine UR: NOT DETECTED
POC Morphine: NOT DETECTED
POC Oxazepam (BZO): NOT DETECTED
POC Oxycodone UR: NOT DETECTED
POC Secobarbital (BAR): NOT DETECTED

## 2022-04-19 LAB — POC SARS CORONAVIRUS 2 AG: SARSCOV2ONAVIRUS 2 AG: NEGATIVE

## 2022-04-19 LAB — RESP PANEL BY RT-PCR (RSV, FLU A&B, COVID)  RVPGX2
Influenza A by PCR: NEGATIVE
Influenza B by PCR: NEGATIVE
Resp Syncytial Virus by PCR: NEGATIVE
SARS Coronavirus 2 by RT PCR: NEGATIVE

## 2022-04-19 MED ORDER — MAGNESIUM HYDROXIDE 400 MG/5ML PO SUSP: 30.0000 mL | Freq: Every day | ORAL | Status: DC | PRN

## 2022-04-19 MED ORDER — ALUM & MAG HYDROXIDE-SIMETH 200-200-20 MG/5ML PO SUSP: 30.0000 mL | ORAL | Status: DC | PRN

## 2022-04-19 MED ORDER — ARIPIPRAZOLE 5 MG PO TABS
5.0000 mg | ORAL_TABLET | Freq: Every day | ORAL | Status: DC
  Administered 2022-04-19: 5 mg via ORAL
  Filled 2022-04-19: qty 1

## 2022-04-19 MED ORDER — ACETAMINOPHEN 325 MG PO TABS
650.0000 mg | ORAL_TABLET | Freq: Four times a day (QID) | ORAL | Status: DC | PRN
  Administered 2022-04-19: 650 mg via ORAL
  Filled 2022-04-19: qty 2

## 2022-04-19 MED ORDER — ARIPIPRAZOLE 5 MG PO TABS: 5.0000 mg | ORAL_TABLET | Freq: Every day | ORAL | 0 refills | Status: DC

## 2022-04-19 MED ORDER — HYDROXYZINE HCL 25 MG PO TABS: 25.0000 mg | ORAL_TABLET | Freq: Three times a day (TID) | ORAL | Status: DC | PRN

## 2022-04-19 MED ORDER — TRAZODONE HCL 50 MG PO TABS: 50.0000 mg | ORAL_TABLET | Freq: Every evening | ORAL | Status: DC | PRN

## 2022-04-19 NOTE — ED Notes (Signed)
Rn tired to collect blood from patient was unsuccessful. Patient needs to hydrate ,then rn will try to collect at that time.
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
## 2022-04-19 NOTE — ED Provider Notes (Addendum)
FBC/OBS ASAP Discharge Summary  Date and Time: 04/19/2022 2:46 PM  Name: Bethany [name]  MRN:  [id number]   Discharge Diagnoses:  Final diagnoses:  Bipolar disorder, current episode depressed, severe, with psychotic features ([place])  Paranoia ([place])    Subjective:   Bethany [name] is a 46 year old, African-American female with a past psychiatric history significant for bipolar disorder who voluntarily presented to [facility] with a chief complaint of panic attacks, anxiety, paranoia, voices in her head.   Provider was informed by the nursing staff that the patient would like to be discharged from the facility. When asked the reason for discharge, patient stated that she couldn't tolerate the noise. She reported that the abilify that was given to her was helpful and is interested in receiving a bridge from now until her next appointment with Dr. Adele Schilder. Patient's next follow-up appointment with Dr. Adele Schilder is scheduled in late January.  Patient is denies suicidal or homicidal ideations. She denies active auditory or visual hallucinations and does not appear to be responding to internal/external stimuli. Patient denies being a danger to herself and is able to contract for safety,  Patient is casually dressed, alert and oriented x4. Patient is calm, cooperative, and fully engaged in conversation during the encounter. Patient maintains good eye contact. Patient's speech is clear, coherent, and with normal rate. Patient's thought process is coherent. Patient does not appear to be expressing any paranoid thought content.  Stay Summary:   Bethany [name] is a 46 year old, African-American female with a past psychiatric history significant for bipolar disorder who voluntarily presented to [facility] with a chief complaint of panic attacks, anxiety, paranoia, voices in her head.   Patient was seen a few hours ago reporting symptoms of  anxiety, panic attacks, paranoia, and auditory/visual hallucinations. Patient agreed to be admitted for overnight observation with the recommendation for inpatient psychiatry. Instead of waiting for placement at [facility], patient requested to be discharged from the facility. Patient denied auditory or visual hallucinations at the time of discharge. Patient also requested a bridge for her Abilify (a medication previous prescribed to her by Dr. Adele Schilder) because she found it helpful in managing her symptoms. Patient denies suicidal or homicidal ideations prior to discharge from the facility.  Total Time spent with patient: 15 minutes  Past Psychiatric History:  Bipolar disorder, mixed type  Past Medical History:  Past Medical History:  Diagnosis Date   Anemia    Anxiety    COVID-19    Depression    Fibrocystic disease of both breasts    History of cervical dysplasia    laser surgery   Hyperglycemia 10/14/2020   Hypokalemia 12/03/2021   Insomnia    Low blood sugar 2001   pt checks BS in AM, controls with diet   Routine cervical smear 06/20/2020   Routine health maintenance 05/09/2014   Surgery follow-up 10/19/2018   UTI (urinary tract infection) 11/01/2020   Vaginal Pap smear, abnormal     Past Surgical History:  Procedure Laterality Date   AUGMENTATION MAMMAPLASTY Bilateral    BREAST FIBROADENOMA SURGERY  2011, 2008   benign    BREAST RECONSTRUCTION WITH PLACEMENT OF TISSUE EXPANDER AND FLEX HD (ACELLULAR HYDRATED DERMIS) Bilateral 10/19/2018   Procedure: BREAST RECONSTRUCTION WITH PLACEMENT OF TISSUE EXPANDER AND FLEX HD (ACELLULAR HYDRATED DERMIS);  Surgeon: Wallace Going, DO;  Location: [facility];  Service: Plastics;  Laterality: Bilateral;   CESAREAN SECTION  04/22/1993   HYSTEROSCOPY WITH NOVASURE N/A 01/02/2016   Procedure: Rebbeca Paul;  Surgeon: Woodroe Mode, MD;  Location: [facility];  Service: Gynecology;  Laterality: N/A;   MASTECTOMY Bilateral 2019   NIPPLE  SPARING MASTECTOMY Bilateral 10/19/2018   Procedure: BILATERAL NIPPLE SPARING MASTECTOMY;  Surgeon: Jovita Kussmaul, MD;  Location: [facility];  Service: General;  Laterality: Bilateral;   REMOVAL OF BILATERAL TISSUE EXPANDERS WITH PLACEMENT OF BILATERAL BREAST IMPLANTS Bilateral 12/30/2018   Procedure: REMOVAL OF BILATERAL TISSUE EXPANDERS WITH PLACEMENT OF BILATERAL BREAST IMPLANTS;  Surgeon: Wallace Going, DO;  Location: [facility];  Service: Plastics;  Laterality: Bilateral;   TUBAL LIGATION  04/22/2001   Family History:  Family History  Problem Relation Age of Onset   Anxiety disorder Father    Depression Father    Diabetes Maternal Grandmother    Hypertension Paternal Grandfather    Cancer Maternal Aunt        breast   Breast cancer Maternal Aunt    Alcohol abuse Maternal Uncle    Alcohol abuse Cousin    Drug abuse Cousin    Stroke Neg Hx    Family Psychiatric History:   Social History:  Social History   Substance and Sexual Activity  Alcohol Use No   Alcohol/week: 0.0 standard drinks of alcohol     Social History   Substance and Sexual Activity  Drug Use No    Social History   Socioeconomic History   Marital status: Married    Spouse name: Not on file   Number of children: Not on file   Years of education: Not on file   Highest education level: Not on file  Occupational History   Not on file  Tobacco Use   Smoking status: Never   Smokeless tobacco: Never  Vaping Use   Vaping Use: Never used  Substance and Sexual Activity   Alcohol use: No    Alcohol/week: 0.0 standard drinks of alcohol   Drug use: No   Sexual activity: Yes    Partners: Male    Birth control/protection: Surgical  Other Topics Concern   Not on file  Social History Narrative   Married, 3 daughters.  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         Social Determinants of Health   Financial Resource Strain: Not on file  Food Insecurity: Not on file   Transportation Needs: Not on file  Physical Activity: Not on file  Stress: Not on file  Social Connections: Not on file   SDOH:  SDOH Screenings   Depression (PHQ2-9): Low Risk  (02/06/2022)  Recent Concern: Depression (PHQ2-9) - High Risk (01/11/2022)  Tobacco Use: Low Risk  (02/06/2022)    Tobacco Cessation:  N/A, patient does not currently use tobacco products  Current Medications:  Current Facility-Administered Medications  Medication Dose Route Frequency Provider Last Rate Last Admin   acetaminophen (TYLENOL) tablet 650 mg  650 mg Oral Q6H PRN Derell Bruun E, PA   650 mg at 04/19/22 1305   alum & mag hydroxide-simeth (MAALOX/MYLANTA) 200-200-20 MG/5ML suspension 30 mL  30 mL Oral Q4H PRN Zasha Belleau E, PA       ARIPiprazole (ABILIFY) tablet 5 mg  5 mg Oral Daily Tena Linebaugh E, PA   5 mg at 04/19/22 1305   hydrOXYzine (ATARAX) tablet 25 mg  25 mg Oral TID PRN Jaedan Huttner E, PA       magnesium hydroxide (MILK  OF MAGNESIA) suspension 30 mL  30 mL Oral Daily PRN Maymie Brunke E, PA       traZODone (DESYREL) tablet 50 mg  50 mg Oral QHS PRN Yazlyn Wentzel E, PA       Current Outpatient Medications  Medication Sig Dispense Refill   acetaminophen (TYLENOL) 650 MG CR tablet Take 1,300 mg by mouth every 8 (eight) hours. Takes it 2 to 3 times per day     [START ON 04/20/2022] ARIPiprazole (ABILIFY) 5 MG tablet Take 1 tablet (5 mg total) by mouth daily. 30 tablet 0   diclofenac Sodium (VOLTAREN ARTHRITIS PAIN) 1 % GEL Apply 4 g topically 4 (four) times daily. 50 g 1   hydrOXYzine (ATARAX) 50 MG tablet TAKE 1 TABLET BY MOUTH AT BEDTIME AS NEEDED. CAN TAKE UP TO 3 TIMES DAILY. 270 tablet 1   linaclotide (LINZESS) 145 MCG CAPS capsule Take 1 capsule (145 mcg total) by mouth daily before breakfast. 30 capsule 0   tiZANidine (ZANAFLEX) 2 MG tablet TAKE 1 TO 2 TABLETS (2-4 MG TOTAL) BY MOUTH EVERY 6 HOURS AS NEEDED (Patient taking differently: Takes 4 mg q6h) 30 tablet 3    PTA  Medications: (Not in a hospital admission)      02/06/2022    2:42 PM 01/11/2022   11:13 AM 12/03/2021    1:57 PM  Depression screen PHQ 2/9  Decreased Interest 0 0 1  Down, Depressed, Hopeless 0 0 0  PHQ - 2 Score 0 0 1  Altered sleeping 0 3 3  Tired, decreased energy 0 0 2  Change in appetite 0 3 1  Feeling bad or failure about yourself  0 1 2  Trouble concentrating 0 3 3  Moving slowly or fidgety/restless 0 2 1  Suicidal thoughts 0 0 0  PHQ-9 Score 0 12 13  Difficult doing work/chores Not difficult at all Somewhat difficult Somewhat difficult    Flowsheet Row ED from [facility]/19/2022 in [facility] ED from 11/11/2021 in [facility] from 09/29/2020 in [facility]PT  C-SSRS RISK CATEGORY No Risk No Risk No Risk       Musculoskeletal  Strength & Muscle Tone: within normal limits Gait & Station: normal Patient leans: N/A  Psychiatric Specialty Exam  Presentation  General Appearance:  Appropriate for Environment; Casual  Eye Contact: Good  Speech: Clear and Coherent; Normal Rate  Speech Volume: Decreased  Handedness: Right   Mood and Affect  Mood: Anxious; Depressed; Hopeless; Worthless  Affect: Depressed; Tearful   Thought Process  Thought Processes: Coherent; Goal Directed  Descriptions of Associations:Intact  Orientation:Full (Time, Place and Person)  Thought Content:Paranoid Ideation  Diagnosis of Schizophrenia or Schizoaffective disorder in past: No  Duration of Psychotic Symptoms: Less than six months   Hallucinations:Hallucinations: Auditory; Visual Description of Auditory Hallucinations: Patient's auditory hallucinations are characterized by a voice telling her "silence the voice and it will al stop." Description of Visual Hallucinations: Patient reports that she saw a monster trying to get to her when she woke up from her sleep on Christmas  night  Ideas of Reference:Paranoia  Suicidal Thoughts:Suicidal Thoughts: No  Homicidal Thoughts:Homicidal Thoughts: No   Sensorium  Memory: Immediate Fair; Recent Fair; Remote Fair  Judgment: Good  Insight: Fair   Community education officer  Concentration: Fair  Attention Span: Fair  Recall: [place] of Knowledge: Fair  Language: Good   Psychomotor Activity  Psychomotor Activity: Psychomotor Activity: Restlessness  Assets  Assets: Armed forces logistics/support/administrative officer; Desire for Improvement; Housing   Sleep  Sleep: Sleep: Poor   Nutritional Assessment (For OBS and FBC admissions only) Has the patient had a weight loss or gain of 10 pounds or more in the last 3 months?: No Has the patient had a decrease in food intake/or appetite?: Yes Does the patient have dental problems?: No Does the patient have eating habits or behaviors that may be indicators of an eating disorder including binging or inducing vomiting?: No Has the patient recently lost weight without trying?: 1 Has the patient been eating poorly because of a decreased appetite?: 1 Malnutrition Screening Tool Score: 2    Physical Exam  Physical Exam Constitutional:      Appearance: Normal appearance.  HENT:     Head: Normocephalic and atraumatic.     Nose: Nose normal.     Mouth/Throat:     Mouth: Mucous membranes are moist.  Eyes:     Extraocular Movements: Extraocular movements intact.  Cardiovascular:     Rate and Rhythm: Normal rate.  Pulmonary:     Effort: Pulmonary effort is normal.  Abdominal:     General: Abdomen is flat.  Musculoskeletal:     Cervical back: Normal range of motion and neck supple.  Skin:    General: Skin is warm and dry.  Neurological:     General: No focal deficit present.     Mental Status: She is alert and oriented to person, place, and time.  Psychiatric:        Attention and Perception: Attention and perception normal. She does not perceive auditory or visual  hallucinations.        Mood and Affect: Affect normal. Mood is anxious and depressed.        Speech: Speech normal.        Behavior: Behavior normal. Behavior is cooperative.        Thought Content: Thought content normal. Thought content is not paranoid or delusional. Thought content does not include homicidal or suicidal ideation.        Cognition and Memory: Cognition and memory normal.        Judgment: Judgment normal.    Review of Systems  Constitutional: Negative.   HENT: Negative.    Eyes: Negative.   Respiratory: Negative.    Cardiovascular: Negative.   Gastrointestinal: Negative.   Skin: Negative.   Neurological: Negative.   Psychiatric/Behavioral:  Positive for depression. Negative for hallucinations, substance abuse and suicidal ideas. The patient is nervous/anxious. The patient does not have insomnia.    There were no vitals taken for this visit. There is no height or weight on file to calculate BMI.  Demographic Factors:  Low socioeconomic status  Loss Factors: Financial problems/change in socioeconomic status  Historical Factors: NA  Risk Reduction Factors:   Living with another person, especially a relative  Continued Clinical Symptoms:  Severe Anxiety and/or Agitation Bipolar Disorder:   Depressive phase More than one psychiatric diagnosis  Cognitive Features That Contribute To Risk:  None    Suicide Risk:  Mild:  Suicidal ideation of limited frequency, intensity, duration, and specificity.  There are no identifiable plans, no associated intent, mild dysphoria and related symptoms, good self-control (both objective and subjective assessment), few other risk factors, and identifiable protective factors, including available and accessible social support.  Plan Of Care/Follow-up recommendations:   Activity: As tolerated  Diet: Heart healthy  Other: -Follow-up with your outpatient psychiatric provider -instructions on appointment date, time, and  address  (location) are provided to you in discharge paperwork.   -Take your psychiatric medications as prescribed at discharge - instructions are provided to you in the discharge paperwork.    -Follow-up with outpatient primary care doctor and other specialists -for management of preventative medicine and chronic medical disease, including:   -Testing: Follow-up with outpatient provider for abnormal lab results:  none   -Recommend abstinence from alcohol, tobacco, and other illicit drug use at discharge.    -If your psychiatric symptoms recur, worsen, or if you have side effects to your psychiatric medications, call your outpatient psychiatric provider, 911, [phone] or go to the nearest emergency department.   -If suicidal thoughts recur, call your outpatient psychiatric provider, 911, [phone] or go to the nearest emergency department.   Disposition: Patient to be discharged from this facility. Patient is denying suicidal or homicidal ideations. She further denies auditory or visual hallucinations and does not appear to be responding to internal/external stimuli. Patient to be provided resources for therapy.  Malachy Mood, PA 04/19/2022, 2:46 PM

## 2022-04-19 NOTE — ED Notes (Signed)
Patient A&O x 4, ambulatory. Patient discharged in no acute distress. Patient denied SI/HI, A/VH upon discharge. Patient verbalized understanding of all discharge instructions explained by staff, to include follow up appointments, RX's and safety plan. Pt belongings returned to patient from locker 17 intact. Patient escorted to lobby via staff for self transport to home via her car. Safety maintained.

## 2022-04-19 NOTE — ED Notes (Signed)
Patients belongings were place in locker 17.She had a purse cell phone keys, shoes coat,2 necklace,1 earring,

## 2022-04-19 NOTE — Progress Notes (Signed)
   04/19/22 1029  [place] (Walk-ins at [facility] only)  How Did You Hear About [name]? Self  What Is the Reason for Your Visit/Call Today? Pt reports that she is having a crisis right now and reports that she can not get her anxiety under control. Patient reports having a panic/anxiety attacks since Christmas and for the past 10 days she not slept well. Pt sees Dr. Barnetta Chapel for medication management and she is taking hydroxyzine for anxiety.   Pt reports she is hearing voice that says "just silence the voice". Pt denies SI, HI, AVH and substance use.  How Long Has This Been Causing You Problems? 1 wk - 1 month  Have You Recently Had Any Thoughts About Hurting Yourself? No  Are You Planning to Commit Suicide/Harm Yourself At This time? No  Have you Recently Had Thoughts About [place]? No  Are You Planning To Harm Someone At This Time? No  Are you currently experiencing any auditory, visual or other hallucinations? Yes  Please explain the hallucinations you are currently experiencing: AH voices saying "silence the voices"  Have You Used Any Alcohol or Drugs in the Past 24 Hours? No  Do you have any current medical co-morbidities that require immediate attention? No  Clinician description of patient physical appearance/behavior: tearful, anxious  What Do You Feel Would Help You the Most Today? Treatment for Depression or other mood problem  If access to [facility] was not available, would you have sought care in the Emergency Department? No  Determination of Need Routine (7 days)  Options For Referral Medication Management;Outpatient Therapy

## 2022-04-19 NOTE — ED Provider Notes (Signed)
[facility] Continuous Assessment Admission H&P  Date: 04/19/22 Patient Name: Bethany [name] MRN: [id number] Chief Complaint:  Chief Complaint  Patient presents with   Panic Attack   Anxiety      Diagnoses:  Final diagnoses:  Bipolar disorder, current episode depressed, severe, with psychotic features ([place])  Paranoia ([place])    HPI:   Bethany [name] is a 46 year old, African-American female with a past psychiatric history significant for bipolar disorder who voluntarily presented to [facility] with a chief complaint of panic attacks, anxiety, paranoia, voices in her head.  Patient states that her symptoms started on Christmas Day and have only worsened.  Patient denies any discernible triggers to her current symptoms.  Patient also denies any major life events or changes.  While providing her history, patient states "I know I am not crazy."  In regards to her auditory hallucinations, patient states that the name of the voice she hears is "Bethany [name]."  She states that "Bethany [name]" told her that she has always been present.  Patient reports that the voices told her "just found the voices and ill all." Patient states "Something is happening, this is not me." When asked to describe her paranoia, patient states "they want the money. I don't know what they want. I don't know, I just don't know."   Patient states that she is currently taking hydroxyzine for anxiety and has been on the medication for a while.  She reports that the medication was helpful in the beginning, but does not know what happened.  Patient reports that she does not remember what medications she has been on in the past and states that she has never received help from this facility in the past for her mental health.  She reports that something has happened to her but she does not know what has happened.  She reports that something is trying to take control of her.  She states that if she finds  the voice that I will stop, but she does not know how to stop the voices.  She reports that she feels like she is fat and maze.  Patient rates her anxiety at 10 out of 10.  In addition to anxiety, patient endorses depression.  Patient endorses the following depressive symptoms: low mood decreased energy, decreased concentration, irritability, crying spells, decreased appetite, terrible sleep, hopelessness, and worthlessness.  Patient reports that she used to be seen by Dr. Adele Schilder for the management of her mental health.  She reports that she was given hydroxyzine in the past by Dr. Adele Schilder but is now prescribed hydroxyzine by her primary care provider.  Patient states that she was given hydroxyzine because she was unable to sleep.  Patient is not currently seeing a psychiatrist at this time but states that she has scheduled an appointment with Dr. Adele Schilder for late January.  Patient denies a past history of hospitalization due to mental health.  Patient denies a past history of suicide attempt nor has she engaged in self-harm.  Patient denies suicidal or homicidal ideation.  She endorses auditory hallucinations as well as visual hallucinations.  She reports that on Christmas night, she woke up in the middle of the night and saw a monster that was trying to get to her.  After witnessing the monsters, patient states that she ordered several cameras and installed them yesterday night.  Patient endorses decreased appetite stating that all she feels she needs are french fries and coke.  Patient denies alcohol  consumption, tobacco use, and illicit drug use.  Patient denies being a danger to herself but states that her current symptoms are very debilitating and feels she needs to be placed on the right medication to help manage her symptoms.  Patient is casually dressed, alert and oriented x 4.  Patient is tearful during the assessment but is able to answer most questions addressed to her.  Patient maintains fair eye  contact.  Patient's speech is clear, coherent, and with normal rate.  Patient's thought process is coherent.  Patient exhibits paranoid thought content.  Patient endorses depression and anxiety with congruent affect.  Patient denies suicidal ideations.  Although patient endorses auditory and visual hallucinations, patient does not appear to be responding to internal/external stimuli.  PHQ 2-9:  [place] Office Visit from 02/06/2022 in [place] Office Visit from 01/11/2022 in [place] Office Visit from 12/03/2021 in [place]  Thoughts that you would be better off dead, or of hurting yourself in some way Not at all Not at all Not at all  PHQ-9 Total Score 0 12 13       [place] [facility] from 11/11/2021 in [place] [facility] from 09/29/2020 in [place] [facility] Video Visit from 07/18/2020 in [place] No Risk No Risk No Risk        Total Time spent with patient: 45 minutes  Musculoskeletal  Strength & Muscle Tone: within normal limits Gait & Station: normal Patient leans: N/A  Psychiatric Specialty Exam  Presentation General Appearance:  Appropriate for Environment; Casual  Eye Contact: Good  Speech: Clear and Coherent; Normal Rate  Speech Volume: Decreased  Handedness: Right   Mood and Affect  Mood: Anxious; Depressed; Hopeless; Worthless  Affect: Depressed; Tearful   Thought Process  Thought Processes: Coherent; Goal Directed  Descriptions of Associations:Intact  Orientation:Full (Time, Place and Person)  Thought Content:Paranoid Ideation  Diagnosis of Schizophrenia or Schizoaffective disorder in past: No  Duration of Psychotic Symptoms: Less than six months  Hallucinations:Hallucinations: Auditory; Visual Description of Auditory Hallucinations: Patient's  auditory hallucinations are characterized by a voice telling her "silence the voice and it will al stop." Description of Visual Hallucinations: Patient reports that she saw a monster trying to get to her when she woke up from her sleep on Christmas night  Ideas of Reference:Paranoia  Suicidal Thoughts:Suicidal Thoughts: No  Homicidal Thoughts:Homicidal Thoughts: No   Sensorium  Memory: Immediate Fair; Recent Fair; Remote Fair  Judgment: Good  Insight: Fair   Community education officer  Concentration: Fair  Attention Span: Fair  Recall: [employer] of Knowledge: Fair  Language: Good   Psychomotor Activity  Psychomotor Activity: Psychomotor Activity: Restlessness   Assets  Assets: Communication Skills; Desire for Improvement; Housing   Sleep  Sleep: Sleep: Poor   Nutritional Assessment (For OBS and FBC admissions only) Has the patient had a weight loss or gain of 10 pounds or more in the last 3 months?: No Has the patient had a decrease in food intake/or appetite?: Yes Does the patient have dental problems?: No Does the patient have eating habits or behaviors that may be indicators of an eating disorder including binging or inducing vomiting?: No Has the patient recently lost weight without trying?: 1 Has the patient been eating poorly because of a decreased appetite?: 1 Malnutrition Screening Tool Score: 2    Physical Exam Constitutional:  Appearance: Normal appearance.  HENT:     Head: Normocephalic and atraumatic.     Nose: Nose normal.     Mouth/Throat:     Mouth: Mucous membranes are moist.  Eyes:     Extraocular Movements: Extraocular movements intact.  Cardiovascular:     Rate and Rhythm: Normal rate.  Pulmonary:     Effort: Pulmonary effort is normal.  Abdominal:     General: Abdomen is flat.  Musculoskeletal:     Cervical back: Normal range of motion.  Skin:    General: Skin is warm and dry.  Neurological:     General: No focal  deficit present.     Mental Status: She is alert and oriented to person, place, and time.  Psychiatric:        Attention and Perception: Attention normal. She perceives auditory and visual hallucinations.        Mood and Affect: Mood is anxious and depressed. Affect is blunt and tearful.        Speech: Speech normal.        Behavior: Behavior normal. Behavior is cooperative.        Thought Content: Thought content is paranoid. Thought content is not delusional. Thought content does not include homicidal or suicidal ideation.        Cognition and Memory: Cognition and memory normal.        Judgment: Judgment normal.    Review of Systems  Constitutional: Negative.   HENT: Negative.    Eyes: Negative.   Respiratory: Negative.    Cardiovascular: Negative.   Gastrointestinal:  Positive for nausea.  Skin: Negative.   Neurological: Negative.   Psychiatric/Behavioral:  Positive for depression and hallucinations. Negative for substance abuse and suicidal ideas. The patient is nervous/anxious. The patient does not have insomnia.     There were no vitals taken for this visit. There is no height or weight on file to calculate BMI.  Past Psychiatric History:  Per patient's chart, patient was being treated for bipolar disorder by Dr. Adele Schilder.  Patient was last seen by this provider on 05/29/2020.  Patient was previously being given Abilify 20 mg daily by Dr. Adele Schilder and states that the medication was beneficial in managing her symptoms.  Is the patient at risk to self? No  Has the patient been a risk to self in the past 6 months? No .    Has the patient been a risk to self within the distant past? No   Is the patient a risk to others? No   Has the patient been a risk to others in the past 6 months? No   Has the patient been a risk to others within the distant past? No   Past Medical History:  Past Medical History:  Diagnosis Date   Anemia    Anxiety    COVID-19    Depression    Fibrocystic  disease of both breasts    History of cervical dysplasia    laser surgery   Hyperglycemia 10/14/2020   Hypokalemia 12/03/2021   Insomnia    Low blood sugar 2001   pt checks BS in AM, controls with diet   Routine cervical smear 06/20/2020   Routine health maintenance 05/09/2014   Surgery follow-up 10/19/2018   UTI (urinary tract infection) 11/01/2020   Vaginal Pap smear, abnormal     Past Surgical History:  Procedure Laterality Date   AUGMENTATION MAMMAPLASTY Bilateral    BREAST FIBROADENOMA SURGERY  2011, 2008   benign  BREAST RECONSTRUCTION WITH PLACEMENT OF TISSUE EXPANDER AND FLEX HD (ACELLULAR HYDRATED DERMIS) Bilateral 10/19/2018   Procedure: BREAST RECONSTRUCTION WITH PLACEMENT OF TISSUE EXPANDER AND FLEX HD (ACELLULAR HYDRATED DERMIS);  Surgeon: Wallace Going, DO;  Location: [facility];  Service: Plastics;  Laterality: Bilateral;   CESAREAN SECTION  04/22/1993   HYSTEROSCOPY WITH NOVASURE N/A 01/02/2016   Procedure: NOVASURE;  Surgeon: Woodroe Mode, MD;  Location: [facility];  Service: Gynecology;  Laterality: N/A;   MASTECTOMY Bilateral 2019   NIPPLE SPARING MASTECTOMY Bilateral 10/19/2018   Procedure: BILATERAL NIPPLE SPARING MASTECTOMY;  Surgeon: Jovita Kussmaul, MD;  Location: [facility];  Service: General;  Laterality: Bilateral;   REMOVAL OF BILATERAL TISSUE EXPANDERS WITH PLACEMENT OF BILATERAL BREAST IMPLANTS Bilateral 12/30/2018   Procedure: REMOVAL OF BILATERAL TISSUE EXPANDERS WITH PLACEMENT OF BILATERAL BREAST IMPLANTS;  Surgeon: Wallace Going, DO;  Location: [facility];  Service: Plastics;  Laterality: Bilateral;   TUBAL LIGATION  04/22/2001    Family History:  Family History  Problem Relation Age of Onset   Anxiety disorder Father    Depression Father    Diabetes Maternal Grandmother    Hypertension Paternal Grandfather    Cancer Maternal Aunt        breast   Breast cancer Maternal Aunt    Alcohol abuse Maternal Uncle    Alcohol abuse Cousin     Drug abuse Cousin    Stroke Neg Hx     Social History:  Social History   Socioeconomic History   Marital status: Married    Spouse name: Not on file   Number of children: Not on file   Years of education: Not on file   Highest education level: Not on file  Occupational History   Not on file  Tobacco Use   Smoking status: Never   Smokeless tobacco: Never  Vaping Use   Vaping Use: Never used  Substance and Sexual Activity   Alcohol use: No    Alcohol/week: 0.0 standard drinks of alcohol   Drug use: No   Sexual activity: Yes    Partners: Male    Birth control/protection: Surgical  Other Topics Concern   Not on file  Social History Narrative   Married, 3 daughters.  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         Social Determinants of Health   Financial Resource Strain: Not on file  Food Insecurity: Not on file  Transportation Needs: Not on file  Physical Activity: Not on file  Stress: Not on file  Social Connections: Not on file  Intimate Partner Violence: Not on file    SDOH:  SDOH Screenings   Depression (PHQ2-9): Low Risk  (02/06/2022)  Recent Concern: Depression (PHQ2-9) - High Risk (01/11/2022)  Tobacco Use: Low Risk  (02/06/2022)    Last Labs:  Office Visit on 01/11/2022  Component Date Value Ref Range Status   HIV Screen 4th Generation wRfx 01/11/2022 Non Reactive  Non Reactive Final   Comment: HIV Negative HIV-1/HIV-2 antibodies and HIV-1 p24 antigen were NOT detected. There is no laboratory evidence of HIV infection.    RPR Ser Ql 01/11/2022 Non Reactive  Non Reactive Final   Neisseria Gonorrhea 01/11/2022 Negative   Final   Chlamydia 01/11/2022 Negative   Final   Trichomonas 01/11/2022 Negative   Final   Bacterial Vaginitis (gardnerella) 01/11/2022 Negative   Final   Candida Vaginitis 01/11/2022 Positive (A)   Final  Candida Glabrata 01/11/2022 Negative   Final   Comment 01/11/2022 Normal Reference Range Bacterial  Vaginosis - Negative   Final   Comment 01/11/2022 Normal Reference Range Candida Species - Negative   Final   Comment 01/11/2022 Normal Reference Range Candida Galbrata - Negative   Final   Comment 01/11/2022 Normal Reference Range Trichomonas - Negative   Final   Comment 01/11/2022 Normal Reference Ranger Chlamydia - Negative   Final   Comment 01/11/2022 Normal Reference Range Neisseria Gonorrhea - Negative   Final  Office Visit on 12/13/2021  Component Date Value Ref Range Status   Hemoglobin A1C 12/13/2021 5.6  4.0 - 5.6 % Final  Office Visit on 12/03/2021  Component Date Value Ref Range Status   Glucose 12/03/2021 91  70 - 99 mg/dL Final   BUN 12/03/2021 7  6 - 24 mg/dL Final   Creatinine, Ser 12/03/2021 0.81  0.57 - 1.00 mg/dL Final   eGFR 12/03/2021 90  >59 mL/min/1.73 Final   BUN/Creatinine Ratio 12/03/2021 9  9 - 23 Final   Sodium 12/03/2021 141  134 - 144 mmol/L Final   Potassium 12/03/2021 4.3  3.5 - 5.2 mmol/L Final   Chloride 12/03/2021 101  96 - 106 mmol/L Final   CO2 12/03/2021 22  20 - 29 mmol/L Final   Calcium 12/03/2021 10.1  8.7 - 10.2 mg/dL Final   Total Protein 12/03/2021 7.9  6.0 - 8.5 g/dL Final   Albumin 12/03/2021 5.0 (H)  3.9 - 4.9 g/dL Final   Globulin, Total 12/03/2021 2.9  1.5 - 4.5 g/dL Final   Albumin/Globulin Ratio 12/03/2021 1.7  1.2 - 2.2 Final   Bilirubin Total 12/03/2021 0.2  0.0 - 1.2 mg/dL Final   Alkaline Phosphatase 12/03/2021 56  44 - 121 IU/L Final   AST 12/03/2021 18  0 - 40 IU/L Final   ALT 12/03/2021 15  0 - 32 IU/L Final  Admission on 11/11/2021, Discharged on 11/11/2021  Component Date Value Ref Range Status   Glucose-Capillary 11/11/2021 103 (H)  70 - 99 mg/dL Final   Glucose reference range applies only to samples taken after fasting for at least 8 hours.   Sodium 11/11/2021 137  135 - 145 mmol/L Final   Potassium 11/11/2021 2.8 (L)  3.5 - 5.1 mmol/L Final   Chloride 11/11/2021 106  98 - 111 mmol/L Final   CO2 11/11/2021 22  22 - 32  mmol/L Final   Glucose, Bld 11/11/2021 115 (H)  70 - 99 mg/dL Final   Glucose reference range applies only to samples taken after fasting for at least 8 hours.   BUN 11/11/2021 8  6 - 20 mg/dL Final   Creatinine, Ser 11/11/2021 0.81  0.44 - 1.00 mg/dL Final   Calcium 11/11/2021 8.8 (L)  8.9 - 10.3 mg/dL Final   Total Protein 11/11/2021 6.9  6.5 - 8.1 g/dL Final   Albumin 11/11/2021 4.1  3.5 - 5.0 g/dL Final   AST 11/11/2021 17  15 - 41 U/L Final   ALT 11/11/2021 11  0 - 44 U/L Final   Alkaline Phosphatase 11/11/2021 39  38 - 126 U/L Final   Total Bilirubin 11/11/2021 0.3  0.3 - 1.2 mg/dL Final   GFR, Estimated 11/11/2021 >60  >60 mL/min Final   Comment: (NOTE) Calculated using the CKD-EPI Creatinine Equation (2021)    Anion gap 11/11/2021 9  5 - 15 Final   Performed at [facility] [address] [place], [place] [address]  Alcohol, Ethyl (B) 11/11/2021 <10  <10 mg/dL Final   Comment: (NOTE) Lowest detectable limit for serum alcohol is 10 mg/dL.  For medical purposes only. Performed at [facility], [place] [address], [place],  [address]    Opiates 11/11/2021 NONE DETECTED  NONE DETECTED Final   Cocaine 11/11/2021 NONE DETECTED  NONE DETECTED Final   Benzodiazepines 11/11/2021 POSITIVE (A)  NONE DETECTED Final   Amphetamines 11/11/2021 NONE DETECTED  NONE DETECTED Final   Tetrahydrocannabinol 11/11/2021 POSITIVE (A)  NONE DETECTED Final   Barbiturates 11/11/2021 NONE DETECTED  NONE DETECTED Final   Comment: (NOTE) DRUG SCREEN FOR MEDICAL PURPOSES ONLY.  IF CONFIRMATION IS NEEDED FOR ANY PURPOSE, NOTIFY LAB WITHIN 5 DAYS.  LOWEST DETECTABLE LIMITS FOR URINE DRUG SCREEN Drug Class                     Cutoff (ng/mL) Amphetamine and metabolites    1000 Barbiturate and metabolites    200 Benzodiazepine                 578 Tricyclics and metabolites     300 Opiates and metabolites        300 Cocaine and metabolites        300 THC                             50 Performed at [facility], [place] [address] [place], [place] [address]    WBC 11/11/2021 4.7  4.0 - 10.5 K/uL Final   RBC 11/11/2021 3.94  3.87 - 5.11 MIL/uL Final   Hemoglobin 11/11/2021 11.6 (L)  12.0 - 15.0 g/dL Final   HCT 11/11/2021 35.0 (L)  36.0 - 46.0 % Final   MCV 11/11/2021 88.8  80.0 - 100.0 fL Final   MCH 11/11/2021 29.4  26.0 - 34.0 pg Final   MCHC 11/11/2021 33.1  30.0 - 36.0 g/dL Final   RDW 11/11/2021 13.3  11.5 - 15.5 % Final   Platelets 11/11/2021 209  150 - 400 K/uL Final   nRBC 11/11/2021 0.0  0.0 - 0.2 % Final   Neutrophils Relative % 11/11/2021 56  % Final   Neutro Abs 11/11/2021 2.6  1.7 - 7.7 K/uL Final   Lymphocytes Relative 11/11/2021 35  % Final   Lymphs Abs 11/11/2021 1.6  0.7 - 4.0 K/uL Final   Monocytes Relative 11/11/2021 8  % Final   Monocytes Absolute 11/11/2021 0.4  0.1 - 1.0 K/uL Final   Eosinophils Relative 11/11/2021 1  % Final   Eosinophils Absolute 11/11/2021 0.0  0.0 - 0.5 K/uL Final   Basophils Relative 11/11/2021 0  % Final   Basophils Absolute 11/11/2021 0.0  0.0 - 0.1 K/uL Final   Immature Granulocytes 11/11/2021 0  % Final   Abs Immature Granulocytes 11/11/2021 0.01  0.00 - 0.07 K/uL Final   Performed at [facility], [place] [address], [place],  [address]   I-stat hCG, quantitative 11/11/2021 <5.0  <5 mIU/mL Final   Comment 3 11/11/2021          Final   Comment:   GEST. AGE      CONC.  (mIU/mL)   <=1 WEEK        5 - 50     2 WEEKS       50 - 500     3 WEEKS       100 -  10,000     4 WEEKS     1,000 - 30,000        FEMALE AND NON-PREGNANT FEMALE:     LESS THAN 5 mIU/mL     Allergies: Lamictal [lamotrigine], Codeine, Oxycodone hcl, and Penicillins  PTA Medications: (Not in a hospital admission)   Medical Decision Making  Patient presents to this facility with a chief complaint of anxiety, panic attacks, paranoia, and auditory/visual hallucinations.  Patient reports that these symptoms have been going on since  Christmas.  Patient denies suicidal or homicidal ideations but continues to experience depression, anxiety, auditory/visual hallucinations and paranoia.  Based on my evaluation of the patient, patient is recommended for overnight observation but would benefit from psychiatric hospitalization for the management of her psychotic features.  Provider to order and initiate admission labs prior to patient being admitted onto the unit.  Patient to also be placed on Abilify 5 mg daily due to patient being on Abilify 20 mg daily when last seen by Dr. Adele Schilder.    Recommendations  Based on my evaluation the patient does not appear to have an emergency medical condition.  Patient to be admitted for continuous observation with the recommendation for inpatient psychiatry due to worsening paranoia and auditory/visual hallucinations in the setting of worsening mood.  Malachy Mood, PA 04/19/22  11:49 AM

## 2022-04-19 NOTE — BH Assessment (Deleted)
Having a crisis right now. High anxiety can't get it under control where I can focus. Having a panic/anxiety attacks not sleeping well 4 hrs last night. Hydroxyzine.  Since Christmas  Dr. Barnetta Chapel medication management  Hearing voice just silence the voice.

## 2022-04-19 NOTE — Discharge Instructions (Signed)
Please contact one of the following facilities to start medication management and therapy services:  ? ?[facility] ?[address]  ?[place], [place] [address] ?[phone]  ? ?[facility]  ?[address] ?[place], [place] [address] ?[phone] ? ?[facility]  ?[address], [place], [place] [address] ?[phone] ? ?[facility]  ?[address]  ?[place], [place] [address] ?[phone] ? ?[facility]  ?[address] ?[place], [place] [address] ?[phone] ? ?[facility]  ?[address],  ?[place], [place] [address] ?[phone]  ?

## 2022-05-05 ENCOUNTER — Other Ambulatory Visit (HOSPITAL_COMMUNITY): Payer: Self-pay | Admitting: Physician Assistant

## 2022-05-07 NOTE — Telephone Encounter (Signed)
Patient was last seen at [facility]. During her assessment, it was determined that patient has taken Abilify in the past with some success in the management of her symptoms. Patient was discharged from the facility and samples of her Abilify were sent to a pharmacy of her choosing. Patient has an appointment scheduled with [name], MD on 05/15/2022. Provider to provide a 7-day supply of Abilify to last her until her next behavioral health appointment.

## 2022-05-09 ENCOUNTER — Other Ambulatory Visit: Payer: Self-pay | Admitting: Family Medicine

## 2022-05-15 ENCOUNTER — Ambulatory Visit (HOSPITAL_BASED_OUTPATIENT_CLINIC_OR_DEPARTMENT_OTHER): Payer: BC Managed Care – PPO | Admitting: Psychiatry

## 2022-05-15 ENCOUNTER — Encounter (HOSPITAL_COMMUNITY): Payer: Self-pay | Admitting: Psychiatry

## 2022-05-15 VITALS — Wt 138.0 lb

## 2022-05-15 DIAGNOSIS — G4700 Insomnia, unspecified: Secondary | ICD-10-CM

## 2022-05-15 DIAGNOSIS — F3160 Bipolar disorder, current episode mixed, unspecified: Secondary | ICD-10-CM

## 2022-05-15 DIAGNOSIS — F419 Anxiety disorder, unspecified: Secondary | ICD-10-CM

## 2022-05-15 MED ORDER — ARIPIPRAZOLE 20 MG PO TABS: 20.0000 mg | ORAL_TABLET | Freq: Every day | ORAL | 0 refills | Status: DC
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
## 2022-05-29 ENCOUNTER — Telehealth (HOSPITAL_BASED_OUTPATIENT_CLINIC_OR_DEPARTMENT_OTHER): Payer: BC Managed Care – PPO | Admitting: Psychiatry

## 2022-05-29 ENCOUNTER — Encounter (HOSPITAL_COMMUNITY): Payer: Self-pay | Admitting: Psychiatry

## 2022-05-29 VITALS — Wt 130.0 lb

## 2022-05-29 DIAGNOSIS — G4700 Insomnia, unspecified: Secondary | ICD-10-CM

## 2022-05-29 DIAGNOSIS — F419 Anxiety disorder, unspecified: Secondary | ICD-10-CM | POA: Diagnosis not present

## 2022-05-29 DIAGNOSIS — F3160 Bipolar disorder, current episode mixed, unspecified: Secondary | ICD-10-CM

## 2022-05-29 MED ORDER — ARIPIPRAZOLE 20 MG PO TABS: 20.0000 mg | ORAL_TABLET | Freq: Every day | ORAL | 0 refills | Status: DC

## 2022-05-29 MED ORDER — TRAZODONE HCL 50 MG PO TABS: 50.0000 mg | ORAL_TABLET | Freq: Every day | ORAL | 0 refills | Status: DC

## 2022-05-29 NOTE — Progress Notes (Signed)
Virtual Visit via Video Note  I connected with Bethany [name] on 05/29/22 at  3:40 PM EST by a video enabled telemedicine application and verified that I am speaking with the correct person using two identifiers.  Location: Patient: In Car Provider: Home Office   I discussed the limitations of evaluation and management by telemedicine and the availability of in person appointments. The patient expressed understanding and agreed to proceed.  History of Present Illness: Patient is evaluated by video session.  She is a 47 year old African-American female with history of bipolar disorder, anxiety and insomnia.  We started her on higher dose of Abilify.  She is not taking 20 mg daily.  She noticed improvement in her paranoia and hallucination but is still she had passive and fleeting suicidal thoughts, paranoia.  Occasionally she hear and see demons and spirits but able to control her emotions.  She denies any mania or agitation.  She struggle with sleep.  Though she has no issue going into sleep but she does not stays in sleep.  She takes tizanidine for muscle spasm.  She has mild tremors and shakes.  She is sad because her disability denied but she is going to appeal.  Today she is more calm and less emotional.  She is hoping that medicine will work however she need a Furniture conservator/restorer.  She is in therapy through the church.  Since the last visit she has not used any Gummies or use any drugs or alcohol.  She lives with her mother.  She is not back to work since 2021.  She endorsed financial strain but mother is very supportive and helpful.  She is trying to lose weight and she has lost few pounds since the last visit.  She is taking hydroxyzine which helped some of anxiety but does not help her sleep.  Patient has difficulty walking and she uses cane.  She does not drive.   Past Psychiatric History: Reviewed. Saw therapist in [facility] after brother killed.  Took Zoloft and lorazepam by PCP.   She took Lamictal but stopped after the rash.  Trazodone helped but wanted to try something for anxiety and it was switched to hydroxyzine.  No h/o inpatient treatment, suicidal attempt, psychosis or any mania.  H/O irritability, anger and mood swing.  Tried Lamictal but stopped due to rash.     Psychiatric Specialty Exam: Physical Exam  Review of Systems  Musculoskeletal:  Positive for back pain.    Weight 130 lb (59 kg).There is no height or weight on file to calculate BMI.  General Appearance: Casual  Eye Contact:  Fair  Speech:  Slow  Volume:  Decreased  Mood:  Anxious and Dysphoric  Affect:  Constricted and Depressed  Thought Process:  Descriptions of Associations: Intact  Orientation:  Full (Time, Place, and Person)  Thought Content:  Hallucinations: Auditory Visual Seeing and hearing demens and sprits, Paranoid Ideation, and Rumination  Suicidal Thoughts:  Yes.  without intent/plan  Homicidal Thoughts:  No  Memory:  Immediate;   Good Recent;   Fair Remote;   Fair  Judgement:  Fair  Insight:  Shallow  Psychomotor Activity:  Decreased and Tremor  Concentration:  Concentration: Fair and Attention Span: Fair  Recall:  [employer] of Knowledge:  Good  Language:  Good  Akathisia:  No  Handed:  Right  AIMS (if indicated):     Assets:  Communication Skills Desire for Improvement Housing  ADL's:  Intact  Cognition:  WNL  Sleep:   poor, does not stay sleep      Assessment and Plan: Bipolar disorder, mixed type.  Anxiety.  Primary insomnia.  Patient showing marginal improvement from the past.  She is still like to give more time to Abilify 20 mg that had helped her in the past.  It is now 2 weeks since taking the Abilify and no significant side effects other than mild tremors.  Her symptoms are less but still present.  I recommend try trazodone 50 mg at bedtime to help her sleep which she has done in the past.  She is sad because her disability denied but she is going for  appeal.  Continue hydroxyzine 10 mg 3 times a day and she has enough refill.  Continue Abilify 20 mg daily.  I will refer her to see a therapist in our office.  Recommended to call us back if is any question or any concern.  Discussed safety concerns and any time having active suicidal thoughts or homicidal thought that she need to call 911 or go to local emergency room.  Follow-up in 4 weeks.  Follow Up Instructions:    I discussed the assessment and treatment plan with the patient. The patient was provided an opportunity to ask questions and all were answered. The patient agreed with the plan and demonstrated an understanding of the instructions.   The patient was advised to call back or seek an in-person evaluation if the symptoms worsen or if the condition fails to improve as anticipated.  Collaboration of Care: Other provider involved in patient's care AEB notes are available in epic to review.  Patient/Guardian was advised Release of Information must be obtained prior to any record release in order to collaborate their care with an outside provider. Patient/Guardian was advised if they have not already done so to contact the registration department to sign all necessary forms in order for [name] to release information regarding their care.   Consent: Patient/Guardian gives verbal consent for treatment and assignment of benefits for services provided during this visit. Patient/Guardian expressed understanding and agreed to proceed.    I provided 21 minutes of non-face-to-face time during this encounter.   [name], MD

## 2022-06-03 ENCOUNTER — Other Ambulatory Visit (HOSPITAL_COMMUNITY): Payer: Self-pay | Admitting: Psychiatry

## 2022-06-03 DIAGNOSIS — G4700 Insomnia, unspecified: Secondary | ICD-10-CM

## 2022-06-03 DIAGNOSIS — F3160 Bipolar disorder, current episode mixed, unspecified: Secondary | ICD-10-CM

## 2022-06-04 ENCOUNTER — Ambulatory Visit (INDEPENDENT_AMBULATORY_CARE_PROVIDER_SITE_OTHER): Payer: BC Managed Care – PPO | Admitting: Family Medicine

## 2022-06-04 VITALS — BP 100/60 | HR 80 | Wt 140.0 lb

## 2022-06-04 DIAGNOSIS — A084 Viral intestinal infection, unspecified: Secondary | ICD-10-CM

## 2022-06-04 MED ORDER — PROMETHAZINE HCL 12.5 MG PO TABS: 12.5000 mg | ORAL_TABLET | Freq: Four times a day (QID) | ORAL | 0 refills | Status: DC | PRN

## 2022-06-04 NOTE — Patient Instructions (Addendum)
It was nice seeing you today!  Take Phenergan as needed for nausea and vomiting.  Stay well, Zola Button, MD [place] [phone]  --  Make sure to check out at the front desk before you leave today.  Please arrive at least 15 minutes prior to your scheduled appointments.  If you had blood work today, I will send you a MyChart message or a letter if results are normal. Otherwise, I will give you a call.  If you had a referral placed, they will call you to set up an appointment. Please give [name] a call if you don't hear back in the next 2 weeks.  If you need additional refills before your next appointment, please call your pharmacy first.

## 2022-06-04 NOTE — Progress Notes (Signed)
    SUBJECTIVE:   CHIEF COMPLAINT / HPI:  Chief Complaint  Patient presents with   Emesis    Started feeling ill today Coughing and had 10 episodes of vomiting, feeling nauseous Also some chest congestion Denies fever, diarrhea No recent travel, sick contacts Had [place] early this AM  PERTINENT  PMH / PSH: Bipolar disorder  Patient Care Team: Jacelyn Grip, MD as PCP - General (Family Medicine)   OBJECTIVE:   BP 100/60   Pulse 80   Wt 140 lb (63.5 kg)   SpO2 98%   BMI 24.03 kg/m   Physical Exam Constitutional:      General: She is not in acute distress. HENT:     Head: Normocephalic and atraumatic.     Mouth/Throat:     Mouth: Mucous membranes are moist.     Pharynx: Oropharynx is clear. No oropharyngeal exudate or posterior oropharyngeal erythema.  Cardiovascular:     Rate and Rhythm: Normal rate and regular rhythm.  Pulmonary:     Effort: Pulmonary effort is normal. No respiratory distress.     Breath sounds: Normal breath sounds.  Abdominal:     Palpations: Abdomen is soft.     Tenderness: There is abdominal tenderness.     Comments: Mild diffuse abdominal tenderness  Musculoskeletal:     Cervical back: Neck supple.  Lymphadenopathy:     Cervical: No cervical adenopathy.  Neurological:     Mental Status: She is alert.         06/04/2022   10:52 AM  Depression screen PHQ 2/9  Decreased Interest 3  Down, Depressed, Hopeless 3  PHQ - 2 Score 6  Altered sleeping 3  Tired, decreased energy 3  Change in appetite 2  Feeling bad or failure about yourself  3  Trouble concentrating 3  Moving slowly or fidgety/restless 3  Suicidal thoughts 3  PHQ-9 Score 26    Patient denies SI currently, she is established with a psychiatrist  {Show previous vital signs (optional):23777}    ASSESSMENT/PLAN:   Viral gastroenteritis  Acute onset of vomiting associated with upper respiratory symptoms cough and congestion, likely viral cause.  Abdomen diffusely  tender, doubt any acute abdominal causes, likely related to vomiting gastroenteritis.  Will treat symptomatically. - promethazine prn (ondansetron not covered by pt insurance) - return precautions given, hydration  Return if symptoms worsen or fail to improve.   Zola Button, MD [place]

## 2022-06-19 ENCOUNTER — Other Ambulatory Visit (HOSPITAL_COMMUNITY): Payer: Self-pay | Admitting: Psychiatry

## 2022-06-19 DIAGNOSIS — G4700 Insomnia, unspecified: Secondary | ICD-10-CM

## 2022-06-19 DIAGNOSIS — F3160 Bipolar disorder, current episode mixed, unspecified: Secondary | ICD-10-CM

## 2022-06-21 ENCOUNTER — Other Ambulatory Visit: Payer: Self-pay | Admitting: Family Medicine

## 2022-06-27 ENCOUNTER — Ambulatory Visit (INDEPENDENT_AMBULATORY_CARE_PROVIDER_SITE_OTHER): Payer: Medicaid Other | Admitting: Clinical

## 2022-06-27 ENCOUNTER — Telehealth (HOSPITAL_BASED_OUTPATIENT_CLINIC_OR_DEPARTMENT_OTHER): Payer: Medicaid Other | Admitting: Psychiatry

## 2022-06-27 ENCOUNTER — Encounter (HOSPITAL_COMMUNITY): Payer: Self-pay | Admitting: Clinical

## 2022-06-27 ENCOUNTER — Encounter (HOSPITAL_COMMUNITY): Payer: Self-pay | Admitting: Psychiatry

## 2022-06-27 ENCOUNTER — Encounter (HOSPITAL_COMMUNITY): Payer: Self-pay

## 2022-06-27 VITALS — Wt 140.0 lb

## 2022-06-27 DIAGNOSIS — F410 Panic disorder [episodic paroxysmal anxiety] without agoraphobia: Secondary | ICD-10-CM | POA: Diagnosis not present

## 2022-06-27 DIAGNOSIS — F4310 Post-traumatic stress disorder, unspecified: Secondary | ICD-10-CM | POA: Diagnosis not present

## 2022-06-27 DIAGNOSIS — F411 Generalized anxiety disorder: Secondary | ICD-10-CM | POA: Diagnosis not present

## 2022-06-27 DIAGNOSIS — F3160 Bipolar disorder, current episode mixed, unspecified: Secondary | ICD-10-CM

## 2022-06-27 DIAGNOSIS — F419 Anxiety disorder, unspecified: Secondary | ICD-10-CM | POA: Diagnosis not present

## 2022-06-27 DIAGNOSIS — G4700 Insomnia, unspecified: Secondary | ICD-10-CM | POA: Diagnosis not present

## 2022-06-27 DIAGNOSIS — F319 Bipolar disorder, unspecified: Secondary | ICD-10-CM

## 2022-06-27 MED ORDER — TRAZODONE HCL 100 MG PO TABS: 100.0000 mg | ORAL_TABLET | Freq: Every day | ORAL | 1 refills | Status: DC

## 2022-06-27 MED ORDER — LITHIUM CARBONATE ER 300 MG PO TBCR: 300.0000 mg | EXTENDED_RELEASE_TABLET | ORAL | 1 refills | Status: DC

## 2022-06-27 MED ORDER — ARIPIPRAZOLE 20 MG PO TABS: 20.0000 mg | ORAL_TABLET | Freq: Every day | ORAL | 1 refills | Status: DC

## 2022-06-27 NOTE — Progress Notes (Signed)
[facility] MD Virtual Progress Note   Patient Location: Home Provider Location: Home Office  I connect with patient by video and verified that I am speaking with correct person by using two identifiers. I discussed the limitations of evaluation and management by telemedicine and the availability of in person appointments. I also discussed with the patient that there may be a patient responsible charge related to this service. The patient expressed understanding and agreed to proceed.  Bethany [name] [id number] 47 y.o.  06/27/2022 11:17 AM  History of Present Illness:  Patient is evaluated by video session.  On the last visit we started trazodone to help her sleep.  She was able to get into sleep but not able to stay in the sleep.  She reported Abilify helped the paranoia and hallucinations but during the day she still struggle with the symptoms and chronic and passive suicidal thoughts but no plan or any intent.  She sees babies and demons but Abilify helped the intensity.  Since her disability denied now she is going to appeal through the help of attorney.  She is applying on the basis of mental disorder since previous disability was applied through the physical disability which was denied.  She started therapy with Bethany [name].  She has mild tremors and shakes.  She has chronic back pain and requires a walker to help her balance.  Her parents help her to take her to the doctor's appointment and she cannot drive.  Patient has not scheduled appointment neurology but like to get appointment sooner.  She admitted irritability, frustration, anger, mood swings but no active suicidal thoughts or aggressive thoughts.  She is taking hydroxyzine 50 mg 3 times a day prescribed by PCP.  Her appetite is okay.  She gained a few pounds due to lack of mobility.  Past Psychiatric History: Saw therapist in [facility] after brother killed.  Took Zoloft and lorazepam by PCP.  She took Lamictal  but stopped after the rash.  Trazodone helped but wanted to try something for anxiety and it was switched to hydroxyzine.  No h/o inpatient treatment, suicidal attempt, psychosis or any mania.  H/O irritability, anger and mood swing.  Tried Lamictal but stopped due to rash.    Outpatient Encounter Medications as of 06/27/2022  Medication Sig   acetaminophen (TYLENOL) 650 MG CR tablet Take 1,300 mg by mouth every 8 (eight) hours. Takes it 2 to 3 times per day   ARIPiprazole (ABILIFY) 20 MG tablet Take 1 tablet (20 mg total) by mouth daily.   diclofenac Sodium (VOLTAREN) 1 % GEL APPLY 4 G TOPICALLY 4 TIMES DAILY   hydrOXYzine (ATARAX) 50 MG tablet TAKE 1 TABLET BY MOUTH AT BEDTIME AS NEEDED. CAN TAKE UP TO 3 TIMES DAILY.   linaclotide (LINZESS) 145 MCG CAPS capsule Take 1 capsule (145 mcg total) by mouth daily before breakfast.   promethazine (PHENERGAN) 12.5 MG tablet TAKE 1 TABLET BY MOUTH EVERY 6 HOURS AS NEEDED FOR NAUSEA OR VOMITING.   tiZANidine (ZANAFLEX) 2 MG tablet TAKE 1 TO 2 TABLETS (2-4 MG TOTAL) BY MOUTH EVERY 6 HOURS AS NEEDED (Patient taking differently: Takes 4 mg q6h)   traZODone (DESYREL) 50 MG tablet Take 1 tablet (50 mg total) by mouth at bedtime.   No facility-administered encounter medications on file as of 06/27/2022.    Recent Results (from the past 2160 hour(s))  POCT Urine Drug Screen - (I-Screen)     Status: Normal   Collection Time: 04/19/22  12:13 PM  Result Value Ref Range   POC Amphetamine UR None Detected NONE DETECTED (Cut Off Level 1000 ng/mL)   POC Secobarbital (BAR) None Detected NONE DETECTED (Cut Off Level 300 ng/mL)   POC Buprenorphine (BUP) None Detected NONE DETECTED (Cut Off Level 10 ng/mL)   POC Oxazepam (BZO) None Detected NONE DETECTED (Cut Off Level 300 ng/mL)   POC Cocaine UR None Detected NONE DETECTED (Cut Off Level 300 ng/mL)   POC Methamphetamine UR None Detected NONE DETECTED (Cut Off Level 1000 ng/mL)   POC Morphine None Detected NONE DETECTED  (Cut Off Level 300 ng/mL)   POC Methadone UR None Detected NONE DETECTED (Cut Off Level 300 ng/mL)   POC Oxycodone UR None Detected NONE DETECTED (Cut Off Level 100 ng/mL)   POC Marijuana UR None Detected NONE DETECTED (Cut Off Level 50 ng/mL)  Resp panel by RT-PCR (RSV, Flu A&B, Covid) Anterior Nasal Swab     Status: None   Collection Time: 04/19/22 12:15 PM   Specimen: Anterior Nasal Swab  Result Value Ref Range   SARS Coronavirus 2 by RT PCR NEGATIVE NEGATIVE    Comment: (NOTE) SARS-CoV-2 target nucleic acids are NOT DETECTED.  The SARS-CoV-2 RNA is generally detectable in upper respiratory specimens during the acute phase of infection. The lowest concentration of SARS-CoV-2 viral copies this assay can detect is 138 copies/mL. A negative result does not preclude SARS-Cov-2 infection and should not be used as the sole basis for treatment or other patient management decisions. A negative result may occur with  improper specimen collection/handling, submission of specimen other than nasopharyngeal swab, presence of viral mutation(s) within the areas targeted by this assay, and inadequate number of viral copies(<138 copies/mL). A negative result must be combined with clinical observations, patient history, and epidemiological information. The expected result is Negative.  Fact Sheet for Patients:  EntrepreneurPulse.com.au  Fact Sheet for Healthcare Providers:  IncredibleEmployment.be  This test is no t yet approved or cleared by the [place] FDA and  has been authorized for detection and/or diagnosis of SARS-CoV-2 by FDA under an Emergency Use Authorization (EUA). This EUA will remain  in effect (meaning this test can be used) for the duration of the COVID-19 declaration under Section 564(b)(1) of the Act, 21 U.S.C.section 360bbb-3(b)(1), unless the authorization is terminated  or revoked sooner.       Influenza A by PCR NEGATIVE  NEGATIVE   Influenza B by PCR NEGATIVE NEGATIVE    Comment: (NOTE) The Xpert Xpress SARS-CoV-2/FLU/RSV plus assay is intended as an aid in the diagnosis of influenza from Nasopharyngeal swab specimens and should not be used as a sole basis for treatment. Nasal washings and aspirates are unacceptable for Xpert Xpress SARS-CoV-2/FLU/RSV testing.  Fact Sheet for Patients: EntrepreneurPulse.com.au  Fact Sheet for Healthcare Providers: IncredibleEmployment.be  This test is not yet approved or cleared by the [place] FDA and has been authorized for detection and/or diagnosis of SARS-CoV-2 by FDA under an Emergency Use Authorization (EUA). This EUA will remain in effect (meaning this test can be used) for the duration of the COVID-19 declaration under Section 564(b)(1) of the Act, 21 U.S.C. section 360bbb-3(b)(1), unless the authorization is terminated or revoked.     Resp Syncytial Virus by PCR NEGATIVE NEGATIVE    Comment: (NOTE) Fact Sheet for Patients: EntrepreneurPulse.com.au  Fact Sheet for Healthcare Providers: IncredibleEmployment.be  This test is not yet approved or cleared by the [place] FDA and has been authorized for detection and/or diagnosis  of SARS-CoV-2 by FDA under an Emergency Use Authorization (EUA). This EUA will remain in effect (meaning this test can be used) for the duration of the COVID-19 declaration under Section 564(b)(1) of the Act, 21 U.S.C. section 360bbb-3(b)(1), unless the authorization is terminated or revoked.  Performed at [facility], [place] [address] [place], [place] [address]   POC SARS Coronavirus 2 Ag     Status: None   Collection Time: 04/19/22 12:33 PM  Result Value Ref Range   SARSCOV2ONAVIRUS 2 AG NEGATIVE NEGATIVE    Comment: (NOTE) SARS-CoV-2 antigen NOT DETECTED.   Negative results are presumptive.  Negative results do not  preclude SARS-CoV-2 infection and should not be used as the sole basis for treatment or other patient management decisions, including infection  control decisions, particularly in the presence of clinical signs and  symptoms consistent with COVID-19, or in those who have been in contact with the virus.  Negative results must be combined with clinical observations, patient history, and epidemiological information. The expected result is Negative.  Fact Sheet for Patients: HandmadeRecipes.com.cy  Fact Sheet for Healthcare Providers: FuneralLife.at  This test is not yet approved or cleared by the [place] FDA and  has been authorized for detection and/or diagnosis of SARS-CoV-2 by FDA under an Emergency Use Authorization (EUA).  This EUA will remain in effect (meaning this test can be used) for the duration of  the COV ID-19 declaration under Section 564(b)(1) of the Act, 21 U.S.C. section 360bbb-3(b)(1), unless the authorization is terminated or revoked sooner.       Psychiatric Specialty Exam: Physical Exam  Review of Systems  Musculoskeletal:  Positive for back pain.       Balance issue, need walker  Neurological:  Positive for dizziness and tremors.    Weight 140 lb (63.5 kg).There is no height or weight on file to calculate BMI.  General Appearance: Casual  Eye Contact:  Good  Speech:  Normal Rate  Volume:  Decreased  Mood:  Anxious and Dysphoric  Affect:  Congruent  Thought Process:  Descriptions of Associations: Intact  Orientation:  Full (Time, Place, and Person)  Thought Content:  Hallucinations: Visual Seeing baby sitting on doors and Paranoid Ideation  Suicidal Thoughts:   passive thoughts but no plan  Homicidal Thoughts:  No  Memory:  Immediate;   Fair Recent;   Fair Remote;   Fair  Judgement:  Fair  Insight:  Present  Psychomotor Activity:  Decreased and Tremor  Concentration:  Concentration: Fair and  Attention Span: Fair  Recall:  [employer] of Knowledge:  Fair  Language:  Good  Akathisia:  No  Handed:  Right  AIMS (if indicated):     Assets:  Communication Skills Desire for Improvement Housing  ADL's:  Intact  Cognition:  Impaired,  Mild  Sleep:  fair     Assessment/Plan: Mixed bipolar I disorder ([place]) - Plan: traZODone (DESYREL) 100 MG tablet, lithium carbonate (LITHOBID) 300 MG ER tablet  Anxiety - Plan: ARIPiprazole (ABILIFY) 20 MG tablet  Insomnia, unspecified type - Plan: ARIPiprazole (ABILIFY) 20 MG tablet, traZODone (DESYREL) 100 MG tablet  Patient is still have residual symptoms of mood lability, insomnia and anxiety.  She like to optimize further Abilify but she started gaining weight and I will defer that.  She is struggling with anxiety, insomnia and passive and fleeting suicidal thoughts.  She also having poor attention concentration and sometimes requires reminder from her parents to do work.  I  encourage try to schedule appointment with a neurology to address these issues.  We will increase trazodone 100 mg at bedtime, she is also taking hydroxyzine 50 mg 3 times a day prescribed by PCP and I discussed combination of the medication may cause or contribute memory issues.  Patient at this time like to focus on her mental health.  She like something to add during the day to help her hallucinations.  We will add low-dose lithium to help her mood lability, chronic suicidal thoughts.  Encourage appointment with therapist.  For now continue Abilify 20 mg daily, increase trazodone 100 mg at bedtime and start lithium 300 mg daily.  Follow-up in 6 weeks.  Discussed medication side effects and benefits and recommend to call us back if she noticed worsening of symptoms.   Follow Up Instructions:     I discussed the assessment and treatment plan with the patient. The patient was provided an opportunity to ask questions and all were answered. The patient agreed with the plan and  demonstrated an understanding of the instructions.   The patient was advised to call back or seek an in-person evaluation if the symptoms worsen or if the condition fails to improve as anticipated.    Collaboration of Care: Other provider involved in patient's care AEB notes are available in epic to review.  Patient/Guardian was advised Release of Information must be obtained prior to any record release in order to collaborate their care with an outside provider. Patient/Guardian was advised if they have not already done so to contact the registration department to sign all necessary forms in order for [name] to release information regarding their care.   Consent: Patient/Guardian gives verbal consent for treatment and assignment of benefits for services provided during this visit. Patient/Guardian expressed understanding and agreed to proceed.     I provided 24 minutes of non face to face time during this encounter.  [name], MD 06/27/2022
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
## 2022-07-04 ENCOUNTER — Ambulatory Visit (INDEPENDENT_AMBULATORY_CARE_PROVIDER_SITE_OTHER): Payer: Medicaid Other | Admitting: Family Medicine

## 2022-07-04 ENCOUNTER — Telehealth: Payer: Self-pay

## 2022-07-04 ENCOUNTER — Ambulatory Visit
Admission: RE | Admit: 2022-07-04 | Discharge: 2022-07-04 | Disposition: A | Discharge status: Home / Self Care | Payer: Medicaid Other | Source: Ambulatory Visit | Attending: Family Medicine | Admitting: Family Medicine

## 2022-07-04 ENCOUNTER — Encounter: Payer: Self-pay | Admitting: Family Medicine

## 2022-07-04 VITALS — BP 106/72 | HR 75 | Ht 64.0 in | Wt 141.2 lb

## 2022-07-04 DIAGNOSIS — R7309 Other abnormal glucose: Secondary | ICD-10-CM

## 2022-07-04 DIAGNOSIS — M545 Low back pain, unspecified: Secondary | ICD-10-CM

## 2022-07-04 DIAGNOSIS — Z0000 Encounter for general adult medical examination without abnormal findings: Secondary | ICD-10-CM | POA: Diagnosis not present

## 2022-07-04 DIAGNOSIS — N644 Mastodynia: Secondary | ICD-10-CM

## 2022-07-04 DIAGNOSIS — F319 Bipolar disorder, unspecified: Secondary | ICD-10-CM | POA: Diagnosis not present

## 2022-07-04 DIAGNOSIS — K5900 Constipation, unspecified: Secondary | ICD-10-CM | POA: Diagnosis not present

## 2022-07-04 DIAGNOSIS — M7500 Adhesive capsulitis of unspecified shoulder: Secondary | ICD-10-CM

## 2022-07-04 LAB — POCT GLYCOSYLATED HEMOGLOBIN (HGB A1C): Hemoglobin A1C: 5.8 % — AB (ref 4.0–5.6)

## 2022-07-04 MED ORDER — KETOROLAC TROMETHAMINE 30 MG/ML IJ SOLN
30.0000 mg | Freq: Once | INTRAMUSCULAR | Status: AC
  Administered 2022-07-04: 30 mg via INTRAMUSCULAR

## 2022-07-04 MED ORDER — LINACLOTIDE 145 MCG PO CAPS: 145.0000 ug | ORAL_CAPSULE | Freq: Every day | ORAL | 0 refills | Status: DC

## 2022-07-04 NOTE — Assessment & Plan Note (Addendum)
History and exam c/w adhesive capsulitis. Marked pain to the touch of L shoulder which is atypical, however. Given chronicity, will obtain L shoulder XR. Because she has had improvement with steroid injection in the past, will refer to sports medicine. Will also refer to PT.
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
## 2022-07-04 NOTE — Assessment & Plan Note (Signed)
Pain has been persistent. [name] and diagnostic mammogram with breast density category C that could obscure masses. Given continued pain and patient's hesitation with family history in maternal aunt, will refer to [facility] for further evaluation. Toradol shot should help with pain at this time.

## 2022-07-04 NOTE — Patient Instructions (Addendum)
It was great to see you today! Here's what we talked about:  I will refer you to sports medicine to get your shoulder injection. Continue to follow with your psychiatrist and therapist. I will refer you to the surgeons at [facility] for further evaluation. We will get you a toradol injection today to help with your shoulder and back pain. For your back pain, continue lidocaine patches and voltaren gel. Your glucose was slightly elevated. Continue healthy eating and exercise habits as attached.  Please let me know if you have any other questions.  Dr. Marcha Dutton

## 2022-07-04 NOTE — Assessment & Plan Note (Signed)
>>  ASSESSMENT AND PLAN FOR BREAST PAIN, RIGHT WRITTEN ON 07/04/2022 10:40 AM BY Satoshi Kalas, MD  Pain has been persistent. US  and diagnostic mammogram with breast density category C that could obscure masses. Given continued pain and patient's hesitation with family history in maternal aunt, will refer to [facility] for further evaluation. Toradol  shot should help with pain at this time.

## 2022-07-04 NOTE — Assessment & Plan Note (Signed)
Persistent though controlled with Q12 lidocaine patches and voltaren gel. Toradol has helped in the past. Will give IM 30 mg injection today for pain relief. PT referral also placed.

## 2022-07-04 NOTE — Assessment & Plan Note (Signed)
" >>  ASSESSMENT AND PLAN FOR BREAST DISEASE WRITTEN ON 09/08/2023  1:03 PM BY Falon Huesca, MD   >>ASSESSMENT AND PLAN FOR BREAST PAIN, RIGHT WRITTEN ON 07/04/2022 10:40 AM BY Raed Schalk, MD  Pain has been persistent. US  and diagnostic mammogram with breast density category C that could obscure masses. Given continued pain and patient's hesitation with family history in maternal aunt, will refer to [facility] for further evaluation. Toradol  shot should help with pain at this time. "

## 2022-07-04 NOTE — Progress Notes (Signed)
SUBJECTIVE:   Chief compliant/HPI: annual examination  Bethany [name] is a 47 y.o. who presents today for an annual exam.   Bipolar I disorder Has been on lithium, abilify, hydroxyzine, and trazodone. Follows with psychiatry.  Adhesive capsulitis Shoulder has been hurting again recently. She is not able to lift her shoulder up most of the time. It is painful to the touch. It is interfering greatly with her daily life. She has had an injection before in 2020 which did help, and she would like another injection for pain relief.  Lumbar spine pain, chronic Had toradol injection in 2023 that did help a lot. Has been using lidocaine patches every 12 hours with voltaren gel, and this helps usually until the next dose is needed. Has not seen Ortho for RFA due to insurance change, but she would like to go and see someone who has medicaid.  Breast pain Ongoing right sided breast pain. Prior US/mammogram was negative for malignancy but did show breast density category C which could obscure masses. She would like follow up given her family history of breast cancer in her maternal aunt.  OBJECTIVE:   BP 106/72   Pulse 75   Ht '5\' 4"'$  (1.626 m)   Wt 141 lb 4 oz (64.1 kg)   SpO2 100%   BMI 24.25 kg/m   General: Alert and oriented, in NAD Skin: Warm, dry, and intact without lesions HEENT: NCAT, EOM grossly normal, midline nasal septum Cardiac: RRR, no m/r/g appreciated Respiratory: CTAB, breathing and speaking comfortably on RA Neurological/MSK: No gross focal deficit, tenderness to very light palpation diffusely along lower back and of L shoulder, marked limited active and passive ROM of L shoulder without raising above 30 degrees, internal and external rotation produces pain, able to resist downward force on empty can test with pain reproduction Psychiatric: Flat affect, PHQ-9 not obtained as she declined per staff  ASSESSMENT/PLAN:   Adhesive capsulitis History and exam c/w adhesive  capsulitis. Marked pain to the touch of L shoulder which is atypical, however. Given chronicity, will obtain L shoulder XR. Because she has had improvement with steroid injection in the past, will refer to sports medicine. Will also refer to PT.  Breast pain, right Pain has been persistent. [name] and diagnostic mammogram with breast density category C that could obscure masses. Given continued pain and patient's hesitation with family history in maternal aunt, will refer to [facility] for further evaluation. Toradol shot should help with pain at this time.  Constipation Continued problem. Endorses relief with linzess. Will refill.  Lumbar spine pain Persistent though controlled with Q12 lidocaine patches and voltaren gel. Toradol has helped in the past. Will give IM 30 mg injection today for pain relief. PT referral also placed.   Annual Examination  Blood pressure reviewed and at goal.  IPV screened The patient had tubal ligation in 2003 and is at lower risk for pregnancy.  Considered the following items based upon USPSTF recommendations: Diabetes screening:  5.8 today, prediabetic range. Discussed healthy eating and exercise. Handouts provided. Screening for elevated cholesterol:  elevated in 06/2021. Will need update at next visit. HIV testing:  NR in 12/2021 Hepatitis C:  Negative in 09/2007 Hepatitis B:  Negative in 09/2007 Syphilis if at high risk:  NR in 12/2021 GC/CT  Negative in 12/2021 Reviewed risk factors for latent tuberculosis and not indicated Reviewed risk factors for osteoporosis. Using FRAX tool estimated risk of major osteoporotic fracture of  3.2%,  early screening not ordered  Cervical cancer screening:  up to date on screening with last pap NILM and negative HPV, due in 2027 Breast cancer screening:  she is s/p bilateral mastectomy after mass in breast (pathology report negative for malignancy) Colorectal cancer screening: up to date on screening for  CRC. Last study in June 2023; however, we do not have records. Discussed obtaining and bringing to office.  Follow up in 1 year or sooner if indicated.   [name], MD [place]

## 2022-07-04 NOTE — Assessment & Plan Note (Signed)
Continued problem. Endorses relief with linzess. Will refill.

## 2022-07-04 NOTE — Telephone Encounter (Signed)
Spoke with patient. Informed her of her X-Ray at [facility]. Patient understood. Salvatore Marvel, CMA

## 2022-07-07 ENCOUNTER — Encounter: Payer: Self-pay | Admitting: Family Medicine

## 2022-07-07 DIAGNOSIS — M545 Low back pain, unspecified: Secondary | ICD-10-CM

## 2022-07-07 DIAGNOSIS — M7500 Adhesive capsulitis of unspecified shoulder: Secondary | ICD-10-CM

## 2022-07-07 DIAGNOSIS — Z748 Other problems related to care provider dependency: Secondary | ICD-10-CM

## 2022-07-08 ENCOUNTER — Telehealth (HOSPITAL_COMMUNITY): Payer: Medicaid Other | Admitting: Psychiatry

## 2022-07-08 ENCOUNTER — Encounter: Payer: Self-pay | Admitting: Family Medicine

## 2022-07-09 ENCOUNTER — Other Ambulatory Visit: Payer: Medicaid Other
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
## 2022-07-09 NOTE — Patient Instructions (Signed)
Visit Information  Ms. Bethany [name] was given information about Medicaid Managed Care team care coordination services as a part of their Healthy [facility] Medicaid benefit. Bethany [name] verbally consented to engagement with the [facility] Managed Care team.   If you are experiencing a medical emergency, please call 911 or report to your local emergency department or urgent care.   If you have a non-emergency medical problem during routine business hours, please contact your provider's office and ask to speak with a nurse.   For questions related to your Healthy [facility] health plan, please call: [phone] or visit the homepage here: [email or web address]  If you would like to schedule transportation through your Healthy [facility] plan, please call the following number at least 2 days in advance of your appointment: [phone]  For information about your ride after you set it up, call Ride Assist at [phone]. Use this number to activate a Will Call pickup, or if your transportation is late for a scheduled pickup. Use this number, too, if you need to make a change or cancel a previously scheduled reservation.  If you need transportation services right away, call [phone]. The after-hours call center is staffed 24 hours to handle ride assistance and urgent reservation requests (including discharges) 365 days a year. Urgent trips include sick visits, hospital discharge requests and life-sustaining treatment.  Call the [place] at [phone], at any time, 24 hours a day, 7 days a week. If you are in danger or need immediate medical attention call 911.  If you would like help to quit smoking, call 1-800-QUIT-NOW 804-082-8360) OR Espaol: 1-855-Djelo-Ya QO:409462) o para ms informacin haga clic aqu or Text READY to 200-400 to register via text  Bethany [name] - following are the goals we discussed in your visit today:   Goals  Addressed   None      Social Worker will follow up with patient on 08/09/22.   Mickel Fuchs, BSW, [place] Managed Medicaid Team  [phone]   Following is a copy of your plan of care:  There are no care plans that you recently modified to display for this patient.

## 2022-07-10 ENCOUNTER — Ambulatory Visit: Payer: Medicaid Other | Admitting: Family Medicine

## 2022-07-10 ENCOUNTER — Other Ambulatory Visit: Payer: Self-pay

## 2022-07-10 VITALS — BP 96/60 | Ht 64.0 in | Wt 141.0 lb

## 2022-07-10 DIAGNOSIS — M25512 Pain in left shoulder: Secondary | ICD-10-CM

## 2022-07-10 DIAGNOSIS — M7502 Adhesive capsulitis of left shoulder: Secondary | ICD-10-CM

## 2022-07-10 MED ORDER — METHYLPREDNISOLONE ACETATE 40 MG/ML IJ SUSP
40.0000 mg | Freq: Once | INTRAMUSCULAR | Status: AC
  Administered 2022-07-10: 40 mg via INTRA_ARTICULAR

## 2022-07-10 NOTE — Progress Notes (Unsigned)
    SUBJECTIVE:   CHIEF COMPLAINT / HPI:   Feeling pain in the front of her left shoulder. Has been taking XS tylenol and that has not helped. Tizanidine does not help at all. 1 month of pain. Has had shots in the left shoulder before for similar pain when diagnosed with frozen shoulder and this feels similar. No sports/throwing actions that she does. No hit/trauma to arm. Does have shooting pain down left side from time to time. Does have neck pain on the left side as well. No numbness or tingling  PERTINENT  PMH / PSH: prediabetes  OBJECTIVE:   BP 96/60   Ht 5\' 4"  (1.626 m)   Wt 141 lb (64 kg)   BMI 24.20 kg/m   Gen: NAD, awake, alert, responsive to questions Left Shoulder: Inspection reveals no obvious deformity, atrophy, or asymmetry b/l. No bruising. No swelling Palpation is normal with no TTP over [facility] joint or bicipital groove b/l on right but does have TTP over [facility] joint and anterior aspect of shoulder on left. Limited ROM in active flexion (left ~70 degrees, right full), abduction (left around 80 degrees, full on right) , internal (full b/l)/external rotation (45 degrees b/l). Pain with external rotation, abduction, forward flexion on left.  Passive ER on left similar restriction with 60 degrees on right. NV intact distally b/l Normal scapular function observed b/l Special Tests:  - Impingement: + empty can sign on left - Supraspinatous: pos empty can - Infraspinatous/Teres Minor: 4/5 strength with ER b/l - Subscapularis: 4/5 strength b/l with IR Neck: non-tender to palpation in cervical processes; left trapezius mild tenderness to palpation  Limited ultrasound left shoulder:  Supraspinatus, infraspinatus, and subscapularis intact without tears.  ASSESSMENT/PLAN:   Adhesive capsulitis Ultrasound performed by Dr. Barbaraann Barthel overall wnl. Patient's pain and limited ROM most consistent with adhesive capsulitis. Glenohumeral injection given in office by Dr. Barbaraann Barthel for pain control.   -Discussed could do additional steroid injection in 4 weeks if not improved  -Can consider injection with suprascapular nerve block if failing steroid injection -Stretching/ROM exercises for adhesive capsulitis given to patient  After informed written consent timeout was performed, patient was lying on right side on exam table. Left shoulder was prepped with alcohol swab and utilizing ultrasound guidance, patient's left glenohumeral space was injected with 3:1 lidocaine: depomedrol. Patient tolerated the procedure well without immediate complications.   Gerrit Heck, MD [place]

## 2022-07-10 NOTE — Assessment & Plan Note (Signed)
Ultrasound performed by Dr. Barbaraann Barthel overall wnl. Patient's pain and limited ROM most consistent with adhesive capsulitis. Glenohumeral injection given in office by Dr. Barbaraann Barthel for pain control.  -Discussed could do additional steroid injection in 4 weeks if not improved  -Can consider injection with nerve block if failing steroid injection x 3  -Stretching/ROM exercises for adhesive capsulitis given to patient

## 2022-07-10 NOTE — Patient Instructions (Signed)
You have a frozen shoulder (adhesive capsulitis), a buildup of scar tissue that limits motion of the shoulder joint. Limit lifting and overhead activities as much as possible. Heat 15 minutes at a time 3-4 times a day may help with movement and stiffness. Tylenol and/or aleve as needed for pain and inflammation. Steroid injections in a series have been shown to help with pain and motion - you were given one today. Codman exercises (pendulum, wall walking or table slides, arm circles) - do 3 sets of 10 once or twice a day. Physical therapy for rotator cuff strengthening is a consideration once you are out of the painful phase Follow up in 1 month.

## 2022-07-11 ENCOUNTER — Telehealth: Payer: Self-pay | Admitting: Family Medicine
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
## 2022-07-17 ENCOUNTER — Encounter: Payer: Self-pay | Admitting: *Deleted

## 2022-07-19 ENCOUNTER — Other Ambulatory Visit (HOSPITAL_COMMUNITY): Payer: Self-pay | Admitting: Psychiatry

## 2022-07-19 DIAGNOSIS — F3160 Bipolar disorder, current episode mixed, unspecified: Secondary | ICD-10-CM

## 2022-07-19 DIAGNOSIS — G4700 Insomnia, unspecified: Secondary | ICD-10-CM

## 2022-07-25 ENCOUNTER — Encounter: Payer: Self-pay | Admitting: Family Medicine

## 2022-07-29 ENCOUNTER — Other Ambulatory Visit: Payer: Self-pay

## 2022-07-29 ENCOUNTER — Other Ambulatory Visit: Payer: Self-pay | Admitting: Family Medicine

## 2022-07-29 ENCOUNTER — Encounter: Payer: Self-pay | Admitting: Physical Therapy

## 2022-07-29 ENCOUNTER — Other Ambulatory Visit (HOSPITAL_COMMUNITY): Payer: Self-pay | Admitting: Psychiatry

## 2022-07-29 ENCOUNTER — Ambulatory Visit: Payer: Medicaid Other | Attending: Family Medicine | Admitting: Physical Therapy

## 2022-07-29 DIAGNOSIS — M545 Low back pain, unspecified: Secondary | ICD-10-CM | POA: Insufficient documentation

## 2022-07-29 DIAGNOSIS — F3160 Bipolar disorder, current episode mixed, unspecified: Secondary | ICD-10-CM

## 2022-07-29 DIAGNOSIS — R2689 Other abnormalities of gait and mobility: Secondary | ICD-10-CM | POA: Insufficient documentation

## 2022-07-29 DIAGNOSIS — M6281 Muscle weakness (generalized): Secondary | ICD-10-CM | POA: Insufficient documentation

## 2022-07-29 DIAGNOSIS — M25612 Stiffness of left shoulder, not elsewhere classified: Secondary | ICD-10-CM | POA: Insufficient documentation

## 2022-07-29 DIAGNOSIS — M5459 Other low back pain: Secondary | ICD-10-CM | POA: Insufficient documentation

## 2022-07-29 DIAGNOSIS — M7500 Adhesive capsulitis of unspecified shoulder: Secondary | ICD-10-CM | POA: Diagnosis not present

## 2022-07-29 DIAGNOSIS — M25512 Pain in left shoulder: Secondary | ICD-10-CM | POA: Diagnosis present

## 2022-07-29 DIAGNOSIS — G4700 Insomnia, unspecified: Secondary | ICD-10-CM
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
## 2022-07-31 ENCOUNTER — Other Ambulatory Visit (HOSPITAL_COMMUNITY): Payer: Self-pay | Admitting: Psychiatry

## 2022-07-31 DIAGNOSIS — G4700 Insomnia, unspecified: Secondary | ICD-10-CM

## 2022-07-31 DIAGNOSIS — F419 Anxiety disorder, unspecified: Secondary | ICD-10-CM

## 2022-08-01 ENCOUNTER — Encounter (HOSPITAL_COMMUNITY): Payer: Self-pay | Admitting: Clinical

## 2022-08-01 ENCOUNTER — Ambulatory Visit (INDEPENDENT_AMBULATORY_CARE_PROVIDER_SITE_OTHER): Payer: Medicaid Other | Admitting: Clinical

## 2022-08-01 DIAGNOSIS — F411 Generalized anxiety disorder: Secondary | ICD-10-CM

## 2022-08-01 DIAGNOSIS — F410 Panic disorder [episodic paroxysmal anxiety] without agoraphobia: Secondary | ICD-10-CM | POA: Diagnosis not present

## 2022-08-01 DIAGNOSIS — F319 Bipolar disorder, unspecified: Secondary | ICD-10-CM | POA: Diagnosis not present

## 2022-08-01 DIAGNOSIS — F4310 Post-traumatic stress disorder, unspecified: Secondary | ICD-10-CM | POA: Diagnosis not present

## 2022-08-01 NOTE — Progress Notes (Signed)
THERAPIST PROGRESS NOTE  Session Time: 8:00-9:00am  Session #2  Virtual Visit via Video Note  I connected with Bethany HurterMonica S Waltz on 08/01/22 at  8:00 AM EDT by a video enabled telemedicine application and verified that I am speaking with the correct person using two identifiers.  Location: Patient: [place] [facility]   I discussed the limitations of evaluation and management by telemedicine and the availability of in person appointments. The patient expressed understanding and agreed to proceed.  I discussed the assessment and treatment plan with the patient. The patient was provided an opportunity to ask questions and all were answered. The patient agreed with the plan and demonstrated an understanding of the instructions.   The patient was advised to call back or seek an in-person evaluation if the symptoms worsen or if the condition fails to improve as anticipated.  I provided 60 minutes of non-face-to-face time during this encounter.  Lynnell ChadMareida J Grossman-Orr, LCSW     Participation Level: Active  Behavioral Response: Casual and Well Groomed Alert Anxious and Depressed  Type of Therapy: Individual Therapy  Treatment Goals addressed:  LTG: Increase ability to regulate mood as evidenced by fewer highs and fewer lows that impact [place]'s life  STG: Be free of suicidal thoughts; call crisis hotline if having suicidal thoughts   LTG: Explore and resolve issues related to the various traumatic events in her life  STG: Learn about typical long term/residual effects of traumatic life experiences LTG: Improve ability to see world as others do, as well as ability to accept that they do not see the world as she does.  STG: Be able to use reality testing on her auditory/visual hallucinations and paranoid beliefs   ProgressTowards Goals: Progressing  Interventions: CBT and Other: Psychoeducation about mental illness  Summary: Bethany [name] is a 47  y.o. female who presents with Generalized Anxiety Disorder with panic attacks, Bipolar Disorder, and Post-Traumatic Stress Disorder.  She stated that she has been working to quiet her voices.  Sleep continues to be very difficult, as she can go to sleep with the Trazodone 100 mg dosage but she cannot stay asleep more than 4 hours at most, usually just 3 hours.  She has anxiety attacks while she is sleeping that are related to the voices she hears at night and this is often what wakes her up.  Fear of having panic attacks in her sleep also interferes with her ability to go to sleep.  Typically, she goes to bed around 8:30-9:00pm and is awake again by midnight.  She will stay in bed until the next morning but does mundane uninteresting things rather than watch TV, does not want to do anything that would excite her brain.  She listens to a lot of relaxing music.  She is eating well and drinking fluids.  One thing she stated that she does to take care of herself is to tell people she is interacting with that she has not slept well as a way of explaining when her speech is slow or she is jumpy.  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  Anything more than 5 people feels like a crowd to her.  To try to force her out of her comfort zone, her daughter has asked her to be in charge of games for an upcoming gender reveal party  on 4/20.  We discussed the challenges in this including projecting her voice to 60 participants, and she decided how she will handle this including requesting that her daughter provide a microphone.   CSW gave an explanation of Cognitive Behavioral Therapy and its basis that situations do not determine feelings, but rather our thoughts about the situations lead to our feelings.  An explanation was given about how we have to challenge our thoughts to determine whether they are  valid.  CSW shared screen with 11 Thinking Traps listed, and patient took a screen shot so she can look at them throughout the coming days, which she was asked to do.  We talked about the first 3 in details and she agreed that she does these things quite a bit, especially "Thinking the Worst."  She stated she does not know why she thinks the way she does, feels how she does, etc.  CSW told her we would shelve the cognitive distortions review for now and talk about what contributes to our mental state.  CSW explained the 4 factors contributing to mental illness including genetics, brain chemistry, life events, and personality.  We discussed how we may be able to impact these, if at all.  The patient expressed relief that this really demonstrates she is not merely choosing to feel the way she does, "depressed and anxious."  Her biological father has depression and anxiety and is on Lorazepam for it.  We also reviewed Thought Stopping as she is already engaging with her voices telling them out loud to "shut up" at various times throughout the day.  She stated the song "[place] [employer] [place]" is what she will listen to in order to replace the thoughts she is stopping.  She was grateful for the session and stated she will be implementing several of the skills throughout the days to come.      Suicidal/Homicidal: No  Therapist Response: Patient is making progress AEB her ability to engage in a trusting discussion about her symptoms.  Because of her mental health she is not currently able to work and relies on her parents.  She does not like asking for money, but needs her cell phone bill paid, for instance.  She is afraid to ask for this, stating that she will start thinking that if she asks for help like that, they are going to reject her.  We talked through how to challenge that thought with "putting thoughts on trial" technique.  CSW provided mood monitoring and treatment progress review in the context of this  episode of treatment.   Patient reported that her mood has been "fair".   Patient was able to explore how her thinking impacts her feelings, including feelings of anxiety.  CSW gave patient the opportunity to explore thoughts and feelings associated with current life situations and past/present external stressors as desired.   CSW encouraged patient's expression of feelings and validated patient's thoughts, using empathy, active listening, open body language, and unconditional positive regard.  Patient demonstrated an orientation to time, place, person and situation.    Recommendations:  Return to therapy in 2 weeks, engage in self care behaviors, implement 1 new coping skill as taught in session (Thought Stopping), and return to next session prepared to talk about experience with that new coping method, consider Thinking Traps (worksheet provided)  Plan: Return again in 2 weeks.  Diagnosis: Bipolar disorder with psychotic features  Generalized anxiety disorder with panic attacks  Post-traumatic stress disorder, unspecified  Collaboration of Care: Psychiatrist AEB - doctor can read therapy notes  Patient/Guardian was advised Release of Information must be obtained prior to any record release in order to collaborate their care with an outside provider. Patient/Guardian was advised if they have not already done so to contact the registration department to sign all necessary forms in order for [name] to release information regarding their care.   Consent: Patient/Guardian gives verbal consent for treatment and assignment of benefits for services provided during this visit. Patient/Guardian expressed understanding and agreed to proceed.   Lynnell Chad, LCSW 08/01/2022

## 2022-08-08 ENCOUNTER — Encounter (HOSPITAL_COMMUNITY): Payer: Self-pay | Admitting: Psychiatry

## 2022-08-08 ENCOUNTER — Telehealth (HOSPITAL_BASED_OUTPATIENT_CLINIC_OR_DEPARTMENT_OTHER): Payer: Medicaid Other | Admitting: Psychiatry

## 2022-08-08 VITALS — Wt 141.0 lb

## 2022-08-08 DIAGNOSIS — G4700 Insomnia, unspecified: Secondary | ICD-10-CM

## 2022-08-08 DIAGNOSIS — F3160 Bipolar disorder, current episode mixed, unspecified: Secondary | ICD-10-CM | POA: Diagnosis not present

## 2022-08-08 DIAGNOSIS — F419 Anxiety disorder, unspecified: Secondary | ICD-10-CM

## 2022-08-08 MED ORDER — TRAZODONE HCL 100 MG PO TABS
100.0000 mg | ORAL_TABLET | Freq: Every day | ORAL | 0 refills | Status: DC
Start: 2022-08-08 — End: 2022-10-10

## 2022-08-08 MED ORDER — LITHIUM CARBONATE ER 300 MG PO TBCR: 300.0000 mg | EXTENDED_RELEASE_TABLET | Freq: Two times a day (BID) | ORAL | 0 refills | Status: DC

## 2022-08-08 MED ORDER — QUETIAPINE FUMARATE 100 MG PO TABS
50.0000 mg | ORAL_TABLET | Freq: Every day | ORAL | 0 refills | Status: DC
Start: 2022-08-08 — End: 2022-08-30

## 2022-08-08 NOTE — Progress Notes (Signed)
[facility] MD Virtual Progress Note   Patient Location: Home Provider Location: Office  I connect with patient by video and verified that I am speaking with correct person by using two identifiers. I discussed the limitations of evaluation and management by telemedicine and the availability of in person appointments. I also discussed with the patient that there may be a patient responsible charge related to this service. The patient expressed understanding and agreed to proceed.  Bethany [name] [id number] 47 y.o.  08/08/2022 8:18 AM  History of Present Illness:  Patient is evaluated by video session.  She continues to struggle with anxiety, paranoia, hallucination.  She admitted continue to see and hear demons, people calling her name and paranoid when she go outside.  She admitted few times at grocery store she shouted herself to stop the voices.  3 weeks ago she cut her hair when voices told her to do so.  She occasionally have passive and fleeting suicidal thoughts but no plan or any intent.  We started her on lithium.  She noticed marginal improvement but her biggest concern is lack of sleep and voices.  We also increase trazodone and she is taking 100 mg but only sleep 3 hours.  She admitted there are few times that she took more than 100 mg and still have trouble sleeping because of the voices.  She is getting physical therapy and may require 16 weeks to help her balance.  She does go outside with her quad walker.  She also have a lot of pain in her back.  She is hoping to get ablation soon.  She has mild tremors.  She takes the hydroxyzine prescribed by primary care 2-3 times a day.  She admitted irritability, frustration because she is not getting better.  She is in therapy with Ms. Lucretia Field.  Tomorrow she has appointment for psych evaluation for disability.  She is hoping it goes well.  She was pleased last night her 12 year old daughter came to see her and stay overnight.  She  also have a 81 year old daughter and 69 year old daughter who lives with a boyfriend.  Her parents helps her a lot.  We have recommended neurology because of forgetfulness, sometimes confusion, memory impairment.  Patient has appointment with neurology but she do not remember the dates.  Her appetite is okay.  Her weight is stable.  Past Psychiatric History: Saw therapist in [facility] after brother killed.  Took Zoloft and lorazepam by PCP.  She took Lamictal but stopped after the rash.  Trazodone helped but wanted to try something for anxiety and it was switched to hydroxyzine.  No h/o inpatient treatment, suicidal attempt, psychosis or any mania.  H/O irritability, anger and mood swing.  Tried Lamictal but stopped due to rash.    Outpatient Encounter Medications as of 08/08/2022  Medication Sig   acetaminophen (TYLENOL) 650 MG CR tablet Take 1,300 mg by mouth every 8 (eight) hours. Takes it 2 to 3 times per day   ARIPiprazole (ABILIFY) 20 MG tablet Take 1 tablet (20 mg total) by mouth daily.   diclofenac Sodium (VOLTAREN) 1 % GEL APPLY 4 G TOPICALLY 4 TIMES DAILY   hydrOXYzine (ATARAX) 50 MG tablet TAKE 1 TABLET BY MOUTH AT BEDTIME AS NEEDED. CAN TAKE UP TO 3 TIMES DAILY.   linaclotide (LINZESS) 145 MCG CAPS capsule Take 1 capsule (145 mcg total) by mouth daily before breakfast.   lithium carbonate (LITHOBID) 300 MG ER tablet Take 1 tablet (300 mg total)  by mouth every morning.   promethazine (PHENERGAN) 12.5 MG tablet TAKE 1 TABLET BY MOUTH EVERY 6 HOURS AS NEEDED FOR NAUSEA OR VOMITING.   tiZANidine (ZANAFLEX) 2 MG tablet TAKE 1 TO 2 TABLETS (2-4 MG TOTAL) BY MOUTH EVERY 6 HOURS AS NEEDED   traZODone (DESYREL) 100 MG tablet Take 1 tablet (100 mg total) by mouth at bedtime.   No facility-administered encounter medications on file as of 08/08/2022.    Recent Results (from the past 2160 hour(s))  POCT HgB A1C     Status: Abnormal   Collection Time: 07/04/22  9:45 AM  Result Value Ref  Range   Hemoglobin A1C 5.8 (A) 4.0 - 5.6 %   HbA1c POC (<> result, manual entry)     HbA1c, POC (prediabetic range)     HbA1c, POC (controlled diabetic range)       Psychiatric Specialty Exam: Physical Exam  Review of Systems  Musculoskeletal:  Positive for back pain.  Neurological:  Positive for dizziness, tremors and weakness.    Weight 141 lb (64 kg).There is no height or weight on file to calculate BMI.  General Appearance: Casual and she cut her hair.  Eye Contact:  Fair  Speech:  Slow  Volume:  Decreased  Mood:  Anxious and Depressed  Affect:  Constricted and Depressed  Thought Process:  Descriptions of Associations: Intact  Orientation:  Full (Time, Place, and Person)  Thought Content:  Hallucinations: Auditory Visual, Ideas of Reference:   Paranoia, Paranoid Ideation, and Rumination  Suicidal Thoughts:  Yes.  without intent/plan  Homicidal Thoughts:  No  Memory:  Immediate;   Fair Recent;   Fair Remote;   Fair  Judgement:  Fair  Insight:  Shallow  Psychomotor Activity:  Decreased  Concentration:  Concentration: Fair and Attention Span: Fair  Recall:  [employer] of Knowledge:  Fair  Language:  Good  Akathisia:  No  Handed:  Right  AIMS (if indicated):     Assets:  Communication Skills Desire for Improvement Housing Social Support  ADL's:  Intact  Cognition:  Impaired,  Mild  Sleep:  3-4 hrs     Assessment/Plan: Mixed bipolar I disorder - Plan: lithium carbonate (LITHOBID) 300 MG ER tablet, traZODone (DESYREL) 100 MG tablet, QUEtiapine (SEROQUEL) 100 MG tablet  Insomnia, unspecified type - Plan: traZODone (DESYREL) 100 MG tablet  Anxiety  Patient continued to have hallucination, paranoia, anxiety and poor sleep.  Despite taking trazodone up to 200 mg by herself has not have significant improvement in her sleep.  Her biggest concern is hallucination.  I recommend trying Seroquel to help her sleep and psychosis.  We will cut down the Abilify from 20 mg to  10 mg and start Seroquel 50 mg-100 mg.  I explained it should help her sleep and hallucination.  Recommend to keep the trazodone for now 100 mg but may need to optimize the dose of Seroquel and further reduction of Abilify.  I also recommend to try lithium 300 mg 2 times a day as patient has no side effects from the lithium.  Her last kidney function tests were normal.  Patient is taking hydroxyzine 50 mg 3 times a day to help her anxiety prescribed by PCP.  Discussed safety concerns and any time having active suicidal thoughts or homicidal thought that she need to call 911 or go to local emergency room.  Encouraged to continue therapy with Ms. Lucretia Field.  Patient has appointment for disability evaluation tomorrow.  I wish her  good luck.  We will follow-up in 3 weeks.  Recommend to call us back if she notices any worsening of symptoms or any side effects from the medication.   Follow Up Instructions:     I discussed the assessment and treatment plan with the patient. The patient was provided an opportunity to ask questions and all were answered. The patient agreed with the plan and demonstrated an understanding of the instructions.   The patient was advised to call back or seek an in-person evaluation if the symptoms worsen or if the condition fails to improve as anticipated.    Collaboration of Care: Other provider involved in patient's care AEB notes are available in epic to review.  Patient/Guardian was advised Release of Information must be obtained prior to any record release in order to collaborate their care with an outside provider. Patient/Guardian was advised if they have not already done so to contact the registration department to sign all necessary forms in order for [name] to release information regarding their care.   Consent: Patient/Guardian gives verbal consent for treatment and assignment of benefits for services provided during this visit. Patient/Guardian expressed understanding and  agreed to proceed.     I provided 30 minutes of non face to face time during this encounter.  Note: This document was prepared by [employer] voice dictation technology and any errors that results from this process are unintentional.    Cleotis Nipper, MD 08/08/2022

## 2022-08-09 ENCOUNTER — Other Ambulatory Visit: Payer: Medicaid Other

## 2022-08-09 NOTE — Patient Instructions (Signed)
Visit Information  Ms. Placide was given information about Medicaid Managed Care team care coordination services and verbally consented to engagement with the [facility] Managed Care team.   Ms. Heindl - following are the goals we discussed in your visit today:   Goals Addressed   None      The Managed Medicaid care management team will reach out to the patient again over the next 30-45 days.   Abelino Derrick, MHA [facility]  Managed [facility] Social Worker [phone]
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
## 2022-08-12 ENCOUNTER — Encounter: Payer: Self-pay | Admitting: Family Medicine

## 2022-08-12 ENCOUNTER — Ambulatory Visit (INDEPENDENT_AMBULATORY_CARE_PROVIDER_SITE_OTHER): Payer: Medicaid Other | Admitting: Family Medicine

## 2022-08-12 VITALS — BP 100/60 | Ht 64.0 in | Wt 141.0 lb

## 2022-08-12 DIAGNOSIS — M25512 Pain in left shoulder: Secondary | ICD-10-CM | POA: Diagnosis present

## 2022-08-12 NOTE — Progress Notes (Signed)
PCP: [name], MD  Subjective:   HPI: Patient is a 47 y.o. female here for left shoulder pain.  3/20: Feeling pain in the front of her left shoulder. Has been taking XS tylenol and that has not helped. Tizanidine does not help at all. 1 month of pain. Has had shots in the left shoulder before for similar pain when diagnosed with frozen shoulder and this feels similar. No sports/throwing actions that she does. No hit/trauma to arm. Does have shooting pain down left side from time to time. Does have neck pain on the left side as well. No numbness or tingling  4/22: Patient reports injection helped her along with the home exercises. However at this point pain has recurred to how it was before last visit. Waking her up at night. Hurts trying to reach and go overhead. No new injuries or trauma. Initial visit with PCP for this on 3/14.  Past Medical History:  Diagnosis Date   Anemia    Anxiety    COVID-19    Depression    Family history of breast cancer 06/02/2018   Fibrocystic disease of both breasts    History of cervical dysplasia    laser surgery   Hyperglycemia 10/14/2020   Hypokalemia 12/03/2021   Insomnia    Left breast mass 06/30/2008   Low blood sugar 2001   pt checks BS in AM, controls with diet   Need for immunization against influenza 01/12/2022   Routine cervical smear 06/20/2020   Routine health maintenance 05/09/2014   Routine screening for STI (sexually transmitted infection) 01/12/2022   S/P mastectomy, bilateral 10/27/2018   Surgery follow-up 10/19/2018   UTI (urinary tract infection) 11/01/2020   Vaginal Pap smear, abnormal     Current Outpatient Medications on File Prior to Visit  Medication Sig Dispense Refill   acetaminophen (TYLENOL) 650 MG CR tablet Take 1,300 mg by mouth every 8 (eight) hours. Takes it 2 to 3 times per day     ARIPiprazole (ABILIFY) 20 MG tablet Take 1 tablet (20 mg total) by mouth daily. (Patient taking differently: Take 10 mg by  mouth daily.) 30 tablet 1   diclofenac Sodium (VOLTAREN) 1 % GEL APPLY 4 G TOPICALLY 4 TIMES DAILY 100 g 1   hydrOXYzine (ATARAX) 50 MG tablet TAKE 1 TABLET BY MOUTH AT BEDTIME AS NEEDED. CAN TAKE UP TO 3 TIMES DAILY. 270 tablet 1   linaclotide (LINZESS) 145 MCG CAPS capsule Take 1 capsule (145 mcg total) by mouth daily before breakfast. 30 capsule 0   lithium carbonate (LITHOBID) 300 MG ER tablet Take 1 tablet (300 mg total) by mouth 2 (two) times daily. 60 tablet 0   promethazine (PHENERGAN) 12.5 MG tablet TAKE 1 TABLET BY MOUTH EVERY 6 HOURS AS NEEDED FOR NAUSEA OR VOMITING. 30 tablet 0   QUEtiapine (SEROQUEL) 100 MG tablet Take 0.5-1 tablets (50-100 mg total) by mouth at bedtime. 30 tablet 0   tiZANidine (ZANAFLEX) 2 MG tablet TAKE 1 TO 2 TABLETS (2-4 MG TOTAL) BY MOUTH EVERY 6 HOURS AS NEEDED 30 tablet 3   traZODone (DESYREL) 100 MG tablet Take 1 tablet (100 mg total) by mouth at bedtime. 30 tablet 0   No current facility-administered medications on file prior to visit.    Past Surgical History:  Procedure Laterality Date   AUGMENTATION MAMMAPLASTY Bilateral    BREAST FIBROADENOMA SURGERY  2011, 2008   benign    BREAST RECONSTRUCTION WITH PLACEMENT OF TISSUE EXPANDER AND FLEX HD (ACELLULAR HYDRATED  DERMIS) Bilateral 10/19/2018   Procedure: BREAST RECONSTRUCTION WITH PLACEMENT OF TISSUE EXPANDER AND FLEX HD (ACELLULAR HYDRATED DERMIS);  Surgeon: Peggye Form, DO;  Location: [facility];  Service: Plastics;  Laterality: Bilateral;   CESAREAN SECTION  04/22/1993   HYSTEROSCOPY WITH NOVASURE N/A 01/02/2016   Procedure: NOVASURE;  Surgeon: Adam Phenix, MD;  Location: [facility];  Service: Gynecology;  Laterality: N/A;   MASTECTOMY Bilateral 2019   NIPPLE SPARING MASTECTOMY Bilateral 10/19/2018   Procedure: BILATERAL NIPPLE SPARING MASTECTOMY;  Surgeon: Griselda Miner, MD;  Location: [facility];  Service: General;  Laterality: Bilateral;   REMOVAL OF BILATERAL TISSUE EXPANDERS WITH PLACEMENT OF  BILATERAL BREAST IMPLANTS Bilateral 12/30/2018   Procedure: REMOVAL OF BILATERAL TISSUE EXPANDERS WITH PLACEMENT OF BILATERAL BREAST IMPLANTS;  Surgeon: Peggye Form, DO;  Location: [facility];  Service: Plastics;  Laterality: Bilateral;   TUBAL LIGATION  04/22/2001    Allergies  Allergen Reactions   Lamictal [Lamotrigine] Rash   Codeine Hives   Oxycodone Hcl Hives   Penicillins Hives and Nausea And Vomiting    BP 100/60   Ht  (1.626 m)   Wt 141 lb (64 kg)   BMI 24.20 kg/m       No data to display              No data to display              Objective:  Physical Exam:  Gen: NAD, comfortable in exam room  Left shoulder: No swelling, ecchymoses.  No gross deformity. ROM - ER to 45 degrees equal to right shoulder.  Full passive flexion and abduction, much improved.  Painful arc. Positive Hawkins, Neers. Negative Yergasons. Strength 5/5 with empty can and resisted internal/external rotation.  Pain empty can and ER. Positive o'briens. NV intact distally.   Assessment & Plan:  1. Left shoulder pain - glenohumeral injection and motion exercises have helped her some and now without passive motion of her shoulder compared to right - no longer with evidence adhesive capsulitis.  Concern for concurrent issue - labral tear or rotator cuff tear.  Radiographs negative and completed 6 weeks of conservative treatment.  Will proceed with MRI to assess.

## 2022-08-12 NOTE — Patient Instructions (Signed)
Your motion is much better showing the injection and motion exercises have helped with the frozen shoulder but you're still having pain. We will go ahead with an MRI - I will call you with results and next steps. Continue with your exercises in the meantime. Tylenol, voltaren gel as needed for pain.

## 2022-08-15 ENCOUNTER — Encounter (HOSPITAL_COMMUNITY): Payer: Self-pay | Admitting: Clinical

## 2022-08-15 ENCOUNTER — Ambulatory Visit (INDEPENDENT_AMBULATORY_CARE_PROVIDER_SITE_OTHER): Payer: Medicaid Other | Admitting: Clinical

## 2022-08-15 DIAGNOSIS — F410 Panic disorder [episodic paroxysmal anxiety] without agoraphobia: Secondary | ICD-10-CM

## 2022-08-15 DIAGNOSIS — F319 Bipolar disorder, unspecified: Secondary | ICD-10-CM

## 2022-08-15 DIAGNOSIS — F4310 Post-traumatic stress disorder, unspecified: Secondary | ICD-10-CM

## 2022-08-15 DIAGNOSIS — F411 Generalized anxiety disorder: Secondary | ICD-10-CM
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
## 2022-08-20 ENCOUNTER — Other Ambulatory Visit: Payer: Self-pay | Admitting: Family Medicine

## 2022-08-20 DIAGNOSIS — G4700 Insomnia, unspecified: Secondary | ICD-10-CM

## 2022-08-20 DIAGNOSIS — F419 Anxiety disorder, unspecified: Secondary | ICD-10-CM

## 2022-08-21 ENCOUNTER — Ambulatory Visit: Payer: Medicaid Other | Attending: Family Medicine

## 2022-08-21 DIAGNOSIS — M25512 Pain in left shoulder: Secondary | ICD-10-CM | POA: Diagnosis present

## 2022-08-21 DIAGNOSIS — M25612 Stiffness of left shoulder, not elsewhere classified: Secondary | ICD-10-CM | POA: Diagnosis present

## 2022-08-21 DIAGNOSIS — R2689 Other abnormalities of gait and mobility: Secondary | ICD-10-CM

## 2022-08-21 DIAGNOSIS — M5459 Other low back pain: Secondary | ICD-10-CM

## 2022-08-21 DIAGNOSIS — M6281 Muscle weakness (generalized): Secondary | ICD-10-CM | POA: Diagnosis present
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
## 2022-08-23 ENCOUNTER — Other Ambulatory Visit: Payer: Self-pay | Admitting: General Surgery

## 2022-08-23 DIAGNOSIS — N644 Mastodynia: Secondary | ICD-10-CM

## 2022-08-25 ENCOUNTER — Ambulatory Visit
Admission: RE | Admit: 2022-08-25 | Discharge: 2022-08-25 | Disposition: A | Discharge status: Home / Self Care | Payer: Medicaid Other | Source: Ambulatory Visit | Attending: Family Medicine | Admitting: Family Medicine

## 2022-08-25 DIAGNOSIS — M25512 Pain in left shoulder: Secondary | ICD-10-CM

## 2022-08-26 ENCOUNTER — Encounter (HOSPITAL_COMMUNITY): Payer: Self-pay | Admitting: Clinical

## 2022-08-26 ENCOUNTER — Ambulatory Visit (INDEPENDENT_AMBULATORY_CARE_PROVIDER_SITE_OTHER): Payer: Medicaid Other | Admitting: Clinical

## 2022-08-26 DIAGNOSIS — F410 Panic disorder [episodic paroxysmal anxiety] without agoraphobia: Secondary | ICD-10-CM

## 2022-08-26 DIAGNOSIS — F319 Bipolar disorder, unspecified: Secondary | ICD-10-CM

## 2022-08-26 DIAGNOSIS — F411 Generalized anxiety disorder: Secondary | ICD-10-CM

## 2022-08-26 DIAGNOSIS — F4310 Post-traumatic stress disorder, unspecified: Secondary | ICD-10-CM
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
## 2022-08-28 ENCOUNTER — Ambulatory Visit: Payer: Medicaid Other

## 2022-08-28 DIAGNOSIS — M5459 Other low back pain: Secondary | ICD-10-CM | POA: Diagnosis not present

## 2022-08-28 DIAGNOSIS — M25612 Stiffness of left shoulder, not elsewhere classified: Secondary | ICD-10-CM

## 2022-08-28 DIAGNOSIS — M25512 Pain in left shoulder: Secondary | ICD-10-CM

## 2022-08-28 DIAGNOSIS — R2689 Other abnormalities of gait and mobility: Secondary | ICD-10-CM

## 2022-08-28 DIAGNOSIS — M6281 Muscle weakness (generalized): Secondary | ICD-10-CM

## 2022-08-29 ENCOUNTER — Ambulatory Visit: Payer: Medicaid Other | Admitting: Physical Therapy

## 2022-08-30 ENCOUNTER — Other Ambulatory Visit (HOSPITAL_COMMUNITY): Payer: Self-pay | Admitting: *Deleted

## 2022-08-30 ENCOUNTER — Telehealth (HOSPITAL_BASED_OUTPATIENT_CLINIC_OR_DEPARTMENT_OTHER): Payer: Medicaid Other | Admitting: Psychiatry

## 2022-08-30 ENCOUNTER — Telehealth (HOSPITAL_COMMUNITY): Payer: Self-pay | Admitting: *Deleted

## 2022-08-30 ENCOUNTER — Other Ambulatory Visit (HOSPITAL_COMMUNITY): Payer: Self-pay | Admitting: Psychiatry

## 2022-08-30 ENCOUNTER — Encounter (HOSPITAL_COMMUNITY): Payer: Self-pay | Admitting: Psychiatry

## 2022-08-30 VITALS — Wt 146.0 lb

## 2022-08-30 DIAGNOSIS — G4700 Insomnia, unspecified: Secondary | ICD-10-CM

## 2022-08-30 DIAGNOSIS — F419 Anxiety disorder, unspecified: Secondary | ICD-10-CM | POA: Diagnosis not present

## 2022-08-30 DIAGNOSIS — Z5181 Encounter for therapeutic drug level monitoring: Secondary | ICD-10-CM

## 2022-08-30 DIAGNOSIS — F3160 Bipolar disorder, current episode mixed, unspecified: Secondary | ICD-10-CM

## 2022-08-30 MED ORDER — LITHIUM CARBONATE ER 300 MG PO TBCR
300.0000 mg | EXTENDED_RELEASE_TABLET | Freq: Two times a day (BID) | ORAL | 1 refills | Status: DC
Start: 2022-08-30 — End: 2022-09-04

## 2022-08-30 MED ORDER — QUETIAPINE FUMARATE 150 MG PO TABS
150.0000 mg | ORAL_TABLET | Freq: Every day | ORAL | 1 refills | Status: DC
Start: 2022-08-30 — End: 2022-10-22

## 2022-08-30 NOTE — Progress Notes (Signed)
[facility] MD Virtual Progress Note   Patient Location: Home Provider Location: Home Office  I connect with patient by video and verified that I am speaking with correct person by using two identifiers. I discussed the limitations of evaluation and management by telemedicine and the availability of in person appointments. I also discussed with the patient that there may be a patient responsible charge related to this service. The patient expressed understanding and agreed to proceed.  Bethany [name] [id number] 47 y.o.  08/30/2022 10:45 AM  History of Present Illness:  Patient is evaluated by video session.  We started her on Seroquel and she is no longer taking Abilify.  She noticed improvement in her sleep, anxiety and hallucination.  She still have auditory and visual hallucination and sometimes she is seeing demons but they are not intense.  Last time she cut her hair and responding to hallucination.  She notices still have command auditory hallucination as she gave example to leave the door open but she did not act on it.  She is pleased that her 59 year old daughter in [place] came to visit her for Mother's Day gift.  Her 68 year old daughter lives in [place] and her 38 year old live in [place].  Patient is very pleased that her disability finally went through.  She admitted sometimes struggle with sleep and irritability.  She has tremors and shakes.  Yesterday she had a radioablation for her back.  So far she noticed things are going okay.  She also in therapy with Ms. Lucretia Field.  She is compliant with the lithium but we do not have any lithium level.  She is taking trazodone which we cut down on the last visit.  She has no more dizziness.  She denies any mania, anger, impulsive behavior.  Her anxiety is somewhat better as started talking to therapist.  Past Psychiatric History: Saw therapist in [facility] after brother killed.  Took Zoloft and lorazepam by PCP.  She took  Lamictal but stopped after the rash.  Trazodone helped but wanted to try something for anxiety and it was switched to hydroxyzine.  No h/o inpatient treatment, suicidal attempt, psychosis or any mania.  H/O irritability, anger and mood swing.  Tried Lamictal but stopped due to rash.    Outpatient Encounter Medications as of 08/30/2022  Medication Sig   hydrOXYzine (ATARAX) 50 MG tablet TAKE 1 TABLET BY MOUTH AT BEDTIME AS NEEDED. MAY TAKE UP TO 3 TIMES DAILY.   lithium carbonate (LITHOBID) 300 MG ER tablet Take 1 tablet (300 mg total) by mouth 2 (two) times daily.   acetaminophen (TYLENOL) 650 MG CR tablet Take 1,300 mg by mouth every 8 (eight) hours. Takes it 2 to 3 times per day   diclofenac Sodium (VOLTAREN) 1 % GEL APPLY 4 G TOPICALLY 4 TIMES DAILY   linaclotide (LINZESS) 145 MCG CAPS capsule Take 1 capsule (145 mcg total) by mouth daily before breakfast.   promethazine (PHENERGAN) 12.5 MG tablet TAKE 1 TABLET BY MOUTH EVERY 6 HOURS AS NEEDED FOR NAUSEA OR VOMITING.   QUEtiapine 150 MG TABS Take 150 mg by mouth at bedtime.   tiZANidine (ZANAFLEX) 2 MG tablet TAKE 1 TO 2 TABLETS (2-4 MG TOTAL) BY MOUTH EVERY 6 HOURS AS NEEDED   traZODone (DESYREL) 100 MG tablet Take 1 tablet (100 mg total) by mouth at bedtime. (Patient not taking: Reported on 08/30/2022)   [DISCONTINUED] ARIPiprazole (ABILIFY) 20 MG tablet Take 1 tablet (20 mg total) by mouth daily. (Patient taking differently:  Take 10 mg by mouth daily.)   [DISCONTINUED] lithium carbonate (LITHOBID) 300 MG ER tablet Take 1 tablet (300 mg total) by mouth 2 (two) times daily.   [DISCONTINUED] QUEtiapine (SEROQUEL) 100 MG tablet Take 0.5-1 tablets (50-100 mg total) by mouth at bedtime.   No facility-administered encounter medications on file as of 08/30/2022.    Recent Results (from the past 2160 hour(s))  POCT HgB A1C     Status: Abnormal   Collection Time: 07/04/22  9:45 AM  Result Value Ref Range   Hemoglobin A1C 5.8 (A) 4.0 - 5.6 %    HbA1c POC (<> result, manual entry)     HbA1c, POC (prediabetic range)     HbA1c, POC (controlled diabetic range)       Psychiatric Specialty Exam: Physical Exam  Review of Systems  Weight 146 lb (66.2 kg).There is no height or weight on file to calculate BMI.  General Appearance: Casual  Eye Contact:  Fair  Speech:  Slow  Volume:  Decreased  Mood:  Anxious and Dysphoric  Affect:  Constricted  Thought Process:  Descriptions of Associations: Intact  Orientation:  Full (Time, Place, and Person)  Thought Content:  Rumination  Suicidal Thoughts:  No  Homicidal Thoughts:  No  Memory:  Immediate;   Fair Recent;   Fair Remote;   Fair  Judgement:  Intact  Insight:  Present  Psychomotor Activity:  Decreased and Tremor  Concentration:  Concentration: Fair and Attention Span: Fair  Recall:  Good  Fund of Knowledge:  Good  Language:  Good  Akathisia:  No  Handed:  Right  AIMS (if indicated):     Assets:  Communication Skills Desire for Improvement Housing Social Support  ADL's:  Intact  Cognition:  Impaired,  Mild  Sleep:  better     Assessment/Plan: Mixed bipolar I disorder (HCC) - Plan: lithium carbonate (LITHOBID) 300 MG ER tablet, QUEtiapine 150 MG TABS  Insomnia, unspecified type  Anxiety - Plan: QUEtiapine 150 MG TABS  I reviewed current.  She started therapy with Ms. Lucretia Field.  She is doing radioablation for her back pain.  Recommend to discontinue trazodone and try higher dose of Seroquel to minimize polypharmacy.  Currently she is taking Seroquel 100 mg and the new dose will be 150 mg at bedtime.  Continue lithium 300 mg twice a day.  We will do lithium level.  She is getting hydroxyzine 50 mg 3 times a day from primary care doctor and she like to keep because it does help tremors.  I discussed polypharmacy can cause constipation, dry mouth and since patient is not ambulatory these symptoms may get worse.  Encourage try to walk as much as possible.  We will order  lithium level.  Recommend to call us back if she has any question or any concern.  Patient is pleased that her disability finally approved.  Encouraged to continue therapy with Ms. Lucretia Field.  Follow-up in 2 months.   Follow Up Instructions:     I discussed the assessment and treatment plan with the patient. The patient was provided an opportunity to ask questions and all were answered. The patient agreed with the plan and demonstrated an understanding of the instructions.   The patient was advised to call back or seek an in-person evaluation if the symptoms worsen or if the condition fails to improve as anticipated.    Collaboration of Care: Other provider involved in patient's care AEB notes are available in epic to review.  Patient/Guardian  was advised Release of Information must be obtained prior to any record release in order to collaborate their care with an outside provider. Patient/Guardian was advised if they have not already done so to contact the registration department to sign all necessary forms in order for [name] to release information regarding their care.   Consent: Patient/Guardian gives verbal consent for treatment and assignment of benefits for services provided during this visit. Patient/Guardian expressed understanding and agreed to proceed.     I provided 30 minutes of non face to face time during this encounter.  Note: This document was prepared by [employer] voice dictation technology and any errors that results from this process are unintentional.    Cleotis Nipper, MD 08/30/2022

## 2022-08-30 NOTE — Telephone Encounter (Signed)
Writer advised pt that Lithium level has been ordered and faxed to [facility] on [address] [place]. Pt advised to hold Lithium dose prior to blood work. Pt verbalizes understanding stating that she had taken her Lithium this morning so will have to go next week.

## 2022-09-02 ENCOUNTER — Ambulatory Visit (INDEPENDENT_AMBULATORY_CARE_PROVIDER_SITE_OTHER): Payer: Medicaid Other | Admitting: Family Medicine

## 2022-09-02 ENCOUNTER — Other Ambulatory Visit: Payer: Self-pay

## 2022-09-02 VITALS — BP 124/78 | Ht 64.0 in | Wt 148.0 lb

## 2022-09-02 DIAGNOSIS — M25512 Pain in left shoulder: Secondary | ICD-10-CM

## 2022-09-02 MED ORDER — METHYLPREDNISOLONE ACETATE 40 MG/ML IJ SUSP
40.0000 mg | Freq: Once | INTRAMUSCULAR | Status: AC
  Administered 2022-09-02: 40 mg via INTRA_ARTICULAR

## 2022-09-03 ENCOUNTER — Telehealth (HOSPITAL_COMMUNITY): Payer: Self-pay | Admitting: *Deleted

## 2022-09-03 LAB — LITHIUM LEVEL: Lithium Lvl: 0.3 mmol/L — ABNORMAL LOW (ref 0.5–1.2)

## 2022-09-03 NOTE — Telephone Encounter (Signed)
Lithium level results received form [facility]. Blood drawn on 09/02/22.   Lithium Level: 0.3 mmol/L

## 2022-09-03 NOTE — Progress Notes (Signed)
Patient returns today for subacromial injection after discussion of MRI results.  Continue physical therapy.  After informed written consent timeout was performed, patient was seated in chair in exam room. Left shoulder was prepped with alcohol swab and utilizing lateral approach with ultrasound guidance, patient's left subacromial space was injected with 3:1 lidocaine: depomedrol. Patient tolerated the procedure well without immediate complications.
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
## 2022-09-03 NOTE — Telephone Encounter (Signed)
Please inform her to take lithium 3 times a day.

## 2022-09-04 ENCOUNTER — Other Ambulatory Visit (HOSPITAL_COMMUNITY): Payer: Self-pay | Admitting: *Deleted

## 2022-09-04 ENCOUNTER — Ambulatory Visit: Payer: Medicaid Other

## 2022-09-04 DIAGNOSIS — M25512 Pain in left shoulder: Secondary | ICD-10-CM

## 2022-09-04 DIAGNOSIS — M5459 Other low back pain: Secondary | ICD-10-CM | POA: Diagnosis not present

## 2022-09-04 DIAGNOSIS — M25612 Stiffness of left shoulder, not elsewhere classified: Secondary | ICD-10-CM

## 2022-09-04 DIAGNOSIS — F3160 Bipolar disorder, current episode mixed, unspecified: Secondary | ICD-10-CM

## 2022-09-04 DIAGNOSIS — M6281 Muscle weakness (generalized): Secondary | ICD-10-CM

## 2022-09-04 DIAGNOSIS — R2689 Other abnormalities of gait and mobility: Secondary | ICD-10-CM

## 2022-09-04 MED ORDER — LITHIUM CARBONATE ER 300 MG PO TBCR
300.0000 mg | EXTENDED_RELEASE_TABLET | Freq: Three times a day (TID) | ORAL | 0 refills | Status: DC
Start: 2022-09-04 — End: 2022-10-30

## 2022-09-04 NOTE — Telephone Encounter (Signed)
PT AGREES
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
## 2022-09-05 ENCOUNTER — Ambulatory Visit: Payer: Medicaid Other

## 2022-09-05 DIAGNOSIS — M25612 Stiffness of left shoulder, not elsewhere classified: Secondary | ICD-10-CM

## 2022-09-05 DIAGNOSIS — M6281 Muscle weakness (generalized): Secondary | ICD-10-CM

## 2022-09-05 DIAGNOSIS — M25512 Pain in left shoulder: Secondary | ICD-10-CM

## 2022-09-05 DIAGNOSIS — R2689 Other abnormalities of gait and mobility: Secondary | ICD-10-CM

## 2022-09-05 DIAGNOSIS — M5459 Other low back pain: Secondary | ICD-10-CM

## 2022-09-09 ENCOUNTER — Ambulatory Visit (INDEPENDENT_AMBULATORY_CARE_PROVIDER_SITE_OTHER): Payer: Medicaid Other | Admitting: Clinical

## 2022-09-09 ENCOUNTER — Encounter (HOSPITAL_COMMUNITY): Payer: Self-pay | Admitting: Clinical

## 2022-09-09 DIAGNOSIS — F410 Panic disorder [episodic paroxysmal anxiety] without agoraphobia: Secondary | ICD-10-CM

## 2022-09-09 DIAGNOSIS — F411 Generalized anxiety disorder: Secondary | ICD-10-CM

## 2022-09-09 DIAGNOSIS — F4310 Post-traumatic stress disorder, unspecified: Secondary | ICD-10-CM

## 2022-09-09 DIAGNOSIS — F3160 Bipolar disorder, current episode mixed, unspecified: Secondary | ICD-10-CM
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
## 2022-09-10 ENCOUNTER — Encounter: Payer: Self-pay | Admitting: Plastic Surgery

## 2022-09-10 ENCOUNTER — Ambulatory Visit (INDEPENDENT_AMBULATORY_CARE_PROVIDER_SITE_OTHER): Payer: Medicaid Other | Admitting: Plastic Surgery

## 2022-09-10 VITALS — BP 120/79 | HR 94 | Ht 64.0 in | Wt 152.2 lb

## 2022-09-10 DIAGNOSIS — M7502 Adhesive capsulitis of left shoulder: Secondary | ICD-10-CM

## 2022-09-10 DIAGNOSIS — N644 Mastodynia: Secondary | ICD-10-CM

## 2022-09-10 DIAGNOSIS — Z9882 Breast implant status: Secondary | ICD-10-CM | POA: Diagnosis not present

## 2022-09-10 DIAGNOSIS — N649 Disorder of breast, unspecified: Secondary | ICD-10-CM

## 2022-09-10 DIAGNOSIS — F319 Bipolar disorder, unspecified: Secondary | ICD-10-CM
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
## 2022-09-11 ENCOUNTER — Other Ambulatory Visit: Payer: Medicaid Other

## 2022-09-11 ENCOUNTER — Ambulatory Visit: Payer: Medicaid Other | Admitting: Physical Therapy

## 2022-09-11 ENCOUNTER — Encounter: Payer: Self-pay | Admitting: Physical Therapy

## 2022-09-11 DIAGNOSIS — R2689 Other abnormalities of gait and mobility: Secondary | ICD-10-CM

## 2022-09-11 DIAGNOSIS — M6281 Muscle weakness (generalized): Secondary | ICD-10-CM

## 2022-09-11 DIAGNOSIS — M5459 Other low back pain: Secondary | ICD-10-CM | POA: Diagnosis not present

## 2022-09-11 DIAGNOSIS — M25612 Stiffness of left shoulder, not elsewhere classified: Secondary | ICD-10-CM

## 2022-09-11 DIAGNOSIS — M25512 Pain in left shoulder: Secondary | ICD-10-CM

## 2022-09-11 NOTE — Patient Outreach (Signed)
  Medicaid Managed Care   Unsuccessful Outreach Note  09/11/2022 Name: Saloma S Perfetti MRN: [id number] DOB: 07/18/1975  Referred by: Mabe, Gerald, MD Reason for referral : High Risk Managed Medicaid ([employer] work unsuccessful telephone outreach )   An unsuccessful telephone outreach was attempted today. The patient was referred to the case management team for assistance with care management and care coordination.   Follow Up Plan: The patient has been provided with contact information for the care management team and has been advised to call with any health related questions or concerns.   Caitlen Worth, BSW, MHA [place]  Managed Medicaid Social Worker [phone]  

## 2022-09-11 NOTE — Patient Instructions (Signed)
  Medicaid Managed Care   Unsuccessful Outreach Note  09/11/2022 Name: Bethany [name] MRN: [id number] DOB: 1975-06-02  Referred by: [name], MD Reason for referral : High Risk Managed Medicaid ([employer] Social work unsuccessful telephone outreach )   An unsuccessful telephone outreach was attempted today. The patient was referred to the case management team for assistance with care management and care coordination.   Follow Up Plan: The patient has been provided with contact information for the care management team and has been advised to call with any health related questions or concerns.   Abelino Derrick, MHA [facility]  Managed [facility] Social Worker [phone]
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
## 2022-09-12 ENCOUNTER — Ambulatory Visit: Payer: Medicaid Other | Admitting: Physical Therapy

## 2022-09-17 ENCOUNTER — Telehealth: Payer: Self-pay

## 2022-09-17 NOTE — Telephone Encounter (Signed)
Uploaded cpt coded [id number] confirmation# [phone] w

## 2022-09-18 ENCOUNTER — Ambulatory Visit: Payer: Medicaid Other | Admitting: Physical Therapy

## 2022-09-19 ENCOUNTER — Ambulatory Visit: Payer: Medicaid Other

## 2022-09-21 ENCOUNTER — Other Ambulatory Visit: Payer: Self-pay | Admitting: Family Medicine

## 2022-09-21 ENCOUNTER — Other Ambulatory Visit (HOSPITAL_COMMUNITY): Payer: Self-pay | Admitting: Psychiatry

## 2022-09-21 DIAGNOSIS — K5900 Constipation, unspecified: Secondary | ICD-10-CM

## 2022-09-21 DIAGNOSIS — F3160 Bipolar disorder, current episode mixed, unspecified: Secondary | ICD-10-CM

## 2022-09-21 DIAGNOSIS — G4700 Insomnia, unspecified: Secondary | ICD-10-CM

## 2022-09-23 ENCOUNTER — Encounter (HOSPITAL_COMMUNITY): Payer: Self-pay | Admitting: Clinical

## 2022-09-23 ENCOUNTER — Ambulatory Visit (INDEPENDENT_AMBULATORY_CARE_PROVIDER_SITE_OTHER): Payer: Medicaid Other | Admitting: Clinical

## 2022-09-23 DIAGNOSIS — F3160 Bipolar disorder, current episode mixed, unspecified: Secondary | ICD-10-CM

## 2022-09-23 DIAGNOSIS — F410 Panic disorder [episodic paroxysmal anxiety] without agoraphobia: Secondary | ICD-10-CM

## 2022-09-23 DIAGNOSIS — F4310 Post-traumatic stress disorder, unspecified: Secondary | ICD-10-CM

## 2022-09-23 DIAGNOSIS — F411 Generalized anxiety disorder: Secondary | ICD-10-CM | POA: Diagnosis not present

## 2022-09-23 NOTE — Progress Notes (Signed)
THERAPIST PROGRESS NOTE  Session Time: 9:06-9:51am  Session #6  Virtual Visit via Video Note  I connected with Bethany [name] on 09/23/22 at  9:00 AM EDT by a video enabled telemedicine application and verified that I am speaking with the correct person using two identifiers.  Location: Patient: home Provider: [facility] [place] Outpatient therapy office   I discussed the limitations of evaluation and management by telemedicine and the availability of in person appointments. The patient expressed understanding and agreed to proceed.  I discussed the assessment and treatment plan with the patient. The patient was provided an opportunity to ask questions and all were answered. The patient agreed with the plan and demonstrated an understanding of the instructions.   The patient was advised to call back or seek an in-person evaluation if the symptoms worsen or if the condition fails to improve as anticipated.  I provided 45 minutes of non-face-to-face time during this encounter.  Lynnell Chad, LCSW    Participation Level: Active  Behavioral Response: Casual and Well Groomed Alert Anxious, Depressed, Hopeless, and Tearful  Type of Therapy: Individual Therapy  Treatment Goals addressed:  LTG: Increase ability to regulate mood as evidenced by fewer highs and fewer lows that impact Bethany [name]'s life  STG: Be free of suicidal thoughts; call crisis hotline if having suicidal thoughts   LTG: Explore and resolve issues related to the various traumatic events in her life  STG: Learn about typical long term/residual effects of traumatic life experiences LTG: Improve ability to see world as others do, as well as ability to accept that they do not see the world as she does.  STG: Be able to use reality testing on her auditory/visual hallucinations and paranoid beliefs   ProgressTowards Goals: Progressing  Interventions: CBT  Summary: Bethany [name] is a 46 y.o. female who presents with  Generalized Anxiety Disorder with panic attacks, Bipolar Disorder, and Post-Traumatic Stress Disorder.  CSW provided mood monitoring and treatment progress review in the context of this episode of treatment.   Patient reported that her mood has been "emotional today".   She cried through most of the session, stating "I woke up this way" and saying that she needs a medicine to help her get through such times.  CSW examined her medication list and she reported she is pretty much taking the prescribed Hydroxyzine three times a day on a schedule rather than PRN.  CSW explained to her that often, and probably in this case specifically, the medicines prescribed to a person are meant to enable them to be comfortable enough to use the coping skills necessary to deal with life events, rather than medicines being able to "take care of" such an issue.  She reported that the only coping skill she has used so far today is "a kleenex."  She cried and blotted her face throughout the session.  She was not able to clearly describe what was going on, and as CSW tried to help her identify thoughts she was having in order to look at the accompanying feelings, she said her mind was crowded and everything is clashing, but there were no specific thoughts.  She was pushed gently but remained convinced she was not thinking, only feeling "like a roller coaster."  As a result, CSW decided to focus on giving her confidence that she knows how to calm herself down.  CSW took her through 5-finger breathing and then a 5-minute guided imagery exercise.  She did great during both of these,  but as soon as they were over and she opened her eyes, she started to crying again.  She responded under questioning that she felt these things were helpful while they were going on.  CSW encouraged her to keep using phone apps and YouTube for meditations, even if it means doing multiples in a day, providing examples of many people who use multiple meetings  and/or meditations daily.    She stated "I'm losing my mind," which enabled CSW to immediately take her through the realization that this is a thought, then to analyze whether that thought was based in a cognitive distortions, asking questions such as (1) is it a fact?, (2)  What advice would you give a friend?, (3) how would someone else see this thought?, (4) is the thought helpful or beneficial?, and (5) is there a replacement thought that would be more accurate?  CSW recommended that she continue to do meditations daily to reduce the tension in her central nervous system to a more reasonable and manageable level, as well as to engage in at least one behavioral activation such as walking every day until next session.  She was in agreement to try these.  Suicidal/Homicidal: No  Therapist Response: Patient is making progress AEB her ability to talk to CSW and be honest about her bad feelings.  When she finally declared a thought she was having, "I'm losing my mind," she was open to the process of looking at that thought through a CBT lens in order to try to learn from it.  She was feeling overwhelmed yet agreed to and did the deep breathing exercise and the guided imagery meditation led by CSW.  She noticeably calmed during each of those.   CSW gave patient the opportunity to explore thoughts and feelings associated with current life situations and past/present external stressors as desired.   CSW encouraged patient's expression of feelings and validated patient's thoughts, using empathy, active listening, open body language, and unconditional positive regard.  Patient demonstrated an orientation to time, place, person and situation.     Recommendations:  Return to therapy in 2 weeks, use meditations and behavioral activations daily, report back at next session ready to talk about progress  Plan: Return again in 2 weeks.  Diagnosis: Mixed bipolar I disorder (HCC)  Generalized anxiety disorder with  panic attacks  Post-traumatic stress disorder, unspecified  Collaboration of Care: Psychiatrist AEB - doctor can read therapy notes  Patient/Guardian was advised Release of Information must be obtained prior to any record release in order to collaborate their care with an outside provider. Patient/Guardian was advised if they have not already done so to contact the registration department to sign all necessary forms in order for [name] to release information regarding their care.   Consent: Patient/Guardian gives verbal consent for treatment and assignment of benefits for services provided during this visit. Patient/Guardian expressed understanding and agreed to proceed.   Lynnell Chad, LCSW 09/23/2022

## 2022-09-24 ENCOUNTER — Ambulatory Visit
Admission: RE | Admit: 2022-09-24 | Discharge: 2022-09-24 | Disposition: A | Discharge status: Home / Self Care | Payer: Medicaid Other | Source: Ambulatory Visit | Attending: General Surgery | Admitting: General Surgery

## 2022-09-24 DIAGNOSIS — N644 Mastodynia: Secondary | ICD-10-CM

## 2022-09-24 MED ORDER — GADOPICLENOL 0.5 MMOL/ML IV SOLN
7.0000 mL | Freq: Once | INTRAVENOUS | Status: AC | PRN
  Administered 2022-09-24: 7 mL via INTRAVENOUS

## 2022-09-25 ENCOUNTER — Encounter: Payer: Medicaid Other | Admitting: Physical Therapy

## 2022-09-26 ENCOUNTER — Other Ambulatory Visit (HOSPITAL_COMMUNITY): Payer: Self-pay | Admitting: Psychiatry

## 2022-09-26 ENCOUNTER — Telehealth: Payer: Self-pay

## 2022-09-26 DIAGNOSIS — F3160 Bipolar disorder, current episode mixed, unspecified: Secondary | ICD-10-CM

## 2022-09-26 NOTE — Telephone Encounter (Signed)
[id number] authorized #[id number] 05/28-05/28/2025. Patient notified via Mychart. Gave file to [facility] to schedule SX

## 2022-09-30 ENCOUNTER — Telehealth: Payer: Self-pay

## 2022-09-30 NOTE — Telephone Encounter (Signed)
Patient came into office wanting to make an appointment for a surgery procedure, informed patient Bethany [name] would be the one to schedule surgery procedures and will give her a call back.  Patient verbalized she is wanting this done ASAP.

## 2022-10-03 ENCOUNTER — Other Ambulatory Visit (HOSPITAL_COMMUNITY): Payer: Self-pay | Admitting: Psychiatry

## 2022-10-03 DIAGNOSIS — F3160 Bipolar disorder, current episode mixed, unspecified: Secondary | ICD-10-CM

## 2022-10-03 DIAGNOSIS — F419 Anxiety disorder, unspecified: Secondary | ICD-10-CM

## 2022-10-07 ENCOUNTER — Ambulatory Visit (INDEPENDENT_AMBULATORY_CARE_PROVIDER_SITE_OTHER): Payer: Medicaid Other | Admitting: Clinical

## 2022-10-07 ENCOUNTER — Ambulatory Visit: Payer: Medicaid Other | Admitting: Family Medicine

## 2022-10-07 ENCOUNTER — Encounter: Payer: Self-pay | Admitting: Family Medicine

## 2022-10-07 VITALS — BP 120/78 | Ht 64.0 in | Wt 148.0 lb

## 2022-10-07 DIAGNOSIS — F410 Panic disorder [episodic paroxysmal anxiety] without agoraphobia: Secondary | ICD-10-CM | POA: Diagnosis not present

## 2022-10-07 DIAGNOSIS — F3160 Bipolar disorder, current episode mixed, unspecified: Secondary | ICD-10-CM | POA: Diagnosis not present

## 2022-10-07 DIAGNOSIS — F411 Generalized anxiety disorder: Secondary | ICD-10-CM

## 2022-10-07 DIAGNOSIS — M25512 Pain in left shoulder: Secondary | ICD-10-CM | POA: Diagnosis present

## 2022-10-07 DIAGNOSIS — F4310 Post-traumatic stress disorder, unspecified: Secondary | ICD-10-CM | POA: Diagnosis not present

## 2022-10-07 NOTE — Progress Notes (Signed)
PCP: [name], MD  Subjective:   HPI: Patient is a 47 y.o. female here for follow left shoulder impingement.  3/20: Feeling pain in the front of her left shoulder. Has been taking XS tylenol and that has not helped. Tizanidine does not help at all. 1 month of pain. Has had shots in the left shoulder before for similar pain when diagnosed with frozen shoulder and this feels similar. No sports/throwing actions that she does. No hit/trauma to arm. Does have shooting pain down left side from time to time. Does have neck pain on the left side as well. No numbness or tingling   4/22: Patient reports injection helped her along with the home exercises. However at this point pain has recurred to how it was before last visit. Waking her up at night. Hurts trying to reach and go overhead. No new injuries or trauma. Initial visit with PCP for this on 3/14.  6/17 Doing well today. Shoulder pain improved with subacromial injection on 5/13. Completed 6 session with PT. Able to reach overhead without pain now.  Doing home exercises. Not needing pain medications   Past Medical History:  Diagnosis Date   Anemia    Anxiety    COVID-19    Depression    Family history of breast cancer 06/02/2018   Fibrocystic disease of both breasts    History of cervical dysplasia    laser surgery   Hyperglycemia 10/14/2020   Hypokalemia 12/03/2021   Insomnia    Left breast mass 06/30/2008   Low blood sugar 2001   pt checks BS in AM, controls with diet   Need for immunization against influenza 01/12/2022   Routine cervical smear 06/20/2020   Routine health maintenance 05/09/2014   Routine screening for STI (sexually transmitted infection) 01/12/2022   S/P mastectomy, bilateral 10/27/2018   Surgery follow-up 10/19/2018   UTI (urinary tract infection) 11/01/2020   Vaginal Pap smear, abnormal     Current Outpatient Medications on File Prior to Visit  Medication Sig Dispense Refill   acetaminophen  (TYLENOL) 650 MG CR tablet Take 1,300 mg by mouth every 8 (eight) hours. Takes it 2 to 3 times per day     diclofenac Sodium (VOLTAREN) 1 % GEL APPLY 4 G TOPICALLY 4 TIMES DAILY 100 g 1   hydrOXYzine (ATARAX) 50 MG tablet TAKE 1 TABLET BY MOUTH AT BEDTIME AS NEEDED. MAY TAKE UP TO 3 TIMES DAILY. 270 tablet 1   LINZESS 145 MCG CAPS capsule TAKE 1 CAPSULE BY MOUTH DAILY BEFORE BREAKFAST. 30 capsule 0   lithium carbonate (LITHOBID) 300 MG ER tablet Take 1 tablet (300 mg total) by mouth 3 (three) times daily. 90 tablet 0   promethazine (PHENERGAN) 12.5 MG tablet TAKE 1 TABLET BY MOUTH EVERY 6 HOURS AS NEEDED FOR NAUSEA OR VOMITING. 30 tablet 0   QUEtiapine 150 MG TABS Take 150 mg by mouth at bedtime. 30 tablet 1   tiZANidine (ZANAFLEX) 2 MG tablet TAKE 1 TO 2 TABLETS (2-4 MG TOTAL) BY MOUTH EVERY 6 HOURS AS NEEDED 30 tablet 3   traZODone (DESYREL) 100 MG tablet Take 1 tablet (100 mg total) by mouth at bedtime. 30 tablet 0   No current facility-administered medications on file prior to visit.    Past Surgical History:  Procedure Laterality Date   AUGMENTATION MAMMAPLASTY Bilateral    BREAST FIBROADENOMA SURGERY  2011, 2008   benign    BREAST RECONSTRUCTION WITH PLACEMENT OF TISSUE EXPANDER AND FLEX HD (ACELLULAR HYDRATED  DERMIS) Bilateral 10/19/2018   Procedure: BREAST RECONSTRUCTION WITH PLACEMENT OF TISSUE EXPANDER AND FLEX HD (ACELLULAR HYDRATED DERMIS);  Surgeon: Peggye Form, DO;  Location: [facility] OR;  Service: Plastics;  Laterality: Bilateral;   CESAREAN SECTION  04/22/1993   HYSTEROSCOPY WITH NOVASURE N/A 01/02/2016   Procedure: NOVASURE;  Surgeon: Adam Phenix, MD;  Location: [facility];  Service: Gynecology;  Laterality: N/A;   MASTECTOMY Bilateral 2019   NIPPLE SPARING MASTECTOMY Bilateral 10/19/2018   Procedure: BILATERAL NIPPLE SPARING MASTECTOMY;  Surgeon: Griselda Miner, MD;  Location: [facility] OR;  Service: General;  Laterality: Bilateral;   REMOVAL OF BILATERAL TISSUE EXPANDERS WITH  PLACEMENT OF BILATERAL BREAST IMPLANTS Bilateral 12/30/2018   Procedure: REMOVAL OF BILATERAL TISSUE EXPANDERS WITH PLACEMENT OF BILATERAL BREAST IMPLANTS;  Surgeon: Peggye Form, DO;  Location: [facility];  Service: Plastics;  Laterality: Bilateral;   TUBAL LIGATION  04/22/2001    Allergies  Allergen Reactions   Lamictal [Lamotrigine] Rash   Codeine Hives   Oxycodone Hcl Hives   Penicillins Hives and Nausea And Vomiting    BP 120/78   Ht 5\' 4"  (1.626 m)   Wt 148 lb (67.1 kg)   BMI 25.40 kg/m       No data to display              No data to display              Objective:  Physical Exam:  Gen: NAD, comfortable in exam room  Left shoulder No swelling, ecchymosis or deformity.  Normal active and passive ROM No pain with resisted ER Mild AC tenderness and crepitus with shoulder flexion  Strength 5/5 Neers and [place] negative.  Negative yergasons Negative empty can and o'briens [place] intact distally   Assessment & Plan:  1. Left shoulder pain- MRI without rotator cuff tear on L shoulder, and mild AC OA.  Rotator cuff impingement now resolved after steroid injection and PT. Continue home exercises most days. Follow up if shoulder pain returns.

## 2022-10-07 NOTE — Progress Notes (Unsigned)
THERAPIST PROGRESS NOTE  Session Time: 9:03-9:45am  Session #7  Virtual Visit via Video Note  I connected with Bethany [name] on 10/08/22 at  9:00 AM EDT by a video enabled telemedicine application and verified that I am speaking with the correct person using two identifiers.  Location: Patient: home Provider: [facility] Outpatient therapy office   I discussed the limitations of evaluation and management by telemedicine and the availability of in person appointments. The patient expressed understanding and agreed to proceed.  I discussed the assessment and treatment plan with the patient. The patient was provided an opportunity to ask questions and all were answered. The patient agreed with the plan and demonstrated an understanding of the instructions.   The patient was advised to call back or seek an in-person evaluation if the symptoms worsen or if the condition fails to improve as anticipated.  I provided 42 minutes of non-face-to-face time during this encounter.  Bethany Chad, LCSW    Participation Level: Active  Behavioral Response: Casual and Well Groomed Alert Anxious and Depressed  Type of Therapy: Individual Therapy  Treatment Goals addressed:  LTG: Increase ability to regulate mood as evidenced by fewer highs and fewer lows that impact Bethany [name]'s life  STG: Be free of suicidal thoughts; call crisis hotline if having suicidal thoughts   LTG: Explore and resolve issues related to the various traumatic events in her life  STG: Learn about typical long term/residual effects of traumatic life experiences LTG: Improve ability to see world as others do, as well as ability to accept that they do not see the world as she does.  STG: Be able to use reality testing on her auditory/visual hallucinations and paranoid beliefs   ProgressTowards Goals: Progressing  Interventions: CBT, Motivational Interviewing, and Supportive  Summary: Bethany [name] is a 47 y.o.  female who presents with Generalized Anxiety Disorder with panic attacks, Bipolar Disorder, and Post-Traumatic Stress Disorder.  She said she is doing "fair, definitely not better, borderline emotional all the time."  She reported that she has had some concerns and pain so her breast implants are scheduled to be removed on 10/30/22.  She is glad that she will be in less pain and wishes she had never had the implants, but stated that now she is going to be flat-chested and be "less of a woman." This was processed but she was not sufficiently insightful to resolve any such feelings at this time.  She said she has had a lot of emotional days since last session and she stated she has not gone out for a walk any since then, does not feel she is ready to be in public.  Some people whom she trusts are allowed to come see her, such as her mother, cousin, and 2 daughters and even them she only trusts "to a certain extent" and does not tell everything.  She stated she has valuables in her home and is paranoid about people.  This likely dates back to when her brother was murdered by his best friend who came to his house every day.  She is prevented from going in public to walk because she imagines being shot or robbed.  She feels her paranoia is reality-based and not exaggerated because "people treat everybody bad."  CSW pointed out several cognitive distortions which she understood but stated the thoughts were nonetheless valid.  CSW was supportive but also suggested there may be a compromise option between complete closedness and complete openness.  CSW asked patient if she is suicidal and she responded that she has thoughts, but nothing she will act on.  She stated she just thinks she would be "better off if I was deceased."  She has panic attacks throughout the day when she encounters loud noises or yelling.  This can be a dog barking or a neighbor yelling for a child.  CSW suggested that many children/teens with  anxiety or noise sensitivities will use noise-cancelling headphones and queried whether this is something she would be willing to try.  She reluctantly agreed to try, but said she does not like to be "shut in" which she believes the headphones/earbuds would promote.    MI was used to ask about her motivation to overcome her social anxiety in order to create a life that involves other people.  On a scale of 1-10, with 1 being "stay in the house and don't even try" and 10 being "I want to get out so I will do anything."  She stated she would rate her motivation between a 3 and 4.  This is because she feels that eventually she will it in herself to get out and make connections with other people.  Right now it is very convenient that everything can be done on-line.  CSW asked what would have to change for her to be able to get out and she stated she is not willing to ride either a bus or an [employer] unless a friend goes with her.  CSW then reviewed the process of exposure therapy and how slowly tolerance is built up over what can be an extensive period of time.  She agreed to make a list of the things that make her anxious and put the items in order of how much anxiety they cause, was told we can start the process by working on exposure to the one that is least intimidating to her.    Finally we reviewed the way in which she uses meditation and/or breathing because she said she is doing this.  She has not used any of the apps or new technique taught in past sessions.  She simply puts on her meditation music, closes the blinds, and closes her eyes.  CSW continued to recommend YouTube videos of guided meditations and/or phone apps.  Suicidal/Homicidal: No  Therapist Response: Patient is progressing AEB engaging in scheduled therapy session although it was shortened due to a pre-op appointment at 10:15am.  She presented oriented x5 and stated she was feeling "paranoid and emotional."  CSW evaluated patient's medication  compliance and self-care since last session.  CSW reviewed the last session with patient who reported she has continued to use past meditation methods and has not tried any of the new methods or aids suggested.  Patient stated her mood remains emotional on a daily basis and she does not feel any better than last session.   CSW assigned patient the task of making a hierarchical list of anxiety-provoking situations prior to next session that needs to be scheduled.  CSW encouraged patient to schedule more therapy sessions for the future, as she has no more appointments scheduled at this time.  Throughout the session, CSW gave patient the opportunity to explore thoughts and feelings associated with current life situations and past/present external stressors.   CSW encouraged patient's expression of feelings and validated patient's thoughts using empathy, active listening, open body language, and unconditional positive regard.       Recommendations:  Return to therapy ASAP, use  meditations and behavioral activations daily, report back at next session ready to talk about progress, make a list of hierarchical anxiety-provoking situations and put them in order from most stressful to least  Plan: Return again at first possible appointment that can be scheduled  Diagnosis: Mixed bipolar I disorder (HCC)  Generalized anxiety disorder with panic attacks  Post-traumatic stress disorder, unspecified  Collaboration of Care: Psychiatrist AEB - doctor can read therapy notes  Patient/Guardian was advised Release of Information must be obtained prior to any record release in order to collaborate their care with an outside provider. Patient/Guardian was advised if they have not already done so to contact the registration department to sign all necessary forms in order for [name] to release information regarding their care.   Consent: Patient/Guardian gives verbal consent for treatment and assignment of benefits for  services provided during this visit. Patient/Guardian expressed understanding and agreed to proceed.   Bethany Chad, LCSW 10/08/2022

## 2022-10-08 ENCOUNTER — Telehealth: Payer: Self-pay | Admitting: *Deleted

## 2022-10-08 ENCOUNTER — Encounter (HOSPITAL_COMMUNITY): Payer: Self-pay | Admitting: Clinical

## 2022-10-08 NOTE — Telephone Encounter (Signed)
Request for surgical clearance faxed to PCP office [phone] with fax confirmation received

## 2022-10-10 ENCOUNTER — Other Ambulatory Visit (HOSPITAL_COMMUNITY): Payer: Self-pay | Admitting: Psychiatry

## 2022-10-10 ENCOUNTER — Other Ambulatory Visit (HOSPITAL_COMMUNITY): Payer: Self-pay

## 2022-10-10 DIAGNOSIS — F3160 Bipolar disorder, current episode mixed, unspecified: Secondary | ICD-10-CM

## 2022-10-10 DIAGNOSIS — G4700 Insomnia, unspecified: Secondary | ICD-10-CM

## 2022-10-10 MED ORDER — TRAZODONE HCL 100 MG PO TABS
100.0000 mg | ORAL_TABLET | Freq: Every day | ORAL | 0 refills | Status: DC
Start: 2022-10-10 — End: 2022-10-22

## 2022-10-11 ENCOUNTER — Telehealth: Payer: Self-pay | Admitting: Family Medicine

## 2022-10-11 ENCOUNTER — Ambulatory Visit (INDEPENDENT_AMBULATORY_CARE_PROVIDER_SITE_OTHER): Payer: Medicaid Other | Admitting: Surgical

## 2022-10-11 ENCOUNTER — Encounter: Payer: Self-pay | Admitting: Surgical

## 2022-10-11 VITALS — BP 128/79 | HR 92 | Ht 64.0 in | Wt 155.2 lb

## 2022-10-11 DIAGNOSIS — M7502 Adhesive capsulitis of left shoulder: Secondary | ICD-10-CM

## 2022-10-11 DIAGNOSIS — N644 Mastodynia: Secondary | ICD-10-CM

## 2022-10-11 MED ORDER — OXYCODONE HCL 5 MG PO TABS: 5.0000 mg | ORAL_TABLET | Freq: Four times a day (QID) | ORAL | 0 refills | Status: AC | PRN

## 2022-10-11 MED ORDER — ONDANSETRON HCL 4 MG PO TABS: 4.0000 mg | ORAL_TABLET | Freq: Three times a day (TID) | ORAL | 0 refills | Status: DC | PRN

## 2022-10-11 MED ORDER — CEPHALEXIN 500 MG PO CAPS: 500.0000 mg | ORAL_CAPSULE | Freq: Four times a day (QID) | ORAL | 0 refills | Status: AC

## 2022-10-11 NOTE — Progress Notes (Signed)
Patient ID: Bethany [name], female    DOB: [birth year], 47 y.o.   MRN: [id number]  Chief Complaint  Patient presents with   Pre-op Exam      ICD-10-CM   1. Breast pain, right  N64.4     2. Adhesive capsulitis of left shoulder  M75.02        History of Present Illness: Bethany [name] is a 47 y.o.  female  with a history of bilateral breast reconstruction.  She presents for preoperative evaluation for upcoming procedure, Removal of bilateral breast implants, scheduled for 10/31/2022 with Dr. Ulice Bold.  The patient has not had problems with anesthesia. No history of DVT/PE.  Reports her aunt has a history of blood clots in her arms and legs, reports she had Alzheimer's and lives a very sedentary lifestyle.  No other family history of DVT or PE.  No family or personal history of bleeding or clotting disorders.  Patient is not currently taking any blood thinners.  No history of CVA/MI.   Summary of Previous Visit: Patient first seen by Dr. Ulice Bold February 2020, underwent immediate breast reconstruction with Flex HD and tissue expanders in June 2020.  Had the expanders removed and implants placed September 2020, smooth moderate plus profile extra gel 295 cc implants.  She is having pain, would like to go ahead and have the implants removed.  She does not want implants replaced.  PMH Significant for: Breast reconstruction with breast implants in place, anemia  Patient is requesting to stay overnight at surgical center for observation.  She reports she is feeling well lately.  No recent changes to her health.  She reports that she has pain, mostly in the right breast and would like the implants removed.  She reports in the past she has tolerated oxycodone with use of Benadryl to prevent hives.  She does not have any other reaction with oxycodone or anaphylactic reactions.  She has tolerated Keflex in the past without any issues despite her penicillin allergy.  Past Medical  History: Allergies: Allergies  Allergen Reactions   Lamictal [Lamotrigine] Rash   Codeine Hives   Oxycodone Hcl Hives   Penicillins Hives and Nausea And Vomiting    Current Medications:  Current Outpatient Medications:    acetaminophen (TYLENOL) 650 MG CR tablet, Take 1,300 mg by mouth every 8 (eight) hours. Takes it 2 to 3 times per day, Disp: , Rfl:    cephALEXin (KEFLEX) 500 MG capsule, Take 1 capsule (500 mg total) by mouth 4 (four) times daily for 3 days., Disp: 12 capsule, Rfl: 0   diclofenac Sodium (VOLTAREN) 1 % GEL, APPLY 4 G TOPICALLY 4 TIMES DAILY, Disp: 100 g, Rfl: 1   hydrOXYzine (ATARAX) 50 MG tablet, TAKE 1 TABLET BY MOUTH AT BEDTIME AS NEEDED. MAY TAKE UP TO 3 TIMES DAILY., Disp: 270 tablet, Rfl: 1   LINZESS 145 MCG CAPS capsule, TAKE 1 CAPSULE BY MOUTH DAILY BEFORE BREAKFAST., Disp: 30 capsule, Rfl: 0   lithium carbonate (LITHOBID) 300 MG ER tablet, Take 1 tablet (300 mg total) by mouth 3 (three) times daily., Disp: 90 tablet, Rfl: 0   ondansetron (ZOFRAN) 4 MG tablet, Take 1 tablet (4 mg total) by mouth every 8 (eight) hours as needed for nausea or vomiting., Disp: 20 tablet, Rfl: 0   oxyCODONE (OXY IR/ROXICODONE) 5 MG immediate release tablet, Take 1 tablet (5 mg total) by mouth every 6 (six) hours as needed for up to 5 days for severe  pain., Disp: 20 tablet, Rfl: 0   promethazine (PHENERGAN) 12.5 MG tablet, TAKE 1 TABLET BY MOUTH EVERY 6 HOURS AS NEEDED FOR NAUSEA OR VOMITING., Disp: 30 tablet, Rfl: 0   tiZANidine (ZANAFLEX) 2 MG tablet, TAKE 1 TO 2 TABLETS (2-4 MG TOTAL) BY MOUTH EVERY 6 HOURS AS NEEDED, Disp: 30 tablet, Rfl: 3   traZODone (DESYREL) 100 MG tablet, Take 1 tablet (100 mg total) by mouth at bedtime., Disp: 30 tablet, Rfl: 0   QUEtiapine 150 MG TABS, Take 150 mg by mouth at bedtime., Disp: 30 tablet, Rfl: 1  Past Medical Problems: Past Medical History:  Diagnosis Date   Anemia    Anxiety    COVID-19    Depression    Family history of breast cancer  06/02/2018   Fibrocystic disease of both breasts    History of cervical dysplasia    laser surgery   Hyperglycemia 10/14/2020   Hypokalemia 12/03/2021   Insomnia    Left breast mass 06/30/2008   Low blood sugar 2001   pt checks BS in AM, controls with diet   Need for immunization against influenza 01/12/2022   Routine cervical smear 06/20/2020   Routine health maintenance 05/09/2014   Routine screening for STI (sexually transmitted infection) 01/12/2022   S/P mastectomy, bilateral 10/27/2018   Surgery follow-up 10/19/2018   UTI (urinary tract infection) 11/01/2020   Vaginal Pap smear, abnormal     Past Surgical History: Past Surgical History:  Procedure Laterality Date   AUGMENTATION MAMMAPLASTY Bilateral    BREAST FIBROADENOMA SURGERY  2011, 2008   benign    BREAST RECONSTRUCTION WITH PLACEMENT OF TISSUE EXPANDER AND FLEX HD (ACELLULAR HYDRATED DERMIS) Bilateral 10/19/2018   Procedure: BREAST RECONSTRUCTION WITH PLACEMENT OF TISSUE EXPANDER AND FLEX HD (ACELLULAR HYDRATED DERMIS);  Surgeon: Peggye Form, DO;  Location: [facility];  Service: Plastics;  Laterality: Bilateral;   CESAREAN SECTION  04/22/1993   HYSTEROSCOPY WITH NOVASURE N/A 01/02/2016   Procedure: NOVASURE;  Surgeon: Adam Phenix, MD;  Location: [facility];  Service: Gynecology;  Laterality: N/A;   MASTECTOMY Bilateral 2019   NIPPLE SPARING MASTECTOMY Bilateral 10/19/2018   Procedure: BILATERAL NIPPLE SPARING MASTECTOMY;  Surgeon: Griselda Miner, MD;  Location: [facility];  Service: General;  Laterality: Bilateral;   REMOVAL OF BILATERAL TISSUE EXPANDERS WITH PLACEMENT OF BILATERAL BREAST IMPLANTS Bilateral 12/30/2018   Procedure: REMOVAL OF BILATERAL TISSUE EXPANDERS WITH PLACEMENT OF BILATERAL BREAST IMPLANTS;  Surgeon: Peggye Form, DO;  Location: [facility];  Service: Plastics;  Laterality: Bilateral;   TUBAL LIGATION  04/22/2001    Social History: Social History   Socioeconomic History    Marital status: Divorced    Spouse name: Not on file   Number of children: Not on file   Years of education: Not on file   Highest education level: Not on file  Occupational History   Not on file  Tobacco Use   Smoking status: Never    Passive exposure: Never   Smokeless tobacco: Never  Vaping Use   Vaping Use: Never used  Substance and Sexual Activity   Alcohol use: No    Alcohol/week: 0.0 standard drinks of alcohol   Drug use: No   Sexual activity: Yes    Partners: Male    Birth control/protection: Surgical  Other Topics Concern   Not on file  Social History Narrative   Married, 3 daughters.  Works as a Lawyer at [employer] @ [employer]  Social Determinants of Health   Financial Resource Strain: Low Risk  (07/09/2022)   Overall Financial Resource Strain (CARDIA)    Difficulty of Paying Living Expenses: Not very hard  Food Insecurity: No Food Insecurity (07/09/2022)   Hunger Vital Sign    Worried About Running Out of Food in the Last Year: Never true    Ran Out of Food in the Last Year: Never true  Transportation Needs: No Transportation Needs (08/09/2022)   PRAPARE - Administrator, Civil Service (Medical): No    Lack of Transportation (Non-Medical): No  Recent Concern: Transportation Needs - Unmet Transportation Needs (07/09/2022)   PRAPARE - Administrator, Civil Service (Medical): Yes    Lack of Transportation (Non-Medical): Yes  Physical Activity: Not on file  Stress: Not on file  Social Connections: Not on file  Intimate Partner Violence: Not At Risk (07/09/2022)   Humiliation, Afraid, Rape, and Kick questionnaire    Fear of Current or Ex-Partner: No    Emotionally Abused: No    Physically Abused: No    Sexually Abused: No    Family History: Family History  Problem Relation Age of Onset   Anxiety disorder Father    Depression Father    Diabetes Maternal Grandmother    Hypertension Paternal Grandfather     Cancer Maternal Aunt        breast   Breast cancer Maternal Aunt    Alcohol abuse Maternal Uncle    Alcohol abuse Cousin    Drug abuse Cousin    Stroke Neg Hx     Review of Systems: Review of Systems  Constitutional: Negative.   Respiratory: Negative.    Cardiovascular: Negative.   Gastrointestinal: Negative.   Neurological: Negative.     Physical Exam: Vital Signs BP 128/79 (BP Location: Left Arm, Patient Position: Sitting, Cuff Size: Normal)   Pulse 92   Ht 5\' 4"  (1.626 m)   Wt 155 lb 3.2 oz (70.4 kg)   SpO2 100%   BMI 26.64 kg/m   Physical Exam Constitutional:      General: Not in acute distress.    Appearance: Normal appearance. Not ill-appearing.  HENT:     Head: Normocephalic and atraumatic.  Eyes:     Pupils: Pupils are equal, round Neck:     Musculoskeletal: Normal range of motion.  Cardiovascular:     Rate and Rhythm: Normal rate    Pulses: Normal pulses.  Pulmonary:     Effort: Pulmonary effort is normal. No respiratory distress.  Abdominal:     General: Abdomen is flat. There is no distension.  Musculoskeletal: Normal range of motion.  Skin:    General: Skin is warm and dry.     Findings: No erythema or rash.  Neurological:     General: No focal deficit present.     Mental Status: Alert and oriented to person, place, and time. Mental status is at baseline.     Motor: No weakness.  Psychiatric:        Mood and Affect: Mood normal.        Behavior: Behavior normal.    Assessment/Plan: The patient is scheduled for Removal of bilateral breast implants with Dr. Ulice Bold.  Risks, benefits, and alternatives of procedure discussed, questions answered and consent obtained.    Smoking Status: Non-smoker; Counseling Given?  N/A Patient had MRI September 24, 2022, bilateral mastectomies and bilateral retropectoral implants, no evidence of malignancy.  Caprini Score: 9, high; Risk Factors include:  Age, history of cancer, BMI > 25, aunt with history of DVT and  length of planned surgery. Recommendation for mechanical prophylaxis. Encourage early ambulation.  She does have a family history of DVT in her aunt, but the situation is unclear and may be related to sedentary lifestyle due to her illness per patient.  Will discuss possible need for postoperative blood thinners with Dr. Ulice Bold  Pictures obtained: @consult   Post-op Rx sent to pharmacy: Oxycodone, Zofran  Patient was provided with the General Surgical Risk consent document and Pain Medication Agreement prior to their appointment.  They had adequate time to read through the risk consent documents and Pain Medication Agreement. We also discussed them in person together during this preop appointment. All of their questions were answered to their satisfaction.  Recommended calling if they have any further questions.  Risk consent form and Pain Medication Agreement to be scanned into patient's chart.  Discussed with patient to not use oxycodone and trazodone together due to risk of synergistic effects/sedation.  Patient was agreeable to this.  Electronically signed by: Kermit Balo Jonessa Triplett, PA-C 10/11/2022 9:54 AM

## 2022-10-11 NOTE — H&P (View-Only) (Signed)
   Patient ID: Bethany [name], female    DOB: 09/14/1975, 46 y.o.   MRN: [id number]  Chief Complaint  Patient presents with   Pre-op Exam      ICD-10-CM   1. Breast pain, right  N64.4     2. Adhesive capsulitis of left shoulder  M75.02        History of Present Illness: Bethany [name] is a 46 y.o.  female  with a history of bilateral breast reconstruction.  She presents for preoperative evaluation for upcoming procedure, Removal of bilateral breast implants, scheduled for 10/31/2022 with Dr. Dillingham.  The patient has not had problems with anesthesia. No history of DVT/PE.  Reports her aunt has a history of blood clots in her arms and legs, reports she had Alzheimer's and lives a very sedentary lifestyle.  No other family history of DVT or PE.  No family or personal history of bleeding or clotting disorders.  Patient is not currently taking any blood thinners.  No history of CVA/MI.   Summary of Previous Visit: Patient first seen by Dr. Dillingham February 2020, underwent immediate breast reconstruction with Flex HD and tissue expanders in June 2020.  Had the expanders removed and implants placed September 2020, smooth moderate plus profile extra gel 295 cc implants.  She is having pain, would like to go ahead and have the implants removed.  She does not want implants replaced.  PMH Significant for: Breast reconstruction with breast implants in place, anemia  Patient is requesting to stay overnight at surgical center for observation.  She reports she is feeling well lately.  No recent changes to her health.  She reports that she has pain, mostly in the right breast and would like the implants removed.  She reports in the past she has tolerated oxycodone with use of Benadryl to prevent hives.  She does not have any other reaction with oxycodone or anaphylactic reactions.  She has tolerated Keflex in the past without any issues despite her penicillin allergy.  Past Medical  History: Allergies: Allergies  Allergen Reactions   Lamictal [Lamotrigine] Rash   Codeine Hives   Oxycodone Hcl Hives   Penicillins Hives and Nausea And Vomiting    Current Medications:  Current Outpatient Medications:    acetaminophen (TYLENOL) 650 MG CR tablet, Take 1,300 mg by mouth every 8 (eight) hours. Takes it 2 to 3 times per day, Disp: , Rfl:    cephALEXin (KEFLEX) 500 MG capsule, Take 1 capsule (500 mg total) by mouth 4 (four) times daily for 3 days., Disp: 12 capsule, Rfl: 0   diclofenac Sodium (VOLTAREN) 1 % GEL, APPLY 4 G TOPICALLY 4 TIMES DAILY, Disp: 100 g, Rfl: 1   hydrOXYzine (ATARAX) 50 MG tablet, TAKE 1 TABLET BY MOUTH AT BEDTIME AS NEEDED. MAY TAKE UP TO 3 TIMES DAILY., Disp: 270 tablet, Rfl: 1   LINZESS 145 MCG CAPS capsule, TAKE 1 CAPSULE BY MOUTH DAILY BEFORE BREAKFAST., Disp: 30 capsule, Rfl: 0   lithium carbonate (LITHOBID) 300 MG ER tablet, Take 1 tablet (300 mg total) by mouth 3 (three) times daily., Disp: 90 tablet, Rfl: 0   ondansetron (ZOFRAN) 4 MG tablet, Take 1 tablet (4 mg total) by mouth every 8 (eight) hours as needed for nausea or vomiting., Disp: 20 tablet, Rfl: 0   oxyCODONE (OXY IR/ROXICODONE) 5 MG immediate release tablet, Take 1 tablet (5 mg total) by mouth every 6 (six) hours as needed for up to 5 days for severe   pain., Disp: 20 tablet, Rfl: 0   promethazine (PHENERGAN) 12.5 MG tablet, TAKE 1 TABLET BY MOUTH EVERY 6 HOURS AS NEEDED FOR NAUSEA OR VOMITING., Disp: 30 tablet, Rfl: 0   tiZANidine (ZANAFLEX) 2 MG tablet, TAKE 1 TO 2 TABLETS (2-4 MG TOTAL) BY MOUTH EVERY 6 HOURS AS NEEDED, Disp: 30 tablet, Rfl: 3   traZODone (DESYREL) 100 MG tablet, Take 1 tablet (100 mg total) by mouth at bedtime., Disp: 30 tablet, Rfl: 0   QUEtiapine 150 MG TABS, Take 150 mg by mouth at bedtime., Disp: 30 tablet, Rfl: 1  Past Medical Problems: Past Medical History:  Diagnosis Date   Anemia    Anxiety    COVID-19    Depression    Family history of breast cancer  06/02/2018   Fibrocystic disease of both breasts    History of cervical dysplasia    laser surgery   Hyperglycemia 10/14/2020   Hypokalemia 12/03/2021   Insomnia    Left breast mass 06/30/2008   Low blood sugar 2001   pt checks BS in AM, controls with diet   Need for immunization against influenza 01/12/2022   Routine cervical smear 06/20/2020   Routine health maintenance 05/09/2014   Routine screening for STI (sexually transmitted infection) 01/12/2022   S/P mastectomy, bilateral 10/27/2018   Surgery follow-up 10/19/2018   UTI (urinary tract infection) 11/01/2020   Vaginal Pap smear, abnormal     Past Surgical History: Past Surgical History:  Procedure Laterality Date   AUGMENTATION MAMMAPLASTY Bilateral    BREAST FIBROADENOMA SURGERY  2011, 2008   benign    BREAST RECONSTRUCTION WITH PLACEMENT OF TISSUE EXPANDER AND FLEX HD (ACELLULAR HYDRATED DERMIS) Bilateral 10/19/2018   Procedure: BREAST RECONSTRUCTION WITH PLACEMENT OF TISSUE EXPANDER AND FLEX HD (ACELLULAR HYDRATED DERMIS);  Surgeon: Dillingham, Claire S, DO;  Location: [facility];  Service: Plastics;  Laterality: Bilateral;   CESAREAN SECTION  04/22/1993   HYSTEROSCOPY WITH NOVASURE N/A 01/02/2016   Procedure: NOVASURE;  Surgeon: James G Arnold, MD;  Location: [facility];  Service: Gynecology;  Laterality: N/A;   MASTECTOMY Bilateral 2019   NIPPLE SPARING MASTECTOMY Bilateral 10/19/2018   Procedure: BILATERAL NIPPLE SPARING MASTECTOMY;  Surgeon: Toth, Paul III, MD;  Location: [facility];  Service: General;  Laterality: Bilateral;   REMOVAL OF BILATERAL TISSUE EXPANDERS WITH PLACEMENT OF BILATERAL BREAST IMPLANTS Bilateral 12/30/2018   Procedure: REMOVAL OF BILATERAL TISSUE EXPANDERS WITH PLACEMENT OF BILATERAL BREAST IMPLANTS;  Surgeon: Dillingham, Claire S, DO;  Location: [facility];  Service: Plastics;  Laterality: Bilateral;   TUBAL LIGATION  04/22/2001    Social History: Social History   Socioeconomic History    Marital status: Divorced    Spouse name: Not on file   Number of children: Not on file   Years of education: Not on file   Highest education level: Not on file  Occupational History   Not on file  Tobacco Use   Smoking status: Never    Passive exposure: Never   Smokeless tobacco: Never  Vaping Use   Vaping Use: Never used  Substance and Sexual Activity   Alcohol use: No    Alcohol/week: 0.0 standard drinks of alcohol   Drug use: No   Sexual activity: Yes    Partners: Male    Birth control/protection: Surgical  Other Topics Concern   Not on file  Social History Narrative   Married, 3 daughters.  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           Social Determinants of Health   Financial Resource Strain: Low Risk  (07/09/2022)   Overall Financial Resource Strain (CARDIA)    Difficulty of Paying Living Expenses: Not very hard  Food Insecurity: No Food Insecurity (07/09/2022)   Hunger Vital Sign    Worried About Running Out of Food in the Last Year: Never true    Ran Out of Food in the Last Year: Never true  Transportation Needs: No Transportation Needs (08/09/2022)   PRAPARE - Transportation    Lack of Transportation (Medical): No    Lack of Transportation (Non-Medical): No  Recent Concern: Transportation Needs - Unmet Transportation Needs (07/09/2022)   PRAPARE - Transportation    Lack of Transportation (Medical): Yes    Lack of Transportation (Non-Medical): Yes  Physical Activity: Not on file  Stress: Not on file  Social Connections: Not on file  Intimate Partner Violence: Not At Risk (07/09/2022)   Humiliation, Afraid, Rape, and Kick questionnaire    Fear of Current or Ex-Partner: No    Emotionally Abused: No    Physically Abused: No    Sexually Abused: No    Family History: Family History  Problem Relation Age of Onset   Anxiety disorder Father    Depression Father    Diabetes Maternal Grandmother    Hypertension Paternal Grandfather     Cancer Maternal Aunt        breast   Breast cancer Maternal Aunt    Alcohol abuse Maternal Uncle    Alcohol abuse Cousin    Drug abuse Cousin    Stroke Neg Hx     Review of Systems: Review of Systems  Constitutional: Negative.   Respiratory: Negative.    Cardiovascular: Negative.   Gastrointestinal: Negative.   Neurological: Negative.     Physical Exam: Vital Signs BP 128/79 (BP Location: Left Arm, Patient Position: Sitting, Cuff Size: Normal)   Pulse 92   Ht 5' 4" (1.626 m)   Wt 155 lb 3.2 oz (70.4 kg)   SpO2 100%   BMI 26.64 kg/m   Physical Exam Constitutional:      General: Not in acute distress.    Appearance: Normal appearance. Not ill-appearing.  HENT:     Head: Normocephalic and atraumatic.  Eyes:     Pupils: Pupils are equal, round Neck:     Musculoskeletal: Normal range of motion.  Cardiovascular:     Rate and Rhythm: Normal rate    Pulses: Normal pulses.  Pulmonary:     Effort: Pulmonary effort is normal. No respiratory distress.  Abdominal:     General: Abdomen is flat. There is no distension.  Musculoskeletal: Normal range of motion.  Skin:    General: Skin is warm and dry.     Findings: No erythema or rash.  Neurological:     General: No focal deficit present.     Mental Status: Alert and oriented to person, place, and time. Mental status is at baseline.     Motor: No weakness.  Psychiatric:        Mood and Affect: Mood normal.        Behavior: Behavior normal.    Assessment/Plan: The patient is scheduled for Removal of bilateral breast implants with Dr. Dillingham.  Risks, benefits, and alternatives of procedure discussed, questions answered and consent obtained.    Smoking Status: Non-smoker; Counseling Given?  N/A Patient had MRI September 24, 2022, bilateral mastectomies and bilateral retropectoral implants, no evidence of malignancy.  Caprini Score: 9, high; Risk Factors include:   Age, history of cancer, BMI > 25, aunt with history of DVT and  length of planned surgery. Recommendation for mechanical prophylaxis. Encourage early ambulation.  She does have a family history of DVT in her aunt, but the situation is unclear and may be related to sedentary lifestyle due to her illness per patient.  Will discuss possible need for postoperative blood thinners with Dr. Dillingham  Pictures obtained: @consult  Post-op Rx sent to pharmacy: Oxycodone, Zofran  Patient was provided with the General Surgical Risk consent document and Pain Medication Agreement prior to their appointment.  They had adequate time to read through the risk consent documents and Pain Medication Agreement. We also discussed them in person together during this preop appointment. All of their questions were answered to their satisfaction.  Recommended calling if they have any further questions.  Risk consent form and Pain Medication Agreement to be scanned into patient's chart.  Discussed with patient to not use oxycodone and trazodone together due to risk of synergistic effects/sedation.  Patient was agreeable to this.  Electronically signed by: Jetaime Pinnix J Janzen Sacks, PA-C 10/11/2022 9:54 AM 

## 2022-10-11 NOTE — Telephone Encounter (Signed)
Completed surgical clearance for breast implant removal. Patient is cleared for surgery. NPO without all meds 8 hours prior to procedure. ACS Risk Calculator with risk of any complication 2.5%, below average. No anticoagulants to be held.

## 2022-10-13 ENCOUNTER — Other Ambulatory Visit (HOSPITAL_COMMUNITY): Payer: Self-pay | Admitting: Psychiatry

## 2022-10-13 ENCOUNTER — Other Ambulatory Visit: Payer: Self-pay | Admitting: Family Medicine

## 2022-10-13 DIAGNOSIS — G4700 Insomnia, unspecified: Secondary | ICD-10-CM

## 2022-10-13 DIAGNOSIS — F3160 Bipolar disorder, current episode mixed, unspecified: Secondary | ICD-10-CM

## 2022-10-13 DIAGNOSIS — K5900 Constipation, unspecified: Secondary | ICD-10-CM

## 2022-10-22 ENCOUNTER — Other Ambulatory Visit (HOSPITAL_COMMUNITY): Payer: Self-pay

## 2022-10-22 ENCOUNTER — Other Ambulatory Visit: Payer: Self-pay | Admitting: Family Medicine

## 2022-10-22 ENCOUNTER — Other Ambulatory Visit: Payer: Self-pay

## 2022-10-22 ENCOUNTER — Encounter (HOSPITAL_BASED_OUTPATIENT_CLINIC_OR_DEPARTMENT_OTHER): Payer: Self-pay | Admitting: Plastic Surgery

## 2022-10-22 DIAGNOSIS — F419 Anxiety disorder, unspecified: Secondary | ICD-10-CM

## 2022-10-22 DIAGNOSIS — G4700 Insomnia, unspecified: Secondary | ICD-10-CM

## 2022-10-22 DIAGNOSIS — F3160 Bipolar disorder, current episode mixed, unspecified: Secondary | ICD-10-CM

## 2022-10-22 MED ORDER — TRAZODONE HCL 100 MG PO TABS
100.0000 mg | ORAL_TABLET | Freq: Every day | ORAL | 0 refills | Status: DC
Start: 2022-10-22 — End: 2022-10-30

## 2022-10-22 MED ORDER — QUETIAPINE FUMARATE 150 MG PO TABS
150.0000 mg | ORAL_TABLET | Freq: Every day | ORAL | 0 refills | Status: DC
Start: 2022-10-22 — End: 2022-10-30

## 2022-10-22 NOTE — Progress Notes (Signed)
Patient called for a refill on her Trazodone and Seroquel, patient has an appointment on 7/10. 30 day refill sent in for both medications and reminded patient about her upcoming appointment.

## 2022-10-30 ENCOUNTER — Telehealth (HOSPITAL_BASED_OUTPATIENT_CLINIC_OR_DEPARTMENT_OTHER): Payer: MEDICAID | Admitting: Psychiatry

## 2022-10-30 ENCOUNTER — Encounter (HOSPITAL_COMMUNITY): Payer: Self-pay | Admitting: Psychiatry

## 2022-10-30 ENCOUNTER — Other Ambulatory Visit: Payer: Self-pay | Admitting: Family Medicine

## 2022-10-30 VITALS — Wt 155.0 lb

## 2022-10-30 DIAGNOSIS — G4700 Insomnia, unspecified: Secondary | ICD-10-CM

## 2022-10-30 DIAGNOSIS — F3160 Bipolar disorder, current episode mixed, unspecified: Secondary | ICD-10-CM

## 2022-10-30 DIAGNOSIS — F419 Anxiety disorder, unspecified: Secondary | ICD-10-CM | POA: Diagnosis not present

## 2022-10-30 MED ORDER — LITHIUM CARBONATE ER 300 MG PO TBCR
300.0000 mg | EXTENDED_RELEASE_TABLET | Freq: Three times a day (TID) | ORAL | 1 refills | Status: DC
Start: 2022-10-30 — End: 2022-12-31

## 2022-10-30 MED ORDER — TRAZODONE HCL 100 MG PO TABS: 100.0000 mg | ORAL_TABLET | Freq: Every day | ORAL | 1 refills | Status: DC

## 2022-10-30 MED ORDER — QUETIAPINE FUMARATE 150 MG PO TABS
150.0000 mg | ORAL_TABLET | Freq: Every day | ORAL | 1 refills | Status: DC
Start: 2022-10-30 — End: 2023-01-27

## 2022-10-30 MED ORDER — GABAPENTIN 100 MG PO CAPS: 100.0000 mg | ORAL_CAPSULE | Freq: Three times a day (TID) | ORAL | 1 refills | Status: DC

## 2022-10-30 NOTE — Progress Notes (Signed)
[facility] MD Virtual Progress Note   Patient Location: Home Provider Location: Home Office  I connect with patient by video and verified that I am speaking with correct person by using two identifiers. I discussed the limitations of evaluation and management by telemedicine and the availability of in person appointments. I also discussed with the patient that there may be a patient responsible charge related to this service. The patient expressed understanding and agreed to proceed.  Bethany [name] [id number] 47 y.o.  10/30/2022 2:41 PM  History of Present Illness:  Patient is evaluated by video session.  We ordered lithium level which came back below therapeutic.  Now she is taking lithium 3 times a day.  She noticed improvement in her mood, irritability but is still have regret and ruminative thoughts.  Occasionally seeing demons sometimes in the dog eye but realizes it is imagination.  She also hears some time voices.  She noticed when there is a lack of stimulation and noises she gets more upset and irritable.  Usually daytime is difficult.  She is in therapy that is helping.  She denies any dizziness, fall.  Her physical symptoms are also getting better.  She did physical therapy and not able to walk but is still not driving.  She has an upcoming procedure for removal of breast implant which has been causing issues.  She denies any suicidal thoughts but still have moments of irritability, hopelessness and worthlessness.  Her appetite is okay.  She has no tremor or shakes or any EPS.  This I do think she is taking Seroquel and trazodone.  She denies any major panic attack but reported anxiety during the day is more.  Her mother and her daughter helps her a lot.  Past Psychiatric History: Saw therapist in [facility] after brother killed.  Took Zoloft and lorazepam by PCP.  She took Lamictal but stopped after the rash.  Trazodone helped but wanted to try something for anxiety  and it was switched to hydroxyzine.  No h/o inpatient treatment, suicidal attempt, psychosis or any mania.  H/O irritability, anger and mood swing.  Tried Lamictal but stopped due to rash.    Outpatient Encounter Medications as of 10/30/2022  Medication Sig   acetaminophen (TYLENOL) 650 MG CR tablet Take 1,300 mg by mouth every 8 (eight) hours. Takes it 2 to 3 times per day   diclofenac Sodium (VOLTAREN) 1 % GEL APPLY 4 G TOPICALLY 4 TIMES DAILY   hydrOXYzine (ATARAX) 50 MG tablet TAKE 1 TABLET BY MOUTH AT BEDTIME AS NEEDED. MAY TAKE UP TO 3 TIMES DAILY.   LINZESS 145 MCG CAPS capsule TAKE 1 CAPSULE BY MOUTH EVERY DAY BEFORE BREAKFAST   lithium carbonate (LITHOBID) 300 MG ER tablet Take 1 tablet (300 mg total) by mouth 3 (three) times daily.   ondansetron (ZOFRAN) 4 MG tablet Take 1 tablet (4 mg total) by mouth every 8 (eight) hours as needed for nausea or vomiting.   promethazine (PHENERGAN) 12.5 MG tablet TAKE 1 TABLET BY MOUTH EVERY 6 HOURS AS NEEDED FOR NAUSEA OR VOMITING.   QUEtiapine Fumarate 150 MG TABS Take 150 mg by mouth at bedtime.   tiZANidine (ZANAFLEX) 2 MG tablet TAKE 1 TO 2 TABLETS (2-4 MG TOTAL) BY MOUTH EVERY 6 HOURS AS NEEDED   traZODone (DESYREL) 100 MG tablet Take 1 tablet (100 mg total) by mouth at bedtime.   No facility-administered encounter medications on file as of 10/30/2022.    Recent Results (from  the past 2160 hour(s))  Lithium level     Status: Abnormal   Collection Time: 09/02/22  8:13 AM  Result Value Ref Range   Lithium Lvl 0.3 (L) 0.5 - 1.2 mmol/L    Comment: A concentration of 0.5-0.8 mmol/L is advised for long-term use; concentrations of up to 1.2 mmol/L may be necessary during acute treatment.                                  Detection Limit = 0.1                           <0.1 indicates None Detected      Psychiatric Specialty Exam: Physical Exam  Review of Systems  Weight 155 lb (70.3 kg).There is no height or weight on file to calculate BMI.   General Appearance: Casual  Eye Contact:  Good  Speech:  Slow  Volume:  Decreased  Mood:  Dysphoric  Affect:  Congruent  Thought Process:  Descriptions of Associations: Intact  Orientation:  Full (Time, Place, and Person)  Thought Content:  Hallucinations: Auditory Visual Seeing demons in dog, Paranoid Ideation, and Rumination  Suicidal Thoughts:  No  Homicidal Thoughts:  No  Memory:  Immediate;   Fair Recent;   Fair Remote;   Fair  Judgement:  Fair  Insight:  Present  Psychomotor Activity:  Decreased  Concentration:  Concentration: Fair and Attention Span: Fair  Recall:  Fair  Fund of Knowledge:  Good  Language:  Good  Akathisia:  No  Handed:  Right  AIMS (if indicated):     Assets:  Communication Skills Desire for Improvement Housing Social Support  ADL's:  Intact  Cognition:  Impaired,  Mild  Sleep:  fair     Assessment/Plan: Mixed bipolar I disorder (HCC) - Plan: lithium carbonate (LITHOBID) 300 MG ER tablet, traZODone (DESYREL) 100 MG tablet, QUEtiapine Fumarate 150 MG TABS, gabapentin (NEURONTIN) 100 MG capsule  Insomnia, unspecified type - Plan: traZODone (DESYREL) 100 MG tablet  Anxiety - Plan: QUEtiapine Fumarate 150 MG TABS, gabapentin (NEURONTIN) 100 MG capsule  Patient doing better since lithium dose optimized.  I reviewed blood work results.  Her level is 0.3.  She did physical rehab and her energy level is improved.  She is taking hydroxyzine 50 mg 3 times a day which does not help as much.  I recommend she can try gabapentin 100 mg 3 times a day to help with anxiety and try not to take the hydroxyzine.  Encouraged to continue therapy with Ms. Lucretia Field.  Continue lithium 300 mg 3 times a day, Seroquel 150 mg at bedtime and trazodone 100 mg at bedtime.  Recommended to call us back if she has any question or any concern.  Follow-up in 2 months.  I explained she need to monitor closely her medicine as she has history of dizziness and she need to watch carefully  changing her posture.   Follow Up Instructions:     I discussed the assessment and treatment plan with the patient. The patient was provided an opportunity to ask questions and all were answered. The patient agreed with the plan and demonstrated an understanding of the instructions.   The patient was advised to call back or seek an in-person evaluation if the symptoms worsen or if the condition fails to improve as anticipated.    Collaboration of Care: Other provider  involved in patient's care AEB notes are available in epic to review.  Patient/Guardian was advised Release of Information must be obtained prior to any record release in order to collaborate their care with an outside provider. Patient/Guardian was advised if they have not already done so to contact the registration department to sign all necessary forms in order for [name] to release information regarding their care.   Consent: Patient/Guardian gives verbal consent for treatment and assignment of benefits for services provided during this visit. Patient/Guardian expressed understanding and agreed to proceed.     I provided 28 minutes of non face to face time during this encounter.  Note: This document was prepared by [employer] voice dictation technology and any errors that results from this process are unintentional.    Cleotis Nipper, MD 10/30/2022

## 2022-10-30 NOTE — Progress Notes (Signed)
Patient is a pleasant 47 year old female s/p removal of bilateral breast implants performed 10/31/2022 by Dr. Ulice Bold who joins via telephone for postoperative day 1 check-in.  Reviewed operative report and Exparel was injected into the fascia at time of her Mentor implant removal and capsular release.  The incision was closed with 4-0 Monocryl subcuticular stitches followed by Dermabond and Steri-Strips.  The patient was at home and this provider was calling from their office.  A total of 10 minutes was spent speaking with the patient and reviewing chart.    Today, patient is doing okay.  She states that she has soreness bilaterally, but no significant pain or asymmetric swelling.  She states that her drain output has been approximately 35 cc/day from the left side and 70 cc/day from the right side.  She is tolerating p.o. intake well, voiding.  No BM yet.  Ambulatory.  Emphasized the importance of oral hydration.  She denies any leg swelling, chest pain, fevers, or other symptoms.  Plan for her to follow-up next week in clinic as scheduled.  She will call the clinic should she have any questions or concerns in the interim.

## 2022-10-30 NOTE — Anesthesia Preprocedure Evaluation (Signed)
Anesthesia Evaluation  Patient identified by MRN, date of birth, ID band Patient awake    Reviewed: Allergy & Precautions, NPO status , Patient's Chart, lab work & pertinent test results  History of Anesthesia Complications Negative for: history of anesthetic complications  Airway Mallampati: II  TM Distance: >3 FB Neck ROM: Full    Dental  (+) Missing,    Pulmonary neg pulmonary ROS   Pulmonary exam normal        Cardiovascular negative cardio ROS Normal cardiovascular exam     Neuro/Psych  Headaches  Anxiety Depression Bipolar Disorder      GI/Hepatic negative GI ROS, Neg liver ROS,,,  Endo/Other  negative endocrine ROS    Renal/GU negative Renal ROS     Musculoskeletal negative musculoskeletal ROS (+)    Abdominal   Peds  Hematology  (+) Blood dyscrasia, anemia   Anesthesia Other Findings Day of surgery medications reviewed with patient.  Reproductive/Obstetrics                              Anesthesia Physical Anesthesia Plan  ASA: 2  Anesthesia Plan: General   Post-op Pain Management: Tylenol PO (pre-op)*   Induction: Intravenous  PONV Risk Score and Plan: 3 and Treatment may vary due to age or medical condition, Midazolam, Dexamethasone and Ondansetron  Airway Management Planned: LMA  Additional Equipment: None  Intra-op Plan:   Post-operative Plan: Extubation in OR  Informed Consent: I have reviewed the patients History and Physical, chart, labs and discussed the procedure including the risks, benefits and alternatives for the proposed anesthesia with the patient or authorized representative who has indicated his/her understanding and acceptance.     Dental advisory given  Plan Discussed with: CRNA  Anesthesia Plan Comments:          Anesthesia Quick Evaluation

## 2022-10-31 ENCOUNTER — Ambulatory Visit (HOSPITAL_BASED_OUTPATIENT_CLINIC_OR_DEPARTMENT_OTHER): Payer: MEDICAID | Admitting: Anesthesiology

## 2022-10-31 ENCOUNTER — Encounter (HOSPITAL_BASED_OUTPATIENT_CLINIC_OR_DEPARTMENT_OTHER)
Admission: RE | Disposition: A | Discharge status: Home / Self Care | Payer: Self-pay | Source: Home / Self Care | Attending: Plastic Surgery

## 2022-10-31 ENCOUNTER — Other Ambulatory Visit: Payer: Self-pay

## 2022-10-31 ENCOUNTER — Encounter (HOSPITAL_BASED_OUTPATIENT_CLINIC_OR_DEPARTMENT_OTHER): Payer: Self-pay | Admitting: Plastic Surgery

## 2022-10-31 ENCOUNTER — Telehealth: Payer: Self-pay | Admitting: *Deleted

## 2022-10-31 ENCOUNTER — Ambulatory Visit (HOSPITAL_BASED_OUTPATIENT_CLINIC_OR_DEPARTMENT_OTHER)
Admission: RE | Admit: 2022-10-31 | Discharge: 2022-10-31 | Disposition: A | Discharge status: Home / Self Care | Payer: MEDICAID | Attending: Plastic Surgery | Admitting: Plastic Surgery

## 2022-10-31 DIAGNOSIS — Z853 Personal history of malignant neoplasm of breast: Secondary | ICD-10-CM | POA: Diagnosis not present

## 2022-10-31 DIAGNOSIS — Z45811 Encounter for adjustment or removal of right breast implant: Secondary | ICD-10-CM | POA: Insufficient documentation

## 2022-10-31 DIAGNOSIS — Z45812 Encounter for adjustment or removal of left breast implant: Secondary | ICD-10-CM | POA: Diagnosis not present

## 2022-10-31 DIAGNOSIS — F418 Other specified anxiety disorders: Secondary | ICD-10-CM | POA: Insufficient documentation

## 2022-10-31 DIAGNOSIS — Z9013 Acquired absence of bilateral breasts and nipples: Secondary | ICD-10-CM | POA: Insufficient documentation

## 2022-10-31 DIAGNOSIS — F319 Bipolar disorder, unspecified: Secondary | ICD-10-CM | POA: Diagnosis not present

## 2022-10-31 DIAGNOSIS — Z01818 Encounter for other preprocedural examination: Secondary | ICD-10-CM

## 2022-10-31 DIAGNOSIS — Z421 Encounter for breast reconstruction following mastectomy: Secondary | ICD-10-CM | POA: Diagnosis not present

## 2022-10-31 HISTORY — PX: BREAST IMPLANT REMOVAL: SHX5361

## 2022-10-31 LAB — POCT PREGNANCY, URINE: Preg Test, Ur: NEGATIVE

## 2022-10-31 SURGERY — REMOVAL, IMPLANT, BREAST
Anesthesia: General | Site: Breast | Laterality: Bilateral

## 2022-10-31 MED ORDER — DEXAMETHASONE SODIUM PHOSPHATE 4 MG/ML IJ SOLN
INTRAMUSCULAR | Status: DC | PRN
  Administered 2022-10-31: 5 mg via INTRAVENOUS

## 2022-10-31 MED ORDER — PHENYLEPHRINE HCL (PRESSORS) 10 MG/ML IV SOLN
INTRAVENOUS | Status: AC
  Filled 2022-10-31: qty 1

## 2022-10-31 MED ORDER — SCOPOLAMINE 1 MG/3DAYS TD PT72
MEDICATED_PATCH | TRANSDERMAL | Status: AC
  Filled 2022-10-31: qty 1

## 2022-10-31 MED ORDER — CHLORHEXIDINE GLUCONATE CLOTH 2 % EX PADS: 6.0000 | MEDICATED_PAD | Freq: Once | CUTANEOUS | Status: DC

## 2022-10-31 MED ORDER — HYDROMORPHONE HCL 1 MG/ML IJ SOLN
INTRAMUSCULAR | Status: AC
  Filled 2022-10-31: qty 0.5

## 2022-10-31 MED ORDER — EPHEDRINE SULFATE (PRESSORS) 50 MG/ML IJ SOLN
INTRAMUSCULAR | Status: DC | PRN
  Administered 2022-10-31: 10 mg via INTRAVENOUS

## 2022-10-31 MED ORDER — ACETAMINOPHEN 500 MG PO TABS
1000.0000 mg | ORAL_TABLET | Freq: Once | ORAL | Status: AC
  Administered 2022-10-31: 1000 mg via ORAL

## 2022-10-31 MED ORDER — SODIUM CHLORIDE 0.9% FLUSH: 3.0000 mL | Freq: Two times a day (BID) | INTRAVENOUS | Status: DC

## 2022-10-31 MED ORDER — SCOPOLAMINE 1 MG/3DAYS TD PT72
1.0000 | MEDICATED_PATCH | Freq: Once | TRANSDERMAL | Status: DC
  Administered 2022-10-31: 1.5 mg via TRANSDERMAL

## 2022-10-31 MED ORDER — LIDOCAINE HCL (CARDIAC) PF 100 MG/5ML IV SOSY
PREFILLED_SYRINGE | INTRAVENOUS | Status: DC | PRN
  Administered 2022-10-31: 50 mg via INTRAVENOUS

## 2022-10-31 MED ORDER — SODIUM CHLORIDE 0.9% FLUSH: 3.0000 mL | INTRAVENOUS | Status: DC | PRN

## 2022-10-31 MED ORDER — EPHEDRINE 5 MG/ML INJ
INTRAVENOUS | Status: AC
  Filled 2022-10-31: qty 5

## 2022-10-31 MED ORDER — MIDAZOLAM HCL 5 MG/5ML IJ SOLN
INTRAMUSCULAR | Status: DC | PRN
  Administered 2022-10-31: 2 mg via INTRAVENOUS

## 2022-10-31 MED ORDER — FENTANYL CITRATE (PF) 100 MCG/2ML IJ SOLN
INTRAMUSCULAR | Status: AC
  Filled 2022-10-31: qty 2

## 2022-10-31 MED ORDER — LIDOCAINE 2% (20 MG/ML) 5 ML SYRINGE
INTRAMUSCULAR | Status: AC
  Filled 2022-10-31: qty 5

## 2022-10-31 MED ORDER — PROPOFOL 10 MG/ML IV BOLUS
INTRAVENOUS | Status: AC
  Filled 2022-10-31: qty 20

## 2022-10-31 MED ORDER — ACETAMINOPHEN 325 MG PO TABS: 650.0000 mg | ORAL_TABLET | ORAL | Status: DC | PRN

## 2022-10-31 MED ORDER — FENTANYL CITRATE (PF) 100 MCG/2ML IJ SOLN
INTRAMUSCULAR | Status: DC | PRN
  Administered 2022-10-31 (×2): 50 ug via INTRAVENOUS

## 2022-10-31 MED ORDER — ONDANSETRON HCL 4 MG/2ML IJ SOLN
INTRAMUSCULAR | Status: AC
  Filled 2022-10-31: qty 2

## 2022-10-31 MED ORDER — KETOROLAC TROMETHAMINE 30 MG/ML IJ SOLN
INTRAMUSCULAR | Status: AC
  Filled 2022-10-31: qty 1

## 2022-10-31 MED ORDER — FENTANYL CITRATE (PF) 100 MCG/2ML IJ SOLN: 25.0000 ug | INTRAMUSCULAR | Status: DC | PRN

## 2022-10-31 MED ORDER — CIPROFLOXACIN IN D5W 400 MG/200ML IV SOLN
400.0000 mg | INTRAVENOUS | Status: AC
  Administered 2022-10-31: 400 mg via INTRAVENOUS

## 2022-10-31 MED ORDER — ONDANSETRON HCL 4 MG/2ML IJ SOLN
INTRAMUSCULAR | Status: DC | PRN
  Administered 2022-10-31: 4 mg via INTRAVENOUS

## 2022-10-31 MED ORDER — HYDROMORPHONE HCL 1 MG/ML IJ SOLN
0.2500 mg | INTRAMUSCULAR | Status: DC | PRN
  Administered 2022-10-31 (×2): 0.5 mg via INTRAVENOUS

## 2022-10-31 MED ORDER — SODIUM CHLORIDE 0.9 % IV SOLN: 250.0000 mL | INTRAVENOUS | Status: DC | PRN

## 2022-10-31 MED ORDER — ACETAMINOPHEN 325 MG RE SUPP: 650.0000 mg | RECTAL | Status: DC | PRN

## 2022-10-31 MED ORDER — CIPROFLOXACIN IN D5W 400 MG/200ML IV SOLN
INTRAVENOUS | Status: AC
  Filled 2022-10-31: qty 200

## 2022-10-31 MED ORDER — BUPIVACAINE LIPOSOME 1.3 % IJ SUSP
INTRAMUSCULAR | Status: AC
  Filled 2022-10-31: qty 20

## 2022-10-31 MED ORDER — AMISULPRIDE (ANTIEMETIC) 5 MG/2ML IV SOLN: 10.0000 mg | Freq: Once | INTRAVENOUS | Status: DC | PRN

## 2022-10-31 MED ORDER — LIDOCAINE-EPINEPHRINE 1 %-1:100000 IJ SOLN
INTRAMUSCULAR | Status: AC
  Filled 2022-10-31: qty 1

## 2022-10-31 MED ORDER — MIDAZOLAM HCL 2 MG/2ML IJ SOLN
INTRAMUSCULAR | Status: AC
  Filled 2022-10-31: qty 2

## 2022-10-31 MED ORDER — LACTATED RINGERS IV SOLN: INTRAVENOUS | Status: DC

## 2022-10-31 MED ORDER — ACETAMINOPHEN 500 MG PO TABS
ORAL_TABLET | ORAL | Status: AC
  Filled 2022-10-31: qty 2

## 2022-10-31 MED ORDER — SODIUM CHLORIDE 0.9 % IV SOLN
INTRAVENOUS | Status: DC | PRN
  Administered 2022-10-31: 20 mL

## 2022-10-31 MED ORDER — BUPIVACAINE HCL (PF) 0.25 % IJ SOLN
INTRAMUSCULAR | Status: AC
  Filled 2022-10-31: qty 30

## 2022-10-31 MED ORDER — PROPOFOL 10 MG/ML IV BOLUS
INTRAVENOUS | Status: DC | PRN
  Administered 2022-10-31: 160 mg via INTRAVENOUS

## 2022-10-31 MED ORDER — DEXAMETHASONE SODIUM PHOSPHATE 10 MG/ML IJ SOLN
INTRAMUSCULAR | Status: AC
  Filled 2022-10-31: qty 1

## 2022-10-31 SURGICAL SUPPLY — 73 items
ADH SKN CLS APL DERMABOND .7 (GAUZE/BANDAGES/DRESSINGS)
BAG DECANTER FOR FLEXI CONT (MISCELLANEOUS) ×1 IMPLANT
BINDER BREAST LRG (GAUZE/BANDAGES/DRESSINGS) IMPLANT
BINDER BREAST MEDIUM (GAUZE/BANDAGES/DRESSINGS) IMPLANT
BINDER BREAST XLRG (GAUZE/BANDAGES/DRESSINGS) IMPLANT
BINDER BREAST XXLRG (GAUZE/BANDAGES/DRESSINGS) IMPLANT
BIOPATCH RED 1 DISK 7.0 (GAUZE/BANDAGES/DRESSINGS) IMPLANT
BLADE HEX COATED 2.75 (ELECTRODE) ×1 IMPLANT
BLADE SURG 15 STRL LF DISP TIS (BLADE) ×1 IMPLANT
BLADE SURG 15 STRL SS (BLADE) ×1
BNDG GAUZE DERMACEA FLUFF 4 (GAUZE/BANDAGES/DRESSINGS) ×2 IMPLANT
BNDG GZE DERMACEA 4 6PLY (GAUZE/BANDAGES/DRESSINGS) ×2
CANISTER SUCT 1200ML W/VALVE (MISCELLANEOUS) ×1 IMPLANT
CLEANSER WND VASHE INSTL 34OZ (WOUND CARE) IMPLANT
COVER BACK TABLE 60X90IN (DRAPES) ×1 IMPLANT
COVER MAYO STAND STRL (DRAPES) ×1 IMPLANT
DERMABOND ADVANCED .7 DNX12 (GAUZE/BANDAGES/DRESSINGS) IMPLANT
DRAIN CHANNEL 19F RND (DRAIN) IMPLANT
DRAIN RELI 100 BL SUC LF ST (DRAIN)
DRAPE LAPAROSCOPIC ABDOMINAL (DRAPES) ×1 IMPLANT
DRESSING MEPILEX FLEX 4X4 (GAUZE/BANDAGES/DRESSINGS) IMPLANT
DRSG MEPILEX FLEX 4X4 (GAUZE/BANDAGES/DRESSINGS) ×2
DRSG TEGADERM 4X4.75 (GAUZE/BANDAGES/DRESSINGS) IMPLANT
ELECT BLADE 4.0 EZ CLEAN MEGAD (MISCELLANEOUS)
ELECT BLADE 6.5 EXT (BLADE) IMPLANT
ELECT REM PT RETURN 9FT ADLT (ELECTROSURGICAL) ×1
ELECTRODE BLDE 4.0 EZ CLN MEGD (MISCELLANEOUS) IMPLANT
ELECTRODE REM PT RTRN 9FT ADLT (ELECTROSURGICAL) ×1 IMPLANT
EVACUATOR SILICONE 100CC (DRAIN) IMPLANT
GAUZE PAD ABD 8X10 STRL (GAUZE/BANDAGES/DRESSINGS) ×1 IMPLANT
GLOVE BIO SURGEON STRL SZ 6.5 (GLOVE) ×1 IMPLANT
GOWN STRL REUS W/ TWL LRG LVL3 (GOWN DISPOSABLE) ×2 IMPLANT
GOWN STRL REUS W/ TWL XL LVL3 (GOWN DISPOSABLE) IMPLANT
GOWN STRL REUS W/TWL LRG LVL3 (GOWN DISPOSABLE) ×3
GOWN STRL REUS W/TWL XL LVL3 (GOWN DISPOSABLE) ×1
IV NS 1000ML (IV SOLUTION)
IV NS 1000ML BAXH (IV SOLUTION) IMPLANT
IV NS 500ML (IV SOLUTION)
IV NS 500ML BAXH (IV SOLUTION) IMPLANT
KIT FILL ASEPTIC TRANSFER (MISCELLANEOUS) IMPLANT
NDL HYPO 25X1 1.5 SAFETY (NEEDLE) IMPLANT
NDL SAFETY ECLIP 18X1.5 (MISCELLANEOUS) IMPLANT
NEEDLE HYPO 25X1 1.5 SAFETY (NEEDLE) IMPLANT
NS IRRIG 1000ML POUR BTL (IV SOLUTION) IMPLANT
PACK BASIN DAY SURGERY FS (CUSTOM PROCEDURE TRAY) ×1 IMPLANT
PENCIL SMOKE EVACUATOR (MISCELLANEOUS) ×1 IMPLANT
PIN SAFETY STERILE (MISCELLANEOUS) IMPLANT
SLEEVE SCD COMPRESS KNEE MED (STOCKING) ×1 IMPLANT
SPIKE FLUID TRANSFER (MISCELLANEOUS) IMPLANT
SPONGE T-LAP 18X18 ~~LOC~~+RFID (SPONGE) ×2 IMPLANT
STRIP SUTURE WOUND CLOSURE 1/2 (MISCELLANEOUS) IMPLANT
SUT MNCRL AB 4-0 PS2 18 (SUTURE) IMPLANT
SUT MON AB 3-0 SH 27 (SUTURE) ×2
SUT MON AB 3-0 SH27 (SUTURE) ×1 IMPLANT
SUT MON AB 5-0 PS2 18 (SUTURE) ×1 IMPLANT
SUT PDS 3-0 CT2 (SUTURE) ×2
SUT PDS AB 2-0 CT2 27 (SUTURE) IMPLANT
SUT PDS II 3-0 CT2 27 ABS (SUTURE) IMPLANT
SUT PROLENE 3 0 PS 2 (SUTURE) IMPLANT
SUT SILK 3 0 PS 1 (SUTURE) IMPLANT
SUT VIC AB 3-0 SH 27 (SUTURE)
SUT VIC AB 3-0 SH 27X BRD (SUTURE) IMPLANT
SUT VIC AB 4-0 PS2 18 (SUTURE) IMPLANT
SWAB COLLECTION DEVICE MRSA (MISCELLANEOUS) IMPLANT
SWAB CULTURE ESWAB REG 1ML (MISCELLANEOUS) IMPLANT
SYR 50ML LL SCALE MARK (SYRINGE) IMPLANT
SYR BULB IRRIG 60ML STRL (SYRINGE) ×1 IMPLANT
SYR CONTROL 10ML LL (SYRINGE) IMPLANT
TOWEL GREEN STERILE FF (TOWEL DISPOSABLE) ×2 IMPLANT
TRAY DSU PREP LF (CUSTOM PROCEDURE TRAY) ×1 IMPLANT
TUBE CONNECTING 20X1/4 (TUBING) ×1 IMPLANT
UNDERPAD 30X36 HEAVY ABSORB (UNDERPADS AND DIAPERS) ×2 IMPLANT
YANKAUER SUCT BULB TIP NO VENT (SUCTIONS) ×1 IMPLANT

## 2022-10-31 NOTE — Telephone Encounter (Signed)
Received on (10/29/2022)  via of fax DME Standard Written Order from [facility] [place] requesting signature and return.  Given to provider to sign.  DME Standard Written Order signed,dated, and faxed back to [facility] [place].  Confirmation received and copy scanned into the chart.//AB/CMA

## 2022-10-31 NOTE — Op Note (Signed)
Op report   DATE OF OPERATION: 10/31/2022  LOCATION: [facility]  SURGICAL DIVISION: Plastic Surgery  PREOPERATIVE DIAGNOSIS:  1.History of breast cancer.  2. Acquired absence of bilateral breast.   POSTOPERATIVE DIAGNOSIS:  1. History of breast cancer.  2. Acquired absence of bilateral breast.   PROCEDURE:  1. Bilateral removal of breast implants.  2. Bilateral complete capsulectomy.  SURGEON: Foster Simpson, DO  ASSISTANT: Keenan Bachelor, PA  ANESTHESIA:  General.   COMPLICATIONS: None.   INDICATIONS FOR PROCEDURE:  The patient, Bethany [name], is a 47 y.o. female born on March 27, 1976, is here for treatment after bilateral mastectomies.  She had tissue expanders placed at the time of mastectomies and then implants. She now presents for removal of the implants as she has decided to not continue with the implants.  We will plan to remove the capsules as well. MRN: [id number]  CONSENT:  Informed consent was obtained directly from the patient. Risks, benefits and alternatives were fully discussed. Specific risks including but not limited to bleeding, infection, hematoma, seroma, scarring, pain, implant infection, implant extrusion, capsular contracture, asymmetry, wound healing problems, and need for further surgery were all discussed. The patient did have an ample opportunity to have her questions answered to her satisfaction.   DESCRIPTION OF PROCEDURE:  The patient was taken to the operating room. SCDs were placed and IV antibiotics were given. The patient's chest was prepped and draped in a sterile fashion. A time out was performed and the implants to be used were identified.    Right breast: Local with epinephrine was used to infiltrate at the incision site. The old mastectomy scar was incised.  The mastectomy flaps from the superior and inferior flaps were raised. The capsule was release throughout the pocket.  The implant was removed.  The capsule  was then removed in total.  Experel was placed in the fascia. The pocket was irrigated with Vashe.hemostasis was achieved with electrocautery.  A drain was placed and secured to the skin with the 3-0 Silk. New gloves were placed. The deep layer was closed with a 3-0 PDS. The remaining skin was closed with 3-0 Monocryl deep dermal and 4-0 Monocryl subcuticular stitches.   Left breast: Right breast: Local with epinephrine was used to infiltrate at the incision site. The old mastectomy scar was incised.  The mastectomy flaps from the superior and inferior flaps were raised. The capsule was release throughout the pocket.  The implant was removed.  The capsule was then removed in total.  Hemostasis was achieved with electrocautery. Experel was placed in the fascia. The pocket was irrigated with Vashe. A drain was placed and secured to the skin with the 3-0 Silk. New gloves were placed. The deep layer was closed with a 3-0 PDS. The remaining skin was closed with 3-0 Monocryl deep dermal and 4-0 Monocryl subcuticular stitches.   Dermabond was applied to the incision site. A breast binder and ABDs were placed.  The patient was allowed to wake from anesthesia and taken to the recovery room in satisfactory condition.   The advanced practice practitioner (APP) assisted throughout the case.  The APP was essential in retraction and counter traction when needed to make the case progress smoothly.  This retraction and assistance made it possible to see the tissue plans for the procedure.  The assistance was needed for blood control, tissue re-approximation and assisted with closure of the incision site.

## 2022-10-31 NOTE — Transfer of Care (Signed)
Immediate Anesthesia Transfer of Care Note  Patient: Bethany [name]  Procedure(s) Performed: REMOVAL BREAST IMPLANTS (Bilateral: Breast)  Patient Location: PACU  Anesthesia Type:General  Level of Consciousness: awake, alert , and oriented  Airway & Oxygen Therapy: Patient Spontanous Breathing and Patient connected to face mask oxygen  Post-op Assessment: Report given to RN and Post -op Vital signs reviewed and stable  Post vital signs: Reviewed and stable  Last Vitals:  Vitals Value Taken Time  BP 117/75 10/31/22 1139  Temp    Pulse 84 10/31/22 1142  Resp 12 10/31/22 1142  SpO2 98 % 10/31/22 1142  Vitals shown include unfiled device data.  Last Pain:  Vitals:   10/31/22 0747  TempSrc: Oral  PainSc: 7          Complications: No notable events documented.

## 2022-10-31 NOTE — Anesthesia Procedure Notes (Signed)
Procedure Name: LMA Insertion Date/Time: 10/31/2022 10:27 AM  Performed by: Cleda Clarks, CRNAPre-anesthesia Checklist: Patient identified, Emergency Drugs available, Suction available and Patient being monitored Patient Re-evaluated:Patient Re-evaluated prior to induction Oxygen Delivery Method: Circle system utilized Preoxygenation: Pre-oxygenation with 100% oxygen Induction Type: IV induction Ventilation: Mask ventilation without difficulty LMA: LMA inserted LMA Size: 4.0 Number of attempts: 1 Placement Confirmation: positive ETCO2 Tube secured with: Tape Dental Injury: Teeth and Oropharynx as per pre-operative assessment

## 2022-10-31 NOTE — Interval H&P Note (Signed)
History and Physical Interval Note:  10/31/2022 9:51 AM  Bethany [name]  has presented today for surgery, with the diagnosis of Breast pain.  The various methods of treatment have been discussed with the patient and family. After consideration of risks, benefits and other options for treatment, the patient has consented to  Procedure(s): REMOVAL BREAST IMPLANTS (Bilateral) as a surgical intervention.  The patient's history has been reviewed, patient examined, no change in status, stable for surgery.  I have reviewed the patient's chart and labs.  Questions were answered to the patient's satisfaction.     Alena Bills Nalaya Wojdyla

## 2022-10-31 NOTE — Anesthesia Postprocedure Evaluation (Signed)
Anesthesia Post Note  Patient: Bethany [name]  Procedure(s) Performed: REMOVAL BREAST IMPLANTS (Bilateral: Breast)     Patient location during evaluation: PACU Anesthesia Type: General Level of consciousness: awake and alert Pain management: pain level controlled Vital Signs Assessment: post-procedure vital signs reviewed and stable Respiratory status: spontaneous breathing, nonlabored ventilation and respiratory function stable Cardiovascular status: blood pressure returned to baseline Postop Assessment: no apparent nausea or vomiting Anesthetic complications: no   No notable events documented.  Last Vitals:  Vitals:   10/31/22 1200 10/31/22 1215  BP: 126/79 120/67  Pulse: 90 82  Resp: 16 13  Temp:    SpO2: 94% 97%    Last Pain:  Vitals:   10/31/22 1215  TempSrc:   PainSc: 6                  Shanda Howells

## 2022-10-31 NOTE — Discharge Instructions (Addendum)
INSTRUCTIONS FOR AFTER BREAST SURGERY   You will likely have some questions about what to expect following your operation.  The following information will help you and your family understand what to expect when you are discharged from the hospital.  It is important to follow these guidelines to help ensure a smooth recovery and reduce complication.  Postoperative instructions include information on: diet, wound care, medications and physical activity.  AFTER SURGERY Expect to go home after the procedure.  In some cases, you may need to spend one night in the hospital for observation.  DIET Breast surgery does not require a specific diet.  However, the healthier you eat the better your body will heal. It is important to increasing your protein intake.  This means limiting the foods with sugar and carbohydrates.  Focus on vegetables and some meat.  If you have liposuction during your procedure be sure to drink water.  If your urine is bright yellow, then it is concentrated, and you need to drink more water.  As a general rule after surgery, you should have 8 ounces of water every hour while awake.  If you find you are persistently nauseated or unable to take in liquids let us know.  NO TOBACCO USE or EXPOSURE.  This will slow your healing process and lead to a wound.  WOUND CARE Leave the binder on for 3 days . Use fragrance free soap like Dial, Dove or [place].   After 3 days you can remove the binder to shower. Once dry apply binder or sports bra. If you have liposuction you will have a soft and spongy dressing (Lipofoam) that helps prevent creases in your skin.  Remove before you shower and then replace it.  It is also available on [employer]. If you have steri-strips / tape directly attached to your skin leave them in place. It is OK to get these wet.   No baths, pools or hot tubs for four weeks. We close your incision to leave the smallest and best-looking scar. No ointment or creams on your incisions  for four weeks.  No Neosporin (Too many skin reactions).  A few weeks after surgery you can use Mederma and start massaging the scar. We ask you to wear your binder or sports bra for the first 6 weeks around the clock, including while sleeping. This provides added comfort and helps reduce the fluid accumulation at the surgery site. NO Ice or heating pads to the operative site.  You have a very high risk of a BURN before you feel the temperature change.  ACTIVITY No heavy lifting until cleared by the doctor.  This usually means no more than a half-gallon of milk.  It is OK to walk and climb stairs. Moving your legs is very important to decrease your risk of a blood clot.  It will also help keep you from getting deconditioned.  Every 1 to 2 hours get up and walk for 5 minutes. This will help with a quicker recovery back to normal.  Let pain be your guide so you don't do too much.  This time is for you to recover.  You will be more comfortable if you sleep and rest with your head elevated either with a few pillows under you or in a recliner.  No stomach sleeping for a three months.  WORK Everyone returns to work at different times. As a rough guide, most people take at least 1 - 2 weeks off prior to returning to work. If  you need documentation for your job, give the forms to the front staff at the clinic.  DRIVING Arrange for someone to bring you home from the hospital after your surgery.  You may be able to drive a few days after surgery but not while taking any narcotics or valium.  BOWEL MOVEMENTS Constipation can occur after anesthesia and while taking pain medication.  It is important to stay ahead for your comfort.  We recommend taking Milk of Magnesia (2 tablespoons; twice a day) while taking the pain pills.  MEDICATIONS You may be prescribed should start after surgery At your preoperative visit for you history and physical you may have been given the following medications: An antibiotic: Start  this medication when you get home and take according to the instructions on the bottle. Zofran 4 mg:  This is to treat nausea and vomiting.  You can take this every 6 hours as needed and only if needed. Valium 2 mg for breast cancer patients: This is for muscle tightness if you have an implant or expander. This will help relax your muscle which also helps with pain control.  This can be taken every 12 hours as needed. Don't drive after taking this medication. Norco (hydrocodone/acetaminophen) 5/325 mg:  This is only to be used after you have taken the Motrin or the Tylenol. Every 8 hours as needed.   Over the counter Medication to take: Ibuprofen (Motrin) 600 mg:  Take this every 6 hours.  If you have additional pain then take 500 mg of the Tylenol every 8 hours.  Only take the Norco after you have tried these two. MiraLAX or Milk of Magnesia: Take this according to the bottle if you take the Norco.  WHEN TO CALL Call your surgeon's office if any of the following occur: Fever 101 degrees F or greater Excessive bleeding or fluid from the incision site. Pain that increases over time without aid from the medications Redness, warmth, or pus draining from incision sites Persistent nausea or inability to take in liquids Severe misshapen area that underwent the operation.   Post Anesthesia Home Care Instructions  Activity: Get plenty of rest for the remainder of the day. A responsible individual must stay with you for 24 hours following the procedure.  For the next 24 hours, DO NOT: -Drive a car -Advertising copywriter -Drink alcoholic beverages -Take any medication unless instructed by your physician -Make any legal decisions or sign important papers.  Meals: Start with liquid foods such as gelatin or soup. Progress to regular foods as tolerated. Avoid greasy, spicy, heavy foods. If nausea and/or vomiting occur, drink only clear liquids until the nausea and/or vomiting subsides. Call your  physician if vomiting continues.  Special Instructions/Symptoms: Your throat may feel dry or sore from the anesthesia or the breathing tube placed in your throat during surgery. If this causes discomfort, gargle with warm salt water. The discomfort should disappear within 24 hours.  If you had a scopolamine patch placed behind your ear for the management of post- operative nausea and/or vomiting:  1. The medication in the patch is effective for 72 hours, after which it should be removed.  Wrap patch in a tissue and discard in the trash. Wash hands thoroughly with soap and water. 2. You may remove the patch earlier than 72 hours if you experience unpleasant side effects which may include dry mouth, dizziness or visual disturbances. 3. Avoid touching the patch. Wash your hands with soap and water after contact with the  patch.  May take Tylenol after 4pm if needed.     About my Jackson-Pratt Bulb Drain  What is a Jackson-Pratt bulb? A Jackson-Pratt is a soft, round device used to collect drainage. It is connected to a long, thin drainage catheter, which is held in place by one or two small stiches near your surgical incision site. When the bulb is squeezed, it forms a vacuum, forcing the drainage to empty into the bulb.  Emptying the Jackson-Pratt bulb- To empty the bulb: 1. Release the plug on the top of the bulb. 2. Pour the bulb's contents into a measuring container which your nurse will provide. 3. Record the time emptied and amount of drainage. Empty the drain(s) as often as your     doctor or nurse recommends.  Date                  Time                    Amount (Drain 1)                 Amount (Drain  2)  _____________________________________________________________________  _____________________________________________________________________  _____________________________________________________________________  _____________________________________________________________________  _____________________________________________________________________  _____________________________________________________________________  _____________________________________________________________________  _____________________________________________________________________  Squeezing the Jackson-Pratt Bulb- To squeeze the bulb: 1. Make sure the plug at the top of the bulb is open. 2. Squeeze the bulb tightly in your fist. You will hear air squeezing from the bulb. 3. Replace the plug while the bulb is squeezed. 4. Use a safety pin to attach the bulb to your clothing. This will keep the catheter from     pulling at the bulb insertion site.  When to call your doctor- Call your doctor if: Drain site becomes red, swollen or hot. You have a fever greater than 101 degrees F. There is oozing at the drain site. Drain falls out (apply a guaze bandage over the drain hole and secure it with tape). Drainage increases daily not related to activity patterns. (You will usually have more drainage when you are active than when you are resting.) Drainage has a bad odor.

## 2022-11-01 ENCOUNTER — Ambulatory Visit: Payer: MEDICAID | Admitting: Physician Assistant

## 2022-11-01 ENCOUNTER — Encounter (HOSPITAL_BASED_OUTPATIENT_CLINIC_OR_DEPARTMENT_OTHER): Payer: Self-pay | Admitting: Plastic Surgery

## 2022-11-01 DIAGNOSIS — Z9886 Personal history of breast implant removal: Secondary | ICD-10-CM

## 2022-11-01 LAB — SURGICAL PATHOLOGY

## 2022-11-08 ENCOUNTER — Encounter: Payer: Self-pay | Admitting: Plastic Surgery

## 2022-11-08 ENCOUNTER — Ambulatory Visit (HOSPITAL_COMMUNITY): Payer: Medicaid Other | Admitting: Clinical

## 2022-11-08 ENCOUNTER — Encounter (HOSPITAL_COMMUNITY): Payer: Self-pay

## 2022-11-08 ENCOUNTER — Other Ambulatory Visit (HOSPITAL_COMMUNITY): Payer: Self-pay | Admitting: Psychiatry

## 2022-11-08 ENCOUNTER — Ambulatory Visit (INDEPENDENT_AMBULATORY_CARE_PROVIDER_SITE_OTHER): Payer: MEDICAID | Admitting: Plastic Surgery

## 2022-11-08 VITALS — BP 113/74 | HR 90 | Ht 64.0 in | Wt 155.0 lb

## 2022-11-08 DIAGNOSIS — N644 Mastodynia: Secondary | ICD-10-CM

## 2022-11-08 DIAGNOSIS — F3160 Bipolar disorder, current episode mixed, unspecified: Secondary | ICD-10-CM

## 2022-11-08 NOTE — Progress Notes (Signed)
The patient is a 47 yrs old female here for follow up after removal of bilateral breast implants.  Drains were minimal in output.  Removed drains.  No sign of hematoma or seroma.  Patient doing well.  Continue with binder or sports bra.  Follow up in 1-2 weeks.

## 2022-11-19 NOTE — Progress Notes (Signed)
Patient is a 47 year old female here for follow-up after removal of bilateral breast implants with Dr. Ulice Bold on 10/31/2022.  She had bilateral JP drains removed at her last appointment on 11/08/2022.  Patient reports overall she is doing well, she is here with her mother.  She reports that she is overall feeling much better after surgery.  She does have some questions about having the Steri-Strips removed and some swelling of the left lateral breast.  Chaperone present on exam On exam bilateral inframammary fold incisions are intact, healing well.  She does have a small pinpoint opening of the left lateral incision that has a pinpoint area of granulation tissue protruding through.  There is some mild bleeding noted.  There is some residual swelling of bilateral breast, left lateral worse than right.  There is no subcutaneous fluid collection easily noted on exam.  There is no erythema or cellulitic changes noted of either breast.  There is minimal tenderness with palpation.  A/P:  Continue with compressive garments, avoid strenuous activities or heavy lifting. Recommend Vaseline and gauze to left breast pinpoint wound. Recommend following up in 2 weeks for reevaluation.  Encouraged patient to call with any questions or concerns.  There is no signs of concern on exam today.

## 2022-11-20 ENCOUNTER — Ambulatory Visit (INDEPENDENT_AMBULATORY_CARE_PROVIDER_SITE_OTHER): Payer: MEDICAID | Admitting: Surgical

## 2022-11-20 VITALS — BP 106/69 | HR 84

## 2022-11-20 DIAGNOSIS — Z9889 Other specified postprocedural states: Secondary | ICD-10-CM

## 2022-11-20 DIAGNOSIS — Z9886 Personal history of breast implant removal: Secondary | ICD-10-CM

## 2022-11-20 DIAGNOSIS — N644 Mastodynia: Secondary | ICD-10-CM

## 2022-11-22 ENCOUNTER — Ambulatory Visit (INDEPENDENT_AMBULATORY_CARE_PROVIDER_SITE_OTHER): Payer: MEDICAID | Admitting: Clinical

## 2022-11-22 ENCOUNTER — Encounter (HOSPITAL_COMMUNITY): Payer: Self-pay | Admitting: Clinical

## 2022-11-22 DIAGNOSIS — F411 Generalized anxiety disorder: Secondary | ICD-10-CM | POA: Diagnosis not present

## 2022-11-22 DIAGNOSIS — F410 Panic disorder [episodic paroxysmal anxiety] without agoraphobia: Secondary | ICD-10-CM | POA: Diagnosis not present

## 2022-11-22 DIAGNOSIS — F319 Bipolar disorder, unspecified: Secondary | ICD-10-CM | POA: Diagnosis not present

## 2022-11-22 DIAGNOSIS — F4310 Post-traumatic stress disorder, unspecified: Secondary | ICD-10-CM

## 2022-11-22 NOTE — Progress Notes (Unsigned)
THERAPIST PROGRESS NOTE  Session Time: 9:03-9:45am  Session #7  Virtual Visit via Video Note  I connected with Bethany [name] on 11/22/22 at 11:00 AM EDT by a video enabled telemedicine application and verified that I am speaking with the correct person using two identifiers.  Location: Patient: home Provider: [facility] therapy office   I discussed the limitations of evaluation and management by telemedicine and the availability of in person appointments. The patient expressed understanding and agreed to proceed.  I discussed the assessment and treatment plan with the patient. The patient was provided an opportunity to ask questions and all were answered. The patient agreed with the plan and demonstrated an understanding of the instructions.   The patient was advised to call back or seek an in-person evaluation if the symptoms worsen or if the condition fails to improve as anticipated.  I provided 42 minutes of non-face-to-face time during this encounter.  Lynnell Chad, LCSW    Participation Level: Active  Behavioral Response: Casual and Well Groomed Alert Anxious and Depressed  Type of Therapy: Individual Therapy  Treatment Goals addressed:  LTG: Increase ability to regulate mood as evidenced by fewer highs and fewer lows that impact Jossilyn's life  STG: Be free of suicidal thoughts; call crisis hotline if having suicidal thoughts   LTG: Explore and resolve issues related to the various traumatic events in her life  STG: Learn about typical long term/residual effects of traumatic life experiences LTG: Improve ability to see world as others do, as well as ability to accept that they do not see the world as she does.  STG: Be able to use reality testing on her auditory/visual hallucinations and paranoid beliefs   ProgressTowards Goals: Progressing  Interventions: CBT, Motivational Interviewing, and Supportive  Summary: Bethany [name] is a 47 y.o.  female who presents with Generalized Anxiety Disorder with panic attacks, Bipolar Disorder, and Post-Traumatic Stress Disorder.  She said she is doing "fair, definitely not better, borderline emotional all the time."  She reported that she has had some concerns and pain so her breast implants are scheduled to be removed on 10/30/22.  She is glad that she will be in less pain and wishes she had never had the implants, but stated that now she is going to be flat-chested and be "less of a woman." This was processed but she was not sufficiently insightful to resolve any such feelings at this time.  She said she has had a lot of emotional days since last session and she stated she has not gone out for a walk any since then, does not feel she is ready to be in public.  Some people whom she trusts are allowed to come see her, such as her mother, cousin, and 2 daughters and even them she only trusts "to a certain extent" and does not tell everything.  She stated she has valuables in her home and is paranoid about people.  This likely dates back to when her brother was murdered by his best friend who came to his house every day.  She is prevented from going in public to walk because she imagines being shot or robbed.  She feels her paranoia is reality-based and not exaggerated because "people treat everybody bad."  CSW pointed out several cognitive distortions which she understood but stated the thoughts were nonetheless valid.  CSW was supportive but also suggested there may be a compromise option between complete closedness and complete openness.  CSW asked patient if she is suicidal and she responded that she has thoughts, but nothing she will act on.  She stated she just thinks she would be "better off if I was deceased."  She has panic attacks throughout the day when she encounters loud noises or yelling.  This can be a dog barking or a neighbor yelling for a child.  CSW suggested that many children/teens with  anxiety or noise sensitivities will use noise-cancelling headphones and queried whether this is something she would be willing to try.  She reluctantly agreed to try, but said she does not like to be "shut in" which she believes the headphones/earbuds would promote.    MI was used to ask about her motivation to overcome her social anxiety in order to create a life that involves other people.  On a scale of 1-10, with 1 being "stay in the house and don't even try" and 10 being "I want to get out so I will do anything."  She stated she would rate her motivation between a 3 and 4.  This is because she feels that eventually she will it in herself to get out and make connections with other people.  Right now it is very convenient that everything can be done on-line.  CSW asked what would have to change for her to be able to get out and she stated she is not willing to ride either a bus or an [employer] unless a friend goes with her.  CSW then reviewed the process of exposure therapy and how slowly tolerance is built up over what can be an extensive period of time.  She agreed to make a list of the things that make her anxious and put the items in order of how much anxiety they cause, was told we can start the process by working on exposure to the one that is least intimidating to her.    Finally we reviewed the way in which she uses meditation and/or breathing because she said she is doing this.  She has not used any of the apps or new technique taught in past sessions.  She simply puts on her meditation music, closes the blinds, and closes her eyes.  CSW continued to recommend YouTube videos of guided meditations and/or phone apps.  Suicidal/Homicidal: No  Therapist Response: Patient is progressing AEB engaging in scheduled therapy session although it was shortened due to a pre-op appointment at 10:15am.  She presented oriented x5 and stated she was feeling "paranoid and emotional."  CSW evaluated patient's medication  compliance and self-care since last session.  CSW reviewed the last session with patient who reported she has continued to use past meditation methods and has not tried any of the new methods or aids suggested.  Patient stated her mood remains emotional on a daily basis and she does not feel any better than last session.   CSW assigned patient the task of making a hierarchical list of anxiety-provoking situations prior to next session that needs to be scheduled.  CSW encouraged patient to schedule more therapy sessions for the future, as she has no more appointments scheduled at this time.  Throughout the session, CSW gave patient the opportunity to explore thoughts and feelings associated with current life situations and past/present external stressors.   CSW encouraged patient's expression of feelings and validated patient's thoughts using empathy, active listening, open body language, and unconditional positive regard.       Recommendations:  Return to therapy ASAP, use  meditations and behavioral activations daily, report back at next session ready to talk about progress, make a list of hierarchical anxiety-provoking situations and put them in order from most stressful to least  Plan: Return again at first possible appointment that can be scheduled  Diagnosis: No diagnosis found.  Collaboration of Care: Psychiatrist AEB - doctor can read therapy notes  Patient/Guardian was advised Release of Information must be obtained prior to any record release in order to collaborate their care with an outside provider. Patient/Guardian was advised if they have not already done so to contact the registration department to sign all necessary forms in order for [name] to release information regarding their care.   Consent: Patient/Guardian gives verbal consent for treatment and assignment of benefits for services provided during this visit. Patient/Guardian expressed understanding and agreed to proceed.   Lynnell Chad, LCSW 11/22/2022

## 2022-11-24 ENCOUNTER — Other Ambulatory Visit: Payer: Self-pay | Admitting: Family Medicine

## 2022-11-24 ENCOUNTER — Other Ambulatory Visit (HOSPITAL_COMMUNITY): Payer: Self-pay | Admitting: Psychiatry

## 2022-11-24 DIAGNOSIS — K5900 Constipation, unspecified: Secondary | ICD-10-CM

## 2022-11-24 DIAGNOSIS — F3160 Bipolar disorder, current episode mixed, unspecified: Secondary | ICD-10-CM

## 2022-12-04 ENCOUNTER — Ambulatory Visit (INDEPENDENT_AMBULATORY_CARE_PROVIDER_SITE_OTHER): Payer: MEDICAID | Admitting: Surgical

## 2022-12-04 ENCOUNTER — Encounter: Payer: Self-pay | Admitting: Surgical

## 2022-12-04 VITALS — BP 119/79 | HR 87

## 2022-12-04 DIAGNOSIS — N644 Mastodynia: Secondary | ICD-10-CM

## 2022-12-04 DIAGNOSIS — Z9886 Personal history of breast implant removal: Secondary | ICD-10-CM

## 2022-12-04 NOTE — Progress Notes (Signed)
Patient is a very pleasant 47 year old female here for follow-up after removal of bilateral breast implants status post bilateral breast reconstruction.  She had this procedure on 10/31/2022 with Dr. Ulice Bold.  She is 5 weeks postop.  She reports she is overall feeling really well, she has some questions about continued recovery and restrictions.  She does also have some questions about the excess skin of bilateral mastectomy flaps.  She reports she may be traveling to [place] soon to see her daughter who is pregnant and due in approximately 1 month or lasts.  Chaperone present on exam On exam bilateral inframammary fold incisions are intact and very well-healed.  Incision CDI.  There is no erythema or cellulitic changes noted.  Bilateral NAC's are viable.  There is no subcutaneous fluid collection noted with palpation.  She does have some residual excess skin noted.  A/P:  Recommend continue with compressive garments and avoid strenuous activities or heavy lifting for 1 more week, can lift up to approximately 20 to 25 pounds at this time.  Recommend increasing activity as tolerated after 1 week.  She is scheduled for an appointment second to nature for postmastectomy bra fitting in about 1 month.  There is no signs of infection or concern on exam, she would like to follow-up to discuss excess skin.  We will have her follow-up in 3 months to discuss possible surgical intervention for excess skin with Dr. Ulice Bold.  Pictures were obtained of the patient and placed in the chart with the patient's or guardian's permission.

## 2022-12-06 ENCOUNTER — Encounter (HOSPITAL_COMMUNITY): Payer: Self-pay | Admitting: Clinical

## 2022-12-06 ENCOUNTER — Ambulatory Visit (HOSPITAL_COMMUNITY): Payer: MEDICAID | Admitting: Clinical

## 2022-12-06 DIAGNOSIS — F4310 Post-traumatic stress disorder, unspecified: Secondary | ICD-10-CM | POA: Diagnosis not present

## 2022-12-06 DIAGNOSIS — F319 Bipolar disorder, unspecified: Secondary | ICD-10-CM

## 2022-12-06 DIAGNOSIS — F410 Panic disorder [episodic paroxysmal anxiety] without agoraphobia: Secondary | ICD-10-CM | POA: Diagnosis not present

## 2022-12-06 DIAGNOSIS — F411 Generalized anxiety disorder: Secondary | ICD-10-CM
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
## 2022-12-09 ENCOUNTER — Ambulatory Visit (INDEPENDENT_AMBULATORY_CARE_PROVIDER_SITE_OTHER): Payer: MEDICAID | Admitting: Family Medicine

## 2022-12-09 ENCOUNTER — Encounter: Payer: Self-pay | Admitting: Family Medicine

## 2022-12-09 VITALS — BP 125/80 | HR 79 | Temp 98.7°F | Resp 16 | Wt 150.2 lb

## 2022-12-09 DIAGNOSIS — R059 Cough, unspecified: Secondary | ICD-10-CM | POA: Diagnosis not present

## 2022-12-09 DIAGNOSIS — K219 Gastro-esophageal reflux disease without esophagitis: Secondary | ICD-10-CM

## 2022-12-09 MED ORDER — FAMOTIDINE 20 MG PO TABS: 20.0000 mg | ORAL_TABLET | Freq: Two times a day (BID) | ORAL | 0 refills | Status: DC

## 2022-12-09 MED ORDER — PANTOPRAZOLE SODIUM 40 MG PO TBEC: 40.0000 mg | DELAYED_RELEASE_TABLET | Freq: Every day | ORAL | 1 refills | Status: DC

## 2022-12-09 NOTE — Progress Notes (Unsigned)
  Date of Visit: 12/09/2022   SUBJECTIVE:   HPI:  Bethany [name] presents today for a same day appointment to discuss feeling ill.  First became sick on 8/10.  That day had headache and vomiting.  [facility] and tested positive on a COVID test she did at home.  Was prescribed Paxlovid which she took for 5 days.  Since then has had no fevers.  Biggest complaint currently is nocturnal cough which is leading to vomiting.  Reports losing about 10 pounds since this all began from the vomiting.  Has taken Zofran which has helped with keeping her from vomiting.  Cough is persistent at night, not during the day.  No wheezing.  Does chronically take Allegra for allergies.  No history of acid reflux, but she wonders if this could be contributing  PMHx: history of migraine, menorrhagia, bipolar 1 disorder  OBJECTIVE:   BP 125/80   Pulse 79   Temp 98.7 F (37.1 C) (Oral)   Resp 16   Wt 150 lb 3.2 oz (68.1 kg)   SpO2 100%   BMI 25.78 kg/m  Gen: No acute distress, pleasant, cooperative HEENT: Normocephalic, atraumatic, moist mucous membranes, no anterior cervical lymphadenopathy, nares patent, oropharynx clear Heart: Regular rate and rhythm, no murmur Lungs: Clear to auscultation bilaterally, normal effort Neuro: Alert, grossly nonfocal, speech normal  ASSESSMENT/PLAN:   Nocturnal cough Overall benign appearing exam.  Discussed most likely etiologies for persistent nocturnal cough.  Does not seem to be asthma or allergies, but does seem as though acid reflux may be contributing.  Unclear whether her recent COVID infection sparked a flare of GERD.  Will do trial of PPI.  Also prescribed Pepcid to use if PPI alone is insufficient to control symptoms.  Follow-up if not improving.  Continue Zofran as needed, already has this at home.  [place] J. Pollie Meyer, MD [facility]

## 2022-12-09 NOTE — Progress Notes (Unsigned)
Patient c/o N/V for x10 days. Patient said she was [facility] with negative Cov[facility]. Patient said she then went home and tested positive for COVID. Patient was given medication but is still not feeling well.

## 2022-12-09 NOTE — Patient Instructions (Signed)
It was great to see you again today.  I think you are dealing with acid reflux Start protonix (pantoprazole) 40mg  daily Also sent in pepcid that you can try  Hopefully this will help - if not better in 1 week please follow up If unable to keep anything down please come back sooner  Be well, Dr. Pollie Meyer  Gastroesophageal Reflux Disease, Adult Gastroesophageal reflux (GER) happens when acid from the stomach flows up into the tube that connects the mouth and the stomach (esophagus). Normally, food travels down the esophagus and stays in the stomach to be digested. However, when a person has GER, food and stomach acid sometimes move back up into the esophagus. If this becomes a more serious problem, the person may be diagnosed with a disease called gastroesophageal reflux disease (GERD). GERD occurs when the reflux: Happens often. Causes frequent or severe symptoms. Causes problems such as damage to the esophagus. When stomach acid comes in contact with the esophagus, the acid may cause inflammation in the esophagus. Over time, GERD may create small holes (ulcers) in the lining of the esophagus. What are the causes? This condition is caused by a problem with the muscle between the esophagus and the stomach (lower esophageal sphincter, or LES). Normally, the LES muscle closes after food passes through the esophagus to the stomach. When the LES is weakened or abnormal, it does not close properly, and that allows food and stomach acid to go back up into the esophagus. The LES can be weakened by certain dietary substances, medicines, and medical conditions, including: Tobacco use. Pregnancy. Having a hiatal hernia. Alcohol use. Certain foods and beverages, such as coffee, chocolate, onions, and peppermint. What increases the risk? You are more likely to develop this condition if you: Have an increased body weight. Have a connective tissue disorder. Take NSAIDs, such as ibuprofen. What are the  signs or symptoms? Symptoms of this condition include: Heartburn. Difficult or painful swallowing and the feeling of having a lump in the throat. A bitter taste in the mouth. Bad breath and having a large amount of saliva. Having an upset or bloated stomach and belching. Chest pain. Different conditions can cause chest pain. Make sure you see your health care provider if you experience chest pain. Shortness of breath or wheezing. Ongoing (chronic) cough or a nighttime cough. Wearing away of tooth enamel. Weight loss. How is this diagnosed? This condition may be diagnosed based on a medical history and a physical exam. To determine if you have mild or severe GERD, your health care provider may also monitor how you respond to treatment. You may also have tests, including: A test to examine your stomach and esophagus with a small camera (endoscopy). A test that measures the acidity level in your esophagus. A test that measures how much pressure is on your esophagus. A barium swallow or modified barium swallow test to show the shape, size, and functioning of your esophagus. How is this treated? Treatment for this condition may vary depending on how severe your symptoms are. Your health care provider may recommend: Changes to your diet. Medicine. Surgery. The goal of treatment is to help relieve your symptoms and to prevent complications. Follow these instructions at home: Eating and drinking  Follow a diet as recommended by your health care provider. This may involve avoiding foods and drinks such as: Coffee and tea, with or without caffeine. Drinks that contain alcohol. Energy drinks and sports drinks. Carbonated drinks or sodas. Chocolate and cocoa. Peppermint and  mint flavorings. Garlic and onions. Horseradish. Spicy and acidic foods, including peppers, chili powder, curry powder, vinegar, hot sauces, and barbecue sauce. Citrus fruit juices and citrus fruits, such as oranges,  lemons, and limes. Tomato-based foods, such as red sauce, chili, salsa, and pizza with red sauce. Fried and fatty foods, such as donuts, french fries, potato chips, and high-fat dressings. High-fat meats, such as hot dogs and fatty cuts of red and white meats, such as rib eye steak, sausage, ham, and bacon. High-fat dairy items, such as whole milk, butter, and cream cheese. Eat small, frequent meals instead of large meals. Avoid drinking large amounts of liquid with your meals. Avoid eating meals during the 2-3 hours before bedtime. Avoid lying down right after you eat. Do not exercise right after you eat. Lifestyle  Do not use any products that contain nicotine or tobacco. These products include cigarettes, chewing tobacco, and vaping devices, such as e-cigarettes. If you need help quitting, ask your health care provider. Try to reduce your stress by using methods such as yoga or meditation. If you need help reducing stress, ask your health care provider. If you are overweight, reduce your weight to an amount that is healthy for you. Ask your health care provider for guidance about a safe weight loss goal. General instructions Pay attention to any changes in your symptoms. Take over-the-counter and prescription medicines only as told by your health care provider. Do not take aspirin, ibuprofen, or other NSAIDs unless your health care provider told you to take these medicines. Wear loose-fitting clothing. Do not wear anything tight around your waist that causes pressure on your abdomen. Raise (elevate) the head of your bed about 6 inches (15 cm). You can use a wedge to do this. Avoid bending over if this makes your symptoms worse. Keep all follow-up visits. This is important. Contact a health care provider if: You have: New symptoms. Unexplained weight loss. Difficulty swallowing or it hurts to swallow. Wheezing or a persistent cough. A hoarse voice. Your symptoms do not improve with  treatment. Get help right away if: You have sudden pain in your arms, neck, jaw, teeth, or back. You suddenly feel sweaty, dizzy, or light-headed. You have chest pain or shortness of breath. You vomit and the vomit is green, yellow, or black, or it looks like blood or coffee grounds. You faint. You have stool that is red, bloody, or black. You cannot swallow, drink, or eat. These symptoms may represent a serious problem that is an emergency. Do not wait to see if the symptoms will go away. Get medical help right away. Call your local emergency services (911 in the [place] Do not drive yourself to the hospital. Summary Gastroesophageal reflux happens when acid from the stomach flows up into the esophagus. GERD is a disease in which the reflux happens often, causes frequent or severe symptoms, or causes problems such as damage to the esophagus. Treatment for this condition may vary depending on how severe your symptoms are. Your health care provider may recommend diet and lifestyle changes, medicine, or surgery. Contact a health care provider if you have new or worsening symptoms. Take over-the-counter and prescription medicines only as told by your health care provider. Do not take aspirin, ibuprofen, or other NSAIDs unless your health care provider told you to do so. Keep all follow-up visits as told by your health care provider. This is important. This information is not intended to replace advice given to you by your health care provider.  Make sure you discuss any questions you have with your health care provider. Document Revised: 10/16/2019 Document Reviewed: 10/18/2019 Elsevier Patient Education  2024 [employer].

## 2022-12-13 ENCOUNTER — Encounter: Payer: Self-pay | Admitting: Family Medicine

## 2022-12-15 ENCOUNTER — Emergency Department (HOSPITAL_COMMUNITY): Payer: MEDICAID

## 2022-12-15 ENCOUNTER — Other Ambulatory Visit: Payer: Self-pay

## 2022-12-15 ENCOUNTER — Encounter (HOSPITAL_COMMUNITY): Payer: Self-pay | Admitting: Internal Medicine

## 2022-12-15 ENCOUNTER — Inpatient Hospital Stay (HOSPITAL_COMMUNITY)
Admission: EM | Admit: 2022-12-15 | Discharge: 2022-12-17 | DRG: 091 | Disposition: A | Discharge status: Home / Self Care | Payer: MEDICAID | Attending: Internal Medicine | Admitting: Internal Medicine

## 2022-12-15 DIAGNOSIS — F319 Bipolar disorder, unspecified: Secondary | ICD-10-CM | POA: Diagnosis present

## 2022-12-15 DIAGNOSIS — R053 Chronic cough: Secondary | ICD-10-CM | POA: Diagnosis present

## 2022-12-15 DIAGNOSIS — Z9013 Acquired absence of bilateral breasts and nipples: Secondary | ICD-10-CM

## 2022-12-15 DIAGNOSIS — R4182 Altered mental status, unspecified: Secondary | ICD-10-CM | POA: Diagnosis not present

## 2022-12-15 DIAGNOSIS — Z803 Family history of malignant neoplasm of breast: Secondary | ICD-10-CM | POA: Diagnosis not present

## 2022-12-15 DIAGNOSIS — Z885 Allergy status to narcotic agent status: Secondary | ICD-10-CM

## 2022-12-15 DIAGNOSIS — R9431 Abnormal electrocardiogram [ECG] [EKG]: Secondary | ICD-10-CM | POA: Diagnosis present

## 2022-12-15 DIAGNOSIS — G43709 Chronic migraine without aura, not intractable, without status migrainosus: Secondary | ICD-10-CM | POA: Diagnosis present

## 2022-12-15 DIAGNOSIS — Z818 Family history of other mental and behavioral disorders: Secondary | ICD-10-CM

## 2022-12-15 DIAGNOSIS — Z79899 Other long term (current) drug therapy: Secondary | ICD-10-CM

## 2022-12-15 DIAGNOSIS — G723 Periodic paralysis: Principal | ICD-10-CM | POA: Diagnosis present

## 2022-12-15 DIAGNOSIS — Z87410 Personal history of cervical dysplasia: Secondary | ICD-10-CM

## 2022-12-15 DIAGNOSIS — E876 Hypokalemia: Principal | ICD-10-CM

## 2022-12-15 DIAGNOSIS — G9341 Metabolic encephalopathy: Secondary | ICD-10-CM | POA: Diagnosis present

## 2022-12-15 DIAGNOSIS — R531 Weakness: Secondary | ICD-10-CM

## 2022-12-15 DIAGNOSIS — Z853 Personal history of malignant neoplasm of breast: Secondary | ICD-10-CM

## 2022-12-15 DIAGNOSIS — Z8616 Personal history of COVID-19: Secondary | ICD-10-CM

## 2022-12-15 DIAGNOSIS — N920 Excessive and frequent menstruation with regular cycle: Secondary | ICD-10-CM | POA: Diagnosis present

## 2022-12-15 DIAGNOSIS — F3160 Bipolar disorder, current episode mixed, unspecified: Secondary | ICD-10-CM | POA: Diagnosis present

## 2022-12-15 DIAGNOSIS — F419 Anxiety disorder, unspecified: Secondary | ICD-10-CM | POA: Diagnosis present

## 2022-12-15 DIAGNOSIS — Z8249 Family history of ischemic heart disease and other diseases of the circulatory system: Secondary | ICD-10-CM

## 2022-12-15 DIAGNOSIS — Z888 Allergy status to other drugs, medicaments and biological substances status: Secondary | ICD-10-CM | POA: Diagnosis not present

## 2022-12-15 DIAGNOSIS — R197 Diarrhea, unspecified: Secondary | ICD-10-CM | POA: Diagnosis present

## 2022-12-15 DIAGNOSIS — G839 Paralytic syndrome, unspecified: Secondary | ICD-10-CM | POA: Diagnosis not present

## 2022-12-15 DIAGNOSIS — R479 Unspecified speech disturbances: Secondary | ICD-10-CM | POA: Diagnosis not present

## 2022-12-15 DIAGNOSIS — Z880 Allergy status to penicillin: Secondary | ICD-10-CM | POA: Diagnosis not present

## 2022-12-15 HISTORY — DX: Periodic paralysis: G72.3

## 2022-12-15 LAB — COMPREHENSIVE METABOLIC PANEL
ALT: 20 U/L (ref 0–44)
AST: 24 U/L (ref 15–41)
Albumin: 4.4 g/dL (ref 3.5–5.0)
Alkaline Phosphatase: 54 U/L (ref 38–126)
Anion gap: 12 (ref 5–15)
BUN: 5 mg/dL — ABNORMAL LOW (ref 6–20)
CO2: 20 mmol/L — ABNORMAL LOW (ref 22–32)
Calcium: 8.7 mg/dL — ABNORMAL LOW (ref 8.9–10.3)
Chloride: 104 mmol/L (ref 98–111)
Creatinine, Ser: 0.94 mg/dL (ref 0.44–1.00)
GFR, Estimated: >60 mL/min (ref >60)
Glucose, Bld: 126 mg/dL — ABNORMAL HIGH (ref 70–99)
Potassium: 2.4 mmol/L — CL (ref 3.5–5.1)
Sodium: 136 mmol/L (ref 135–145)
Total Bilirubin: 0.5 mg/dL (ref 0.3–1.2)
Total Protein: 7.8 g/dL (ref 6.5–8.1)

## 2022-12-15 LAB — CBC
HCT: 39.2 % (ref 36.0–46.0)
HCT: 37.8 % (ref 36.0–46.0)
HCT: 37.6 % (ref 36.0–46.0)
Hemoglobin: 12.9 g/dL (ref 12.0–15.0)
Hemoglobin: 12.1 g/dL (ref 12.0–15.0)
Hemoglobin: 12.1 g/dL (ref 12.0–15.0)
MCH: 28.7 pg (ref 26.0–34.0)
MCH: 28.6 pg (ref 26.0–34.0)
MCH: 28.5 pg (ref 26.0–34.0)
MCHC: 32.9 g/dL (ref 30.0–36.0)
MCHC: 32 g/dL (ref 30.0–36.0)
MCHC: 32.2 g/dL (ref 30.0–36.0)
MCV: 87.1 fL (ref 80.0–100.0)
MCV: 89.4 fL (ref 80.0–100.0)
MCV: 88.5 fL (ref 80.0–100.0)
Platelets: 251 10*3/uL (ref 150–400)
Platelets: 243 10*3/uL (ref 150–400)
Platelets: 233 10*3/uL (ref 150–400)
RBC: 4.5 MIL/uL (ref 3.87–5.11)
RBC: 4.23 MIL/uL (ref 3.87–5.11)
RBC: 4.25 MIL/uL (ref 3.87–5.11)
RDW: 13.2 % (ref 11.5–15.5)
RDW: 13.4 % (ref 11.5–15.5)
RDW: 13.4 % (ref 11.5–15.5)
WBC: 6.1 10*3/uL (ref 4.0–10.5)
WBC: 5.3 10*3/uL (ref 4.0–10.5)
WBC: 5.5 10*3/uL (ref 4.0–10.5)
nRBC: 0 % (ref 0.0–0.2)
nRBC: 0 % (ref 0.0–0.2)
nRBC: 0 % (ref 0.0–0.2)

## 2022-12-15 LAB — PHOSPHORUS: Phosphorus: 2.4 mg/dL — ABNORMAL LOW (ref 2.5–4.6)

## 2022-12-15 LAB — DIFFERENTIAL
Abs Immature Granulocytes: 0.01 10*3/uL (ref 0.00–0.07)
Basophils Absolute: 0 10*3/uL (ref 0.0–0.1)
Basophils Relative: 0 %
Eosinophils Absolute: 0 10*3/uL (ref 0.0–0.5)
Eosinophils Relative: 0 %
Immature Granulocytes: 0 %
Lymphocytes Relative: 23 %
Lymphs Abs: 1.4 10*3/uL (ref 0.7–4.0)
Monocytes Absolute: 0.6 10*3/uL (ref 0.1–1.0)
Monocytes Relative: 9 %
Neutro Abs: 4.1 10*3/uL (ref 1.7–7.7)
Neutrophils Relative %: 68 %

## 2022-12-15 LAB — RAPID URINE DRUG SCREEN, HOSP PERFORMED
Amphetamines: NOT DETECTED
Barbiturates: NOT DETECTED
Benzodiazepines: NOT DETECTED
Cocaine: NOT DETECTED
Opiates: NOT DETECTED
Tetrahydrocannabinol: NOT DETECTED

## 2022-12-15 LAB — URINALYSIS, ROUTINE W REFLEX MICROSCOPIC
Bilirubin Urine: NEGATIVE
Glucose, UA: NEGATIVE mg/dL
Ketones, ur: 20 mg/dL — AB
Leukocytes,Ua: NEGATIVE
Nitrite: NEGATIVE
Protein, ur: NEGATIVE mg/dL
Specific Gravity, Urine: 1.036 — ABNORMAL HIGH (ref 1.005–1.030)
pH: 7 (ref 5.0–8.0)

## 2022-12-15 LAB — MAGNESIUM: Magnesium: 2 mg/dL (ref 1.7–2.4)

## 2022-12-15 LAB — PROTIME-INR
INR: 1 (ref 0.8–1.2)
Prothrombin Time: 13.4 seconds (ref 11.4–15.2)

## 2022-12-15 LAB — CREATININE, SERUM
Creatinine, Ser: 0.66 mg/dL (ref 0.44–1.00)
GFR, Estimated: >60 mL/min (ref >60)

## 2022-12-15 LAB — CBG MONITORING, ED: Glucose-Capillary: 126 mg/dL — ABNORMAL HIGH (ref 70–99)

## 2022-12-15 LAB — CK: Total CK: 191 U/L (ref 38–234)

## 2022-12-15 LAB — ETHANOL: Alcohol, Ethyl (B): <10 mg/dL (ref <10)

## 2022-12-15 LAB — POTASSIUM
Potassium: 3.1 mmol/L — ABNORMAL LOW (ref 3.5–5.1)
Potassium: 3.2 mmol/L — ABNORMAL LOW (ref 3.5–5.1)

## 2022-12-15 LAB — HCG, SERUM, QUALITATIVE: Preg, Serum: NEGATIVE

## 2022-12-15 LAB — TSH: TSH: 0.291 u[IU]/mL — ABNORMAL LOW (ref 0.350–4.500)

## 2022-12-15 LAB — APTT: aPTT: 33 seconds (ref 24–36)

## 2022-12-15 MED ORDER — SODIUM CHLORIDE 0.9 % IV SOLN: INTRAVENOUS | Status: DC

## 2022-12-15 MED ORDER — ENOXAPARIN SODIUM 40 MG/0.4ML IJ SOSY
40.0000 mg | PREFILLED_SYRINGE | Freq: Every day | INTRAMUSCULAR | Status: DC
  Administered 2022-12-15 – 2022-12-16 (×2): 40 mg via SUBCUTANEOUS
  Filled 2022-12-15 (×2): qty 0.4

## 2022-12-15 MED ORDER — ACETAMINOPHEN 650 MG RE SUPP: 650.0000 mg | Freq: Four times a day (QID) | RECTAL | Status: DC | PRN

## 2022-12-15 MED ORDER — ACETAMINOPHEN 325 MG PO TABS
650.0000 mg | ORAL_TABLET | Freq: Four times a day (QID) | ORAL | Status: DC | PRN
  Administered 2022-12-17: 650 mg via ORAL
  Filled 2022-12-15: qty 2

## 2022-12-15 MED ORDER — IOHEXOL 350 MG/ML SOLN
100.0000 mL | Freq: Once | INTRAVENOUS | Status: AC | PRN
  Administered 2022-12-15: 60 mL via INTRAVENOUS

## 2022-12-15 MED ORDER — POTASSIUM CHLORIDE 10 MEQ/100ML IV SOLN
10.0000 meq | INTRAVENOUS | Status: AC
  Administered 2022-12-15 (×4): 10 meq via INTRAVENOUS
  Filled 2022-12-15 (×4): qty 100

## 2022-12-15 MED ORDER — POTASSIUM PHOSPHATES 15 MMOLE/5ML IV SOLN
15.0000 mmol | Freq: Once | INTRAVENOUS | Status: AC
  Administered 2022-12-15: 15 mmol via INTRAVENOUS
  Filled 2022-12-15: qty 5

## 2022-12-15 MED ORDER — POTASSIUM CHLORIDE CRYS ER 20 MEQ PO TBCR: 40.0000 meq | EXTENDED_RELEASE_TABLET | Freq: Once | ORAL | Status: DC

## 2022-12-15 MED ORDER — SODIUM CHLORIDE 0.9 % IV BOLUS
500.0000 mL | Freq: Once | INTRAVENOUS | Status: DC
  Administered 2022-12-15: 500 mL via INTRAVENOUS

## 2022-12-15 MED ORDER — SODIUM CHLORIDE 0.9 % IV SOLN: 100.0000 mL/h | INTRAVENOUS | Status: DC

## 2022-12-15 MED ORDER — POTASSIUM CHLORIDE IN NACL 40-0.9 MEQ/L-% IV SOLN
INTRAVENOUS | Status: DC
  Filled 2022-12-15 (×2): qty 1000

## 2022-12-15 NOTE — ED Notes (Signed)
Unable to complete Neuro check, pt in MRI

## 2022-12-15 NOTE — ED Notes (Signed)
Unable to complete NIH, pt in MRI

## 2022-12-15 NOTE — ED Notes (Addendum)
Neuro called at 1525 Iv start 1527 To ct 1530 V/s 1535 p 73, 100% 151/85 14 r Ct w/o @ 1540 Neuro exam via  CT w/ @ 1547

## 2022-12-15 NOTE — ED Triage Notes (Signed)
Pt BIBA for altered mental status.  EMS reports pt family said she was base4line at 1pm, took a nap, when awakened from nap she was unable to speak. Vitals all within normal range.  Glucose of 102

## 2022-12-15 NOTE — Consult Note (Signed)
[facility] Telemedicine Consult   Requesting Provider: Dr. Jeraldine Loots Consult Participants: Myself, family bedside, atrium nurse Location of the provider: [facility] the [name]  This consult was provided via telemedicine with 2-way video and audio communication. The patient/family was informed that care would be provided in this way and agreed to receive care in this manner.    Chief Complaint: Not speaking, not moving  HPI: This is a 47 year old woman with a past medical history significant for bipolar disorder and recent breast explantation.   Family reports that she had her breast implants removed on 10/31/2022 and stopped her psychiatric medications about 2 weeks prior to the surgery.  Subsequently after the surgery she contracted COVID and therefore she has not yet restarted her psychiatric medications.  She has been having a cough and a runny nose but is otherwise not been having any symptoms recently such as fevers etc.  They do feel that she has had increased urinary frequency since her surgery but no blood in her urine or blood in her stool.  They note that she has been feeling sad/anxious about the fact that she looks different after her implant removal  Her mother checked on her at around 1:20 PM asking her what she wanted to eat.  When she came back at 1:35 the patient was not responding to her.  Per ED provider reports she may have been able to move slightly with EMS, and she was responding to yes/no questions with 1 or 2 blinks appropriately for EMS  At baseline she drives and is continent and there are no memory or cognitive concerns.  Family reports he last was yesterday  LKW: 1:20 PM Thrombolytic given?: No, recent surgery  IR Thrombectomy? No, no LVO Modified Rankin Scale: 0-Completely asymptomatic and back to baseline post- stroke Time of teleneurologist evaluation: 3:36 PM  Exam: Vitals:   12/15/22 1535  12/15/22 1550  BP: (!) 151/85 133/82  Pulse: 73 85  Resp: 14 18  Temp:    SpO2: 100% 100%    General: Tearful Pulmonary: breathing comfortably Cardiac: regular rate and rhythm on monitor, QTc 514  NIH Stroke scale 1A: Level of Consciousness - 0 1B: Ask Month and Age - 2 1C: 'Blink Eyes' & 'Squeeze Hands' - 1 2: Test Horizontal Extraocular Movements - 0 3: Test Visual Fields - 0  4: Test Facial Palsy - 0  5A: Test Left Arm Motor Drift - 4 5B: Test Right Arm Motor Drift - 4 6A: Test Left Leg Motor Drift - 4 6B: Test Right Leg Motor Drift - 4 7: Test Limb Ataxia - 0 8: Test Sensation - 0 9: Test Language/Aphasia- 3 10: Test Dysarthria - 2 11: Test Extinction/Inattention - 0 NIHSS score: 24   Imaging Reviewed:   Head CT personally reviewed, agree with radiology no acute intracranial process  CTA personally reviewed, agree with radiology 1. Normal variant CTA Circle of Willis without significant proximal stenosis, aneurysm, or branch vessel occlusion. 2. Normal CTA of the neck.  Labs reviewed in epic and pertinent values follow:  Basic Metabolic Panel: Recent Labs  Lab 12/15/22 1531  NA 136  K 2.4*  CL 104  CO2 20*  GLUCOSE 126*  BUN 5*  CREATININE 0.94  CALCIUM 8.7*    CBC: Recent Labs  Lab 12/15/22 1531  WBC 6.1  NEUTROABS 4.1  HGB 12.9  HCT 39.2  MCV 87.1  PLT 251    Coagulation Studies: Recent Labs  12/15/22 1531  LABPROT 13.4  INR 1.0       Assessment: Altered mental status in the setting of profound hypokalemia.  Could consider diagnosis of periodic paralysis however family denied this ever happening before and this would be an atypical age for presentation.  Hyperthyroidism and other medical etiologies should be excluded.  Low concern for seizure based on history provided by family  Recommendations:  -MRI brain to exclude acute intracranial process -Potassium repletion per ED/primary team -TSH, T3, T4 levels added on, treat as  needed -CK, phosphorus, magnesium level added on, treat as needed -Further workup for hypokalemia per ED/primary team -Inpatient neurology will sign off but please reach out if questions/concerns arise  Brooke Dare MD-PhD [facility] [phone]   If 8pm-8am, please page neurology on call as listed in AMION.  CRITICAL CARE Performed by: Gordy Councilman   Total critical care time: 45 minutes  Critical care time was exclusive of separately billable procedures and treating other patients.  Critical care was necessary to treat or prevent imminent or life-threatening deterioration, emergent evaluation for consideration of thrombectomy or thrombolytic administration.  Critical care was time spent personally by me on the following activities: development of treatment plan with patient and/or surrogate as well as nursing, discussions with consultants, evaluation of patient's response to treatment, examination of patient, obtaining history from patient or surrogate, ordering and performing treatments and interventions, ordering and review of laboratory studies, ordering and review of radiographic studies, pulse oximetry and re-evaluation of patient's condition.

## 2022-12-15 NOTE — ED Notes (Signed)
Mri complete

## 2022-12-15 NOTE — ED Notes (Addendum)
Pt was placed on bedpan and went to the restroom on command.

## 2022-12-15 NOTE — Consult Note (Signed)
1526 Code stroke cart activated 1530 Patient transported to CT 1531 Dr Iver Nestle paged 1535 Dr Iver Nestle on screen MRS 0.   CT/A results reported to Dr Iver Nestle by [facility] radiology.  No TNK

## 2022-12-15 NOTE — Progress Notes (Signed)
Chaplain responded to Code Stroke in ED. Pt was being taken out to CT, Chaplain located pt's daughter, [place] and mom, Ruthie in waiting room and led them back to pt's room with medical team's permission. Family received comfort and support from reflective listening, prayer (at their request) and a calm presence. I asked nurse to page chaplain for additional support if needed.  Chaplain [place], [place]   12/15/22 1530  Spiritual Encounters  Type of Visit Initial  Care provided to: Family  Referral source Code page  Reason for visit Code  OnCall Visit Yes  Spiritual Framework  Presenting Themes Impactful experiences and emotions  Community/Connection Family  Patient Stress Factors Health changes  Interventions  Spiritual Care Interventions Made Established relationship of care and support;Compassionate presence;Prayer  Intervention Outcomes  Outcomes Connection to spiritual care;Awareness of support;Reduced anxiety

## 2022-12-15 NOTE — Progress Notes (Signed)
Patient A&Ox4. Patient speech and mobility has returned. Patient walked to bathroom with assistance. No reports of pain. Patient reports mild tingling in face. Patient passed swallow screen.

## 2022-12-15 NOTE — ED Notes (Addendum)
ED TO INPATIENT HANDOFF REPORT  ED Nurse Name and Phone #: Dorthula Perfect RN  S Name/Age/Gender Bethany [name] 47 y.o. female Room/Bed: WA17/WA17  Code Status   Code Status: Full Code  Home/SNF/Other inpt    Triage Complete: Triage complete  Chief Complaint Hypokalemia [E87.6]  Triage Note Pt BIBA for altered mental status.  EMS reports pt family said she was base4line at 1pm, took a nap, when awakened from nap she was unable to speak. Vitals all within normal range.  Glucose of 102    Allergies Allergies  Allergen Reactions   Penicillins Hives and Nausea And Vomiting   Tapentadol Hives   Lamictal [Lamotrigine] Rash   Codeine Hives   Oxycodone Hcl Hives    Level of Care/Admitting Diagnosis ED Disposition     ED Disposition  Admit   Condition  --   Comment  Hospital Area: [facility] [[id number]]  Level of Care: Telemetry [5]  Admit to tele based on following criteria: Monitor QTC interval  May admit patient to [facility] or [facility] if equivalent level of care is available:: Yes  Covid Evaluation: Asymptomatic - no recent exposure (last 10 days) testing not required  Diagnosis: Hypokalemia [956213]  Admitting Physician: Nena Polio  Attending Physician: Inez Catalina [phone]  Certification:: I certify this patient will need inpatient services for at least 2 midnights  Expected Medical Readiness: 12/17/2022          B Medical/Surgery History Past Medical History:  Diagnosis Date   Anemia    Anxiety    COVID-19    Depression    Family history of breast cancer 06/02/2018   Fibrocystic disease of both breasts    History of cervical dysplasia    laser surgery   Hyperglycemia 10/14/2020   Hypokalemia 12/03/2021   Insomnia    Left breast mass 06/30/2008   Low blood sugar 2001   pt checks BS in AM, controls with diet   Need for immunization against influenza 01/12/2022   Routine cervical smear 06/20/2020   Routine  health maintenance 05/09/2014   Routine screening for STI (sexually transmitted infection) 01/12/2022   S/P mastectomy, bilateral 10/27/2018   Surgery follow-up 10/19/2018   UTI (urinary tract infection) 11/01/2020   Vaginal Pap smear, abnormal    Past Surgical History:  Procedure Laterality Date   AUGMENTATION MAMMAPLASTY Bilateral    BREAST FIBROADENOMA SURGERY  2011, 2008   benign    BREAST IMPLANT REMOVAL Bilateral 10/31/2022   Procedure: REMOVAL BREAST IMPLANTS;  Surgeon: Peggye Form, DO;  Location: [facility];  Service: Plastics;  Laterality: Bilateral;   BREAST RECONSTRUCTION WITH PLACEMENT OF TISSUE EXPANDER AND FLEX HD (ACELLULAR HYDRATED DERMIS) Bilateral 10/19/2018   Procedure: BREAST RECONSTRUCTION WITH PLACEMENT OF TISSUE EXPANDER AND FLEX HD (ACELLULAR HYDRATED DERMIS);  Surgeon: Peggye Form, DO;  Location: [facility];  Service: Plastics;  Laterality: Bilateral;   CESAREAN SECTION  04/22/1993   HYSTEROSCOPY WITH NOVASURE N/A 01/02/2016   Procedure: NOVASURE;  Surgeon: Adam Phenix, MD;  Location: [facility];  Service: Gynecology;  Laterality: N/A;   MASTECTOMY Bilateral 2019   NIPPLE SPARING MASTECTOMY Bilateral 10/19/2018   Procedure: BILATERAL NIPPLE SPARING MASTECTOMY;  Surgeon: Griselda Miner, MD;  Location: [facility];  Service: General;  Laterality: Bilateral;   REMOVAL OF BILATERAL TISSUE EXPANDERS WITH PLACEMENT OF BILATERAL BREAST IMPLANTS Bilateral 12/30/2018   Procedure: REMOVAL OF BILATERAL TISSUE EXPANDERS WITH PLACEMENT OF BILATERAL BREAST IMPLANTS;  Surgeon: Peggye Form, DO;  Location: [facility];  Service: Plastics;  Laterality: Bilateral;   TUBAL LIGATION  04/22/2001     A IV Location/Drains/Wounds Patient Lines/Drains/Airways Status     Active Line/Drains/Airways     Name Placement date Placement time Site Days   Peripheral IV 12/15/22 20 G Right Antecubital 12/15/22  1237  Antecubital  less than 1   Closed  System Drain 1 Right;Lateral;Inferior Breast Bulb (JP) 19 Fr. 10/31/22  1100  Breast  45   Closed System Drain 2 Left;Lateral;Inferior Breast Bulb (JP) 19 Fr. 10/31/22  1113  Breast  45            Intake/Output Last 24 hours No intake or output data in the 24 hours ending 12/15/22 1745  Labs/Imaging Results for orders placed or performed during the hospital encounter of 12/15/22 (from the past 48 hour(s))  Ethanol     Status: None   Collection Time: 12/15/22  3:31 PM  Result Value Ref Range   Alcohol, Ethyl (B) <10 <10 mg/dL    Comment: (NOTE) Lowest detectable limit for serum alcohol is 10 mg/dL.  For medical purposes only. Performed at [facility], [address], [place], [place] [address]   Protime-INR     Status: None   Collection Time: 12/15/22  3:31 PM  Result Value Ref Range   Prothrombin Time 13.4 11.4 - 15.2 seconds   INR 1.0 0.8 - 1.2    Comment: (NOTE) INR goal varies based on device and disease states. Performed at [facility] [address], [place], [place] [address]   APTT     Status: None   Collection Time: 12/15/22  3:31 PM  Result Value Ref Range   aPTT 33 24 - 36 seconds    Comment: Performed at [facility], [address] [place], [place] [address]  CBC     Status: None   Collection Time: 12/15/22  3:31 PM  Result Value Ref Range   WBC 6.1 4.0 - 10.5 K/uL   RBC 4.50 3.87 - 5.11 MIL/uL   Hemoglobin 12.9 12.0 - 15.0 g/dL   HCT 91.4 78.2 - 95.6 %   MCV 87.1 80.0 - 100.0 fL   MCH 28.7 26.0 - 34.0 pg   MCHC 32.9 30.0 - 36.0 g/dL   RDW 21.3 08.6 - 57.8 %   Platelets 251 150 - 400 K/uL   nRBC 0.0 0.0 - 0.2 %    Comment: Performed at [facility], [address], [place], [place] [address]  Differential     Status: None   Collection Time: 12/15/22  3:31 PM  Result Value Ref Range   Neutrophils Relative % 68 %   Neutro Abs 4.1 1.7 - 7.7 K/uL   Lymphocytes Relative 23  %   Lymphs Abs 1.4 0.7 - 4.0 K/uL   Monocytes Relative 9 %   Monocytes Absolute 0.6 0.1 - 1.0 K/uL   Eosinophils Relative 0 %   Eosinophils Absolute 0.0 0.0 - 0.5 K/uL   Basophils Relative 0 %   Basophils Absolute 0.0 0.0 - 0.1 K/uL   Immature Granulocytes 0 %   Abs Immature Granulocytes 0.01 0.00 - 0.07 K/uL    Comment: Performed at [facility], [address], [place], [place] [address]  Comprehensive metabolic panel     Status: Abnormal   Collection Time: 12/15/22  3:31 PM  Result Value Ref Range  Sodium 136 135 - 145 mmol/L   Potassium 2.4 (LL) 3.5 - 5.1 mmol/L    Comment: CRITICAL RESULT CALLED TO, READ BACK BY AND VERIFIED WITH LIVINGSTON,K. EMTP AT 1605 12/15/22 MULLINS,T    Chloride 104 98 - 111 mmol/L   CO2 20 (L) 22 - 32 mmol/L   Glucose, Bld 126 (H) 70 - 99 mg/dL    Comment: Glucose reference range applies only to samples taken after fasting for at least 8 hours.   BUN 5 (L) 6 - 20 mg/dL   Creatinine, Ser 4.09 0.44 - 1.00 mg/dL   Calcium 8.7 (L) 8.9 - 10.3 mg/dL   Total Protein 7.8 6.5 - 8.1 g/dL   Albumin 4.4 3.5 - 5.0 g/dL   AST 24 15 - 41 U/L   ALT 20 0 - 44 U/L   Alkaline Phosphatase 54 38 - 126 U/L   Total Bilirubin 0.5 0.3 - 1.2 mg/dL   GFR, Estimated >81 >19 mL/min    Comment: (NOTE) Calculated using the CKD-EPI Creatinine Equation (2021)    Anion gap 12 5 - 15    Comment: Performed at [facility], [address] [place], [place] [address]  hCG, serum, qualitative     Status: None   Collection Time: 12/15/22  3:31 PM  Result Value Ref Range   Preg, Serum NEGATIVE NEGATIVE    Comment:        THE SENSITIVITY OF THIS METHODOLOGY IS >10 mIU/mL. Performed at [facility], [address] [place], [place] [address]    CT ANGIO HEAD NECK W WO CM (CODE STROKE)  Result Date: 12/15/2022 CLINICAL DATA:  Aphasia.  Code stroke EXAM: CT ANGIOGRAPHY HEAD AND NECK WITH AND WITHOUT CONTRAST TECHNIQUE:  Multidetector CT imaging of the head and neck was performed using the standard protocol during bolus administration of intravenous contrast. Multiplanar CT image reconstructions and MIPs were obtained to evaluate the vascular anatomy. Carotid stenosis measurements (when applicable) are obtained utilizing NASCET criteria, using the distal internal carotid diameter as the denominator. RADIATION DOSE REDUCTION: This exam was performed according to the departmental dose-optimization program which includes automated exposure control, adjustment of the mA and/or kV according to patient size and/or use of iterative reconstruction technique. CONTRAST:  60mL OMNIPAQUE IOHEXOL 350 MG/ML SOLN COMPARISON:  None Available. FINDINGS: CTA NECK FINDINGS Aortic arch: Common origin of the left common carotid artery and innominate artery is noted. No significant atherosclerotic disease present at the arch. Great vessel origins are within normal limits. The left vertebral artery tonight's directly from the aortic arch, a normal variant. Right carotid system: Right common carotid artery is within normal limits. Bifurcation is unremarkable. The cervical right ICA is normal. Left carotid system: The left common carotid artery is within normal limits. The bifurcation is unremarkable. The cervical left ICA is normal. Vertebral arteries: The right vertebral artery originates from the subclavian artery without focal stenosis. The vertebral arteries are codominant. No significant stenosis is present in either vertebral artery in the neck. Skeleton: The vertebral body heights and alignment are normal. No focal osseous lesions are present. Other neck: Soft tissues the neck are otherwise unremarkable. Salivary glands are within normal limits. Thyroid is normal. No significant adenopathy is present. No focal mucosal or submucosal lesions are present. Upper chest: The lung apices are clear. Review of the MIP images confirms the above findings CTA  HEAD FINDINGS Anterior circulation: The internal carotid arteries are within normal limits from the skull base to  the ICA termini. The A1 and M1 segments are normal. The anterior communicating artery is patent. The MCA bifurcations are intact. The ACA and MCA branch vessels are normal bilaterally. No aneurysm is present. Posterior circulation: The vertebral arteries are codominant. The PICA origin is visualized and normal bilaterally. Vertebrobasilar junction basilar artery is normal. The superior cerebellar arteries patent. Both posterior cerebral arteries originate from basilar tip. The PCA branch vessels are normal bilaterally. Venous sinuses: The dural sinuses are patent. The straight sinus and deep cerebral veins are intact. Cortical veins are within normal limits. No significant vascular malformation is evident. Anatomic variants: None Review of the MIP images confirms the above findings IMPRESSION: 1. Normal variant CTA Circle of Willis without significant proximal stenosis, aneurysm, or branch vessel occlusion. 2. Normal CTA of the neck. Electronically Signed   By: Marin Roberts M.D.   On: 12/15/2022 16:16   CT HEAD CODE STROKE WO CONTRAST  Result Date: 12/15/2022 CLINICAL DATA:  Code stroke. Neuro deficit, acute, stroke suspected. Aphasia. EXAM: CT HEAD WITHOUT CONTRAST TECHNIQUE: Contiguous axial images were obtained from the base of the skull through the vertex without intravenous contrast. RADIATION DOSE REDUCTION: This exam was performed according to the departmental dose-optimization program which includes automated exposure control, adjustment of the mA and/or kV according to patient size and/or use of iterative reconstruction technique. COMPARISON:  CT head without contrast 11/11/2021 FINDINGS: Brain: No acute infarct, hemorrhage, or mass lesion is present. No significant white matter lesions are present. The ventricles are of normal size. No significant extraaxial fluid collection is  present. The brainstem and cerebellum are within normal limits. Midline structures are within normal limits. Vascular: No hyperdense vessel or unexpected calcification. Skull: Calvarium is intact. No focal lytic or blastic lesions are present. No significant extracranial soft tissue lesion is present. Sinuses/Orbits: The paranasal sinuses and mastoid air cells are clear. The globes and orbits are within normal limits. ASPECTS [facility] Stroke Program Early CT Score) - Ganglionic level infarction (caudate, lentiform nuclei, internal capsule, insula, M1-M3 cortex): 7/7 - Supraganglionic infarction (M4-M6 cortex): 3/3 Total score (0-10 with 10 being normal): 10/10 IMPRESSION: 1. Negative CT of the head. 2. Aspects is 10/10. The above was relayed via text pager to Dr. Otelia Limes on 12/15/2022 at 15:42 . Electronically Signed   By: Marin Roberts M.D.   On: 12/15/2022 15:42    Pending Labs Unresulted Labs (From admission, onward)     Start     Ordered   12/22/22 0500  Creatinine, serum  (enoxaparin (LOVENOX)    CrCl >/= 30 ml/min)  Weekly,   R     Comments: while on enoxaparin therapy    12/15/22 1732   12/16/22 0500  Comprehensive metabolic panel  Tomorrow morning,   R        12/15/22 1732   12/16/22 0500  CBC  Tomorrow morning,   R        12/15/22 1732   12/15/22 1733  CBC  (enoxaparin (LOVENOX)    CrCl >/= 30 ml/min)  Once,   R       Comments: Baseline for enoxaparin therapy IF NOT ALREADY DRAWN.  Notify MD if PLT < 100 K.    12/15/22 1732   12/15/22 1733  Creatinine, serum  (enoxaparin (LOVENOX)    CrCl >/= 30 ml/min)  Once,   R       Comments: Baseline for enoxaparin therapy IF NOT ALREADY DRAWN.    12/15/22 1732   12/15/22 1733  Potassium  Now then every 6 hours,   R      12/15/22 1732   12/15/22 1630  TSH  Add-on,   AD        12/15/22 1629   12/15/22 1630  T4, free  Once,   URGENT        12/15/22 1629   12/15/22 1630  T3  Add-on,   AD        12/15/22 1629   12/15/22 1629  Magnesium   Add-on,   AD        12/15/22 1628   12/15/22 1609  Phosphorus  Add-on,   AD        12/15/22 1608   12/15/22 1609  CK  Add-on,   AD        12/15/22 1608   12/15/22 1524  Urine rapid drug screen (hosp performed)not at [facility]  ONCE - STAT,   STAT        12/15/22 1525   12/15/22 1524  Urinalysis, Routine w reflex microscopic -Urine, Clean Catch  Once,   URGENT       Question:  Specimen Source  Answer:  Urine, Clean Catch   12/15/22 1525            Vitals/Pain Today's Vitals   12/15/22 1650 12/15/22 1705 12/15/22 1715 12/15/22 1730  BP: 132/78 130/89 130/89 (!) 140/85  Pulse: 65 73 73 69  Resp:  13  13  Temp:      TempSrc:      SpO2: 99% 100% 100% 100%  Weight:      Height:        Isolation Precautions No active isolations  Medications Medications  potassium chloride 10 mEq in 100 mL IVPB (10 mEq Intravenous New Bag/Given 12/15/22 1737)  0.9 %  sodium chloride infusion (has no administration in time range)  enoxaparin (LOVENOX) injection 40 mg (has no administration in time range)  acetaminophen (TYLENOL) tablet 650 mg (has no administration in time range)    Or  acetaminophen (TYLENOL) suppository 650 mg (has no administration in time range)  iohexol (OMNIPAQUE) 350 MG/ML injection 100 mL (60 mLs Intravenous Contrast Given 12/15/22 1608)    Mobility non-ambulatory     Focused Assessments Neuro Assessment Handoff:  Swallow screen pass? No    NIH Stroke Scale  Dizziness Present: No Headache Present: No Interval: Other (Comment) Level of Consciousness (1a.)   : Alert, keenly responsive LOC Questions (1b. )   : Answers both questions correctly LOC Commands (1c. )   : Performs both tasks correctly Best Gaze (2. )  : Normal Visual (3. )  : No visual loss Facial Palsy (4. )    : Normal symmetrical movements Motor Arm, Left (5a. )   : No movement Motor Arm, Right (5b. ) : No movement Motor Leg, Left (6a. )  : No movement Motor Leg, Right (6b. ) : No movement Limb  Ataxia (7. ): Amputation or joint fusion Sensory (8. )  : Severe to total sensory loss, patient is not aware of being touched in the face, arm, and leg Best Language (9. )  : Mute, global aphasia Dysarthria (10. ): Intubated or other physical barrier Extinction/Inattention (11.)   : No Abnormality Complete NIHSS TOTAL: 22 Last date known well: 12/15/22 Last time known well: 1300 Neuro Assessment: Exceptions to WDL (unable to assess pt in MRI) Neuro Checks:   Initial (12/15/22 1520)  Has TPA been given? No If patient is a  Neuro Trauma and patient is going to OR before floor call report to 4N Charge nurse: [phone] or [phone]   R Recommendations: See Admitting Provider Note  Report given to:   Additional Notes: pt is able to answer y/n questions by blinking.

## 2022-12-15 NOTE — ED Notes (Addendum)
Left for MRI @1557  Arrived at MRI @1601  Advised by MRI tech that another pt in MRI In MRI room @1620  MRI machine not working properly, required resetting

## 2022-12-15 NOTE — H&P (Signed)
History and Physical    Bethany [name] QIH:[id number] DOB: November 08, 1975 DOA: 12/15/2022  PCP: [name], MD  Patient coming from: Home via EMS  Chief Complaint: catatonia, unresponsive   HPI: Bethany [name] is a 47 y.o. female with medical history significant of Bipolar 1, migraine, elevated A1C, anemia, h/o breast cancer who presented with non responsiveness, catatonia, hypokalemia.   Patient is alert, moves eyes to voice and will follow some commands.  Blinks once to yes and twice to no.  She notes not being able to feel much of anything in her extremities, does not feel pain with pinch to the arms, hands.  She does have sensation to the face.  She had a very delayed response to wiggling the toes and did move the left 4th toe and the right foot after about 2-3 seconds.  She reports pain in the suprapubic area of the abdomen (flinching) but otherwise no pain.    Spoke with family, patient's mother: Mother notes that she was getting ready to go to church this morning and [name] was noted to be very tired and didn't feel like going to church.  Her Mother came down to discuss lunch with her and Gailene was interested in ordering lunch.  Her mother went back to her apartment.  When she came down (a couple of hours later) to get her for lunch, [name] was non responsive.  She was sitting on the couch with her head in her hand and was nonresponsive to verbal or physical stimuli.  911 was called and EMS could not get her to stand, though at that time she was able to try to stand, but could not move on her own.  Family reports that her only new medications are for nausea and vomiting, including PPI, famotidine and anti-nausea medication, but they are not sure which ones.  Family does say that she was fatigued and complaining of a runny nose and cough, though this has been chronic over the last few months.   Per EDP: EMS was able to communicate with her by asking her to blink for yes and no, she was also  responding somewhat to painful stimuli.  By the time she was in the ED, she was not blinking for yes and no and was not responding to stimuli.   Medication review:   PPI started on 8/19 by [facility] Zofran - long term medication Phenergan: Last refill 05/2022  -unclear if taking Tizanidine - refilled August 2024  Recent addition of gabapentin 100mg  TID 10/30/22 by Psychiatry  Tizanidine (fatigue, dyskinesia, sedated state, tremor) Zofran (fatigue, malaise, dystonic reaction) Phenergan (ataxia, catatonia, lassitude) Protonix (asthenia, confusion) Gabapentin (myoclonus, movement disorder, encephalopathy, coma) Linzess (none) Quetiapine (sedated state, parkinsonism, dystonia, delirium) Trazodone (stupor, serotonin syndrome, drowsiness) Lithium  ED Course: EKG done, QTc is 514, TWI in aVF, V3-V5. TWI unchanged from previous, but QTc is prolonged now.  K is low at 2.4, CO2 is 20, Glu is 126, calcium mildy low at 8.7. Albumin normal. CBC is WNL.  Preg negative. CT head and CTA of the head did not show a stroke or any mass.   Review of Systems: As per HPI otherwise all other systems reviewed and are negative.   Past Medical History:  Diagnosis Date   Anemia    Anxiety    COVID-19    Depression    Family history of breast cancer 06/02/2018   Fibrocystic disease of both breasts    History of cervical dysplasia  laser surgery   Hyperglycemia 10/14/2020   Hypokalemia 12/03/2021   Insomnia    Left breast mass 06/30/2008   Low blood sugar 2001   pt checks BS in AM, controls with diet   Need for immunization against influenza 01/12/2022   Routine cervical smear 06/20/2020   Routine health maintenance 05/09/2014   Routine screening for STI (sexually transmitted infection) 01/12/2022   S/P mastectomy, bilateral 10/27/2018   Surgery follow-up 10/19/2018   UTI (urinary tract infection) 11/01/2020   Vaginal Pap smear, abnormal     Past Surgical History:  Procedure Laterality Date    AUGMENTATION MAMMAPLASTY Bilateral    BREAST FIBROADENOMA SURGERY  2011, 2008   benign    BREAST IMPLANT REMOVAL Bilateral 10/31/2022   Procedure: REMOVAL BREAST IMPLANTS;  Surgeon: Peggye Form, DO;  Location: [facility];  Service: Plastics;  Laterality: Bilateral;   BREAST RECONSTRUCTION WITH PLACEMENT OF TISSUE EXPANDER AND FLEX HD (ACELLULAR HYDRATED DERMIS) Bilateral 10/19/2018   Procedure: BREAST RECONSTRUCTION WITH PLACEMENT OF TISSUE EXPANDER AND FLEX HD (ACELLULAR HYDRATED DERMIS);  Surgeon: Peggye Form, DO;  Location: [facility];  Service: Plastics;  Laterality: Bilateral;   CESAREAN SECTION  04/22/1993   HYSTEROSCOPY WITH NOVASURE N/A 01/02/2016   Procedure: NOVASURE;  Surgeon: Adam Phenix, MD;  Location: [facility];  Service: Gynecology;  Laterality: N/A;   MASTECTOMY Bilateral 2019   NIPPLE SPARING MASTECTOMY Bilateral 10/19/2018   Procedure: BILATERAL NIPPLE SPARING MASTECTOMY;  Surgeon: Griselda Miner, MD;  Location: [facility];  Service: General;  Laterality: Bilateral;   REMOVAL OF BILATERAL TISSUE EXPANDERS WITH PLACEMENT OF BILATERAL BREAST IMPLANTS Bilateral 12/30/2018   Procedure: REMOVAL OF BILATERAL TISSUE EXPANDERS WITH PLACEMENT OF BILATERAL BREAST IMPLANTS;  Surgeon: Peggye Form, DO;  Location: [facility];  Service: Plastics;  Laterality: Bilateral;   TUBAL LIGATION  04/22/2001    Social History  reports that she has never smoked. She has never been exposed to tobacco smoke. She has never used smokeless tobacco. She reports that she does not drink alcohol and does not use drugs.  Allergies  Allergen Reactions   Penicillins Hives and Nausea And Vomiting   Tapentadol Hives   Lamictal [Lamotrigine] Rash   Codeine Hives   Oxycodone Hcl Hives   Reviewed with family Family History  Problem Relation Age of Onset   Anxiety disorder Father    Depression Father    Diabetes Maternal Grandmother    Hypertension Paternal  Grandfather    Cancer Maternal Aunt        breast   Breast cancer Maternal Aunt    Alcohol abuse Maternal Uncle    Alcohol abuse Cousin    Drug abuse Cousin    Stroke Neg Hx      Prior to Admission medications   Medication Sig Start Date End Date Taking? Authorizing Provider  acetaminophen (TYLENOL) 650 MG CR tablet Take 1,300 mg by mouth every 8 (eight) hours. Takes it 2 to 3 times per day    [provider]       [provider]  diclofenac Sodium (VOLTAREN) 1 % GEL APPLY 4 G TOPICALLY 4 TIMES A DAY 10/31/22   [name], MD  famotidine (PEPCID) 20 MG tablet Take 1 tablet (20 mg total) by mouth 2 (two) times daily. 12/09/22   Latrelle Dodrill, MD  gabapentin (NEURONTIN) 100 MG capsule Take 1 capsule (100 mg total) by mouth 3 (three) times daily. 10/30/22 12/29/22  Kathryne Sharper  T, MD       [name], MD  linaclotide [facility]) 145 MCG CAPS capsule TAKE 1 CAPSULE BY MOUTH EVERY DAY BEFORE BREAKFAST - INSURANCE WILL PAY ON 6/25 11/25/22   [name], MD  lithium carbonate (LITHOBID) 300 MG ER tablet Take 1 tablet (300 mg total) by mouth 3 (three) times daily. 10/30/22 12/29/22  Arfeen, Phillips Grout, MD  ondansetron (ZOFRAN) 4 MG tablet Take 1 tablet (4 mg total) by mouth every 8 (eight) hours as needed for nausea or vomiting. 10/11/22   Scheeler, Kermit Balo, PA-C  pantoprazole (PROTONIX) 40 MG tablet Take 1 tablet (40 mg total) by mouth daily. 12/09/22   Latrelle Dodrill, MD  promethazine (PHENERGAN) 12.5 MG tablet TAKE 1 TABLET BY MOUTH EVERY 6 HOURS AS NEEDED FOR NAUSEA OR VOMITING. 06/21/22   [name], MD  QUEtiapine Fumarate 150 MG TABS Take 150 mg by mouth at bedtime. 10/30/22 11/29/22  Arfeen, Phillips Grout, MD  tiZANidine (ZANAFLEX) 2 MG tablet TAKE 1 TO 2 TABLETS (2-4 MG TOTAL) BY MOUTH EVERY 6 HOURS AS NEEDED 07/29/22   [name], MD  traZODone (DESYREL) 100 MG tablet Take 1 tablet (100 mg total) by mouth at bedtime. 10/30/22   Cleotis Nipper, MD    Physical Exam: Vitals:    12/15/22 1650 12/15/22 1705 12/15/22 1715 12/15/22 1730  BP: 132/78 130/89 130/89 (!) 140/85  Pulse: 65 73 73 69  Resp:  13  13  Temp:      TempSrc:      SpO2: 99% 100% 100% 100%  Weight:      Height:        Constitutional: Alert, moves eyes to voice.  Eyes: PERRL, lids and conjunctivae normal ENMT: Mucous membranes are moist.  Neck: no palpable LAD. Maybe some increased fat in the supraclavicular spaces Respiratory: Breathing independently on room air, no wheezing Cardiovascular: Regular, pulses are intact in the radial arteries bilaterally and the DP arteries bilaterally.   No murmur Abdomen: soft, ND, + tenderness to palpation of the suprapubic area, she flinched and made a brief noise with deep palpation.  Musculoskeletal: No joint deformity upper and lower extremities. GNormal muscle tone.  Skin: no rashes, lesions, ulcers on exposed skin.  Neurologic: Sensation is diminished throughout.  Strength cannot truly be tested at this time.  0/5 throughout.  Facial sensation is intact in the forehead, cheeks and chin.  She had delayed movement of the feet and vocalization with palpation of the abdomen.  Psychiatric: Alert, appears anxious, which is understandable.    Labs on Admission: I have personally reviewed following labs and imaging studies  CBC: Recent Labs  Lab 12/15/22 1531  WBC 6.1  NEUTROABS 4.1  HGB 12.9  HCT 39.2  MCV 87.1  PLT 251    Basic Metabolic Panel: Recent Labs  Lab 12/15/22 1531  NA 136  K 2.4*  CL 104  CO2 20*  GLUCOSE 126*  BUN 5*  CREATININE 0.94  CALCIUM 8.7*    GFR: Estimated Creatinine Clearance: 69.4 mL/min (by C-G formula based on SCr of 0.94 mg/dL).  Liver Function Tests: Recent Labs  Lab 12/15/22 1531  AST 24  ALT 20  ALKPHOS 54  BILITOT 0.5  PROT 7.8  ALBUMIN 4.4    Urine analysis: Pending  Radiological Exams on Admission: CT ANGIO HEAD NECK W WO CM (CODE STROKE)  Result Date: 12/15/2022 CLINICAL DATA:   Aphasia.  Code stroke EXAM: CT ANGIOGRAPHY HEAD AND NECK WITH AND WITHOUT  CONTRAST TECHNIQUE: Multidetector CT imaging of the head and neck was performed using the standard protocol during bolus administration of intravenous contrast. Multiplanar CT image reconstructions and MIPs were obtained to evaluate the vascular anatomy. Carotid stenosis measurements (when applicable) are obtained utilizing NASCET criteria, using the distal internal carotid diameter as the denominator. RADIATION DOSE REDUCTION: This exam was performed according to the departmental dose-optimization program which includes automated exposure control, adjustment of the mA and/or kV according to patient size and/or use of iterative reconstruction technique. CONTRAST:  60mL OMNIPAQUE IOHEXOL 350 MG/ML SOLN COMPARISON:  None Available. FINDINGS: CTA NECK FINDINGS Aortic arch: Common origin of the left common carotid artery and innominate artery is noted. No significant atherosclerotic disease present at the arch. Great vessel origins are within normal limits. The left vertebral artery tonight's directly from the aortic arch, a normal variant. Right carotid system: Right common carotid artery is within normal limits. Bifurcation is unremarkable. The cervical right ICA is normal. Left carotid system: The left common carotid artery is within normal limits. The bifurcation is unremarkable. The cervical left ICA is normal. Vertebral arteries: The right vertebral artery originates from the subclavian artery without focal stenosis. The vertebral arteries are codominant. No significant stenosis is present in either vertebral artery in the neck. Skeleton: The vertebral body heights and alignment are normal. No focal osseous lesions are present. Other neck: Soft tissues the neck are otherwise unremarkable. Salivary glands are within normal limits. Thyroid is normal. No significant adenopathy is present. No focal mucosal or submucosal lesions are present.  Upper chest: The lung apices are clear. Review of the MIP images confirms the above findings CTA HEAD FINDINGS Anterior circulation: The internal carotid arteries are within normal limits from the skull base to the ICA termini. The A1 and M1 segments are normal. The anterior communicating artery is patent. The MCA bifurcations are intact. The ACA and MCA branch vessels are normal bilaterally. No aneurysm is present. Posterior circulation: The vertebral arteries are codominant. The PICA origin is visualized and normal bilaterally. Vertebrobasilar junction basilar artery is normal. The superior cerebellar arteries patent. Both posterior cerebral arteries originate from basilar tip. The PCA branch vessels are normal bilaterally. Venous sinuses: The dural sinuses are patent. The straight sinus and deep cerebral veins are intact. Cortical veins are within normal limits. No significant vascular malformation is evident. Anatomic variants: None Review of the MIP images confirms the above findings IMPRESSION: 1. Normal variant CTA Circle of Willis without significant proximal stenosis, aneurysm, or branch vessel occlusion. 2. Normal CTA of the neck. Electronically Signed   By: Marin Roberts M.D.   On: 12/15/2022 16:16   CT HEAD CODE STROKE WO CONTRAST  Result Date: 12/15/2022 CLINICAL DATA:  Code stroke. Neuro deficit, acute, stroke suspected. Aphasia. EXAM: CT HEAD WITHOUT CONTRAST TECHNIQUE: Contiguous axial images were obtained from the base of the skull through the vertex without intravenous contrast. RADIATION DOSE REDUCTION: This exam was performed according to the departmental dose-optimization program which includes automated exposure control, adjustment of the mA and/or kV according to patient size and/or use of iterative reconstruction technique. COMPARISON:  CT head without contrast 11/11/2021 FINDINGS: Brain: No acute infarct, hemorrhage, or mass lesion is present. No significant white matter lesions  are present. The ventricles are of normal size. No significant extraaxial fluid collection is present. The brainstem and cerebellum are within normal limits. Midline structures are within normal limits. Vascular: No hyperdense vessel or unexpected calcification. Skull: Calvarium is intact. No focal lytic  or blastic lesions are present. No significant extracranial soft tissue lesion is present. Sinuses/Orbits: The paranasal sinuses and mastoid air cells are clear. The globes and orbits are within normal limits. ASPECTS [facility] Stroke Program Early CT Score) - Ganglionic level infarction (caudate, lentiform nuclei, internal capsule, insula, M1-M3 cortex): 7/7 - Supraganglionic infarction (M4-M6 cortex): 3/3 Total score (0-10 with 10 being normal): 10/10 IMPRESSION: 1. Negative CT of the head. 2. Aspects is 10/10. The above was relayed via text pager to Dr. Otelia Limes on 12/15/2022 at 15:42 . Electronically Signed   By: Marin Roberts M.D.   On: 12/15/2022 15:42    EKG: Independently reviewed. QTc is 514, TWI in aVF, V3-V5. TWI unchanged from previous, but QTc is prolonged now  Assessment/Plan  Paralysis, AMS Hypokalemia - Unclear cause, she is on quite a few medications, and possibly with interactions. She is at risk for periodic hypokalemic paralysis, serotonin syndrome (not clear that she has clonus) and etiologies - Patient's family reports possibly not eating well - QTc is prolonged, monitor on telemetry - Monitor with neuro checks q 8 hours - Hold PO medications at this time.  - Phos mildly low - replace - TSH is low - check free T4 and T3 (pending) - CK normal - Replace K with IV and also in fluids - Check K q 6 hours and replace as needed - Hold psychiatric medications at this time - Check lithium level  Bipolar 1 disorder (HCC) - Hold medications at this time - Check Lithium level  Migraine, chronic, without aura - Monitor for headache  Chronic Cough - Hold PPI,  outpatient work up.      DVT prophylaxis: Lovenox Code Status:   Full  Family Communication:  Mother at bedside Disposition Plan:   Patient is from:  Home  Anticipated DC to:  TBD  Anticipated DC date:  12/17/22   Anticipated DC barriers: Improvement in paralysis  Consults called:  Neurology, Dr. Iver Nestle  Admission status:  IP, Telemetry for QTc  Severity of Illness: The appropriate patient status for this patient is INPATIENT. Inpatient status is judged to be reasonable and necessary in order to provide the required intensity of service to ensure the patient's safety. The patient's presenting symptoms, physical exam findings, and initial radiographic and laboratory data in the context of their chronic comorbidities is felt to place them at high risk for further clinical deterioration. Furthermore, it is not anticipated that the patient will be medically stable for discharge from the hospital within 2 midnights of admission.   * I certify that at the point of admission it is my clinical judgment that the patient will require inpatient hospital care spanning beyond 2 midnights from the point of admission due to high intensity of service, high risk for further deterioration and high frequency of surveillance required.Debe Coder MD [facility]  How to contact the [facility] Attending or Consulting provider 7A - 7P or covering provider during after hours 7P -7A, for this patient?   Check the care team in [facility] and look for a) attending/consulting [facility] provider listed and b) the [facility] team listed Log into [email or web address] and use [place]'s universal password to access. If you do not have the password, please contact the hospital operator. Locate the [facility] provider you are looking for under [facility] and page to a number that you can be directly reached. If you still have difficulty reaching the provider, please page the [facility] (Director on Call)  for the Hospitalists listed on amion for  assistance.  12/15/2022, 5:43 PM

## 2022-12-15 NOTE — ED Provider Notes (Signed)
[place] EMERGENCY DEPARTMENT AT [facility] Provider Note   CSN: [id number] Arrival date & time: 12/15/22  1509     History  Chief Complaint  Patient presents with   Altered Mental Status    Bethany [name] is a 47 y.o. female.   Altered Mental Status    Patient presents ED for evaluation of altered mental status.  Patient has history of migraines menorrhagia bipolar disorder.  Patient reportedly had sudden onset of not being able to speak this afternoon.  Patient was known to be normal at her baseline about 1 PM.  EMS reports patient has not communicated verbally at all with them.  They were able to get her to agree to blink 1 time for yes and 2 times for no.  Patient has been responding in that manner.  There has been some intermittent movements of her extremities.  Home Medications Prior to Admission medications   Medication Sig Start Date End Date Taking? Authorizing Provider  acetaminophen (TYLENOL) 650 MG CR tablet Take 1,300 mg by mouth every 8 (eight) hours. Takes it 2 to 3 times per day    [provider]  diazepam (VALIUM) 5 MG tablet SMARTSIG:1 Tablet(s) By Mouth 08/13/22   [provider]  diclofenac Sodium (VOLTAREN) 1 % GEL APPLY 4 G TOPICALLY 4 TIMES A DAY 10/31/22   [name], MD  famotidine (PEPCID) 20 MG tablet Take 1 tablet (20 mg total) by mouth 2 (two) times daily. 12/09/22   Latrelle Dodrill, MD  gabapentin (NEURONTIN) 100 MG capsule Take 1 capsule (100 mg total) by mouth 3 (three) times daily. 10/30/22 12/29/22  Arfeen, Phillips Grout, MD  hydrOXYzine (ATARAX) 50 MG tablet TAKE 1 TABLET BY MOUTH AT BEDTIME AS NEEDED. MAY TAKE UP TO 3 TIMES DAILY. 08/20/22   [name], MD  linaclotide (LINZESS) 145 MCG CAPS capsule TAKE 1 CAPSULE BY MOUTH EVERY DAY BEFORE BREAKFAST - INSURANCE WILL PAY ON 6/25 11/25/22   [name], MD  lithium carbonate (LITHOBID) 300 MG ER tablet Take 1 tablet (300 mg total) by mouth 3 (three) times daily. 10/30/22  12/29/22  Arfeen, Phillips Grout, MD  ondansetron (ZOFRAN) 4 MG tablet Take 1 tablet (4 mg total) by mouth every 8 (eight) hours as needed for nausea or vomiting. 10/11/22   Scheeler, Kermit Balo, PA-C  pantoprazole (PROTONIX) 40 MG tablet Take 1 tablet (40 mg total) by mouth daily. 12/09/22   Latrelle Dodrill, MD  promethazine (PHENERGAN) 12.5 MG tablet TAKE 1 TABLET BY MOUTH EVERY 6 HOURS AS NEEDED FOR NAUSEA OR VOMITING. 06/21/22   [name], MD  QUEtiapine Fumarate 150 MG TABS Take 150 mg by mouth at bedtime. 10/30/22 11/29/22  Arfeen, Phillips Grout, MD  tiZANidine (ZANAFLEX) 2 MG tablet TAKE 1 TO 2 TABLETS (2-4 MG TOTAL) BY MOUTH EVERY 6 HOURS AS NEEDED 07/29/22   [name], MD  traZODone (DESYREL) 100 MG tablet Take 1 tablet (100 mg total) by mouth at bedtime. 10/30/22   Arfeen, Phillips Grout, MD      Allergies    Penicillins, Tapentadol, Lamictal [lamotrigine], Codeine, and Oxycodone hcl    Review of Systems   Review of Systems  Physical Exam Updated Vital Signs BP 125/73 (BP Location: Left Arm)   Pulse 69   Temp 98.4 F (36.9 C) (Oral)   Resp 15   Ht 1.626 m (5\' 4" )   Wt 68.1 kg   SpO2 99%   BMI 25.78 kg/m  Physical Exam  Vitals and nursing note reviewed.  Constitutional:      General: She is in acute distress.     Appearance: She is well-developed.  HENT:     Head: Normocephalic and atraumatic.     Right Ear: External ear normal.     Left Ear: External ear normal.  Eyes:     General: No scleral icterus.       Right eye: No discharge.        Left eye: No discharge.     Conjunctiva/sclera: Conjunctivae normal.  Neck:     Trachea: No tracheal deviation.  Cardiovascular:     Rate and Rhythm: Normal rate and regular rhythm.  Pulmonary:     Effort: Pulmonary effort is normal. No respiratory distress.     Breath sounds: Normal breath sounds. No stridor. No wheezing or rales.  Abdominal:     General: Bowel sounds are normal. There is no distension.     Palpations: Abdomen is soft.      Tenderness: There is no abdominal tenderness. There is no guarding or rebound.  Musculoskeletal:        General: No tenderness or deformity.     Cervical back: Neck supple.  Skin:    General: Skin is warm and dry.     Findings: No rash.  Neurological:     General: No focal deficit present.     Mental Status: She is alert.     GCS: GCS eye subscore is 4. GCS verbal subscore is 1.     Cranial Nerves: No facial asymmetry.     Motor: No abnormal muscle tone.     Comments: Patient not verbally answering any questions, will not smile or stick out her tongue.  She does blink in response to yes or no questions.  Patient will not keep right or left upper and lower extremities off the bed against gravity even when assisted  Psychiatric:        Mood and Affect: Affect is tearful.     ED Results / Procedures / Treatments   Labs (all labs ordered are listed, but only abnormal results are displayed) Labs Reviewed  COMPREHENSIVE METABOLIC PANEL - Abnormal; Notable for the following components:      Result Value   Potassium 2.4 (*)    CO2 20 (*)    Glucose, Bld 126 (*)    BUN 5 (*)    Calcium 8.7 (*)    All other components within normal limits  ETHANOL  PROTIME-INR  APTT  CBC  DIFFERENTIAL  HCG, SERUM, QUALITATIVE  RAPID URINE DRUG SCREEN, HOSP PERFORMED  URINALYSIS, ROUTINE W REFLEX MICROSCOPIC  PHOSPHORUS  CK  MAGNESIUM  TSH  T4, FREE  T3  I-STAT CHEM 8, ED  I-STAT CHEM 8, ED    EKG EKG Interpretation Date/Time:  Sunday December 15 2022 15:22:04 EDT Ventricular Rate:  89 PR Interval:  135 QRS Duration:  83 QT Interval:  422 QTC Calculation: 514 R Axis:   -1  Text Interpretation: Sinus rhythm Abnormal R-wave progression, early transition Abnormal T, consider ischemia, diffuse leads Prolonged QT interval No significant change since last tracing Confirmed by Linwood Dibbles [phone]) on 12/15/2022 3:34:46 PM  Radiology CT ANGIO HEAD NECK W WO CM (CODE STROKE)  Result Date:  12/15/2022 CLINICAL DATA:  Aphasia.  Code stroke EXAM: CT ANGIOGRAPHY HEAD AND NECK WITH AND WITHOUT CONTRAST TECHNIQUE: Multidetector CT imaging of the head and neck was performed using the standard protocol during bolus administration of  intravenous contrast. Multiplanar CT image reconstructions and MIPs were obtained to evaluate the vascular anatomy. Carotid stenosis measurements (when applicable) are obtained utilizing NASCET criteria, using the distal internal carotid diameter as the denominator. RADIATION DOSE REDUCTION: This exam was performed according to the departmental dose-optimization program which includes automated exposure control, adjustment of the mA and/or kV according to patient size and/or use of iterative reconstruction technique. CONTRAST:  60mL OMNIPAQUE IOHEXOL 350 MG/ML SOLN COMPARISON:  None Available. FINDINGS: CTA NECK FINDINGS Aortic arch: Common origin of the left common carotid artery and innominate artery is noted. No significant atherosclerotic disease present at the arch. Great vessel origins are within normal limits. The left vertebral artery tonight's directly from the aortic arch, a normal variant. Right carotid system: Right common carotid artery is within normal limits. Bifurcation is unremarkable. The cervical right ICA is normal. Left carotid system: The left common carotid artery is within normal limits. The bifurcation is unremarkable. The cervical left ICA is normal. Vertebral arteries: The right vertebral artery originates from the subclavian artery without focal stenosis. The vertebral arteries are codominant. No significant stenosis is present in either vertebral artery in the neck. Skeleton: The vertebral body heights and alignment are normal. No focal osseous lesions are present. Other neck: Soft tissues the neck are otherwise unremarkable. Salivary glands are within normal limits. Thyroid is normal. No significant adenopathy is present. No focal mucosal or  submucosal lesions are present. Upper chest: The lung apices are clear. Review of the MIP images confirms the above findings CTA HEAD FINDINGS Anterior circulation: The internal carotid arteries are within normal limits from the skull base to the ICA termini. The A1 and M1 segments are normal. The anterior communicating artery is patent. The MCA bifurcations are intact. The ACA and MCA branch vessels are normal bilaterally. No aneurysm is present. Posterior circulation: The vertebral arteries are codominant. The PICA origin is visualized and normal bilaterally. Vertebrobasilar junction basilar artery is normal. The superior cerebellar arteries patent. Both posterior cerebral arteries originate from basilar tip. The PCA branch vessels are normal bilaterally. Venous sinuses: The dural sinuses are patent. The straight sinus and deep cerebral veins are intact. Cortical veins are within normal limits. No significant vascular malformation is evident. Anatomic variants: None Review of the MIP images confirms the above findings IMPRESSION: 1. Normal variant CTA Circle of Willis without significant proximal stenosis, aneurysm, or branch vessel occlusion. 2. Normal CTA of the neck. Electronically Signed   By: Marin Roberts M.D.   On: 12/15/2022 16:16   CT HEAD CODE STROKE WO CONTRAST  Result Date: 12/15/2022 CLINICAL DATA:  Code stroke. Neuro deficit, acute, stroke suspected. Aphasia. EXAM: CT HEAD WITHOUT CONTRAST TECHNIQUE: Contiguous axial images were obtained from the base of the skull through the vertex without intravenous contrast. RADIATION DOSE REDUCTION: This exam was performed according to the departmental dose-optimization program which includes automated exposure control, adjustment of the mA and/or kV according to patient size and/or use of iterative reconstruction technique. COMPARISON:  CT head without contrast 11/11/2021 FINDINGS: Brain: No acute infarct, hemorrhage, or mass lesion is present. No  significant white matter lesions are present. The ventricles are of normal size. No significant extraaxial fluid collection is present. The brainstem and cerebellum are within normal limits. Midline structures are within normal limits. Vascular: No hyperdense vessel or unexpected calcification. Skull: Calvarium is intact. No focal lytic or blastic lesions are present. No significant extracranial soft tissue lesion is present. Sinuses/Orbits: The paranasal sinuses and mastoid air  cells are clear. The globes and orbits are within normal limits. ASPECTS [facility] Stroke Program Early CT Score) - Ganglionic level infarction (caudate, lentiform nuclei, internal capsule, insula, M1-M3 cortex): 7/7 - Supraganglionic infarction (M4-M6 cortex): 3/3 Total score (0-10 with 10 being normal): 10/10 IMPRESSION: 1. Negative CT of the head. 2. Aspects is 10/10. The above was relayed via text pager to Dr. Otelia Limes on 12/15/2022 at 15:42 . Electronically Signed   By: Marin Roberts M.D.   On: 12/15/2022 15:42    Procedures .Critical Care  Performed by: Linwood Dibbles, MD Authorized by: Linwood Dibbles, MD   Critical care provider statement:    Critical care time (minutes):  30   Critical care was time spent personally by me on the following activities:  Development of treatment plan with patient or surrogate, discussions with consultants, evaluation of patient's response to treatment, examination of patient, ordering and review of laboratory studies, ordering and review of radiographic studies, ordering and performing treatments and interventions, pulse oximetry, re-evaluation of patient's condition and review of old charts     Medications Ordered in ED Medications  sodium chloride 0.9 % bolus 500 mL (has no administration in time range)    Followed by  0.9 %  sodium chloride infusion (has no administration in time range)  potassium chloride SA (KLOR-CON M) CR tablet 40 mEq (has no administration in time range)   potassium chloride 10 mEq in 100 mL IVPB (has no administration in time range)  iohexol (OMNIPAQUE) 350 MG/ML injection 100 mL (60 mLs Intravenous Contrast Given 12/15/22 1608)    ED Course/ Medical Decision Making/ A&P Clinical Course as of 12/15/22 1642  Sun Dec 15, 2022  1617 Case with Dr. Iver Nestle.  CT head and angio not showing any acute abnormality.  No large vessel occlusion.  Patient does have severe hypokalemia of 2.4.  Hypokalemic periodic paralysis is a possibility although this is an atypical age for presentation.  Will proceed with MRI.  Plan on medical admission for potassium replacement for evaluation [JK]  1642 Case discussed with Dr Criselda Peaches [JK]    Clinical Course User Index [JK] Linwood Dibbles, MD                                 Medical Decision Making Differential diagnosis includes but not limited to acute stroke, metabolic derangement, psychologic condition  Problems Addressed: Hypokalemia: acute illness or injury that poses a threat to life or bodily functions Weakness: acute illness or injury that poses a threat to life or bodily functions  Amount and/or Complexity of Data Reviewed Labs: ordered. Decision-making details documented in ED Course. Radiology: ordered and independent interpretation performed.  Risk Prescription drug management. Drug therapy requiring intensive monitoring for toxicity. Decision regarding hospitalization.   Patient presented to the ER for evaluation of acute weakness.  Patient not verbally answering any my questions, not moving any other extremities.  Code stroke was activated on arrival.  EMS however had reported that patient had some movement initially upper extremities.  She had been nonverbal the whole time they assessed her.  Patient seen by Dr. Iver Nestle via telehealth.  No indications for tPA at this time.  CT angio head and neck are negative.  MRI however has been ordered.  Patient's labs do show severe hypokalemia with a potassium is  2.4.  Magnesium has been added.  IV potassium replacement ordered.  Hypokalemic periodic paralysis is in the differential  although this is an unusual age for initial presentation.  Will continue with potassium replacement.  I will consult service for admission for further treatment and evaluation.        Final Clinical Impression(s) / ED Diagnoses Final diagnoses:  Hypokalemia  Weakness    Rx / DC Orders ED Discharge Orders     None         Linwood Dibbles, MD 12/15/22 1642

## 2022-12-16 DIAGNOSIS — E876 Hypokalemia: Secondary | ICD-10-CM | POA: Diagnosis not present

## 2022-12-16 LAB — BASIC METABOLIC PANEL
Anion gap: 15 (ref 5–15)
BUN: <5 mg/dL — ABNORMAL LOW (ref 6–20)
CO2: 17 mmol/L — ABNORMAL LOW (ref 22–32)
Calcium: 8.5 mg/dL — ABNORMAL LOW (ref 8.9–10.3)
Chloride: 106 mmol/L (ref 98–111)
Creatinine, Ser: 0.77 mg/dL (ref 0.44–1.00)
GFR, Estimated: >60 mL/min (ref >60)
Glucose, Bld: 92 mg/dL (ref 70–99)
Potassium: 4 mmol/L (ref 3.5–5.1)
Sodium: 138 mmol/L (ref 135–145)

## 2022-12-16 LAB — PHOSPHORUS: Phosphorus: 1.7 mg/dL — ABNORMAL LOW (ref 2.5–4.6)

## 2022-12-16 LAB — COMPREHENSIVE METABOLIC PANEL
ALT: 18 U/L (ref 0–44)
AST: 18 U/L (ref 15–41)
Albumin: 4.1 g/dL (ref 3.5–5.0)
Alkaline Phosphatase: 49 U/L (ref 38–126)
Anion gap: 8 (ref 5–15)
BUN: <5 mg/dL — ABNORMAL LOW (ref 6–20)
CO2: 20 mmol/L — ABNORMAL LOW (ref 22–32)
Calcium: 8.1 mg/dL — ABNORMAL LOW (ref 8.9–10.3)
Chloride: 106 mmol/L (ref 98–111)
Creatinine, Ser: 0.88 mg/dL (ref 0.44–1.00)
GFR, Estimated: >60 mL/min (ref >60)
Glucose, Bld: 91 mg/dL (ref 70–99)
Potassium: 3.2 mmol/L — ABNORMAL LOW (ref 3.5–5.1)
Sodium: 134 mmol/L — ABNORMAL LOW (ref 135–145)
Total Bilirubin: 0.6 mg/dL (ref 0.3–1.2)
Total Protein: 7.1 g/dL (ref 6.5–8.1)

## 2022-12-16 LAB — T4, FREE: Free T4: 0.77 ng/dL (ref 0.61–1.12)

## 2022-12-16 LAB — LITHIUM LEVEL: Lithium Lvl: <0.06 mmol/L — ABNORMAL LOW (ref 0.60–1.20)

## 2022-12-16 LAB — POTASSIUM: Potassium: 3 mmol/L — ABNORMAL LOW (ref 3.5–5.1)

## 2022-12-16 MED ORDER — CARMEX CLASSIC LIP BALM EX OINT
TOPICAL_OINTMENT | CUTANEOUS | Status: DC | PRN
  Filled 2022-12-16: qty 10

## 2022-12-16 MED ORDER — TRAZODONE HCL 100 MG PO TABS
100.0000 mg | ORAL_TABLET | Freq: Every day | ORAL | Status: DC
  Administered 2022-12-16: 100 mg via ORAL
  Filled 2022-12-16: qty 1

## 2022-12-16 MED ORDER — GUAIFENESIN-DM 100-10 MG/5ML PO SYRP
5.0000 mL | ORAL_SOLUTION | ORAL | Status: DC | PRN
  Administered 2022-12-16 (×3): 5 mL via ORAL
  Filled 2022-12-16 (×3): qty 10

## 2022-12-16 MED ORDER — SODIUM CHLORIDE 0.9 % IV SOLN: INTRAVENOUS | Status: DC

## 2022-12-16 MED ORDER — PANTOPRAZOLE SODIUM 40 MG PO TBEC
40.0000 mg | DELAYED_RELEASE_TABLET | Freq: Every day | ORAL | Status: DC
  Administered 2022-12-17: 40 mg via ORAL
  Filled 2022-12-16: qty 1

## 2022-12-16 MED ORDER — ONDANSETRON HCL 4 MG/2ML IJ SOLN
4.0000 mg | Freq: Four times a day (QID) | INTRAMUSCULAR | Status: DC | PRN
  Filled 2022-12-16: qty 2

## 2022-12-16 MED ORDER — SODIUM PHOSPHATES 45 MMOLE/15ML IV SOLN
30.0000 mmol | Freq: Once | INTRAVENOUS | Status: AC
  Administered 2022-12-16: 30 mmol via INTRAVENOUS
  Filled 2022-12-16: qty 10

## 2022-12-16 MED ORDER — POTASSIUM CHLORIDE 10 MEQ/100ML IV SOLN
10.0000 meq | INTRAVENOUS | Status: AC
  Administered 2022-12-16 (×5): 10 meq via INTRAVENOUS
  Filled 2022-12-16 (×5): qty 100

## 2022-12-16 MED ORDER — LITHIUM CARBONATE ER 300 MG PO TBCR
300.0000 mg | EXTENDED_RELEASE_TABLET | Freq: Three times a day (TID) | ORAL | Status: DC
  Administered 2022-12-16 – 2022-12-17 (×4): 300 mg via ORAL
  Filled 2022-12-16 (×4): qty 1

## 2022-12-16 MED ORDER — GABAPENTIN 100 MG PO CAPS
100.0000 mg | ORAL_CAPSULE | Freq: Three times a day (TID) | ORAL | Status: DC
  Administered 2022-12-16 – 2022-12-17 (×4): 100 mg via ORAL
  Filled 2022-12-16 (×4): qty 1

## 2022-12-16 NOTE — Plan of Care (Signed)
  Problem: Activity: Goal: Risk for activity intolerance will decrease Outcome: Progressing   Problem: Coping: Goal: Level of anxiety will decrease Outcome: Progressing   Problem: Safety: Goal: Ability to remain free from injury will improve Outcome: Progressing   

## 2022-12-16 NOTE — Progress Notes (Signed)
PROGRESS NOTE    Bethany [name]  ONG:[id number] DOB: 1975-09-14 DOA: 12/15/2022 PCP: [name], MD   Brief Narrative: 47 year old with past medical history significant for bipolar, migraine, elevated A1c, anemia, history of breast cancer presented with unresponsiveness, catatonia, hypokalemia.  Patient presented alert, she moves eyes to voice, she will follow some commands.  She blinks once to yes and twice for now.  Decreased sensation all over.  Mother found her in this condition after she returned from discharge.  Patient reports some diarrhea for the last 2 weeks on and off.  Was found to have severe hypokalemia potassium of 2.2.   Assessment & Plan:   Active Problems:   Migraine, chronic, without aura   Bipolar 1 disorder (HCC)   Hypokalemia   1-Hypokalemic periodic paralysis Presents with lethargy, altered mental status, potassium 2.2. She is back to baseline this morning after potassium supplementation. She received IV and oral potassium. Plan to repeat be met this afternoon.  Hypophosphatemia: ; check phosphorus today. Received supplement.   Acute metabolic encephalopathy in the setting of hypokalemia: Resolved  Low TSH: Follow free T3: -T4: 0.77  Diarrhea;  Check C diff, GI pathogen.   Bipolar: Resume lithium, will check level.   Chronic cough: Resume PPI  Estimated body mass index is 25.78 kg/m as calculated from the following:   Height as of this encounter: 5\' 4"  (1.626 m).   Weight as of this encounter: 68.1 kg.   DVT prophylaxis: Lovenox Code Status: Full code Family Communication: Care discussed with patient.  Disposition Plan:  Status is: Inpatient Remains inpatient appropriate because: management of encephalopathy, hypokalemia    Consultants:    Procedures:  none  Antimicrobials:    Subjective: She is alert, conversant, feels back to baseline.   Objective: Vitals:   12/15/22 1821 12/15/22 2140 12/16/22 0030 12/16/22 0504  BP: (!)  140/82 130/76 118/73 132/84  Pulse: 64 71 72 73  Resp: 18 18 18 19   Temp: 98.4 F (36.9 C) 98.2 F (36.8 C) 98.7 F (37.1 C) 98.5 F (36.9 C)  TempSrc: Oral Oral Oral Oral  SpO2: 100% 100% 100% 100%  Weight:      Height:       No intake or output data in the 24 hours ending 12/16/22 0938 Filed Weights   12/15/22 1520  Weight: 68.1 kg    Examination:  General exam: Appears calm and comfortable  Respiratory system: Clear to auscultation. Respiratory effort normal. Cardiovascular system: S1 & S2 heard, RRR. No JVD, murmurs, rubs, gallops or clicks. No pedal edema. Gastrointestinal system: Abdomen is nondistended, soft and nontender. No organomegaly or masses felt. Normal bowel sounds heard. Central nervous system: Alert and oriented. No focal neurological deficits. Extremities: Symmetric 5 x 5 power.    Data Reviewed: I have personally reviewed following labs and imaging studies  CBC: Recent Labs  Lab 12/15/22 1531 12/15/22 1959 12/15/22 2325  WBC 6.1 5.3 5.5  NEUTROABS 4.1  --   --   HGB 12.9 12.1 12.1  HCT 39.2 37.8 37.6  MCV 87.1 89.4 88.5  PLT 251 243 233   Basic Metabolic Panel: Recent Labs  Lab 12/15/22 1531 12/15/22 1736 12/15/22 1959 12/15/22 2324 12/15/22 2325 12/16/22 0434  NA 136  --   --   --  134*  --   K 2.4*  --  3.1* 3.2* 3.2* 3.0*  CL 104  --   --   --  106  --   CO2 20*  --   --   --  20*  --   GLUCOSE 126*  --   --   --  91  --   BUN 5*  --   --   --  <5*  --   CREATININE 0.94  --  0.66  --  0.88  --   CALCIUM 8.7*  --   --   --  8.1*  --   MG  --  2.0  --   --   --   --   PHOS  --  2.4*  --   --   --   --    GFR: Estimated Creatinine Clearance: 74.2 mL/min (by C-G formula based on SCr of 0.88 mg/dL). Liver Function Tests: Recent Labs  Lab 12/15/22 1531 12/15/22 2325  AST 24 18  ALT 20 18  ALKPHOS 54 49  BILITOT 0.5 0.6  PROT 7.8 7.1  ALBUMIN 4.4 4.1   No results for input(s): "LIPASE", "AMYLASE" in the last 168 hours. No  results for input(s): "AMMONIA" in the last 168 hours. Coagulation Profile: Recent Labs  Lab 12/15/22 1531  INR 1.0   Cardiac Enzymes: Recent Labs  Lab 12/15/22 1736  CKTOTAL 191   BNP (last 3 results) No results for input(s): "PROBNP" in the last 8760 hours. HbA1C: No results for input(s): "HGBA1C" in the last 72 hours. CBG: Recent Labs  Lab 12/15/22 1526  GLUCAP 126*   Lipid Profile: No results for input(s): "CHOL", "HDL", "LDLCALC", "TRIG", "CHOLHDL", "LDLDIRECT" in the last 72 hours. Thyroid Function Tests: Recent Labs    12/15/22 1733 12/15/22 1736  TSH  --  0.291*  FREET4 0.77  --    Anemia Panel: No results for input(s): "VITAMINB12", "FOLATE", "FERRITIN", "TIBC", "IRON", "RETICCTPCT" in the last 72 hours. Sepsis Labs: No results for input(s): "PROCALCITON", "LATICACIDVEN" in the last 168 hours.  No results found for this or any previous visit (from the past 240 hour(s)).       Radiology Studies: MR BRAIN WO CONTRAST  Result Date: 12/15/2022 CLINICAL DATA:  Neuro deficit, acute, stroke suspected. Decreased with spots of miss. Paralysis. EXAM: MRI HEAD WITHOUT CONTRAST TECHNIQUE: Multiplanar, multiecho pulse sequences of the brain and surrounding structures were obtained without intravenous contrast. COMPARISON:  Head CT and CTA 12/15/2022 FINDINGS: Brain: There is no evidence of an acute infarct, intracranial hemorrhage, mass, midline shift, or extra-axial fluid collection. The ventricles and sulci are normal. The brain is normal in signal. Vascular: Major intracranial vascular flow voids are preserved. Skull and upper cervical spine: Unremarkable bone marrow signal. Sinuses/Orbits: Unremarkable orbits. Mild mucosal thickening in the paranasal sinuses. Clear mastoid air cells. Other: None. IMPRESSION: Unremarkable appearance of the brain. Electronically Signed   By: Sebastian Ache M.D.   On: 12/15/2022 19:29   CT ANGIO HEAD NECK W WO CM (CODE STROKE)  Result  Date: 12/15/2022 CLINICAL DATA:  Aphasia.  Code stroke EXAM: CT ANGIOGRAPHY HEAD AND NECK WITH AND WITHOUT CONTRAST TECHNIQUE: Multidetector CT imaging of the head and neck was performed using the standard protocol during bolus administration of intravenous contrast. Multiplanar CT image reconstructions and MIPs were obtained to evaluate the vascular anatomy. Carotid stenosis measurements (when applicable) are obtained utilizing NASCET criteria, using the distal internal carotid diameter as the denominator. RADIATION DOSE REDUCTION: This exam was performed according to the departmental dose-optimization program which includes automated exposure control, adjustment of the mA and/or kV according to patient size and/or use of iterative reconstruction technique. CONTRAST:  60mL OMNIPAQUE IOHEXOL 350 MG/ML SOLN COMPARISON:  None Available. FINDINGS: CTA NECK FINDINGS Aortic arch: Common origin of the left common carotid artery and innominate artery is noted. No significant atherosclerotic disease present at the arch. Great vessel origins are within normal limits. The left vertebral artery tonight's directly from the aortic arch, a normal variant. Right carotid system: Right common carotid artery is within normal limits. Bifurcation is unremarkable. The cervical right ICA is normal. Left carotid system: The left common carotid artery is within normal limits. The bifurcation is unremarkable. The cervical left ICA is normal. Vertebral arteries: The right vertebral artery originates from the subclavian artery without focal stenosis. The vertebral arteries are codominant. No significant stenosis is present in either vertebral artery in the neck. Skeleton: The vertebral body heights and alignment are normal. No focal osseous lesions are present. Other neck: Soft tissues the neck are otherwise unremarkable. Salivary glands are within normal limits. Thyroid is normal. No significant adenopathy is present. No focal mucosal or  submucosal lesions are present. Upper chest: The lung apices are clear. Review of the MIP images confirms the above findings CTA HEAD FINDINGS Anterior circulation: The internal carotid arteries are within normal limits from the skull base to the ICA termini. The A1 and M1 segments are normal. The anterior communicating artery is patent. The MCA bifurcations are intact. The ACA and MCA branch vessels are normal bilaterally. No aneurysm is present. Posterior circulation: The vertebral arteries are codominant. The PICA origin is visualized and normal bilaterally. Vertebrobasilar junction basilar artery is normal. The superior cerebellar arteries patent. Both posterior cerebral arteries originate from basilar tip. The PCA branch vessels are normal bilaterally. Venous sinuses: The dural sinuses are patent. The straight sinus and deep cerebral veins are intact. Cortical veins are within normal limits. No significant vascular malformation is evident. Anatomic variants: None Review of the MIP images confirms the above findings IMPRESSION: 1. Normal variant CTA Circle of Willis without significant proximal stenosis, aneurysm, or branch vessel occlusion. 2. Normal CTA of the neck. Electronically Signed   By: Marin Roberts M.D.   On: 12/15/2022 16:16   CT HEAD CODE STROKE WO CONTRAST  Result Date: 12/15/2022 CLINICAL DATA:  Code stroke. Neuro deficit, acute, stroke suspected. Aphasia. EXAM: CT HEAD WITHOUT CONTRAST TECHNIQUE: Contiguous axial images were obtained from the base of the skull through the vertex without intravenous contrast. RADIATION DOSE REDUCTION: This exam was performed according to the departmental dose-optimization program which includes automated exposure control, adjustment of the mA and/or kV according to patient size and/or use of iterative reconstruction technique. COMPARISON:  CT head without contrast 11/11/2021 FINDINGS: Brain: No acute infarct, hemorrhage, or mass lesion is present. No  significant white matter lesions are present. The ventricles are of normal size. No significant extraaxial fluid collection is present. The brainstem and cerebellum are within normal limits. Midline structures are within normal limits. Vascular: No hyperdense vessel or unexpected calcification. Skull: Calvarium is intact. No focal lytic or blastic lesions are present. No significant extracranial soft tissue lesion is present. Sinuses/Orbits: The paranasal sinuses and mastoid air cells are clear. The globes and orbits are within normal limits. ASPECTS [facility] Stroke Program Early CT Score) - Ganglionic level infarction (caudate, lentiform nuclei, internal capsule, insula, M1-M3 cortex): 7/7 - Supraganglionic infarction (M4-M6 cortex): 3/3 Total score (0-10 with 10 being normal): 10/10 IMPRESSION: 1. Negative CT of the head. 2. Aspects is 10/10. The above was relayed via text pager to Dr. Otelia Limes on 12/15/2022 at 15:42 . Electronically Signed   By: Cristal Deer  Mattern M.D.   On: 12/15/2022 15:42        Scheduled Meds:  enoxaparin (LOVENOX) injection  40 mg Subcutaneous QHS   Continuous Infusions:  0.9 % NaCl with KCl 40 mEq / L 100 mL/hr at 12/15/22 2139   potassium chloride 10 mEq (12/16/22 0729)     LOS: 1 day    Time spent: 35 minutes    Adyen Bifulco A Ulises Wolfinger, MD [facility]   If 7PM-7AM, please contact night-coverage [email or web address]  12/16/2022, 9:38 AM

## 2022-12-16 NOTE — Plan of Care (Addendum)
°  Problem: Clinical Measurements: °Goal: Ability to maintain clinical measurements within normal limits will improve °Outcome: Progressing °  °Problem: Activity: °Goal: Risk for activity intolerance will decrease °Outcome: Progressing °  °Problem: Nutrition: °Goal: Adequate nutrition will be maintained °Outcome: Progressing °  °

## 2022-12-16 NOTE — TOC CM/SW Note (Signed)
Transition of Care [facility]) - Inpatient Brief Assessment   Patient Details  Name: Bethany [name] MRN: [id number] Date of Birth: 1976/04/08  Transition of [facility]) CM/SW Contact:    Howell Rucks, RN Phone Number: 12/16/2022, 11:58 AM   Clinical Narrative: Met with pt at bedside to introduce role of TOC/NCM and review for dc planning, pt's mother at bedside providing information, pt asleep during assessment. Pt's mother reports pt has  a PCP and pharmacy in place, no current home care services or DME, confirmed she will provide support for pt and dc and provide transportation. TOC Brief Assessment completed. No TOC needs identified.     Transition of Care Asessment: Insurance and Status: Insurance coverage has been reviewed Patient has primary care physician: Yes Home environment has been reviewed: private residence with children, support from her mother Prior level of function:: Independent Prior/Current Home Services: No current home services Social Determinants of Health Reivew: SDOH reviewed no interventions necessary Readmission risk has been reviewed: Yes Transition of care needs: no transition of care needs at this time

## 2022-12-17 DIAGNOSIS — E876 Hypokalemia: Secondary | ICD-10-CM | POA: Diagnosis not present

## 2022-12-17 LAB — PHOSPHORUS: Phosphorus: 4.6 mg/dL (ref 2.5–4.6)

## 2022-12-17 LAB — BASIC METABOLIC PANEL
Anion gap: 7 (ref 5–15)
Anion gap: 10 (ref 5–15)
BUN: 6 mg/dL (ref 6–20)
BUN: <5 mg/dL — ABNORMAL LOW (ref 6–20)
CO2: 24 mmol/L (ref 22–32)
CO2: 21 mmol/L — ABNORMAL LOW (ref 22–32)
Calcium: 8.4 mg/dL — ABNORMAL LOW (ref 8.9–10.3)
Calcium: 9 mg/dL (ref 8.9–10.3)
Chloride: 106 mmol/L (ref 98–111)
Chloride: 106 mmol/L (ref 98–111)
Creatinine, Ser: 0.77 mg/dL (ref 0.44–1.00)
Creatinine, Ser: 0.87 mg/dL (ref 0.44–1.00)
GFR, Estimated: >60 mL/min (ref >60)
GFR, Estimated: >60 mL/min (ref >60)
Glucose, Bld: 119 mg/dL — ABNORMAL HIGH (ref 70–99)
Glucose, Bld: 142 mg/dL — ABNORMAL HIGH (ref 70–99)
Potassium: 3 mmol/L — ABNORMAL LOW (ref 3.5–5.1)
Potassium: 4.2 mmol/L (ref 3.5–5.1)
Sodium: 137 mmol/L (ref 135–145)
Sodium: 137 mmol/L (ref 135–145)

## 2022-12-17 LAB — MAGNESIUM: Magnesium: 1.9 mg/dL (ref 1.7–2.4)

## 2022-12-17 MED ORDER — POTASSIUM CHLORIDE CRYS ER 20 MEQ PO TBCR: 40.0000 meq | EXTENDED_RELEASE_TABLET | Freq: Every day | ORAL | 0 refills | Status: DC

## 2022-12-17 MED ORDER — POTASSIUM CHLORIDE CRYS ER 20 MEQ PO TBCR
40.0000 meq | EXTENDED_RELEASE_TABLET | ORAL | Status: AC
  Administered 2022-12-17 (×2): 40 meq via ORAL
  Filled 2022-12-17 (×2): qty 2

## 2022-12-17 NOTE — Progress Notes (Signed)
Mobility Specialist - Progress Note   12/17/22 1153  Mobility  Activity Ambulated with assistance in hallway  Level of Assistance Modified independent, requires aide device or extra time  Assistive Device Other (Comment) (Hallway Rails)  Distance Ambulated (ft) 150 ft  Range of Motion/Exercises Active  Activity Response Tolerated well  Mobility Referral Yes  $Mobility charge 1 Mobility  Mobility Specialist Start Time (ACUTE ONLY) 1140  Mobility Specialist Stop Time (ACUTE ONLY) 1153  Mobility Specialist Time Calculation (min) (ACUTE ONLY) 13 min   Pt was found sitting EOB and agreeable to ambulate. Stated feeling weak and tingling sensation on her toes and feet. At EOS returned to sit EOB with all needs met. Call bell in reach and family in room.  Bethany [name] Mobility Specialist

## 2022-12-17 NOTE — Discharge Instructions (Signed)
You need lab work by end of week to follow potassium level.

## 2022-12-17 NOTE — Progress Notes (Signed)
While performing AM assessment, pt states she continues to have "numbness and tingling" to bilateral toes since Sunday 12/15/22. Denied any other neurological symptoms, neuro exam normal otherwise. RN gave patient KCl tablets and explained reason for needing to replete potassium (potassium affects muscle coordination). RN stepped outside of room briefly to grab applesauce for pills. Upon returning, pt told RN, "my mouth locked up while you were gone." Pt stated her mouth became very "stiff" and pt had to "pry it open." Pt stated she had numbness and tingling in mouth also. Pt was talking to RN normally at that time. Pt stated she immediately felt better after taking KCl tablets. MD [place] aware. Will continue to monitor closely.

## 2022-12-17 NOTE — Discharge Summary (Signed)
Physician Discharge Summary   Patient: Bethany [name] MRN: [id number] DOB: April 19, 1976  Admit date:     12/15/2022  Discharge date: 12/17/22  Discharge Physician: Alba Cory   PCP: [name], MD   Recommendations at discharge:   Needs Bmet. To follow Potassium level.   Discharge Diagnoses: Active Problems:   Migraine, chronic, without aura   Bipolar 1 disorder (HCC)   Hypokalemia  Resolved Problems:   * No resolved hospital problems. *  Hospital Course: 47 year old with past medical history significant for bipolar, migraine, elevated A1c, anemia, history of breast cancer presented with unresponsiveness, catatonia, hypokalemia.  Patient presented alert, she moves eyes to voice, she will follow some commands.  She blinks once to yes and twice for now.  Decreased sensation all over.  Mother found her in this condition after she returned from discharge.  Patient reports some diarrhea for the last 2 weeks on and off.  Was found to have severe hypokalemia potassium of 2.2.     Assessment and Plan: 1-Hypokalemic periodic paralysis:  Presents with lethargy, altered mental status, potassium 2.2. She is back to baseline. Report an episode of contraction of her mouth, which resolved.  She received IV and oral potassium. K at 4. Stable for discharge. She will be discharge on potasium supplement.  Suspect hypokalemia from diarrhea.    Hypophosphatemia: replaced.    Acute metabolic encephalopathy in the setting of hypokalemia: Resolved   Low TSH: Follow free T3: -T4: 0.77   Diarrhea;  Resolved, stool sample not send because diarrhea resolved.    Bipolar: Resume lithium. Lithium level not elevated   Chronic cough: Resume PPI        Consultants: None Procedures performed:None Disposition: Home Diet recommendation:  Discharge Diet Orders (From admission, onward)     Start     Ordered   12/17/22 0000  Diet general        12/17/22 1408           Regular  diet DISCHARGE MEDICATION: Allergies as of 12/17/2022       Reactions   Penicillins Hives, Nausea And Vomiting   Tapentadol Hives, Other (See Comments)   Name brand is NUCYNTA    Lamictal [lamotrigine] Rash   Codeine Hives   Oxycodone Hcl Hives        Medication List     STOP taking these medications    tiZANidine 2 MG tablet Commonly known as: ZANAFLEX       TAKE these medications    acetaminophen 650 MG CR tablet Commonly known as: TYLENOL Take 1,300 mg by mouth every 8 (eight) hours as needed for pain (or headaches).   diclofenac Sodium 1 % Gel Commonly known as: VOLTAREN APPLY 4 G TOPICALLY 4 TIMES A DAY What changed: See the new instructions.   famotidine 20 MG tablet Commonly known as: Pepcid Take 1 tablet (20 mg total) by mouth 2 (two) times daily.   gabapentin 100 MG capsule Commonly known as: Neurontin Take 1 capsule (100 mg total) by mouth 3 (three) times daily.   hydrOXYzine 50 MG tablet Commonly known as: ATARAX TAKE 1 TABLET BY MOUTH AT BEDTIME AS NEEDED. MAY TAKE UP TO 3 TIMES DAILY. What changed: See the new instructions.   lidocaine 4 % Place 1 patch onto the skin daily as needed (for pain- affected area).   Linzess 145 MCG Caps capsule Generic drug: linaclotide TAKE 1 CAPSULE BY MOUTH EVERY DAY BEFORE BREAKFAST - INSURANCE WILL PAY ON 6/25  What changed: See the new instructions.   lithium carbonate 300 MG ER tablet Commonly known as: Lithobid Take 1 tablet (300 mg total) by mouth 3 (three) times daily.   ondansetron 4 MG tablet Commonly known as: Zofran Take 1 tablet (4 mg total) by mouth every 8 (eight) hours as needed for nausea or vomiting.   pantoprazole 40 MG tablet Commonly known as: PROTONIX Take 1 tablet (40 mg total) by mouth daily. What changed: when to take this   potassium chloride SA 20 MEQ tablet Commonly known as: KLOR-CON M Take 2 tablets (40 mEq total) by mouth daily for 5 days.   promethazine 12.5 MG  tablet Commonly known as: PHENERGAN TAKE 1 TABLET BY MOUTH EVERY 6 HOURS AS NEEDED FOR NAUSEA OR VOMITING. What changed: reasons to take this   QUEtiapine Fumarate 150 MG Tabs Take 150 mg by mouth at bedtime.   traZODone 100 MG tablet Commonly known as: DESYREL Take 1 tablet (100 mg total) by mouth at bedtime.        Discharge Exam: Filed Weights   12/15/22 1520  Weight: 68.1 kg   General; NAD  Condition at discharge: stable  The results of significant diagnostics from this hospitalization (including imaging, microbiology, ancillary and laboratory) are listed below for reference.   Imaging Studies: MR BRAIN WO CONTRAST  Result Date: 12/15/2022 CLINICAL DATA:  Neuro deficit, acute, stroke suspected. Decreased with spots of miss. Paralysis. EXAM: MRI HEAD WITHOUT CONTRAST TECHNIQUE: Multiplanar, multiecho pulse sequences of the brain and surrounding structures were obtained without intravenous contrast. COMPARISON:  Head CT and CTA 12/15/2022 FINDINGS: Brain: There is no evidence of an acute infarct, intracranial hemorrhage, mass, midline shift, or extra-axial fluid collection. The ventricles and sulci are normal. The brain is normal in signal. Vascular: Major intracranial vascular flow voids are preserved. Skull and upper cervical spine: Unremarkable bone marrow signal. Sinuses/Orbits: Unremarkable orbits. Mild mucosal thickening in the paranasal sinuses. Clear mastoid air cells. Other: None. IMPRESSION: Unremarkable appearance of the brain. Electronically Signed   By: Sebastian Ache M.D.   On: 12/15/2022 19:29   CT ANGIO HEAD NECK W WO CM (CODE STROKE)  Result Date: 12/15/2022 CLINICAL DATA:  Aphasia.  Code stroke EXAM: CT ANGIOGRAPHY HEAD AND NECK WITH AND WITHOUT CONTRAST TECHNIQUE: Multidetector CT imaging of the head and neck was performed using the standard protocol during bolus administration of intravenous contrast. Multiplanar CT image reconstructions and MIPs were obtained to  evaluate the vascular anatomy. Carotid stenosis measurements (when applicable) are obtained utilizing NASCET criteria, using the distal internal carotid diameter as the denominator. RADIATION DOSE REDUCTION: This exam was performed according to the departmental dose-optimization program which includes automated exposure control, adjustment of the mA and/or kV according to patient size and/or use of iterative reconstruction technique. CONTRAST:  60mL OMNIPAQUE IOHEXOL 350 MG/ML SOLN COMPARISON:  None Available. FINDINGS: CTA NECK FINDINGS Aortic arch: Common origin of the left common carotid artery and innominate artery is noted. No significant atherosclerotic disease present at the arch. Great vessel origins are within normal limits. The left vertebral artery tonight's directly from the aortic arch, a normal variant. Right carotid system: Right common carotid artery is within normal limits. Bifurcation is unremarkable. The cervical right ICA is normal. Left carotid system: The left common carotid artery is within normal limits. The bifurcation is unremarkable. The cervical left ICA is normal. Vertebral arteries: The right vertebral artery originates from the subclavian artery without focal stenosis. The vertebral arteries are codominant. No  significant stenosis is present in either vertebral artery in the neck. Skeleton: The vertebral body heights and alignment are normal. No focal osseous lesions are present. Other neck: Soft tissues the neck are otherwise unremarkable. Salivary glands are within normal limits. Thyroid is normal. No significant adenopathy is present. No focal mucosal or submucosal lesions are present. Upper chest: The lung apices are clear. Review of the MIP images confirms the above findings CTA HEAD FINDINGS Anterior circulation: The internal carotid arteries are within normal limits from the skull base to the ICA termini. The A1 and M1 segments are normal. The anterior communicating artery is  patent. The MCA bifurcations are intact. The ACA and MCA branch vessels are normal bilaterally. No aneurysm is present. Posterior circulation: The vertebral arteries are codominant. The PICA origin is visualized and normal bilaterally. Vertebrobasilar junction basilar artery is normal. The superior cerebellar arteries patent. Both posterior cerebral arteries originate from basilar tip. The PCA branch vessels are normal bilaterally. Venous sinuses: The dural sinuses are patent. The straight sinus and deep cerebral veins are intact. Cortical veins are within normal limits. No significant vascular malformation is evident. Anatomic variants: None Review of the MIP images confirms the above findings IMPRESSION: 1. Normal variant CTA Circle of Willis without significant proximal stenosis, aneurysm, or branch vessel occlusion. 2. Normal CTA of the neck. Electronically Signed   By: Marin Roberts M.D.   On: 12/15/2022 16:16   CT HEAD CODE STROKE WO CONTRAST  Result Date: 12/15/2022 CLINICAL DATA:  Code stroke. Neuro deficit, acute, stroke suspected. Aphasia. EXAM: CT HEAD WITHOUT CONTRAST TECHNIQUE: Contiguous axial images were obtained from the base of the skull through the vertex without intravenous contrast. RADIATION DOSE REDUCTION: This exam was performed according to the departmental dose-optimization program which includes automated exposure control, adjustment of the mA and/or kV according to patient size and/or use of iterative reconstruction technique. COMPARISON:  CT head without contrast 11/11/2021 FINDINGS: Brain: No acute infarct, hemorrhage, or mass lesion is present. No significant white matter lesions are present. The ventricles are of normal size. No significant extraaxial fluid collection is present. The brainstem and cerebellum are within normal limits. Midline structures are within normal limits. Vascular: No hyperdense vessel or unexpected calcification. Skull: Calvarium is intact. No focal  lytic or blastic lesions are present. No significant extracranial soft tissue lesion is present. Sinuses/Orbits: The paranasal sinuses and mastoid air cells are clear. The globes and orbits are within normal limits. ASPECTS [facility] Stroke Program Early CT Score) - Ganglionic level infarction (caudate, lentiform nuclei, internal capsule, insula, M1-M3 cortex): 7/7 - Supraganglionic infarction (M4-M6 cortex): 3/3 Total score (0-10 with 10 being normal): 10/10 IMPRESSION: 1. Negative CT of the head. 2. Aspects is 10/10. The above was relayed via text pager to Dr. Otelia Limes on 12/15/2022 at 15:42 . Electronically Signed   By: Marin Roberts M.D.   On: 12/15/2022 15:42    Microbiology: Results for orders placed or performed during the hospital encounter of 04/19/22  Resp panel by RT-PCR (RSV, Flu A&B, Covid) Anterior Nasal Swab     Status: None   Collection Time: 04/19/22 12:15 PM   Specimen: Anterior Nasal Swab  Result Value Ref Range Status   SARS Coronavirus 2 by RT PCR NEGATIVE NEGATIVE Final    Comment: (NOTE) SARS-CoV-2 target nucleic acids are NOT DETECTED.  The SARS-CoV-2 RNA is generally detectable in upper respiratory specimens during the acute phase of infection. The lowest concentration of SARS-CoV-2 viral copies this assay can detect  is 138 copies/mL. A negative result does not preclude SARS-Cov-2 infection and should not be used as the sole basis for treatment or other patient management decisions. A negative result may occur with  improper specimen collection/handling, submission of specimen other than nasopharyngeal swab, presence of viral mutation(s) within the areas targeted by this assay, and inadequate number of viral copies(<138 copies/mL). A negative result must be combined with clinical observations, patient history, and epidemiological information. The expected result is Negative.  Fact Sheet for Patients:  BloggerCourse.com  Fact Sheet for  Healthcare Providers:  SeriousBroker.it  This test is no t yet approved or cleared by the [place] FDA and  has been authorized for detection and/or diagnosis of SARS-CoV-2 by FDA under an Emergency Use Authorization (EUA). This EUA will remain  in effect (meaning this test can be used) for the duration of the COVID-19 declaration under Section 564(b)(1) of the Act, 21 U.S.C.section 360bbb-3(b)(1), unless the authorization is terminated  or revoked sooner.       Influenza A by PCR NEGATIVE NEGATIVE Final   Influenza B by PCR NEGATIVE NEGATIVE Final    Comment: (NOTE) The Xpert Xpress SARS-CoV-2/FLU/RSV plus assay is intended as an aid in the diagnosis of influenza from Nasopharyngeal swab specimens and should not be used as a sole basis for treatment. Nasal washings and aspirates are unacceptable for Xpert Xpress SARS-CoV-2/FLU/RSV testing.  Fact Sheet for Patients: BloggerCourse.com  Fact Sheet for Healthcare Providers: SeriousBroker.it  This test is not yet approved or cleared by the [place] FDA and has been authorized for detection and/or diagnosis of SARS-CoV-2 by FDA under an Emergency Use Authorization (EUA). This EUA will remain in effect (meaning this test can be used) for the duration of the COVID-19 declaration under Section 564(b)(1) of the Act, 21 U.S.C. section 360bbb-3(b)(1), unless the authorization is terminated or revoked.     Resp Syncytial Virus by PCR NEGATIVE NEGATIVE Final    Comment: (NOTE) Fact Sheet for Patients: BloggerCourse.com  Fact Sheet for Healthcare Providers: SeriousBroker.it  This test is not yet approved or cleared by the [place] FDA and has been authorized for detection and/or diagnosis of SARS-CoV-2 by FDA under an Emergency Use Authorization (EUA). This EUA will remain in effect (meaning this  test can be used) for the duration of the COVID-19 declaration under Section 564(b)(1) of the Act, 21 U.S.C. section 360bbb-3(b)(1), unless the authorization is terminated or revoked.  Performed at [facility], [address] [place], [place] [address]     Labs: CBC: Recent Labs  Lab 12/15/22 1531 12/15/22 1959 12/15/22 2325  WBC 6.1 5.3 5.5  NEUTROABS 4.1  --   --   HGB 12.9 12.1 12.1  HCT 39.2 37.8 37.6  MCV 87.1 89.4 88.5  PLT 251 243 233   Basic Metabolic Panel: Recent Labs  Lab 12/15/22 1531 12/15/22 1736 12/15/22 1959 12/15/22 2324 12/15/22 2325 12/16/22 0434 12/16/22 1243 12/17/22 0404 12/17/22 1259  NA 136  --   --   --  134*  --  138 137 137  K 2.4*  --  3.1*   < > 3.2* 3.0* 4.0 3.0* 4.2  CL 104  --   --   --  106  --  106 106 106  CO2 20*  --   --   --  20*  --  17* 24 21*  GLUCOSE 126*  --   --   --  91  --  92 119* 142*  BUN 5*  --   --   --  <5*  --  <5* 6 <5*  CREATININE 0.94  --  0.66  --  0.88  --  0.77 0.77 0.87  CALCIUM 8.7*  --   --   --  8.1*  --  8.5* 8.4* 9.0  MG  --  2.0  --   --   --   --   --  1.9  --   PHOS  --  2.4*  --   --   --   --  1.7* 4.6  --    < > = values in this interval not displayed.   Liver Function Tests: Recent Labs  Lab 12/15/22 1531 12/15/22 2325  AST 24 18  ALT 20 18  ALKPHOS 54 49  BILITOT 0.5 0.6  PROT 7.8 7.1  ALBUMIN 4.4 4.1   CBG: Recent Labs  Lab 12/15/22 1526  GLUCAP 126*    Discharge time spent: greater than 30 minutes.  Signed: Alba Cory, MD [facility] 12/17/2022

## 2022-12-18 ENCOUNTER — Telehealth: Payer: Self-pay

## 2022-12-18 LAB — T3: T3, Total: 139 ng/dL (ref 71–180)

## 2022-12-18 NOTE — Telephone Encounter (Signed)
Noted patient was admitted to [facility] for AMS - will defer answering this message Latrelle Dodrill, MD

## 2022-12-18 NOTE — Transitions of Care (Post Inpatient/ED Visit) (Signed)
   12/18/2022  Name: Bethany [name] MRN: [id number] DOB: 1976/03/14  Today's TOC FU Call Status: Today's TOC FU Call Status:: Successful TOC FU Call Completed TOC FU Call Complete Date: 12/18/22 Patient's Name and Date of Birth confirmed.  Transition Care Management Follow-up Telephone Call Date of Discharge: 12/17/22 Discharge Facility: [facility]) Type of Discharge: Inpatient Admission Primary Inpatient Discharge Diagnosis:: hypokalemia How have you been since you were released from the hospital?: Better Any questions or concerns?: No  Items Reviewed: Did you receive and understand the discharge instructions provided?: Yes Medications obtained,verified, and reconciled?: Yes (Medications Reviewed) Any new allergies since your discharge?: No Dietary orders reviewed?: Yes Do you have support at home?: Yes People in Home: child(ren), adult, parent(s)  Medications Reviewed Today: Medications Reviewed Today   Medications were not reviewed in this encounter     Home Care and Equipment/Supplies: Were Home Health Services Ordered?: NA Any new equipment or medical supplies ordered?: NA  Functional Questionnaire: Do you need assistance with bathing/showering or dressing?: No Do you need assistance with meal preparation?: No Do you need assistance with eating?: No Do you have difficulty maintaining continence: No Do you need assistance with getting out of bed/getting out of a chair/moving?: No Do you have difficulty managing or taking your medications?: No  Follow up appointments reviewed: PCP Follow-up appointment confirmed?: Yes Date of PCP follow-up appointment?: 12/24/22 Follow-up Provider: [facility] Follow-up appointment confirmed?: NA Do you need transportation to your follow-up appointment?: No Do you understand care options if your condition(s) worsen?: Yes-patient verbalized understanding    SIGNATURE Karena Addison, LPN [facility] Nurse Health  Advisor Direct Dial [phone]

## 2022-12-20 ENCOUNTER — Encounter (HOSPITAL_COMMUNITY): Payer: Self-pay | Admitting: Clinical

## 2022-12-20 ENCOUNTER — Ambulatory Visit (INDEPENDENT_AMBULATORY_CARE_PROVIDER_SITE_OTHER): Payer: MEDICAID | Admitting: Clinical

## 2022-12-20 DIAGNOSIS — F410 Panic disorder [episodic paroxysmal anxiety] without agoraphobia: Secondary | ICD-10-CM | POA: Diagnosis not present

## 2022-12-20 DIAGNOSIS — F319 Bipolar disorder, unspecified: Secondary | ICD-10-CM

## 2022-12-20 DIAGNOSIS — F4310 Post-traumatic stress disorder, unspecified: Secondary | ICD-10-CM

## 2022-12-20 DIAGNOSIS — F411 Generalized anxiety disorder: Secondary | ICD-10-CM | POA: Diagnosis not present

## 2022-12-20 NOTE — Progress Notes (Signed)
THERAPIST PROGRESS NOTE  Session Time: 11:00-11:29am  Session #10  Virtual Visit via Video Note  I connected with Bethany [name] on 12/20/22 at 11:00 AM EDT by a video enabled telemedicine application and verified that I am speaking with the correct person using two identifiers.  Location: Patient: home Provider: [facility] Outpatient therapy office   I discussed the limitations of evaluation and management by telemedicine and the availability of in person appointments. The patient expressed understanding and agreed to proceed.  I discussed the assessment and treatment plan with the patient. The patient was provided an opportunity to ask questions and all were answered. The patient agreed with the plan and demonstrated an understanding of the instructions.   The patient was advised to call back or seek an in-person evaluation if the symptoms worsen or if the condition fails to improve as anticipated.  I provided 28 minutes of non-face-to-face time during this encounter.  Lynnell Chad, LCSW    Participation Level: Active  Behavioral Response: Casual Alert Euthymic and Hopeful  Type of Therapy: Individual Therapy  Treatment Goals addressed:  LTG: Increase ability to regulate mood as evidenced by fewer highs and fewer lows that impact Bethany [name]'s life  STG: Be free of suicidal thoughts; call crisis hotline if having suicidal thoughts   LTG: Explore and resolve issues related to the various traumatic events in her life  STG: Learn about typical long term/residual effects of traumatic life experiences LTG: Improve ability to see world as others do, as well as ability to accept that they do not see the world as she does.  STG: Be able to use reality testing on her auditory/visual hallucinations and paranoid beliefs   ProgressTowards Goals: Progressing  Interventions: Solution Focused and Supportive  Summary: Bethany [name] is a 47 y.o. female who presents with  Generalized Anxiety Disorder with panic attacks, Bipolar Disorder, and Post-Traumatic Stress Disorder.  She reported that she is having a good day and shared the events of last week where she was found unresponsive in her home.  She ended up having CPR, being transported by ambulance to [facility] and admitted for 3-1/2 days due to severely low potassium.  She will be taking potassium as a supplement now and the hospitalist took her off Hydroxyzine and Tizanidine.   This has resulted in her having a different outlook on life.  We processed this for most of the session.  She stated that she continues to hear the voices but they seem milder because she is "tuning them out."  She has been doing the assignment given to her at last session of repeating the positive affirmations to herself daily while looking in the mirror.  This is an improvement, as previously she would not even look at herself.  She stated that she has added a few new affirmations as well.  She is going for daily walks as assigned by her doctor and finds herself more willing to do these things.  She is to go one house further day by day until she can make a complete circle around the block.  Her granddaughter (first grandchild) was born in [place] and she will soon go visit her.  She also shared the actions she is taking to lose weight including replacing sugar-based sodas with water.  She is trying to be intuitive by eating when she is hungry, resting when she is tired, and such.  She reported that she is "living life one day at a time" and that this  is a new perspective brought on by having almost lost her life.  CSW provided her with encouragement and positive strokes.  We discussed the Serenity Prayer and a copy was sent to her in the mail.  Suicidal/Homicidal: No  Therapist Response:  Patient is progressing AEB engaging in scheduled therapy session.  She presented oriented x5 and stated she was feeling "good."  This is the first time she has been  able to say this since entering into therapy.  CSW evaluated patient's medication compliance and self-care since last session.  Patient stated she has been able to do what was assigned at last session, repeating positive affirmations to herself that are changing the way she thinks.  She had a medical hospitalization following a crisis with her potassium and this seems to have been an epiphany for her about looking at life more positively.    Throughout the session, CSW gave patient the opportunity to explore thoughts and feelings associated with current life situations and past/present external stressors.   CSW encouraged patient's expression of feelings and validated patient's thoughts using empathy, active listening, open body language, and unconditional positive regard.      Recommendations:  Return to therapy in 2 weeks, continue to say positive affirmations to self from positive attributes list sent to her and others she has added, continue to walk daily, consider the serenity prayer sent to her  Plan: Return in 2 weeks  Next appointment:  9/16  Diagnosis: Bipolar disorder with psychotic features (HCC)  Post-traumatic stress disorder, unspecified  Generalized anxiety disorder with panic attacks  Collaboration of Care: Psychiatrist AEB - doctor can read therapy notes  Patient/Guardian was advised Release of Information must be obtained prior to any record release in order to collaborate their care with an outside provider. Patient/Guardian was advised if they have not already done so to contact the registration department to sign all necessary forms in order for [name] to release information regarding their care.   Consent: Patient/Guardian gives verbal consent for treatment and assignment of benefits for services provided during this visit. Patient/Guardian expressed understanding and agreed to proceed.   Lynnell Chad, LCSW 12/20/2022

## 2022-12-24 ENCOUNTER — Encounter: Payer: Self-pay | Admitting: Family Medicine

## 2022-12-24 ENCOUNTER — Ambulatory Visit (INDEPENDENT_AMBULATORY_CARE_PROVIDER_SITE_OTHER): Payer: MEDICAID | Admitting: Family Medicine

## 2022-12-24 VITALS — BP 124/82 | HR 69 | Ht 64.0 in | Wt 154.1 lb

## 2022-12-24 DIAGNOSIS — G723 Periodic paralysis: Secondary | ICD-10-CM

## 2022-12-24 NOTE — Assessment & Plan Note (Signed)
Here for hospital follow-up.  Completed 5 days of potassium supplementation.  Reassuringly neurologic exam within normal is today.  Will recheck potassium today.  Expect within normal limits as patient's diarrhea has now stopped.  If low will consider lower dose supplementation.

## 2022-12-24 NOTE — Patient Instructions (Addendum)
It was great to see you! Thank you for allowing me to participate in your care!  Our plans for today:  - We are checking your potassium today. Stop taking potassium supplementation. If it is low still, I will call to talk about next steps. - Please follow-up with your Psychiatrist next week.   Please arrive 15 minutes PRIOR to your next scheduled appointment time! If you do not, this affects OTHER patients' care.  Take care and seek immediate care sooner if you develop any concerns.   Celine Mans, MD, PGY-2 [facility] 2:10 PM 12/24/2022  [facility]

## 2022-12-24 NOTE — Progress Notes (Signed)
    SUBJECTIVE:   CHIEF COMPLAINT / HPI:   Hospital f/u Hospitalized for hypokalemic periodic paralysis, resolved with repletement Needs bmet for potassium Doing better since leaving hospital Some tingling in toes and numbness No weakness or paralysis  Eating bananas and taking potassium Taking potassium daily No longer having diarrhea, normal stools now  PERTINENT  PMH / PSH: Bipolar, Migraines, Anemia, Hx of Breast Cancer  OBJECTIVE:   BP 124/82   Pulse 69   Ht 5\' 4"  (1.626 m)   Wt 154 lb 2 oz (69.9 kg)   SpO2 100%   BMI 26.46 kg/m   General: NAD Neuro: A&O  CN II: PERRL CN III, IV,VI: EOMI CV V: Normal sensation in V1, V2, V3 CVII: Symmetric smile and brow raise CN VIII: Normal hearing CN IX,X: Symmetric palate raise  CN XI: 5/5 shoulder shrug CN XII: Symmetric tongue protrusion  UE and LE strength 5/5 Normal sensation in UE and LE bilaterally  Cardiovascular: RRR, no murmurs, no peripheral edema Respiratory: normal WOB on RA, CTAB, no wheezes, ronchi or rales Abdomen: soft, NTTP, no rebound or guarding Extremities: Moving all 4 extremities equally   ASSESSMENT/PLAN:   Hypokalemic periodic paralysis Assessment & Plan: Here for hospital follow-up.  Completed 5 days of potassium supplementation.  Reassuringly neurologic exam within normal is today.  Will recheck potassium today.  Expect within normal limits as patient's diarrhea has now stopped.  If low will consider lower dose supplementation.  Orders: -     Basic metabolic panel  Precepted with Dr. Jennette Kettle  Return if symptoms worsen or fail to improve.  Celine Mans, MD [facility]

## 2022-12-25 LAB — BASIC METABOLIC PANEL
BUN/Creatinine Ratio: 4 — ABNORMAL LOW (ref 9–23)
BUN: 4 mg/dL — ABNORMAL LOW (ref 6–24)
CO2: 24 mmol/L (ref 20–29)
Calcium: 9.5 mg/dL (ref 8.7–10.2)
Chloride: 101 mmol/L (ref 96–106)
Creatinine, Ser: 0.92 mg/dL (ref 0.57–1.00)
Glucose: 89 mg/dL (ref 70–99)
Potassium: 4.2 mmol/L (ref 3.5–5.2)
Sodium: 140 mmol/L (ref 134–144)
eGFR: 77 mL/min/{1.73_m2} (ref >59)

## 2022-12-27 ENCOUNTER — Other Ambulatory Visit (HOSPITAL_COMMUNITY): Payer: Self-pay | Admitting: Psychiatry

## 2022-12-27 ENCOUNTER — Other Ambulatory Visit: Payer: Self-pay | Admitting: Surgical

## 2022-12-27 ENCOUNTER — Other Ambulatory Visit: Payer: Self-pay | Admitting: Family Medicine

## 2022-12-27 DIAGNOSIS — G4700 Insomnia, unspecified: Secondary | ICD-10-CM

## 2022-12-27 DIAGNOSIS — F3160 Bipolar disorder, current episode mixed, unspecified: Secondary | ICD-10-CM

## 2022-12-31 ENCOUNTER — Telehealth: Payer: Self-pay | Admitting: *Deleted

## 2022-12-31 ENCOUNTER — Encounter (HOSPITAL_COMMUNITY): Payer: Self-pay | Admitting: Psychiatry

## 2022-12-31 ENCOUNTER — Other Ambulatory Visit: Payer: Self-pay | Admitting: Family Medicine

## 2022-12-31 ENCOUNTER — Telehealth (HOSPITAL_BASED_OUTPATIENT_CLINIC_OR_DEPARTMENT_OTHER): Payer: MEDICAID | Admitting: Psychiatry

## 2022-12-31 VITALS — Wt 154.0 lb

## 2022-12-31 DIAGNOSIS — F3160 Bipolar disorder, current episode mixed, unspecified: Secondary | ICD-10-CM

## 2022-12-31 DIAGNOSIS — G4700 Insomnia, unspecified: Secondary | ICD-10-CM | POA: Diagnosis not present

## 2022-12-31 DIAGNOSIS — F419 Anxiety disorder, unspecified: Secondary | ICD-10-CM

## 2022-12-31 MED ORDER — LITHIUM CARBONATE ER 300 MG PO TBCR: 300.0000 mg | EXTENDED_RELEASE_TABLET | Freq: Three times a day (TID) | ORAL | 1 refills | Status: DC

## 2022-12-31 MED ORDER — GABAPENTIN 100 MG PO CAPS
100.0000 mg | ORAL_CAPSULE | Freq: Three times a day (TID) | ORAL | 1 refills | Status: DC
Start: 2022-12-31 — End: 2023-01-27

## 2022-12-31 MED ORDER — QUETIAPINE FUMARATE 200 MG PO TABS
200.0000 mg | ORAL_TABLET | Freq: Every day | ORAL | 1 refills | Status: DC
Start: 2022-12-31 — End: 2023-02-24

## 2022-12-31 NOTE — Telephone Encounter (Signed)
Received on (12/30/22) via of fax DME Standard Written Order from [facility] [place] requesting signature and date.  Given to provider to sign.   DME Standard Written Order signed,dated, and faxed back to [facility] [place].  Confirmation received and copy scanned into the chart.//AB/CMA

## 2022-12-31 NOTE — Telephone Encounter (Signed)
Received on (12/30/22) via of fax DME Standard Written Order from [facility] [place] requesting review,sign,and return.  Given to provider to complete.    DME Standard Written Order reviewed,signed,and faxed back to [facility] [place].  Confirmation received and copy scanned into the chart./AB/CMA

## 2022-12-31 NOTE — Progress Notes (Signed)
[facility] MD Virtual Progress Note   Patient Location: Home Provider Location: Home Office  I connect with patient by video and verified that I am speaking with correct person by using two identifiers. I discussed the limitations of evaluation and management by telemedicine and the availability of in person appointments. I also discussed with the patient that there may be a patient responsible charge related to this service. The patient expressed understanding and agreed to proceed.  Bethany [name] [id number] 47 y.o.  12/31/2022 10:44 AM  History of Present Illness:  Patient is evaluated by video session.  She is slowly recovering after hospitalized for change mental status, hypokalemia and unresponsive.  Patient was found by his mother after she was found unresponsive and she was admitted to the hospital from August 25 to August 27.  Electrolytes were replaced.  She is doing better.  She is not sure what triggered but remember having COVID and not eating well and started to have diarrhea.  She is slowly and gradually getting better.  She is still having irritability, sometimes mood swings and paranoia but she is trying to focus on her prayers that has been helpful and these voices and paranoia has been less intense.  She lives with her parents.  Her balance is better since she has a radioablation.  She does not need a walker but also not very comfortable leaving the house.  She is not driving.  She denies any suicidal thoughts or homicidal thoughts.  She denies any feeling of hopelessness or worthlessness.  She is pleased that her 90 year old daughter [place] and 23 year old essence came to visit her in the hospital.  She is taking Seroquel, trazodone and lithium.  She liked the lithium but her last lithium level was therapeutic when she was in the hospital.  It is unclear if because of diarrhea but she was not taking on a regular basis.  She denies any tremors.  She denies any major  panic attack.  Past Psychiatric History: Saw therapist in [facility] after brother killed.  Took Zoloft and lorazepam by PCP.  She took Lamictal but stopped after the rash.  Trazodone helped but wanted to try something for anxiety and it was switched to hydroxyzine.  No h/o inpatient treatment, suicidal attempt, psychosis or any mania.  H/O irritability, anger and mood swing.  Tried Lamictal but stopped due to rash.      Outpatient Encounter Medications as of 12/31/2022  Medication Sig   acetaminophen (TYLENOL) 650 MG CR tablet Take 1,300 mg by mouth every 8 (eight) hours as needed for pain (or headaches).   diclofenac Sodium (VOLTAREN) 1 % GEL APPLY 4 G TOPICALLY 4 TIMES A DAY (Patient taking differently: Apply 4 g topically 4 (four) times daily as needed (for pain- affected area).)   famotidine (PEPCID) 20 MG tablet TAKE 1 TABLET BY MOUTH TWICE A DAY   gabapentin (NEURONTIN) 100 MG capsule Take 1 capsule (100 mg total) by mouth 3 (three) times daily.   hydrOXYzine (ATARAX) 50 MG tablet TAKE 1 TABLET BY MOUTH AT BEDTIME AS NEEDED. MAY TAKE UP TO 3 TIMES DAILY. (Patient not taking: Reported on 12/18/2022)   lidocaine 4 % Place 1 patch onto the skin daily as needed (for pain- affected area).   linaclotide (LINZESS) 145 MCG CAPS capsule TAKE 1 CAPSULE BY MOUTH EVERY DAY BEFORE BREAKFAST - INSURANCE WILL PAY ON 6/25 (Patient taking differently: Take 145 mcg by mouth daily before breakfast.)   lithium carbonate (LITHOBID)  300 MG ER tablet Take 1 tablet (300 mg total) by mouth 3 (three) times daily.   ondansetron (ZOFRAN) 4 MG tablet Take 1 tablet (4 mg total) by mouth every 8 (eight) hours as needed for nausea or vomiting.   pantoprazole (PROTONIX) 40 MG tablet Take 1 tablet (40 mg total) by mouth daily. (Patient taking differently: Take 40 mg by mouth daily before breakfast.)   potassium chloride SA (KLOR-CON M) 20 MEQ tablet Take 2 tablets (40 mEq total) by mouth daily for 5 days.   promethazine  (PHENERGAN) 12.5 MG tablet Take 1 tablet (12.5 mg total) by mouth every 6 (six) hours as needed for nausea or vomiting.   QUEtiapine Fumarate 150 MG TABS Take 150 mg by mouth at bedtime.   traZODone (DESYREL) 100 MG tablet Take 1 tablet (100 mg total) by mouth at bedtime.   No facility-administered encounter medications on file as of 12/31/2022.    Recent Results (from the past 2160 hour(s))  Pregnancy, urine POC     Status: None   Collection Time: 10/31/22  7:34 AM  Result Value Ref Range   Preg Test, Ur NEGATIVE NEGATIVE    Comment:        THE SENSITIVITY OF THIS METHODOLOGY IS >24 mIU/mL   Surgical pathology     Status: None   Collection Time: 10/31/22 10:55 AM  Result Value Ref Range   SURGICAL PATHOLOGY      SURGICAL PATHOLOGY CASE: [id number] PATIENT: Jeaneen Gongora Surgical Pathology Report     Clinical History: Breast pain (nt)     FINAL MICROSCOPIC DIAGNOSIS:  A. BREAST, RIGHT, CAPSULE: - Benign fibrotic tissue consistent with capsule - No malignancy identified  B. BREAST, LEFT, CAPSULE: - Benign fibrotic tissue consistent with capsule - No malignancy identified  GROSS DESCRIPTION:  A: The specimen is received in formalin and consists of a 7.5 x 5.5 x 2.0 cm aggregate of tan-pink fibromembranous tissue.  1 surface displays a small amount of attached tan-yellow adipose tissue, the opposing surface is tan-pink and slightly wrinkled.  Representative sections are submitted in 1 cassette.  B: Specimen is received in formalin and consists of a 7.0 x 4.5 x 2.0 cm aggregate of tan-pink fibromembranous tissue.  1 surface displays a small amount of attached tan-yellow adipose tissue, the opposing surface is tan-pink and slightly wrinkled.  Representative sectio ns are submitted in 1 cassette.  Lovey Newcomer 10/31/2022)    Final Diagnosis performed by Clifton James, MD.   Electronically signed 11/01/2022 Technical component performed at [employer],  [address] [place], [place] [address].  Professional component performed at [facility], [address] [place], [place] [address].  Immunohistochemistry Technical component (if applicable) was performed at [facility]. [address], [place], [place] [address].   IMMUNOHISTOCHEMISTRY DISCLAIMER (if applicable): Some of these immunohistochemical stains may have been developed and the performance characteristics determine by [facility]. Some may not have been cleared or approved by the U.S. Food and Drug Administration. The FDA has determined that such clearance or approval is not necessary. This test is used for clinical purposes. It should not be regarded as investigational or for research. This laboratory  is certified under the Clinical Laboratory Improvement Amendments of 1988 (CLIA-88) as qualified to perform high complexity clinical laboratory testing.  The controls stained appropriately.   IHC stains are performed on formalin fixed, paraffin embedded tissue using a 3,3"diaminobenzidine (DAB) chromogen and Leica Bond Autostainer  System. The staining intensity of the nucleus is score manually and is reported as the percentage of tumor cell nuclei demonstrating specific nuclear staining. The specimens are fixed in 10% Neutral Formalin for at least 6 hours and up to 72hrs. These tests are validated on decalcified tissue. Results should be interpreted with caution given the possibility of false negative results on decalcified specimens. Antibody Clones are as follows ER-clone 47F, PR-clone 16, Ki67- clone MM1. Some of these immunohistochemical stains may have been developed and the performance characteristics determined by [facility].   CBG monitoring, ED     Status: Abnormal   Collection Time: 12/15/22  3:26 PM  Result Value Ref Range   Glucose-Capillary 126 (H) 70 - 99 mg/dL    Comment: Glucose reference  range applies only to samples taken after fasting for at least 8 hours.  Ethanol     Status: None   Collection Time: 12/15/22  3:31 PM  Result Value Ref Range   Alcohol, Ethyl (B) <10 <10 mg/dL    Comment: (NOTE) Lowest detectable limit for serum alcohol is 10 mg/dL.  For medical purposes only. Performed at [facility], [address], [place], [place] [address]   Protime-INR     Status: None   Collection Time: 12/15/22  3:31 PM  Result Value Ref Range   Prothrombin Time 13.4 11.4 - 15.2 seconds   INR 1.0 0.8 - 1.2    Comment: (NOTE) INR goal varies based on device and disease states. Performed at [facility], [address] [place], [place] [address]   APTT     Status: None   Collection Time: 12/15/22  3:31 PM  Result Value Ref Range   aPTT 33 24 - 36 seconds    Comment: Performed at [facility], [address] [place], [place] [address]  CBC     Status: None   Collection Time: 12/15/22  3:31 PM  Result Value Ref Range   WBC 6.1 4.0 - 10.5 K/uL   RBC 4.50 3.87 - 5.11 MIL/uL   Hemoglobin 12.9 12.0 - 15.0 g/dL   HCT 28.4 13.2 - 44.0 %   MCV 87.1 80.0 - 100.0 fL   MCH 28.7 26.0 - 34.0 pg   MCHC 32.9 30.0 - 36.0 g/dL   RDW 10.2 72.5 - 36.6 %   Platelets 251 150 - 400 K/uL   nRBC 0.0 0.0 - 0.2 %    Comment: Performed at [facility], [address] [place], [place] [address]  Differential     Status: None   Collection Time: 12/15/22  3:31 PM  Result Value Ref Range   Neutrophils Relative % 68 %   Neutro Abs 4.1 1.7 - 7.7 K/uL   Lymphocytes Relative 23 %   Lymphs Abs 1.4 0.7 - 4.0 K/uL   Monocytes Relative 9 %   Monocytes Absolute 0.6 0.1 - 1.0 K/uL   Eosinophils Relative 0 %   Eosinophils Absolute 0.0 0.0 - 0.5 K/uL   Basophils Relative 0 %   Basophils Absolute 0.0 0.0 - 0.1 K/uL   Immature Granulocytes 0 %   Abs Immature Granulocytes 0.01 0.00 - 0.07 K/uL    Comment: Performed at  [facility], [address] [place], [place] [address]  Comprehensive metabolic panel     Status: Abnormal   Collection Time: 12/15/22  3:31 PM  Result Value Ref Range   Sodium 136 135 - 145 mmol/L  Potassium 2.4 (LL) 3.5 - 5.1 mmol/L    Comment: CRITICAL RESULT CALLED TO, READ BACK BY AND VERIFIED WITH LIVINGSTON,K. EMTP AT 1605 12/15/22 MULLINS,T    Chloride 104 98 - 111 mmol/L   CO2 20 (L) 22 - 32 mmol/L   Glucose, Bld 126 (H) 70 - 99 mg/dL    Comment: Glucose reference range applies only to samples taken after fasting for at least 8 hours.   BUN 5 (L) 6 - 20 mg/dL   Creatinine, Ser 1.61 0.44 - 1.00 mg/dL   Calcium 8.7 (L) 8.9 - 10.3 mg/dL   Total Protein 7.8 6.5 - 8.1 g/dL   Albumin 4.4 3.5 - 5.0 g/dL   AST 24 15 - 41 U/L   ALT 20 0 - 44 U/L   Alkaline Phosphatase 54 38 - 126 U/L   Total Bilirubin 0.5 0.3 - 1.2 mg/dL   GFR, Estimated >09 >60 mL/min    Comment: (NOTE) Calculated using the CKD-EPI Creatinine Equation (2021)    Anion gap 12 5 - 15    Comment: Performed at [facility], [address] [place], [place] [address]  hCG, serum, qualitative     Status: None   Collection Time: 12/15/22  3:31 PM  Result Value Ref Range   Preg, Serum NEGATIVE NEGATIVE    Comment:        THE SENSITIVITY OF THIS METHODOLOGY IS >10 mIU/mL. Performed at [facility], [address], [place], [place] [address]   T4, free     Status: None   Collection Time: 12/15/22  5:33 PM  Result Value Ref Range   Free T4 0.77 0.61 - 1.12 ng/dL    Comment: HEMOLYSIS AT THIS LEVEL MAY AFFECT RESULT (NOTE) Biotin ingestion may interfere with free T4 tests. If the results are inconsistent with the TSH level, previous test results, or the clinical presentation, then consider biotin interference. If needed, order repeat testing after stopping biotin. Performed at [facility], [address] [place], [place] [address]   Phosphorus      Status: Abnormal   Collection Time: 12/15/22  5:36 PM  Result Value Ref Range   Phosphorus 2.4 (L) 2.5 - 4.6 mg/dL    Comment: HEMOLYSIS AT THIS LEVEL MAY AFFECT RESULT Performed at [facility], [address], [place], [place] [address]   CK     Status: None   Collection Time: 12/15/22  5:36 PM  Result Value Ref Range   Total CK 191 38 - 234 U/L    Comment: HEMOLYSIS AT THIS LEVEL MAY AFFECT RESULT Performed at [facility], [address], [place], [place] [address]   Magnesium     Status: None   Collection Time: 12/15/22  5:36 PM  Result Value Ref Range   Magnesium 2.0 1.7 - 2.4 mg/dL    Comment: Performed at [facility], [address], [place], [place] [address]  TSH     Status: Abnormal   Collection Time: 12/15/22  5:36 PM  Result Value Ref Range   TSH 0.291 (L) 0.350 - 4.500 uIU/mL    Comment: Performed by a 3rd Generation assay with a functional sensitivity of <=0.01 uIU/mL. Performed at [facility], [address] [place], [place] [address]   T3     Status: None   Collection Time: 12/15/22  5:36 PM  Result Value Ref Range   T3, Total 139 71 - 180 ng/dL  Comment: (NOTE) Performed At: [facility] [address] [place], [place] [id number] [name] Schimke MD ZH:[id number]   Urine rapid drug screen (hosp performed)not at [facility]     Status: None   Collection Time: 12/15/22  6:06 PM  Result Value Ref Range   Opiates NONE DETECTED NONE DETECTED   Cocaine NONE DETECTED NONE DETECTED   Benzodiazepines NONE DETECTED NONE DETECTED   Amphetamines NONE DETECTED NONE DETECTED   Tetrahydrocannabinol NONE DETECTED NONE DETECTED   Barbiturates NONE DETECTED NONE DETECTED    Comment: (NOTE) DRUG SCREEN FOR MEDICAL PURPOSES ONLY.  IF CONFIRMATION IS NEEDED FOR ANY PURPOSE, NOTIFY LAB WITHIN 5 DAYS.  LOWEST DETECTABLE LIMITS FOR URINE DRUG SCREEN Drug Class                     Cutoff  (ng/mL) Amphetamine and metabolites    1000 Barbiturate and metabolites    200 Benzodiazepine                 200 Opiates and metabolites        300 Cocaine and metabolites        300 THC                            50 Performed at [facility], [address], [place], [place] [address]   Urinalysis, Routine w reflex microscopic -Urine, Clean Catch     Status: Abnormal   Collection Time: 12/15/22  6:06 PM  Result Value Ref Range   Color, Urine STRAW (A) YELLOW   APPearance CLEAR CLEAR   Specific Gravity, Urine 1.036 (H) 1.005 - 1.030   pH 7.0 5.0 - 8.0   Glucose, UA NEGATIVE NEGATIVE mg/dL   Hgb urine dipstick SMALL (A) NEGATIVE   Bilirubin Urine NEGATIVE NEGATIVE   Ketones, ur 20 (A) NEGATIVE mg/dL   Protein, ur NEGATIVE NEGATIVE mg/dL   Nitrite NEGATIVE NEGATIVE   Leukocytes,Ua NEGATIVE NEGATIVE   RBC / HPF 0-5 0 - 5 RBC/hpf   WBC, UA 0-5 0 - 5 WBC/hpf   Bacteria, UA RARE (A) NONE SEEN   Squamous Epithelial / HPF 0-5 0 - 5 /HPF    Comment: Performed at [facility], [address] [place], [place] [address]  CBC     Status: None   Collection Time: 12/15/22  7:59 PM  Result Value Ref Range   WBC 5.3 4.0 - 10.5 K/uL   RBC 4.23 3.87 - 5.11 MIL/uL   Hemoglobin 12.1 12.0 - 15.0 g/dL   HCT 40.1 02.7 - 25.3 %   MCV 89.4 80.0 - 100.0 fL   MCH 28.6 26.0 - 34.0 pg   MCHC 32.0 30.0 - 36.0 g/dL   RDW 66.4 40.3 - 47.4 %   Platelets 243 150 - 400 K/uL   nRBC 0.0 0.0 - 0.2 %    Comment: Performed at [facility], [address] [place], [place] [address]  Creatinine, serum     Status: None   Collection Time: 12/15/22  7:59 PM  Result Value Ref Range   Creatinine, Ser 0.66 0.44 - 1.00 mg/dL   GFR, Estimated >38 >75 mL/min    Comment: (NOTE) Calculated using the CKD-EPI Creatinine Equation (2021) Performed at [facility], [address], [place], [place] [address]   Potassium     Status: Abnormal    Collection Time: 12/15/22  7:59 PM  Result Value Ref Range   Potassium 3.1 (L) 3.5 - 5.1 mmol/L    Comment: Performed at [facility], [address] [place], [place] [address]  Potassium     Status: Abnormal   Collection Time: 12/15/22 11:24 PM  Result Value Ref Range   Potassium 3.2 (L) 3.5 - 5.1 mmol/L    Comment: Performed at [facility], [address], [place], [place] [address]  Comprehensive metabolic panel     Status: Abnormal   Collection Time: 12/15/22 11:25 PM  Result Value Ref Range   Sodium 134 (L) 135 - 145 mmol/L   Potassium 3.2 (L) 3.5 - 5.1 mmol/L   Chloride 106 98 - 111 mmol/L   CO2 20 (L) 22 - 32 mmol/L   Glucose, Bld 91 70 - 99 mg/dL    Comment: Glucose reference range applies only to samples taken after fasting for at least 8 hours.   BUN <5 (L) 6 - 20 mg/dL   Creatinine, Ser 0.98 0.44 - 1.00 mg/dL   Calcium 8.1 (L) 8.9 - 10.3 mg/dL   Total Protein 7.1 6.5 - 8.1 g/dL   Albumin 4.1 3.5 - 5.0 g/dL   AST 18 15 - 41 U/L   ALT 18 0 - 44 U/L   Alkaline Phosphatase 49 38 - 126 U/L   Total Bilirubin 0.6 0.3 - 1.2 mg/dL   GFR, Estimated >11 >91 mL/min    Comment: (NOTE) Calculated using the CKD-EPI Creatinine Equation (2021)    Anion gap 8 5 - 15    Comment: Performed at [facility], [address] [place], [place] [address]  CBC     Status: None   Collection Time: 12/15/22 11:25 PM  Result Value Ref Range   WBC 5.5 4.0 - 10.5 K/uL   RBC 4.25 3.87 - 5.11 MIL/uL   Hemoglobin 12.1 12.0 - 15.0 g/dL   HCT 56.2 13.0 - 86.5 %   MCV 88.5 80.0 - 100.0 fL   MCH 28.5 26.0 - 34.0 pg   MCHC 32.2 30.0 - 36.0 g/dL   RDW 78.4 69.6 - 29.5 %   Platelets 233 150 - 400 K/uL   nRBC 0.0 0.0 - 0.2 %    Comment: Performed at [facility], [address], [place], [place] [address]  Potassium     Status: Abnormal   Collection Time: 12/16/22  4:34 AM  Result Value Ref Range   Potassium 3.0 (L) 3.5 -  5.1 mmol/L    Comment: Performed at [facility], [address] [place], [place] [address]  Basic metabolic panel     Status: Abnormal   Collection Time: 12/16/22 12:43 PM  Result Value Ref Range   Sodium 138 135 - 145 mmol/L   Potassium 4.0 3.5 - 5.1 mmol/L    Comment: DELTA CHECK NOTED   Chloride 106 98 - 111 mmol/L   CO2 17 (L) 22 - 32 mmol/L   Glucose, Bld 92 70 - 99 mg/dL    Comment: Glucose reference range applies only to samples taken after fasting for at least 8 hours.   BUN <5 (L) 6 - 20 mg/dL   Creatinine, Ser 0.27 0.44 - 1.00 mg/dL   Calcium 8.5 (L) 8.9 - 10.3 mg/dL   GFR, Estimated >25 >36 mL/min    Comment: (NOTE) Calculated using the CKD-EPI Creatinine Equation (2021)    Anion gap 15 5 - 15    Comment: Performed at  [facility], [address] [place], [place] [address]  Lithium level     Status: Abnormal   Collection Time: 12/16/22 12:43 PM  Result Value Ref Range   Lithium Lvl <0.06 (L) 0.60 - 1.20 mmol/L    Comment: Performed at [facility], [address] [place], [place] [address]  Phosphorus     Status: Abnormal   Collection Time: 12/16/22 12:43 PM  Result Value Ref Range   Phosphorus 1.7 (L) 2.5 - 4.6 mg/dL    Comment: Performed at [facility], [address], [place], [place] [address]  Basic metabolic panel     Status: Abnormal   Collection Time: 12/17/22  4:04 AM  Result Value Ref Range   Sodium 137 135 - 145 mmol/L   Potassium 3.0 (L) 3.5 - 5.1 mmol/L   Chloride 106 98 - 111 mmol/L   CO2 24 22 - 32 mmol/L   Glucose, Bld 119 (H) 70 - 99 mg/dL    Comment: Glucose reference range applies only to samples taken after fasting for at least 8 hours.   BUN 6 6 - 20 mg/dL   Creatinine, Ser 9.14 0.44 - 1.00 mg/dL   Calcium 8.4 (L) 8.9 - 10.3 mg/dL   GFR, Estimated >78 >29 mL/min    Comment: (NOTE) Calculated using the CKD-EPI Creatinine Equation (2021)    Anion gap 7 5 - 15     Comment: Performed at [facility], [address] [place], [place] [address]  Phosphorus     Status: None   Collection Time: 12/17/22  4:04 AM  Result Value Ref Range   Phosphorus 4.6 2.5 - 4.6 mg/dL    Comment: Performed at [facility], [address], [place], [place] [address]  Magnesium     Status: None   Collection Time: 12/17/22  4:04 AM  Result Value Ref Range   Magnesium 1.9 1.7 - 2.4 mg/dL    Comment: Performed at [facility], [address] [place], [place] [address]  Basic metabolic panel     Status: Abnormal   Collection Time: 12/17/22 12:59 PM  Result Value Ref Range   Sodium 137 135 - 145 mmol/L   Potassium 4.2 3.5 - 5.1 mmol/L   Chloride 106 98 - 111 mmol/L   CO2 21 (L) 22 - 32 mmol/L   Glucose, Bld 142 (H) 70 - 99 mg/dL    Comment: Glucose reference range applies only to samples taken after fasting for at least 8 hours.   BUN <5 (L) 6 - 20 mg/dL   Creatinine, Ser 2.95 0.44 - 1.00 mg/dL   Calcium 9.0 8.9 - 28.4 mg/dL   GFR, Estimated >13 >24 mL/min    Comment: (NOTE) Calculated using the CKD-EPI Creatinine Equation (2021)    Anion gap 10 5 - 15    Comment: Performed at [facility], [address], [place], [place] [address]  Basic Metabolic Panel     Status: Abnormal   Collection Time: 12/24/22  5:48 PM  Result Value Ref Range   Glucose 89 70 - 99 mg/dL   BUN 4 (L) 6 - 24 mg/dL   Creatinine, Ser 7.25 0.57 - 1.00 mg/dL   eGFR 77 >36 UY/QIH/4.74   BUN/Creatinine Ratio 4 (L) 9 - 23   Sodium 140 134 - 144 mmol/L   Potassium 4.2 3.5 - 5.2 mmol/L   Chloride 101 96 - 106 mmol/L   CO2  24 20 - 29 mmol/L   Calcium 9.5 8.7 - 10.2 mg/dL     Psychiatric Specialty Exam: Physical Exam  Review of Systems  Weight 154 lb (69.9 kg).There is no height or weight on file to calculate BMI.  General Appearance: Casual  Eye Contact:  Fair  Speech:  Slow  Volume:  Decreased  Mood:   Dysphoric  Affect:  Congruent  Thought Process:  Descriptions of Associations: Intact  Orientation:  Full (Time, Place, and Person)  Thought Content:  Hallucinations: chronic hallucination, Paranoid Ideation, and Rumination  Suicidal Thoughts:  No  Homicidal Thoughts:  No  Memory:  Immediate;   Fair Recent;   Fair Remote;   Fair  Judgement:  Fair  Insight:  Shallow  Psychomotor Activity:  Decreased  Concentration:  Concentration: Fair and Attention Span: Fair  Recall:  [employer] of Knowledge:  Fair  Language:  Fair  Akathisia:  No  Handed:  Right  AIMS (if indicated):     Assets:  Communication Skills Desire for Improvement Housing  ADL's:  Intact  Cognition:  WNL  Sleep:  fair     Assessment/Plan: Mixed bipolar I disorder (HCC) - Plan: gabapentin (NEURONTIN) 100 MG capsule, lithium carbonate (LITHOBID) 300 MG ER tablet, QUEtiapine (SEROQUEL) 200 MG tablet  Anxiety - Plan: gabapentin (NEURONTIN) 100 MG capsule  Insomnia, unspecified type - Plan: QUEtiapine (SEROQUEL) 200 MG tablet  I reviewed blood work results.  Her lithium level is subtherapeutic.  Her BUN and creatinine is normal.  Her potassium is now 4.2.  We talk about polypharmacy.  She is taking trazodone 100 mg at bedtime and Seroquel 150 mg at bedtime.  I recommend discontinuing trazodone and optimize the Seroquel 200 to help the residual paranoia and voices and avoid polypharmacy.  Encouraged to continue therapy with Ms. Lucretia Field.  She also using her religion to help her these voices.  She started praying 3 times a day.  She also need a letter as she cannot work so she can continue to get food stamps.  Continue gabapentin 100 mg 3 times a day.  We will do lithium level in our office.  Follow-up in 2 months.  Recommend to call us back if is any question or any concern.   Follow Up Instructions:     I discussed the assessment and treatment plan with the patient. The patient was provided an opportunity to ask  questions and all were answered. The patient agreed with the plan and demonstrated an understanding of the instructions.   The patient was advised to call back or seek an in-person evaluation if the symptoms worsen or if the condition fails to improve as anticipated.    Collaboration of Care: Other provider involved in patient's care AEB notes are available in epic to review.  Patient/Guardian was advised Release of Information must be obtained prior to any record release in order to collaborate their care with an outside provider. Patient/Guardian was advised if they have not already done so to contact the registration department to sign all necessary forms in order for [name] to release information regarding their care.   Consent: Patient/Guardian gives verbal consent for treatment and assignment of benefits for services provided during this visit. Patient/Guardian expressed understanding and agreed to proceed.     I provided 30 minutes of non face to face time during this encounter.  Note: This document was prepared by [employer] voice dictation technology and any errors that results from this process are unintentional.  Cleotis Nipper, MD 12/31/2022

## 2023-01-06 ENCOUNTER — Ambulatory Visit (INDEPENDENT_AMBULATORY_CARE_PROVIDER_SITE_OTHER): Payer: MEDICAID | Admitting: Clinical

## 2023-01-06 ENCOUNTER — Other Ambulatory Visit (HOSPITAL_COMMUNITY): Payer: Self-pay

## 2023-01-06 ENCOUNTER — Encounter (HOSPITAL_COMMUNITY): Payer: Self-pay | Admitting: Clinical

## 2023-01-06 DIAGNOSIS — F319 Bipolar disorder, unspecified: Secondary | ICD-10-CM | POA: Diagnosis not present

## 2023-01-06 DIAGNOSIS — F410 Panic disorder [episodic paroxysmal anxiety] without agoraphobia: Secondary | ICD-10-CM | POA: Diagnosis not present

## 2023-01-06 DIAGNOSIS — F411 Generalized anxiety disorder: Secondary | ICD-10-CM | POA: Diagnosis not present

## 2023-01-06 DIAGNOSIS — F4310 Post-traumatic stress disorder, unspecified: Secondary | ICD-10-CM

## 2023-01-06 DIAGNOSIS — Z5181 Encounter for therapeutic drug level monitoring: Secondary | ICD-10-CM
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
## 2023-01-07 ENCOUNTER — Ambulatory Visit (HOSPITAL_BASED_OUTPATIENT_CLINIC_OR_DEPARTMENT_OTHER): Payer: MEDICAID

## 2023-01-07 DIAGNOSIS — Z5181 Encounter for therapeutic drug level monitoring: Secondary | ICD-10-CM

## 2023-01-07 NOTE — Progress Notes (Signed)
Patient arrived in office today for her due labs. Dr. Lolly Mustache ordered a Lithium level. Patient had no questions for me and tolerated the procedure well and without complaint. We tried the left AC first and I was unable to hit the vein, we switched to the left hand using a 25 g butterfly. Blood collection was successful and patient said she would like a call back with the results.

## 2023-01-08 ENCOUNTER — Other Ambulatory Visit (HOSPITAL_COMMUNITY): Payer: Self-pay | Admitting: Psychiatry

## 2023-01-09 LAB — LITHIUM LEVEL: Lithium Lvl: 0.6 mmol/L (ref 0.5–1.2)

## 2023-01-23 ENCOUNTER — Other Ambulatory Visit (HOSPITAL_COMMUNITY): Payer: Self-pay | Admitting: Psychiatry

## 2023-01-23 DIAGNOSIS — F3160 Bipolar disorder, current episode mixed, unspecified: Secondary | ICD-10-CM

## 2023-01-23 DIAGNOSIS — G4700 Insomnia, unspecified: Secondary | ICD-10-CM

## 2023-01-27 ENCOUNTER — Encounter: Payer: Self-pay | Admitting: Family Medicine

## 2023-01-27 ENCOUNTER — Ambulatory Visit (INDEPENDENT_AMBULATORY_CARE_PROVIDER_SITE_OTHER): Payer: MEDICAID | Admitting: Family Medicine

## 2023-01-27 ENCOUNTER — Telehealth (HOSPITAL_COMMUNITY): Payer: Self-pay | Admitting: *Deleted

## 2023-01-27 ENCOUNTER — Ambulatory Visit
Admission: RE | Admit: 2023-01-27 | Discharge: 2023-01-27 | Disposition: A | Discharge status: Home / Self Care | Payer: MEDICAID | Source: Ambulatory Visit | Attending: Family Medicine | Admitting: Family Medicine

## 2023-01-27 VITALS — BP 118/84 | Ht 64.0 in | Wt 153.0 lb

## 2023-01-27 DIAGNOSIS — M5412 Radiculopathy, cervical region: Secondary | ICD-10-CM | POA: Diagnosis not present

## 2023-01-27 DIAGNOSIS — M4722 Other spondylosis with radiculopathy, cervical region: Secondary | ICD-10-CM | POA: Insufficient documentation

## 2023-01-27 DIAGNOSIS — M7542 Impingement syndrome of left shoulder: Secondary | ICD-10-CM

## 2023-01-27 MED ORDER — METHYLPREDNISOLONE 4 MG PO TBPK: ORAL_TABLET | ORAL | 0 refills | Status: DC

## 2023-01-27 NOTE — Assessment & Plan Note (Addendum)
Acute left-sided neck and arm pain consistent with cervical radiculopathy.  She has had history of frozen shoulder and impingement syndrome in the past, but today's presentation is not consistent with that and is most consistent with cervical radiculopathy.  Plan: 1.  Diagnosis and treatment discussed in detail 2.  Rx Medrol Dosepak to take as directed.  Splane how to take medication and common side effects. 3.  Imaging: C-spine x-rays AP/lateral to rule out degenerative changes or other abnormalities.  I will reach out with results when available 4.  Recommend return to PT, referral was given 5.  Continue with heat or ice as needed. 6.  Follow-up 6 weeks to reevaluate, consider MRI if not improving.  Patient expressed understanding agreement with above.  All questions were answered

## 2023-01-27 NOTE — Progress Notes (Signed)
DATE OF VISIT: 01/27/2023        Bethany [name] DOB: 05-Aug-1975 MRN: [id number]  CC:  f/u Lt shoulder  History of present Illness: Bethany [name] is a 47 y.o. female who presents for a follow-up visit of left shoulder.  She is RHD.  Hx Frozen Shoulder and impingement.  Pain started 05/2022 - no injury/trauma.  S/p u/s guided glenohumeral inj 07/10/22, subacromial inj 09/02/22.  Last seen by Dr Pearletha Forge 10/07/22.  Today she reports started to flare again recently.  Woke with pain again about 3- weeks ago.  No new injury/trauma.  Feeling pain around the shoulder.   Occ numbness/tingling into the left hand (all fingers) Feels weaker in the left hand and arm  (+)neck pain.   Worse with sleeping and overhead activities Worse with getting dressed and with driving Worse with bathing Using Voltaren gel prn - temporary relief for 30-45 mins Using heat and ice for 15-mins Last PT ~May 2024  Medications:  Outpatient Encounter Medications as of 01/27/2023  Medication Sig   methylPREDNISolone (MEDROL DOSEPAK) 4 MG TBPK tablet Dispense 1 box/package -- take as directed on package   acetaminophen (TYLENOL) 650 MG CR tablet Take 1,300 mg by mouth every 8 (eight) hours as needed for pain (or headaches).   diclofenac Sodium (VOLTAREN) 1 % GEL APPLY 4 G TOPICALLY 4 TIMES A DAY (Patient taking differently: Apply 4 g topically 4 (four) times daily as needed (for pain- affected area).)   famotidine (PEPCID) 20 MG tablet TAKE 1 TABLET BY MOUTH TWICE A DAY   gabapentin (NEURONTIN) 100 MG capsule Take 1 capsule (100 mg total) by mouth 3 (three) times daily.   hydrOXYzine (ATARAX) 50 MG tablet TAKE 1 TABLET BY MOUTH AT BEDTIME AS NEEDED. MAY TAKE UP TO 3 TIMES DAILY. (Patient not taking: Reported on 12/18/2022)   lidocaine 4 % Place 1 patch onto the skin daily as needed (for pain- affected area).   linaclotide (LINZESS) 145 MCG CAPS capsule TAKE 1 CAPSULE BY MOUTH EVERY DAY BEFORE BREAKFAST - INSURANCE WILL PAY ON 6/25  (Patient taking differently: Take 145 mcg by mouth daily before breakfast.)   lithium carbonate (LITHOBID) 300 MG ER tablet Take 1 tablet (300 mg total) by mouth 3 (three) times daily.   ondansetron (ZOFRAN) 4 MG tablet Take 1 tablet (4 mg total) by mouth every 8 (eight) hours as needed for nausea or vomiting.   pantoprazole (PROTONIX) 40 MG tablet TAKE 1 TABLET BY MOUTH EVERY DAY   potassium chloride SA (KLOR-CON M) 20 MEQ tablet Take 2 tablets (40 mEq total) by mouth daily for 5 days.   promethazine (PHENERGAN) 12.5 MG tablet Take 1 tablet (12.5 mg total) by mouth every 6 (six) hours as needed for nausea or vomiting.   QUEtiapine (SEROQUEL) 200 MG tablet Take 1 tablet (200 mg total) by mouth at bedtime.   QUEtiapine Fumarate 150 MG TABS Take 150 mg by mouth at bedtime.   traZODone (DESYREL) 100 MG tablet Take 1 tablet (100 mg total) by mouth at bedtime.   No facility-administered encounter medications on file as of 01/27/2023.    Allergies: is allergic to penicillins, tapentadol, lamictal [lamotrigine], codeine, and oxycodone hcl.  Physical Examination: Vitals: BP 118/84   Ht 5\' 4"  (1.626 m)   Wt 153 lb (69.4 kg)   BMI 26.26 kg/m  GENERAL:  Bethany [name] is a 47 y.o. female appearing their stated age, alert and oriented x 3, in no apparent distress.  SKIN: no rashes or lesions, skin clean, dry, intact MSK:  C-spine: Decreased range of motion with flexion, extension, right rotation, limited by 25% in all planes with associated pain.  She does have reproduction of radicular symptoms down the left arm with rotation of the neck.  She has no midline tenderness.  She has left-sided paraspinal tenderness extending into the left trapezius.  She has positive Spurling's on the left, negative on the right.  Slightly decreased grip strength on the left 4/5, normal grip strength on the right.  Normal intrinsic and extrinsic hand strength bilaterally.  Shoulders: Left shoulder with decreased active  range of motion limited by 50% in all planes with pain, near full passive range of motion in all planes with some associated pain.  She has hypertonia of the deltoid, trapezius.  Positive tenderness over the bicipital groove, greater tuberosity.  No tenderness over the [facility] joint.  Positive Hawkins, positive Neer, positive empty can, negative speeds.  Rotator cuff strength 4/5 throughout with pain. Right shoulder with full range of motion without pain, weakness, instability NEURO: Slightly decreased sensation to light touch along the left hand and all fingers, normal sensation in the right upper extremity and the upper arms.  DTR 2/4 bicep, tricep, brachioradialis bilaterally. VASC: pulses 2+ and symmetric radial artery bilaterally, no edema  Radiology: XRAY: Lt shoulder XR 07/06/22 showing: - minimal GH degenerative changes  MRI: Lt shoulder MRI 08/25/22 showing: IMPRESSION: 1. No evidence of a rotator cuff tear of the left shoulder. 2. Mild acromioclavicular osteoarthritis. 3.  No acute osseous injury of the left shoulder.   Assessment & Plan Left cervical radiculopathy Acute left-sided neck and arm pain consistent with cervical radiculopathy.  She has had history of frozen shoulder and impingement syndrome in the past, but today's presentation is not consistent with that and is most consistent with cervical radiculopathy.  Plan: 1.  Diagnosis and treatment discussed in detail 2.  Rx Medrol Dosepak to take as directed.  Splane how to take medication and common side effects. 3.  Imaging: C-spine x-rays AP/lateral to rule out degenerative changes or other abnormalities.  I will reach out with results when available 4.  Recommend return to PT, referral was given 5.  Continue with heat or ice as needed. 6.  Follow-up 6 weeks to reevaluate, consider MRI if not improving.  Patient expressed understanding agreement with above.  All questions were answered Impingement syndrome, shoulder,  left Recurrent left shoulder pain with history of impingement syndrome and frozen shoulder.  No signs of frozen shoulder today.  Does have some signs of impingement, but history and exam most consistent with cervical radiculopathy.  Plan: Diagnosis and treatment discussed in detail.  Previous notes and imaging studies reviewed.  No significant RTC pathology on previous MRI> 2.  Rx Medrol Dosepak to take as directed.  Splane how to take medication and common side effects. 3.  Recommend return to PT, referral was given 4.  Continue with heat or ice as needed. 5.  Follow-up 6 weeks to reevaluate, consider MRI Cspine if not improving.  Patient expressed understanding agreement with above.  All questions were answered  Encounter Diagnoses  Name Primary?   Left cervical radiculopathy Yes   Impingement syndrome, shoulder, left     Orders Placed This Encounter  Procedures   DG Cervical Spine Complete   Ambulatory referral to Physical Therapy

## 2023-01-27 NOTE — Telephone Encounter (Signed)
PA for Quetiapine Fumate 150 mg tablets submitted to [facility] via CoverMyMeds portal.   Awaiting determination.

## 2023-01-27 NOTE — Patient Instructions (Signed)
Your left shoulder and arm pain is likely related to a condition called cervical radiculopathy (a pinched nerve in the neck).  1.  I sent a prescription for a Medrol Dosepak to your pharmacy to take as directed over the next 6 days.  This will help relieve inflammation/irritation of the nerve.  Should take with food.   2.  I placed an order for x-rays of your neck.  I will reach out with the results when available. 3.  You would benefit from a return to physical therapy.  We gave you a referral for this. 4.You can continue using heat to the area fir 15 minutes at a time 3-4 times a day to help with your symptoms 5. Watch head position when on computers, texting, when sleeping in bed - should in line with back to prevent further nerve traction and irritation. 6.  You should follow-up with me in 6-weeks to reassess.  If no improvement we will consider and MRI 7.  Don't hesitate to reach out with any questions or concerns.

## 2023-01-27 NOTE — Assessment & Plan Note (Signed)
>>  ASSESSMENT AND PLAN FOR LEFT CERVICAL RADICULOPATHY WRITTEN ON 01/27/2023  2:02 PM BY JACOBS, BRET C, DO  Acute left-sided neck and arm pain consistent with cervical radiculopathy.  She has had history of frozen shoulder and impingement syndrome in the past, but today's presentation is not consistent with that and is most consistent with cervical radiculopathy.  Plan: 1.  Diagnosis and treatment discussed in detail 2.  Rx Medrol  Dosepak to take as directed.  Splane how to take medication and common side effects. 3.  Imaging: C-spine x-rays AP/lateral to rule out degenerative changes or other abnormalities.  I will reach out with results when available 4.  Recommend return to PT, referral was given 5.  Continue with heat or ice as needed. 6.  Follow-up 6 weeks to reevaluate, consider MRI if not improving.  Patient expressed understanding agreement with above.  All questions were answered

## 2023-01-28 ENCOUNTER — Telehealth (HOSPITAL_COMMUNITY): Payer: Self-pay | Admitting: *Deleted

## 2023-01-28 NOTE — Telephone Encounter (Signed)
PA for Quetiapine 200 mg at bedtime submitted for expedited review via CoverMyMeds portal.   Awaiting determination.

## 2023-01-28 NOTE — Telephone Encounter (Signed)
Pt called requesting to start Zoloft for increased anxiety/panic attacks. Pt also reports increased crying spells. Denies SI. Pt says that the Gabapentin did not help the anxiety "at all". Pt was last seen on 12/31/22 and has f/u scheduled for 03/04/23. Please review and advise.

## 2023-01-29 ENCOUNTER — Other Ambulatory Visit (HOSPITAL_COMMUNITY): Payer: Self-pay | Admitting: *Deleted

## 2023-01-29 MED ORDER — SERTRALINE HCL 25 MG PO TABS
25.0000 mg | ORAL_TABLET | Freq: Every day | ORAL | 0 refills | Status: DC
Start: 2023-01-29 — End: 2023-02-24

## 2023-01-29 NOTE — Telephone Encounter (Signed)
Discontinue gabapentin.  She used to take the Zoloft 50 mg however when she was hospitalized in 2021 it was discontinued.  We can restart 25 mg daily until I see her on her next appointment.  Please call the pharmacy.

## 2023-01-29 NOTE — Telephone Encounter (Signed)
Done . Pt notified.  

## 2023-02-05 ENCOUNTER — Ambulatory Visit (INDEPENDENT_AMBULATORY_CARE_PROVIDER_SITE_OTHER): Payer: MEDICAID | Admitting: Clinical

## 2023-02-05 ENCOUNTER — Encounter (HOSPITAL_COMMUNITY): Payer: Self-pay | Admitting: Clinical

## 2023-02-05 DIAGNOSIS — F319 Bipolar disorder, unspecified: Secondary | ICD-10-CM | POA: Diagnosis not present

## 2023-02-05 DIAGNOSIS — F410 Panic disorder [episodic paroxysmal anxiety] without agoraphobia: Secondary | ICD-10-CM

## 2023-02-05 DIAGNOSIS — F4310 Post-traumatic stress disorder, unspecified: Secondary | ICD-10-CM | POA: Diagnosis not present

## 2023-02-05 DIAGNOSIS — F411 Generalized anxiety disorder: Secondary | ICD-10-CM | POA: Diagnosis not present
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
## 2023-02-14 ENCOUNTER — Other Ambulatory Visit: Payer: Self-pay

## 2023-02-14 ENCOUNTER — Ambulatory Visit: Payer: MEDICAID | Attending: Family Medicine

## 2023-02-14 DIAGNOSIS — R600 Localized edema: Secondary | ICD-10-CM

## 2023-02-14 DIAGNOSIS — M25512 Pain in left shoulder: Secondary | ICD-10-CM | POA: Diagnosis present

## 2023-02-14 DIAGNOSIS — M5412 Radiculopathy, cervical region: Secondary | ICD-10-CM | POA: Insufficient documentation

## 2023-02-14 DIAGNOSIS — M7542 Impingement syndrome of left shoulder: Secondary | ICD-10-CM | POA: Diagnosis not present

## 2023-02-14 DIAGNOSIS — M6281 Muscle weakness (generalized): Secondary | ICD-10-CM

## 2023-02-14 DIAGNOSIS — G8929 Other chronic pain: Secondary | ICD-10-CM | POA: Diagnosis present

## 2023-02-14 DIAGNOSIS — M542 Cervicalgia: Secondary | ICD-10-CM | POA: Diagnosis present
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
## 2023-02-18 ENCOUNTER — Encounter: Payer: Self-pay | Admitting: Family Medicine

## 2023-02-18 ENCOUNTER — Ambulatory Visit: Payer: MEDICAID | Admitting: Family Medicine

## 2023-02-18 VITALS — BP 128/80 | HR 82 | Temp 98.5°F | Ht 64.0 in | Wt 161.6 lb

## 2023-02-18 DIAGNOSIS — R7309 Other abnormal glucose: Secondary | ICD-10-CM

## 2023-02-18 DIAGNOSIS — Z6827 Body mass index (BMI) 27.0-27.9, adult: Secondary | ICD-10-CM | POA: Diagnosis not present

## 2023-02-18 DIAGNOSIS — G723 Periodic paralysis: Secondary | ICD-10-CM

## 2023-02-18 DIAGNOSIS — Z23 Encounter for immunization: Secondary | ICD-10-CM | POA: Diagnosis not present

## 2023-02-18 HISTORY — DX: Body mass index (BMI) 27.0-27.9, adult: Z68.27

## 2023-02-18 LAB — POCT GLYCOSYLATED HEMOGLOBIN (HGB A1C): Hemoglobin A1C: 6 % — AB (ref 4.0–5.6)

## 2023-02-18 NOTE — Patient Instructions (Signed)
I will check your A1c and potassium today. Keep up the GREAT work! Try those lifestyle management options we chatted about. Keep me updated! Come back in 3 months to see how you are doing (or sooner if needed!)

## 2023-02-18 NOTE — Assessment & Plan Note (Signed)
Increased weight gain likely due to understandable life stressors.  She has already been cognizant of different lifestyle changes she can make.  Discussed ensuring she eats between 67 and 8 PM to keep her metabolism running.  Also discussed switching full sugar sodas to [employer].  Continue exercising.  Handout provided for further recommendations.  Follow-up in 3 months or sooner if needed to discuss progress.

## 2023-02-18 NOTE — Progress Notes (Signed)
    SUBJECTIVE:   CHIEF COMPLAINT / HPI:   Weight concerns Has been gaining more weight since splitting from her partner. She has been feeling much happier after this relationship, but her weight is bothering her.  She has been trying to eat more fruits and not eat as many snacks such as potato chips.  She tries to keep her meals balanced.  She does not really eat much between 12 PM and 8 PM until she takes her nightly meds.  She also tries to walk 15 minutes a day around the block, but this sometimes can be hard.  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  She has been eating a banana a day to help keep her potassium levels higher.  She denies current complaints.  Would like to have her potassium checked again today.  PERTINENT  PMH / PSH: IDA, bipolar 1 disorder, lumbar spinal pain, adhesive capsulitis, left cervical radiculopathy, migraine without aura  OBJECTIVE:   BP 128/80   Pulse 82   Temp 98.5 F (36.9 C)   Ht 5\' 4"  (1.626 m)   Wt 161 lb 9.6 oz (73.3 kg)   SpO2 98%   BMI 27.74 kg/m   General: Alert and oriented, in NAD Skin: Warm, dry, and intact without lesions HEENT: NCAT, EOM grossly normal, midline nasal septum Cardiac: RRR, no m/r/g appreciated Respiratory: CTAB, breathing and speaking comfortably on RA Abdominal: Soft, nontender, nondistended, normoactive bowel sounds Extremities: Moves all extremities grossly equally Neurological: No gross focal deficit Psychiatric: Appropriate mood and affect   ASSESSMENT/PLAN:   Body mass index (BMI) 27.0-27.9, adult Increased weight gain likely due to understandable life stressors.  She has already been cognizant of different lifestyle changes she can make.  Discussed ensuring she eats between 49 and 8 PM to keep her metabolism running.  Also discussed switching full sugar sodas to [employer].  Continue exercising.  Handout provided for further recommendations.  Follow-up in 3 months or  sooner if needed to discuss progress.  Hypokalemic periodic paralysis Resolved.  Will recheck potassium today to ensure stability with current dietary regimen.  Elevated hemoglobin A1c Last A1c 5.8 in 06/2022.  Will recheck today.   Health maintenance Flu and COVID vaccines obtained without incident.  Patient reports getting colonoscopy in 2023; she will go to their office to have them send records to [name].  Janeal Holmes, MD [facility]

## 2023-02-18 NOTE — Assessment & Plan Note (Addendum)
Last A1c 5.8 in 06/2022.  Will recheck today.

## 2023-02-18 NOTE — Assessment & Plan Note (Signed)
Resolved.  Will recheck potassium today to ensure stability with current dietary regimen.

## 2023-02-19 LAB — POTASSIUM: Potassium: 4.4 mmol/L (ref 3.5–5.2)

## 2023-02-21 ENCOUNTER — Other Ambulatory Visit (HOSPITAL_COMMUNITY): Payer: Self-pay | Admitting: Psychiatry

## 2023-02-21 DIAGNOSIS — G4700 Insomnia, unspecified: Secondary | ICD-10-CM

## 2023-02-21 DIAGNOSIS — F3160 Bipolar disorder, current episode mixed, unspecified: Secondary | ICD-10-CM

## 2023-02-24 ENCOUNTER — Other Ambulatory Visit (HOSPITAL_COMMUNITY): Payer: Self-pay

## 2023-02-24 DIAGNOSIS — F3160 Bipolar disorder, current episode mixed, unspecified: Secondary | ICD-10-CM

## 2023-02-24 DIAGNOSIS — G4700 Insomnia, unspecified: Secondary | ICD-10-CM

## 2023-02-24 MED ORDER — QUETIAPINE FUMARATE 200 MG PO TABS: 200.0000 mg | ORAL_TABLET | Freq: Every day | ORAL | 0 refills | Status: DC

## 2023-02-24 MED ORDER — SERTRALINE HCL 25 MG PO TABS: 25.0000 mg | ORAL_TABLET | Freq: Every day | ORAL | 0 refills | Status: DC

## 2023-03-03 ENCOUNTER — Ambulatory Visit: Payer: MEDICAID | Attending: Family Medicine | Admitting: Physical Therapy

## 2023-03-03 ENCOUNTER — Encounter: Payer: Self-pay | Admitting: Physical Therapy

## 2023-03-03 DIAGNOSIS — R600 Localized edema: Secondary | ICD-10-CM | POA: Diagnosis present

## 2023-03-03 DIAGNOSIS — M25512 Pain in left shoulder: Secondary | ICD-10-CM | POA: Diagnosis present

## 2023-03-03 DIAGNOSIS — G8929 Other chronic pain: Secondary | ICD-10-CM | POA: Diagnosis present

## 2023-03-03 DIAGNOSIS — M6281 Muscle weakness (generalized): Secondary | ICD-10-CM | POA: Insufficient documentation

## 2023-03-03 DIAGNOSIS — M542 Cervicalgia: Secondary | ICD-10-CM | POA: Insufficient documentation
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
## 2023-03-04 ENCOUNTER — Encounter (HOSPITAL_COMMUNITY): Payer: Self-pay | Admitting: Psychiatry

## 2023-03-04 ENCOUNTER — Telehealth (HOSPITAL_BASED_OUTPATIENT_CLINIC_OR_DEPARTMENT_OTHER): Payer: MEDICAID | Admitting: Psychiatry

## 2023-03-04 VITALS — Wt 161.0 lb

## 2023-03-04 DIAGNOSIS — F3160 Bipolar disorder, current episode mixed, unspecified: Secondary | ICD-10-CM | POA: Diagnosis not present

## 2023-03-04 DIAGNOSIS — F4310 Post-traumatic stress disorder, unspecified: Secondary | ICD-10-CM | POA: Diagnosis not present

## 2023-03-04 DIAGNOSIS — G4700 Insomnia, unspecified: Secondary | ICD-10-CM

## 2023-03-04 MED ORDER — QUETIAPINE FUMARATE 200 MG PO TABS: 200.0000 mg | ORAL_TABLET | Freq: Every day | ORAL | 1 refills | Status: DC

## 2023-03-04 MED ORDER — SERTRALINE HCL 50 MG PO TABS: 50.0000 mg | ORAL_TABLET | Freq: Every day | ORAL | 1 refills | Status: DC

## 2023-03-04 MED ORDER — LITHIUM CARBONATE ER 300 MG PO TBCR: 300.0000 mg | EXTENDED_RELEASE_TABLET | Freq: Three times a day (TID) | ORAL | 1 refills | Status: DC
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
## 2023-03-05 ENCOUNTER — Encounter (HOSPITAL_COMMUNITY): Payer: Self-pay | Admitting: Clinical

## 2023-03-05 ENCOUNTER — Ambulatory Visit (HOSPITAL_COMMUNITY): Payer: MEDICAID | Admitting: Clinical

## 2023-03-05 DIAGNOSIS — F3160 Bipolar disorder, current episode mixed, unspecified: Secondary | ICD-10-CM

## 2023-03-05 DIAGNOSIS — F4310 Post-traumatic stress disorder, unspecified: Secondary | ICD-10-CM | POA: Diagnosis not present
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
## 2023-03-07 ENCOUNTER — Other Ambulatory Visit: Payer: Self-pay

## 2023-03-07 ENCOUNTER — Ambulatory Visit: Payer: MEDICAID | Admitting: Physical Therapy

## 2023-03-07 ENCOUNTER — Encounter: Payer: Self-pay | Admitting: Physical Therapy

## 2023-03-07 DIAGNOSIS — R600 Localized edema: Secondary | ICD-10-CM

## 2023-03-07 DIAGNOSIS — M542 Cervicalgia: Secondary | ICD-10-CM

## 2023-03-07 DIAGNOSIS — M6281 Muscle weakness (generalized): Secondary | ICD-10-CM

## 2023-03-07 DIAGNOSIS — G8929 Other chronic pain: Secondary | ICD-10-CM

## 2023-03-07 DIAGNOSIS — M25512 Pain in left shoulder: Secondary | ICD-10-CM | POA: Diagnosis not present

## 2023-03-10 ENCOUNTER — Ambulatory Visit (INDEPENDENT_AMBULATORY_CARE_PROVIDER_SITE_OTHER): Payer: MEDICAID | Admitting: Family Medicine

## 2023-03-10 ENCOUNTER — Encounter: Payer: Self-pay | Admitting: Family Medicine

## 2023-03-10 ENCOUNTER — Other Ambulatory Visit: Payer: Self-pay

## 2023-03-10 ENCOUNTER — Ambulatory Visit: Payer: MEDICAID

## 2023-03-10 VITALS — BP 106/64 | Ht 64.0 in | Wt 160.0 lb

## 2023-03-10 DIAGNOSIS — G8929 Other chronic pain: Secondary | ICD-10-CM

## 2023-03-10 DIAGNOSIS — M25512 Pain in left shoulder: Secondary | ICD-10-CM | POA: Diagnosis not present

## 2023-03-10 DIAGNOSIS — M6281 Muscle weakness (generalized): Secondary | ICD-10-CM

## 2023-03-10 DIAGNOSIS — M542 Cervicalgia: Secondary | ICD-10-CM

## 2023-03-10 DIAGNOSIS — M5412 Radiculopathy, cervical region: Secondary | ICD-10-CM | POA: Diagnosis not present

## 2023-03-10 MED ORDER — DICLOFENAC SODIUM 75 MG PO TBEC: 75.0000 mg | DELAYED_RELEASE_TABLET | Freq: Two times a day (BID) | ORAL | 1 refills | Status: DC

## 2023-03-10 MED ORDER — NAPROXEN 500 MG PO TABS: 500.0000 mg | ORAL_TABLET | Freq: Two times a day (BID) | ORAL | 1 refills | Status: DC | PRN
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
## 2023-03-10 NOTE — Progress Notes (Signed)
PCP: Bethany Georges, MD  Subjective:  CC: f/u cervical radiculopathy and left shoulder pain  HPI: Patient is a 47 y.o. female here for follow-up of left shoulder pain and cervical radiculopathy  Most recently seen 01/27/2023 for follow-up.   - She is status post glenohumeral and subacromial injections early in spring 2024 with limited response -  X-ray and MRI of the left shoulder earlier in the spring demonstrated mild AC OA without acute injury or rotator cuff tear.   At her last visit, she reported neck pain as well, and she was given a steroid Dosepak and recommended to continue with PT.   - no improvement with medrol dosepak - has continued with PT - has not been making progress - C-spine x-rays were obtained which demonstrated degenerative changes at C4/5/6.  Today, she just finished physical therapy.  She was able to participate more than prior in the exercises. Still having pain Using Tylenol prn & voltaren gel that has not been helpful Still radiating symptoms from neck down her left upper arm Still with decreased ROM  Past Medical History:  Diagnosis Date   Anemia    Anxiety    COVID-19    Depression    Family history of breast cancer 06/02/2018   Fibrocystic disease of both breasts    History of cervical dysplasia    laser surgery   Hyperglycemia 10/14/2020   Hypokalemia 12/03/2021   Insomnia    Left breast mass 06/30/2008   Low blood sugar 2001   pt checks BS in AM, controls with diet   Need for immunization against influenza 01/12/2022   Routine cervical smear 06/20/2020   Routine health maintenance 05/09/2014   Routine screening for STI (sexually transmitted infection) 01/12/2022   S/P mastectomy, bilateral 10/27/2018   Surgery follow-up 10/19/2018   UTI (urinary tract infection) 11/01/2020   Vaginal Pap smear, abnormal     Current Outpatient Medications on File Prior to Visit  Medication Sig Dispense Refill   acetaminophen (TYLENOL) 650 MG CR tablet Take  1,300 mg by mouth every 8 (eight) hours as needed for pain (or headaches).     diclofenac Sodium (VOLTAREN) 1 % GEL APPLY 4 G TOPICALLY 4 TIMES A DAY (Patient taking differently: Apply 4 g topically 4 (four) times daily as needed (for pain- affected area).) 100 g 1   famotidine (PEPCID) 20 MG tablet TAKE 1 TABLET BY MOUTH TWICE A DAY 60 tablet 0   lidocaine 4 % Place 1 patch onto the skin daily as needed (for pain- affected area).     linaclotide (LINZESS) 145 MCG CAPS capsule TAKE 1 CAPSULE BY MOUTH EVERY DAY BEFORE BREAKFAST - INSURANCE WILL PAY ON 6/25 (Patient taking differently: Take 145 mcg by mouth daily before breakfast.) 30 capsule 3   lithium carbonate (LITHOBID) 300 MG ER tablet Take 1 tablet (300 mg total) by mouth 3 (three) times daily. 90 tablet 1   methylPREDNISolone (MEDROL DOSEPAK) 4 MG TBPK tablet Dispense 1 box/package -- take as directed on package 1 each 0   ondansetron (ZOFRAN) 4 MG tablet Take 1 tablet (4 mg total) by mouth every 8 (eight) hours as needed for nausea or vomiting. 20 tablet 0   pantoprazole (PROTONIX) 40 MG tablet TAKE 1 TABLET BY MOUTH EVERY DAY 90 tablet 1   promethazine (PHENERGAN) 12.5 MG tablet Take 1 tablet (12.5 mg total) by mouth every 6 (six) hours as needed for nausea or vomiting. 30 tablet 0   QUEtiapine (SEROQUEL) 200 MG  tablet Take 1 tablet (200 mg total) by mouth at bedtime. 30 tablet 1   sertraline (ZOLOFT) 50 MG tablet Take 1 tablet (50 mg total) by mouth daily. 30 tablet 1   No current facility-administered medications on file prior to visit.    Past Surgical History:  Procedure Laterality Date   AUGMENTATION MAMMAPLASTY Bilateral    BREAST FIBROADENOMA SURGERY  2011, 2008   benign    BREAST IMPLANT REMOVAL Bilateral 10/31/2022   Procedure: REMOVAL BREAST IMPLANTS;  Surgeon: Peggye Form, DO;  Location: [facility];  Service: Plastics;  Laterality: Bilateral;   BREAST RECONSTRUCTION WITH PLACEMENT OF TISSUE EXPANDER  AND FLEX HD (ACELLULAR HYDRATED DERMIS) Bilateral 10/19/2018   Procedure: BREAST RECONSTRUCTION WITH PLACEMENT OF TISSUE EXPANDER AND FLEX HD (ACELLULAR HYDRATED DERMIS);  Surgeon: Peggye Form, DO;  Location: [facility];  Service: Plastics;  Laterality: Bilateral;   CESAREAN SECTION  04/22/1993   HYSTEROSCOPY WITH NOVASURE N/A 01/02/2016   Procedure: NOVASURE;  Surgeon: Adam Phenix, MD;  Location: [facility];  Service: Gynecology;  Laterality: N/A;   MASTECTOMY Bilateral 2019   NIPPLE SPARING MASTECTOMY Bilateral 10/19/2018   Procedure: BILATERAL NIPPLE SPARING MASTECTOMY;  Surgeon: Griselda Miner, MD;  Location: [facility];  Service: General;  Laterality: Bilateral;   REMOVAL OF BILATERAL TISSUE EXPANDERS WITH PLACEMENT OF BILATERAL BREAST IMPLANTS Bilateral 12/30/2018   Procedure: REMOVAL OF BILATERAL TISSUE EXPANDERS WITH PLACEMENT OF BILATERAL BREAST IMPLANTS;  Surgeon: Peggye Form, DO;  Location: [facility];  Service: Plastics;  Laterality: Bilateral;   TUBAL LIGATION  04/22/2001    Allergies  Allergen Reactions   Penicillins Hives and Nausea And Vomiting   Tapentadol Hives and Other (See Comments)    Name brand is NUCYNTA    Lamictal [Lamotrigine] Rash   Codeine Hives   Oxycodone Hcl Hives    There were no vitals taken for this visit.      No data to display              No data to display              Objective:  GENERAL:  Bethany [name] is a 47 y.o. female appearing their stated age, alert and oriented x 3, in no apparent distress.  SKIN: no rashes or lesions, skin clean, dry, intact MSK:  C-spine: Decreased range of motion with flexion, extension, right rotation, limited by 10% in all planes with associated pain.  She has no midline tenderness.  She has left-sided paraspinal tenderness extending into the left trapezius with associated hypertonia.  She has positive Spurling's on the left again today, negative on the right.  Slightly decreased  grip strength on the left 5-/5, normal grip strength on the right.  Normal intrinsic and extrinsic hand strength bilaterally.   Shoulders: Left shoulder with decreased active range of motion limited by 50% in all planes with pain, near full passive range of motion in all planes with some associated pain.  She has hypertonia of the  trapezius.  Mild tenderness over the bicipital groove, greater tuberosity.  No tenderness over the [facility] joint.  + Hawkins, + Neer, + empty can, negative speeds.  Rotator cuff strength 4+/5 throughout with pain. Right shoulder with full range of motion without pain, weakness, instability NEURO: normal to light touch upper ext b/lDTR 2/4 bicep, tricep, brachioradialis bilaterally. VASC: pulses 2+ and symmetric radial artery bilaterally, no edema   Radiology: XRAY:  CSPINE XR 01/27/23 personally  reviewed and interpreted by me today showing: - decreases cervical lordosis - multilevel degenerative changes most prominent at C4-5, C5-6  Lt shoulder XR 07/06/22 showing: - minimal GH degenerative changes   MRI: Lt shoulder MRI 08/25/22 showing: IMPRESSION: 1. No evidence of a rotator cuff tear of the left shoulder. 2. Mild acromioclavicular osteoarthritis. 3.  No acute osseous injury of the left shoulder.   Assessment & Plan Left cervical radiculopathy Ongoing Lt-sided neck and shoulder pain with history of frozen shoulder and impingement syndrome in the past, but normal MRI shoulder recently. - limited improvement with 6+ weeks of PT and provider-directed therapy - no improvement with 6+ weeks of NSAIDs - no improvement with oral steroids - would be interested in surgery or injections if indicated  PLAN: - Rx for Voltaren 75mg  po twice daily as needed with food.  Explained how to take medication & common side effects.  GI precautions advised.  Aware of potential interactions with Zoloft and Lithium - ok for short-term usage while we are getting MRI.   - MRI Cspine r/o  herniated disc, pinched nerve.  Has failed extensive conservative therapy and would be interested in injections or surgery if indicated - cont with PT in the interim - f/u pending MRI to review results and discuss next steps of tx Acute pain of left shoulder Recurrent left shoulder pain with history of impingement syndrome and frozen shoulder. No signs of frozen shoulder today. Does have some signs of impingement, but history and exam most consistent with cervical radiculopathy.  - normal MRI shoulder recently  PLAN: - cont with PT - MRI Cspine as noted above to r/o referred pain causing symptoms - f/u pending MRI to review results and discuss next steps of tx  Patient expressed understanding and agreement with above

## 2023-03-10 NOTE — Assessment & Plan Note (Signed)
>>  ASSESSMENT AND PLAN FOR LEFT CERVICAL RADICULOPATHY WRITTEN ON 03/10/2023  2:01 PM BY JACOBS, BRET C, DO  Ongoing Lt-sided neck and shoulder pain with history of frozen shoulder and impingement syndrome in the past, but normal MRI shoulder recently. - limited improvement with 6+ weeks of PT and provider-directed therapy - no improvement with 6+ weeks of NSAIDs - no improvement with oral steroids - would be interested in surgery or injections if indicated  PLAN: - Rx for Voltaren  75mg  po twice daily as needed with food.  Explained how to take medication & common side effects.  GI precautions advised.  Aware of potential interactions with Zoloft  and Lithium  - ok for short-term usage while we are getting MRI.   - MRI Cspine r/o herniated disc, pinched nerve.  Has failed extensive conservative therapy and would be interested in injections or surgery if indicated - cont with PT in the interim - f/u pending MRI to review results and discuss next steps of tx

## 2023-03-10 NOTE — Assessment & Plan Note (Signed)
Ongoing Lt-sided neck and shoulder pain with history of frozen shoulder and impingement syndrome in the past, but normal MRI shoulder recently. - limited improvement with 6+ weeks of PT and provider-directed therapy - no improvement with 6+ weeks of NSAIDs - no improvement with oral steroids - would be interested in surgery or injections if indicated  PLAN: - Rx for Voltaren 75mg  po twice daily as needed with food.  Explained how to take medication & common side effects.  GI precautions advised.  Aware of potential interactions with Zoloft and Lithium - ok for short-term usage while we are getting MRI.   - MRI Cspine r/o herniated disc, pinched nerve.  Has failed extensive conservative therapy and would be interested in injections or surgery if indicated - cont with PT in the interim - f/u pending MRI to review results and discuss next steps of tx

## 2023-03-10 NOTE — Patient Instructions (Signed)
We are going to order an Mri of your neck to further assess your condition.  Once this is approved you will be able to schedule.  I sent a presciption for Voltaren 75mg  1 tab by mouth twice daily with food for the next 1-2 weeks, then as needed.  You should not take this with other NSAIDs/anti-inflammatories.   You should reach out to [name] 5-7 days after the MRI is completed.  We will reach out sooner with the results if available.

## 2023-03-11 ENCOUNTER — Encounter: Payer: Self-pay | Admitting: Plastic Surgery

## 2023-03-11 ENCOUNTER — Ambulatory Visit (INDEPENDENT_AMBULATORY_CARE_PROVIDER_SITE_OTHER): Payer: MEDICAID | Admitting: Plastic Surgery

## 2023-03-11 VITALS — BP 116/70 | HR 96 | Ht 64.0 in | Wt 160.0 lb

## 2023-03-11 DIAGNOSIS — N650 Deformity of reconstructed breast: Secondary | ICD-10-CM | POA: Diagnosis not present

## 2023-03-11 DIAGNOSIS — Z803 Family history of malignant neoplasm of breast: Secondary | ICD-10-CM

## 2023-03-11 DIAGNOSIS — Z9013 Acquired absence of bilateral breasts and nipples: Secondary | ICD-10-CM

## 2023-03-11 DIAGNOSIS — F319 Bipolar disorder, unspecified: Secondary | ICD-10-CM

## 2023-03-11 DIAGNOSIS — N644 Mastodynia: Secondary | ICD-10-CM

## 2023-03-11 DIAGNOSIS — Z9010 Acquired absence of unspecified breast and nipple: Secondary | ICD-10-CM | POA: Insufficient documentation

## 2023-03-11 NOTE — Progress Notes (Signed)
   Subjective:    Patient ID: Bethany [name], female    DOB: August 16, 1975, 47 y.o.   MRN: [id number]  The patient is a 47 year old female here for follow-up on her breasts.  She had a very strong history of breast cancer in her family and she had noticed some tenderness in her breast in 2020.  She had D density breasts with concerns about being able to follow her breast exams.  She decided on bilateral mastectomies and this was done in June 2020.  She had expanders put in and then went for exchange in September.  She had Mentor smooth moderate plus profile extra gel 295 cc implants placed.  She decided to have them removed due to pain and this was done in July 2024.  She did not want any implants put back in.  The patient now has some distortion and some scarring due to just the way that the flaps have healed.      Review of Systems  Constitutional: Negative.   HENT: Negative.    Eyes: Negative.   Respiratory: Negative.  Negative for chest tightness and shortness of breath.   Cardiovascular: Negative.   Gastrointestinal: Negative.   Endocrine: Negative.   Genitourinary: Negative.   Musculoskeletal: Negative.        Objective:   Physical Exam Vitals and nursing note reviewed.  Constitutional:      Appearance: Normal appearance.  HENT:     Head: Atraumatic.  Cardiovascular:     Rate and Rhythm: Normal rate.     Pulses: Normal pulses.  Pulmonary:     Effort: Pulmonary effort is normal.  Musculoskeletal:        General: Deformity present. No swelling or signs of injury.  Skin:    General: Skin is warm.     Capillary Refill: Capillary refill takes less than 2 seconds.     Coloration: Skin is not jaundiced.  Neurological:     Mental Status: She is alert and oriented to person, place, and time.  Psychiatric:        Mood and Affect: Mood normal.        Behavior: Behavior normal.        Thought Content: Thought content normal.        Judgment: Judgment normal.         Assessment & Plan:     ICD-10-CM   1. Bipolar 1 disorder (HCC)  F31.9     2. Breast pain, right  N64.4     3. 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  This would not be in an attempt to give her breasts or any size of her breast.  The patient is aware of that.  We will have to do some capsule release and scar release as well.  Pictures were obtained of the patient and placed in the chart with the patient's or guardian's permission.

## 2023-03-14 ENCOUNTER — Encounter: Payer: Self-pay | Admitting: Physical Therapy

## 2023-03-14 ENCOUNTER — Ambulatory Visit: Payer: MEDICAID | Admitting: Physical Therapy

## 2023-03-14 DIAGNOSIS — G8929 Other chronic pain: Secondary | ICD-10-CM

## 2023-03-14 DIAGNOSIS — M6281 Muscle weakness (generalized): Secondary | ICD-10-CM

## 2023-03-14 DIAGNOSIS — M25512 Pain in left shoulder: Secondary | ICD-10-CM | POA: Diagnosis not present
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
## 2023-03-17 ENCOUNTER — Ambulatory Visit: Payer: MEDICAID | Admitting: Physical Therapy

## 2023-03-17 ENCOUNTER — Encounter: Payer: Self-pay | Admitting: Physical Therapy

## 2023-03-17 DIAGNOSIS — G8929 Other chronic pain: Secondary | ICD-10-CM

## 2023-03-17 DIAGNOSIS — M25512 Pain in left shoulder: Secondary | ICD-10-CM | POA: Diagnosis not present

## 2023-03-17 DIAGNOSIS — M6281 Muscle weakness (generalized): Secondary | ICD-10-CM
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
## 2023-03-24 ENCOUNTER — Ambulatory Visit: Payer: MEDICAID | Attending: Family Medicine

## 2023-03-24 DIAGNOSIS — M542 Cervicalgia: Secondary | ICD-10-CM | POA: Insufficient documentation

## 2023-03-24 DIAGNOSIS — M25512 Pain in left shoulder: Secondary | ICD-10-CM | POA: Insufficient documentation

## 2023-03-24 DIAGNOSIS — G8929 Other chronic pain: Secondary | ICD-10-CM | POA: Diagnosis present

## 2023-03-24 DIAGNOSIS — M6281 Muscle weakness (generalized): Secondary | ICD-10-CM | POA: Diagnosis present
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
## 2023-03-27 ENCOUNTER — Other Ambulatory Visit (HOSPITAL_COMMUNITY): Payer: Self-pay | Admitting: Psychiatry

## 2023-03-27 DIAGNOSIS — F3160 Bipolar disorder, current episode mixed, unspecified: Secondary | ICD-10-CM

## 2023-03-27 DIAGNOSIS — F4310 Post-traumatic stress disorder, unspecified: Secondary | ICD-10-CM

## 2023-03-27 DIAGNOSIS — G4700 Insomnia, unspecified: Secondary | ICD-10-CM

## 2023-03-28 ENCOUNTER — Ambulatory Visit: Payer: MEDICAID

## 2023-03-28 DIAGNOSIS — G8929 Other chronic pain: Secondary | ICD-10-CM

## 2023-03-28 DIAGNOSIS — M6281 Muscle weakness (generalized): Secondary | ICD-10-CM

## 2023-03-28 DIAGNOSIS — M25512 Pain in left shoulder: Secondary | ICD-10-CM | POA: Diagnosis not present
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
## 2023-03-30 ENCOUNTER — Other Ambulatory Visit (HOSPITAL_COMMUNITY): Payer: Self-pay | Admitting: Psychiatry

## 2023-03-30 ENCOUNTER — Ambulatory Visit
Admission: RE | Admit: 2023-03-30 | Discharge: 2023-03-30 | Disposition: A | Discharge status: Home / Self Care | Payer: MEDICAID | Source: Ambulatory Visit | Attending: Family Medicine | Admitting: Family Medicine

## 2023-03-30 DIAGNOSIS — F3160 Bipolar disorder, current episode mixed, unspecified: Secondary | ICD-10-CM

## 2023-03-30 DIAGNOSIS — M5412 Radiculopathy, cervical region: Secondary | ICD-10-CM

## 2023-03-31 ENCOUNTER — Ambulatory Visit: Payer: MEDICAID

## 2023-03-31 DIAGNOSIS — M25512 Pain in left shoulder: Secondary | ICD-10-CM | POA: Diagnosis not present

## 2023-03-31 DIAGNOSIS — M6281 Muscle weakness (generalized): Secondary | ICD-10-CM

## 2023-03-31 DIAGNOSIS — G8929 Other chronic pain: Secondary | ICD-10-CM
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
## 2023-04-04 ENCOUNTER — Encounter (HOSPITAL_COMMUNITY): Payer: Self-pay | Admitting: Clinical

## 2023-04-04 ENCOUNTER — Ambulatory Visit (INDEPENDENT_AMBULATORY_CARE_PROVIDER_SITE_OTHER): Payer: MEDICAID | Admitting: Clinical

## 2023-04-04 DIAGNOSIS — F319 Bipolar disorder, unspecified: Secondary | ICD-10-CM | POA: Diagnosis not present

## 2023-04-04 DIAGNOSIS — F4310 Post-traumatic stress disorder, unspecified: Secondary | ICD-10-CM

## 2023-04-04 NOTE — Progress Notes (Unsigned)
THERAPIST PROGRESS NOTE  Session Time: 11:05-11:33am  Session #14  Virtual Visit via Video Note  I connected with Bethany [name] on 04/04/23 at 11:00 AM EST by a video enabled telemedicine application and verified that I am speaking with the correct person using two identifiers.  Location: Patient: home Provider: [facility] Outpatient therapy office   I discussed the limitations of evaluation and management by telemedicine and the availability of in person appointments. The patient expressed understanding and agreed to proceed.  I discussed the assessment and treatment plan with the patient. The patient was provided an opportunity to ask questions and all were answered. The patient agreed with the plan and demonstrated an understanding of the instructions.   The patient was advised to call back or seek an in-person evaluation if the symptoms worsen or if the condition fails to improve as anticipated.  I provided 28 minutes of non-face-to-face time during this encounter.  Lynnell Chad, LCSW    Participation Level: Active  Behavioral Response: Casual Alert Negative and Anxious  Type of Therapy: Individual Therapy  Treatment Goals addressed:  Goal: STG: Report a decrease in anxiety symptoms as evidenced by an overall reduction in anxiety score by a minimum of 25% on the Generalized Anxiety Disorder Scale (GAD-7)  Goal: LTG: Explore personal core beliefs, rules and assumptions, and cognitive distortions through therapist using Cognitive Behavioral Therapy; learn how to develop replacement thoughts and challenge unhelpful thoughts.   Goal: STG: Learn DBT skills that might help with emotion regulation, distress tolerance, interpersonal effectiveness, and mindfulness  Goal: LTG: Increase ability to regulate mood as evidenced by fewer highs and fewer lows that impact Kenitra's life  Goal: STG: Be free of suicidal thoughts; call crisis hotline if having suicidal thoughts    Goal: LTG: Learn a variety of coping skills and demonstrate the ability to use them to decrease feelings of sadness, anger, and fear and increase feelings of happiness, peace, and powerfulness AEB gauging those emotions on 1-10 scale.   Goal: STG: Learn breathing techniques and grounding techniques at an age-appropriate level and demonstrate mastery in session then report independent use of these skills out of session.   Goal: LTG: Work on physical health, increasing endurance of walking and decreasing areas of body she is dissatisfied, such as specifically reducing her abdomen size.  Goal: LTG: Explore and resolve issues related to the various traumatic events in her life  Goal: STG: Learn about typical long term/residual effects of traumatic life experiences  Goal: STG: Learn coping skills and increase resilience through application of CBT techniques and through processing of life in a shame framework   Goal: LTG: Increase ability to trust other people, tolerate being around people, and endure noise that currently renders patient incapable of being around other people for long.  Goal: LTG: Improve ability to see world as others do, as well as ability to accept that they do not see the world as she does.  Goal: STG: Be able to use reality testing on her auditory/visual hallucinations and paranoid beliefs  Goal: LTG: Be more realistic in her giving habits surrounding the holidays instead of "going overboard" and set desirable boundaries with others as well as herself   ProgressTowards Goals: Progressing  Interventions: Solution Focused and Supportive  Summary: [name] is a 47 y.o. female who presents with Generalized Anxiety Disorder with panic attacks, Bipolar Disorder with psychotic features, and Post-Traumatic Stress Disorder.  She presented oriented x5 and stated she was feeling "not  good."  CSW evaluated patient's medication compliance, use of coping tools, and self-care, as applicable.    She is upset that she is seeing things again, states this went away but now it has come back.  She had an unpleasant event when a friend was over and patient saw spiders, was swatting them away in order to prevent them from being bitten.  Her friend reacted negatively and told her she needed mental health help.  CSW talked with her about reality testing following by acting "as if" what she was told was accurate, such as asking her friend if she also saw spiders and when told 'no,' deciding that must mean there are no spiders.  She is skeptical but is willing to try this technique.  She feels that when these things happen it makes her look like a fool, adding that "I take one step forward and 2 steps back."  CSW expressed that it seems she takes 2 steps forward, then 1 back, but that still indicates progress.   Patient is not sleeping, especially at night.  A lot of this is due to fear of seeing things.  She asked me to let her doctor know.  Patient is not exercising as was agreed at last session.  She stated she is absolutely unwilling to walk outside during cold weather but she is hopeful one of her daughters will get her a walking pad that can scoot under the bed.  We also talked about how much she can spend on her children for the holidays and CSW did some basic math for her to share the results of how much money she has, split as many ways as she wishes to spend it.  She specifically did not want to start the forgiveness curriculum this day.  Suicidal/Homicidal: No without intent/plan  Therapist Response:  Patient is progressing AEB engaging in scheduled therapy session.  Throughout the session, CSW gave patient the opportunity to explore thoughts and feelings associated with current life situations and past/present stressors.   CSW challenged patient gently and appropriately to consider different ways of looking at reported issues. CSW encouraged patient's expression of feelings and validated these  using empathy, active listening, open body language, and unconditional positive regard.   CSW encouraged patient to schedule more therapy sessions for the future, as needed.   Recommendations:  Return to therapy in 4 weeks, review forgiveness material sent to her, start walking as exercise on a regular basis   Plan: Return in 4 weeks on 1/13  Diagnosis: Bipolar disorder with psychotic features (HCC)  Post-traumatic stress disorder, unspecified  Collaboration of Care: Psychiatrist AEB - doctor can read therapy notes, therapist reads doctor notes and medication notes prior to sessions  Patient/Guardian was advised Release of Information must be obtained prior to any record release in order to collaborate their care with an outside provider. Patient/Guardian was advised if they have not already done so to contact the registration department to sign all necessary forms in order for [name] to release information regarding their care.   Consent: Patient/Guardian gives verbal consent for treatment and assignment of benefits for services provided during this visit. Patient/Guardian expressed understanding and agreed to proceed.   Lynnell Chad, LCSW 04/04/2023

## 2023-04-07 ENCOUNTER — Telehealth (INDEPENDENT_AMBULATORY_CARE_PROVIDER_SITE_OTHER): Payer: MEDICAID | Admitting: Family Medicine

## 2023-04-07 ENCOUNTER — Other Ambulatory Visit: Payer: Self-pay | Admitting: Family Medicine

## 2023-04-07 ENCOUNTER — Encounter: Payer: Self-pay | Admitting: Family Medicine

## 2023-04-07 VITALS — Ht 64.0 in | Wt 168.0 lb

## 2023-04-07 DIAGNOSIS — M4722 Other spondylosis with radiculopathy, cervical region: Secondary | ICD-10-CM | POA: Diagnosis not present

## 2023-04-07 NOTE — Patient Instructions (Addendum)
Hello Bethany [name],  It was a pleasure speaking with you today.  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  You should continue with your physical therapy as you are doing.  You can continue with your naproxen as needed as you are doing.  I placed a referral for you to see Dr. Claria Dice, MD - [facility] to discuss possible cervical epidural injections, which are cortisone injections in the spine near the area of abnormality on your MRI. Here is his website and office information: [email or web address] To Schedule a Physician Appointment: [phone] Address: [address] [place], [place] [address]  Please let me know if you have any questions

## 2023-04-07 NOTE — Assessment & Plan Note (Signed)
Ongoing left-sided neck and shoulder pain with history of frozen shoulder and impingement syndrome in the past, current symptoms most consistent with left-sided radiculopathy.  MRI showing cervical spondylosis with disc osteophyte complex at C4-5 with mild to moderate left C5 foraminal stenosis, as well as disc osteophyte at C5-6 with mild left C6 foraminal stenosis.  MRI findings correlate with her symptoms  Plan: -MRI findings reviewed with patient in detail -Treatment options discussed including ongoing physical therapy, referral to physiatry for consideration of cervical epidural, referral to spine surgeon -Patient prefers trial of epidural, referral to [facility] - Dr Claria Dice for consideration of cervical epidural.  Patient has seen him in the past for other issues -She will continue her physical therapy as she is doing -Continue her naproxen as she is doing next number can follow-up with me pending evaluation with Dr. Regino Schultze

## 2023-04-07 NOTE — Progress Notes (Addendum)
DATE OF VISIT: 04/07/2023        Bethany [name] DOB: 09/20/1975 MRN: [id number]  Virtual Visit via Video Note  I connected with Bethany [name] on 04/07/23 at  3:45 PM EST by a video enabled telemedicine application and verified that I am speaking with the correct person using two identifiers.  Location: Patient: In her car, she was parked Provider: Office   I discussed the limitations of evaluation and management by telemedicine and the availability of in person appointments. The patient expressed understanding and agreed to proceed.    CC:  f/u MRI results  History of present Illness: Bethany [name] is a 47 y.o. female who presents for a follow-up visit to review C-spine MRI results Last seen by me 03/10/2023 for cervical radiculopathy Since last visit has been doing ongoing PT -Has been helpful, but still having left-sided radicular symptoms -Has several upcoming PT sessions scheduled, she reports her last session scheduled for 05/08/23 Has been taken Naproxen bid prn - worse if missing doses Denies any new symptoms space-still radiating symptoms from her neck down her left upper arm  Medications:  Outpatient Encounter Medications as of 04/07/2023  Medication Sig   acetaminophen (TYLENOL) 650 MG CR tablet Take 1,300 mg by mouth every 8 (eight) hours as needed for pain (or headaches).   famotidine (PEPCID) 20 MG tablet TAKE 1 TABLET BY MOUTH TWICE A DAY   lidocaine 4 % Place 1 patch onto the skin daily as needed (for pain- affected area).   linaclotide (LINZESS) 145 MCG CAPS capsule TAKE 1 CAPSULE BY MOUTH EVERY DAY BEFORE BREAKFAST - INSURANCE WILL PAY ON 6/25 (Patient taking differently: Take 145 mcg by mouth daily before breakfast.)   lithium carbonate (LITHOBID) 300 MG ER tablet Take 1 tablet (300 mg total) by mouth 3 (three) times daily.   methylPREDNISolone (MEDROL DOSEPAK) 4 MG TBPK tablet Dispense 1 box/package -- take as directed on package   naproxen (NAPROSYN) 500 MG  tablet Take 1 tablet (500 mg total) by mouth 2 (two) times daily as needed.   ondansetron (ZOFRAN) 4 MG tablet Take 1 tablet (4 mg total) by mouth every 8 (eight) hours as needed for nausea or vomiting.   pantoprazole (PROTONIX) 40 MG tablet TAKE 1 TABLET BY MOUTH EVERY DAY   promethazine (PHENERGAN) 12.5 MG tablet Take 1 tablet (12.5 mg total) by mouth every 6 (six) hours as needed for nausea or vomiting.   QUEtiapine (SEROQUEL) 200 MG tablet Take 1 tablet (200 mg total) by mouth at bedtime.   sertraline (ZOLOFT) 50 MG tablet Take 1 tablet (50 mg total) by mouth daily.   [DISCONTINUED] pantoprazole (PROTONIX) 40 MG tablet TAKE 1 TABLET BY MOUTH EVERY DAY   No facility-administered encounter medications on file as of 04/07/2023.    Allergies: is allergic to penicillins, tapentadol, lamictal [lamotrigine], codeine, and oxycodone hcl.  Physical Examination: Vitals: Ht 5\' 4"  (1.626 m)   Wt 168 lb (76.2 kg)   BMI 28.84 kg/m  GENERAL:  Bethany [name] is a 47 y.o. female appearing their stated age, alert and oriented x 3, in no apparent distress.  In her vehicle MSK: Normal gross upper extremity strength.  Normal gross neck motion.  No focal deficits NEURO: No focal deficits PSYCH: Appropriate mood, answering questions appropriately  Radiology: MRI C-spine 03/30/2023 at [facility] IMPRESSION: 1. Left eccentric disc osteophyte complex at C4-5 with resultant mild to moderate left C5 foraminal stenosis. 2. Degenerative disc osteophyte at C5-6 with  resultant mild to moderate right and mild left C6 foraminal stenosis. 3. Central disc extrusion at T1-2 without stenosis.  Assessment & Plan Cervical spondylosis with radiculopathy Ongoing left-sided neck and shoulder pain with history of frozen shoulder and impingement syndrome in the past, current symptoms most consistent with left-sided radiculopathy.  MRI showing cervical spondylosis with disc osteophyte complex at C4-5 with mild to  moderate left C5 foraminal stenosis, as well as disc osteophyte at C5-6 with mild left C6 foraminal stenosis.  MRI findings correlate with her symptoms  Plan: -MRI findings reviewed with patient in detail -Treatment options discussed including ongoing physical therapy, referral to physiatry for consideration of cervical epidural, referral to spine surgeon -Patient prefers trial of epidural, referral to [facility] - Dr Claria Dice for consideration of cervical epidural.  Patient has seen him in the past for other issues -She will continue her physical therapy as she is doing -Continue her naproxen as she is doing next number can follow-up with me pending evaluation with Dr. Regino Schultze   I discussed the assessment and treatment plan with the patient. The patient was provided an opportunity to ask questions and all were answered. The patient agreed with the plan and demonstrated an understanding of the instructions.   The patient was advised to call back or seek an in-person evaluation if the symptoms worsen or if the condition fails to improve as anticipated.  I provided 15 minutes of non-face-to-face time during this encounter.   Andi Devon, DO  Encounter Diagnosis  Name Primary?   Cervical spondylosis with radiculopathy Yes    Orders Placed This Encounter  Procedures   Ambulatory referral to Physical Medicine Rehab

## 2023-04-08 ENCOUNTER — Ambulatory Visit: Payer: MEDICAID

## 2023-04-08 DIAGNOSIS — M542 Cervicalgia: Secondary | ICD-10-CM

## 2023-04-08 DIAGNOSIS — M25512 Pain in left shoulder: Secondary | ICD-10-CM | POA: Diagnosis not present

## 2023-04-08 DIAGNOSIS — M6281 Muscle weakness (generalized): Secondary | ICD-10-CM

## 2023-04-08 DIAGNOSIS — G8929 Other chronic pain: Secondary | ICD-10-CM
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
## 2023-04-09 ENCOUNTER — Ambulatory Visit (INDEPENDENT_AMBULATORY_CARE_PROVIDER_SITE_OTHER): Payer: MEDICAID | Admitting: Surgical

## 2023-04-09 ENCOUNTER — Encounter: Payer: Self-pay | Admitting: Surgical

## 2023-04-09 VITALS — BP 126/77 | HR 95

## 2023-04-09 DIAGNOSIS — N644 Mastodynia: Secondary | ICD-10-CM

## 2023-04-09 DIAGNOSIS — Z9886 Personal history of breast implant removal: Secondary | ICD-10-CM

## 2023-04-09 DIAGNOSIS — Z9013 Acquired absence of bilateral breasts and nipples: Secondary | ICD-10-CM

## 2023-04-09 MED ORDER — CEPHALEXIN 500 MG PO CAPS: 500.0000 mg | ORAL_CAPSULE | Freq: Four times a day (QID) | ORAL | 0 refills | Status: AC

## 2023-04-09 MED ORDER — OXYCODONE HCL 5 MG PO TABS: 5.0000 mg | ORAL_TABLET | Freq: Four times a day (QID) | ORAL | 0 refills | Status: AC | PRN

## 2023-04-09 NOTE — Addendum Note (Signed)
Addended byKeenan Bachelor on: 04/09/2023 09:42 AM   Modules accepted: Orders

## 2023-04-09 NOTE — Progress Notes (Signed)
Patient ID: Bethany [name], female    DOB: November 14, 1975, 47 y.o.   MRN: [id number]  Chief Complaint  Patient presents with   Pre-op Exam      ICD-10-CM   1. Breast pain, right  N64.4     2. Acquired absence of both breasts  Z90.13     3. History of bilateral removal of breast implants  Z98.86       History of Present Illness: Bethany [name] is a 47 y.o.  female  with a history of bilateral mastectomies with subsequent breast reconstruction.  She subsequently had the implants removed due to pain in July of this year.  She presents for preoperative evaluation for upcoming procedure, excision of bilateral breast scar contracture and lipo filling of bilateral breasts, scheduled for 05/07/2023 with Dr. Ulice Bold.  The patient has not had problems with anesthesia. No history of DVT/PE.  No family history of DVT/PE.  No family or personal history of bleeding or clotting disorders.  Patient is not currently taking any blood thinners.  No history of CVA/MI.   Job: Disabled  PMH Significant for: Breast cancer with subsequent bilateral mastectomies.  Patient reports she has been feeling well lately, no recent changes to her health.  She reports she feels well prepared for surgery and does not have any specific questions.  She denies any cardiac or pulmonary disease.  She had does not have any cardiac or pulmonary symptoms.   Past Medical History: Allergies: Allergies  Allergen Reactions   Penicillins Hives and Nausea And Vomiting   Tapentadol Hives and Other (See Comments)    Name brand is NUCYNTA    Lamictal [Lamotrigine] Rash   Codeine Hives   Oxycodone Hcl Hives    Current Medications:  Current Outpatient Medications:    acetaminophen (TYLENOL) 650 MG CR tablet, Take 1,300 mg by mouth every 8 (eight) hours as needed for pain (or headaches)., Disp: , Rfl:    lidocaine 4 %, Place 1 patch onto the skin daily as needed (for pain- affected area)., Disp: , Rfl:    linaclotide  (LINZESS) 145 MCG CAPS capsule, TAKE 1 CAPSULE BY MOUTH EVERY DAY BEFORE BREAKFAST - INSURANCE WILL PAY ON 6/25 (Patient taking differently: Take 145 mcg by mouth daily before breakfast.), Disp: 30 capsule, Rfl: 3   lithium carbonate (LITHOBID) 300 MG ER tablet, Take 1 tablet (300 mg total) by mouth 3 (three) times daily., Disp: 90 tablet, Rfl: 1   naproxen (NAPROSYN) 500 MG tablet, Take 1 tablet (500 mg total) by mouth 2 (two) times daily as needed., Disp: 60 tablet, Rfl: 1   ondansetron (ZOFRAN) 4 MG tablet, Take 1 tablet (4 mg total) by mouth every 8 (eight) hours as needed for nausea or vomiting., Disp: 20 tablet, Rfl: 0   pantoprazole (PROTONIX) 40 MG tablet, TAKE 1 TABLET BY MOUTH EVERY DAY, Disp: 90 tablet, Rfl: 1   promethazine (PHENERGAN) 12.5 MG tablet, Take 1 tablet (12.5 mg total) by mouth every 6 (six) hours as needed for nausea or vomiting., Disp: 30 tablet, Rfl: 0   QUEtiapine (SEROQUEL) 200 MG tablet, Take 1 tablet (200 mg total) by mouth at bedtime., Disp: 30 tablet, Rfl: 1   sertraline (ZOLOFT) 50 MG tablet, Take 1 tablet (50 mg total) by mouth daily., Disp: 30 tablet, Rfl: 1  Past Medical Problems: Past Medical History:  Diagnosis Date   Anemia    Anxiety    COVID-19    Depression  Family history of breast cancer 06/02/2018   Fibrocystic disease of both breasts    History of cervical dysplasia    laser surgery   Hyperglycemia 10/14/2020   Hypokalemia 12/03/2021   Insomnia    Left breast mass 06/30/2008   Low blood sugar 2001   pt checks BS in AM, controls with diet   Need for immunization against influenza 01/12/2022   Routine cervical smear 06/20/2020   Routine health maintenance 05/09/2014   Routine screening for STI (sexually transmitted infection) 01/12/2022   S/P mastectomy, bilateral 10/27/2018   Surgery follow-up 10/19/2018   UTI (urinary tract infection) 11/01/2020   Vaginal Pap smear, abnormal     Past Surgical History: Past Surgical History:   Procedure Laterality Date   AUGMENTATION MAMMAPLASTY Bilateral    BREAST FIBROADENOMA SURGERY  2011, 2008   benign    BREAST IMPLANT REMOVAL Bilateral 10/31/2022   Procedure: REMOVAL BREAST IMPLANTS;  Surgeon: Peggye Form, DO;  Location: [facility];  Service: Plastics;  Laterality: Bilateral;   BREAST RECONSTRUCTION WITH PLACEMENT OF TISSUE EXPANDER AND FLEX HD (ACELLULAR HYDRATED DERMIS) Bilateral 10/19/2018   Procedure: BREAST RECONSTRUCTION WITH PLACEMENT OF TISSUE EXPANDER AND FLEX HD (ACELLULAR HYDRATED DERMIS);  Surgeon: Peggye Form, DO;  Location: [facility];  Service: Plastics;  Laterality: Bilateral;   CESAREAN SECTION  04/22/1993   HYSTEROSCOPY WITH NOVASURE N/A 01/02/2016   Procedure: NOVASURE;  Surgeon: Adam Phenix, MD;  Location: [facility];  Service: Gynecology;  Laterality: N/A;   MASTECTOMY Bilateral 2019   NIPPLE SPARING MASTECTOMY Bilateral 10/19/2018   Procedure: BILATERAL NIPPLE SPARING MASTECTOMY;  Surgeon: Griselda Miner, MD;  Location: [facility];  Service: General;  Laterality: Bilateral;   REMOVAL OF BILATERAL TISSUE EXPANDERS WITH PLACEMENT OF BILATERAL BREAST IMPLANTS Bilateral 12/30/2018   Procedure: REMOVAL OF BILATERAL TISSUE EXPANDERS WITH PLACEMENT OF BILATERAL BREAST IMPLANTS;  Surgeon: Peggye Form, DO;  Location: [facility];  Service: Plastics;  Laterality: Bilateral;   TUBAL LIGATION  04/22/2001    Social History: Social History   Socioeconomic History   Marital status: Divorced    Spouse name: Not on file   Number of children: Not on file   Years of education: Not on file   Highest education level: Associate degree: occupational, Scientist, product/process development, or vocational program  Occupational History   Not on file  Tobacco Use   Smoking status: Never    Passive exposure: Never   Smokeless tobacco: Never  Vaping Use   Vaping status: Never Used  Substance and Sexual Activity   Alcohol use: No    Alcohol/week: 0.0  standard drinks of alcohol   Drug use: No   Sexual activity: Yes    Partners: Male    Birth control/protection: Surgical  Other Topics Concern   Not on file  Social History Narrative   Married, 3 daughters.  Works as a Lawyer at [employer] @ [employer]         Social Drivers of Corporate investment banker Strain: High Risk (02/14/2023)   Overall Financial Resource Strain (CARDIA)    Difficulty of Paying Living Expenses: Very hard  Food Insecurity: Food Insecurity Present (02/14/2023)   Hunger Vital Sign    Worried About Running Out of Food in the Last Year: Often true    Ran Out of Food in the Last Year: Often true  Transportation Needs: No Transportation Needs (02/14/2023)   PRAPARE - Transportation    Lack of  Transportation (Medical): No    Lack of Transportation (Non-Medical): No  Physical Activity: Insufficiently Active (02/14/2023)   Exercise Vital Sign    Days of Exercise per Week: 4 days    Minutes of Exercise per Session: 30 min  Stress: Stress Concern Present (02/14/2023)   [employer] of Occupational Health - Occupational Stress Questionnaire    Feeling of Stress : Rather much  Social Connections: Socially Isolated (02/14/2023)   Social Connection and Isolation Panel [NHANES]    Frequency of Communication with Friends and Family: Once a week    Frequency of Social Gatherings with Friends and Family: Never    Attends Religious Services: Never    Database administrator or Organizations: Yes    Attends Banker Meetings: Patient declined    Marital Status: Divorced  Catering manager Violence: Not At Risk (12/15/2022)   Humiliation, Afraid, Rape, and Kick questionnaire    Fear of Current or Ex-Partner: No    Emotionally Abused: No    Physically Abused: No    Sexually Abused: No    Family History: Family History  Problem Relation Age of Onset   Anxiety disorder Father    Depression Father    Diabetes Maternal Grandmother     Hypertension Paternal Grandfather    Cancer Maternal Aunt        breast   Breast cancer Maternal Aunt    Alcohol abuse Maternal Uncle    Alcohol abuse Cousin    Drug abuse Cousin    Stroke Neg Hx     Review of Systems: Review of Systems  Constitutional: Negative.   Respiratory: Negative.    Cardiovascular: Negative.   Gastrointestinal: Negative.   Neurological: Negative.     Physical Exam: Vital Signs BP 126/77 (BP Location: Left Arm, Patient Position: Sitting, Cuff Size: Small)   Pulse 95   SpO2 97%   Physical Exam Constitutional:      General: Not in acute distress.    Appearance: Normal appearance. Not ill-appearing.  HENT:     Head: Normocephalic and atraumatic.  Eyes:     Pupils: Pupils are equal, round Neck:     Musculoskeletal: Normal range of motion.  Cardiovascular:     Rate and Rhythm: Normal rate    Pulses: Normal pulses.  Pulmonary:     Effort: Pulmonary effort is normal. No respiratory distress.  Abdominal:     General: Abdomen is flat. There is no distension.  Musculoskeletal: Normal range of motion.  Skin:    General: Skin is warm and dry.     Findings: No erythema or rash.  Neurological:     General: No focal deficit present.     Mental Status: Alert and oriented to person, place, and time. Mental status is at baseline.     Motor: No weakness.  Psychiatric:        Mood and Affect: Mood normal.        Behavior: Behavior normal.    Assessment/Plan: The patient is scheduled for excision of bilateral breast scar contracture and lipo filling of bilateral breasts with Dr. Ulice Bold.  Risks, benefits, and alternatives of procedure discussed, questions answered and consent obtained.    Smoking Status: Non-smoker; Counseling Given?  N/A  Caprini Score: 6, high; Risk Factors include: Age, BMI > 25, history of breast cancer and length of planned surgery. Recommendation for mechanical prophylaxis. Encourage early ambulation.   Pictures obtained:  @[email or web address]   Post-op Rx sent to pharmacy: Oxycodone, Zofran, Keflex Patient reports  has tolerated Keflex in the past without any issues.  She does have a penicillin allergy. She reports she has tolerated oxycodone in the past as well, she takes Benadryl with this to avoid hives.  She has not had any other reactions to the oxycodone.  Patient was provided with the General Surgical Risk consent document and Pain Medication Agreement prior to their appointment.  They had adequate time to read through the risk consent documents and Pain Medication Agreement. We also discussed them in person together during this preop appointment. All of their questions were answered to their satisfaction.  Recommended calling if they have any further questions.  Risk consent form and Pain Medication Agreement to be scanned into patient's chart.  The risks that can be encountered with and after liposuction were discussed and include the following but no limited to these:  Asymmetry, fluid accumulation, firmness of the area, fat necrosis with death of fat tissue, bleeding, infection, delayed healing, anesthesia risks, skin sensation changes, injury to structures including nerves, blood vessels, and muscles which may be temporary or permanent, allergies to tape, suture materials and glues, blood products, topical preparations or injected agents, skin and contour irregularities, skin discoloration and swelling, deep vein thrombosis, cardiac and pulmonary complications, pain, which may persist, persistent pain, recurrence of the lesion, poor healing of the incision, possible need for revisional surgery or staged procedures. Thiere can also be persistent swelling, poor wound healing, rippling or loose skin, worsening of cellulite, swelling, and thermal burn or heat injury from ultrasound with the ultrasound-assisted lipoplasty technique. Any change in weight fluctuations can alter the outcome.   Electronically signed by: Kermit Balo  Zay Yeargan, PA-C 04/09/2023 9:33 AM

## 2023-04-10 ENCOUNTER — Ambulatory Visit: Payer: MEDICAID

## 2023-04-10 DIAGNOSIS — M6281 Muscle weakness (generalized): Secondary | ICD-10-CM

## 2023-04-10 DIAGNOSIS — G8929 Other chronic pain: Secondary | ICD-10-CM

## 2023-04-10 DIAGNOSIS — M25512 Pain in left shoulder: Secondary | ICD-10-CM | POA: Diagnosis not present
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
## 2023-04-18 ENCOUNTER — Ambulatory Visit: Payer: MEDICAID

## 2023-04-18 DIAGNOSIS — G8929 Other chronic pain: Secondary | ICD-10-CM

## 2023-04-18 DIAGNOSIS — M25512 Pain in left shoulder: Secondary | ICD-10-CM | POA: Diagnosis not present

## 2023-04-18 DIAGNOSIS — M6281 Muscle weakness (generalized): Secondary | ICD-10-CM
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
## 2023-04-22 ENCOUNTER — Ambulatory Visit: Payer: MEDICAID

## 2023-04-29 ENCOUNTER — Encounter (HOSPITAL_BASED_OUTPATIENT_CLINIC_OR_DEPARTMENT_OTHER): Payer: Self-pay | Admitting: Plastic Surgery

## 2023-04-29 ENCOUNTER — Other Ambulatory Visit: Payer: Self-pay

## 2023-04-30 ENCOUNTER — Other Ambulatory Visit (HOSPITAL_COMMUNITY): Payer: Self-pay | Admitting: Psychiatry

## 2023-04-30 ENCOUNTER — Other Ambulatory Visit: Payer: Self-pay | Admitting: Family Medicine

## 2023-04-30 DIAGNOSIS — G4700 Insomnia, unspecified: Secondary | ICD-10-CM

## 2023-04-30 DIAGNOSIS — F4310 Post-traumatic stress disorder, unspecified: Secondary | ICD-10-CM

## 2023-04-30 DIAGNOSIS — K5900 Constipation, unspecified: Secondary | ICD-10-CM

## 2023-04-30 DIAGNOSIS — F3160 Bipolar disorder, current episode mixed, unspecified: Secondary | ICD-10-CM

## 2023-05-05 ENCOUNTER — Encounter (HOSPITAL_COMMUNITY): Payer: Self-pay | Admitting: Clinical

## 2023-05-05 ENCOUNTER — Ambulatory Visit (HOSPITAL_COMMUNITY): Payer: MEDICAID | Admitting: Clinical

## 2023-05-05 DIAGNOSIS — F4310 Post-traumatic stress disorder, unspecified: Secondary | ICD-10-CM

## 2023-05-05 DIAGNOSIS — F319 Bipolar disorder, unspecified: Secondary | ICD-10-CM | POA: Diagnosis not present

## 2023-05-05 DIAGNOSIS — F410 Panic disorder [episodic paroxysmal anxiety] without agoraphobia: Secondary | ICD-10-CM

## 2023-05-05 DIAGNOSIS — F411 Generalized anxiety disorder: Secondary | ICD-10-CM | POA: Diagnosis not present
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
## 2023-05-06 ENCOUNTER — Other Ambulatory Visit (HOSPITAL_COMMUNITY): Payer: Self-pay | Admitting: Psychiatry

## 2023-05-06 ENCOUNTER — Encounter (HOSPITAL_COMMUNITY): Payer: Self-pay | Admitting: Psychiatry

## 2023-05-06 ENCOUNTER — Telehealth (HOSPITAL_COMMUNITY): Payer: MEDICAID | Admitting: Psychiatry

## 2023-05-06 DIAGNOSIS — G4700 Insomnia, unspecified: Secondary | ICD-10-CM

## 2023-05-06 DIAGNOSIS — F4310 Post-traumatic stress disorder, unspecified: Secondary | ICD-10-CM

## 2023-05-06 DIAGNOSIS — F3160 Bipolar disorder, current episode mixed, unspecified: Secondary | ICD-10-CM | POA: Diagnosis not present

## 2023-05-06 MED ORDER — LITHIUM CARBONATE ER 450 MG PO TBCR: 450.0000 mg | EXTENDED_RELEASE_TABLET | Freq: Three times a day (TID) | ORAL | 1 refills | Status: DC

## 2023-05-06 MED ORDER — SERTRALINE HCL 50 MG PO TABS: 50.0000 mg | ORAL_TABLET | Freq: Every day | ORAL | 1 refills | Status: DC

## 2023-05-06 MED ORDER — SERTRALINE HCL 50 MG PO TABS: 75.0000 mg | ORAL_TABLET | Freq: Every day | ORAL | 1 refills | Status: DC

## 2023-05-06 MED ORDER — QUETIAPINE FUMARATE 200 MG PO TABS: 200.0000 mg | ORAL_TABLET | Freq: Every day | ORAL | 1 refills | Status: DC

## 2023-05-06 NOTE — Progress Notes (Signed)
 [facility] MD Virtual Progress Note   Patient Location: Home Provider Location: Home Office  I connect with patient by video and verified that I am speaking with correct person by using two identifiers. I discussed the limitations of evaluation and management by telemedicine and the availability of in person appointments. I also discussed with the patient that there may be a patient responsible charge related to this service. The patient expressed understanding and agreed to proceed.  Bethany [name] [id number] 48 y.o.  05/06/2023 10:09 AM  History of Present Illness:  Patient is evaluated by video session.  She reported some anxiety and decided to have increased hallucination.  She also noticed irritability, anger, mood swings.  She started seeing therapist.  She is also having procedure tomorrow for for breast reduction and liposuction.  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  Patient told it is causing a lot of pain in her left shoulder and neck.  She is not driving but physically she is getting better.  She had a good Christmas.  She was not able to see her oldest daughter who lives in [place]  but had a good time with 2 other daughters.  She denies any suicidal thoughts or homicidal thoughts.  She feels medicine working but like to increase the dose.  Her last hemoglobin A1c was 6.  Her lithium  level was low.  She is taking lithium  300 mg 3 times a day.  She has nightmares and flashback.  She also getting therapy through the church.    Past Psychiatric History: Saw therapist in [facility] after brother killed.  Took Zoloft  and lorazepam  by PCP.  She took Lamictal  but stopped after the rash.  Trazodone  helped but wanted to try something for anxiety and it was switched to hydroxyzine .  No h/o inpatient treatment, suicidal attempt, psychosis or any mania.  H/O irritability, anger and mood swing.  Tried  Lamictal  but stopped due to rash.      Outpatient Encounter Medications as of 05/06/2023  Medication Sig   acetaminophen  (TYLENOL ) 650 MG CR tablet Take 1,300 mg by mouth every 8 (eight) hours as needed for pain (or headaches).   lidocaine  4 % Place 1 patch onto the skin daily as needed (for pain- affected area).   linaclotide  (LINZESS ) 145 MCG CAPS capsule TAKE 1 CAPSULE BY MOUTH EVERY DAY BEFORE BREAKFAST - INSURANCE WILL PAY ON 6/25   lithium  carbonate (LITHOBID ) 300 MG ER tablet Take 1 tablet (300 mg total) by mouth 3 (three) times daily.   naproxen  (NAPROSYN ) 500 MG tablet Take 1 tablet (500 mg total) by mouth 2 (two) times daily as needed.   ondansetron  (ZOFRAN ) 4 MG tablet Take 1 tablet (4 mg total) by mouth every 8 (eight) hours as needed for nausea or vomiting.   pantoprazole  (PROTONIX ) 40 MG tablet TAKE 1 TABLET BY MOUTH EVERY DAY   promethazine  (PHENERGAN ) 12.5 MG tablet Take 1 tablet (12.5 mg total) by mouth every 6 (six) hours as needed for nausea or vomiting.   QUEtiapine  (SEROQUEL ) 200 MG tablet Take 1 tablet (200 mg total) by mouth at bedtime.   sertraline  (ZOLOFT ) 50 MG tablet Take 1 tablet (50 mg total) by mouth daily.   No facility-administered encounter medications on file as of 05/06/2023.    Recent Results (from the past 2160 hours)  HgB A1c     Status: Abnormal   Collection  Time: 02/18/23  3:25 PM  Result Value Ref Range   Hemoglobin A1C 6.0 (A) 4.0 - 5.6 %   HbA1c POC (<> result, manual entry)     HbA1c, POC (prediabetic range)     HbA1c, POC (controlled diabetic range)    Potassium     Status: None   Collection Time: 02/18/23  3:27 PM  Result Value Ref Range   Potassium 4.4 3.5 - 5.2 mmol/L     Psychiatric Specialty Exam: Physical Exam  Review of Systems  Musculoskeletal:  Positive for neck pain.  Psychiatric/Behavioral:  Positive for dysphoric mood, hallucinations and sleep disturbance. The patient is nervous/anxious.     Weight 168 lb (76.2 kg).There  is no height or weight on file to calculate BMI.  General Appearance: Casual  Eye Contact:  Fair  Speech:  Slow  Volume:  Decreased  Mood:  Anxious and Irritable  Affect:  Labile  Thought Process:  Descriptions of Associations: Intact  Orientation:  Full (Time, Place, and Person)  Thought Content:  Hallucinations: Auditory Visual, Paranoid Ideation, and Rumination  Suicidal Thoughts:  No  Homicidal Thoughts:  No  Memory:  Immediate;   Fair Recent;   Fair Remote;   Good  Judgement:  Intact  Insight:  Present  Psychomotor Activity:  Decreased  Concentration:  Concentration: Fair and Attention Span: Fair  Recall:  [employer] of Knowledge:  Fair  Language:  Good  Akathisia:  No  Handed:  Right  AIMS (if indicated):     Assets:  Communication Skills Desire for Improvement Housing Social Support  ADL's:  Intact  Cognition:  WNL  Sleep:  fair     Assessment/Plan: Mixed bipolar I disorder (HCC) - Plan: lithium  carbonate (ESKALITH ) 450 MG ER tablet, QUEtiapine  (SEROQUEL ) 200 MG tablet, sertraline  (ZOLOFT ) 50 MG tablet  Insomnia, unspecified type - Plan: QUEtiapine  (SEROQUEL ) 200 MG tablet, sertraline  (ZOLOFT ) 50 MG tablet  Post-traumatic stress disorder, unspecified - Plan: sertraline  (ZOLOFT ) 50 MG tablet  Discussed residual symptoms.  She has having anxiety, hallucination and paranoia.  Sometimes she has auditory and visual hallucination but people talking to her and she see things which are not there.  Therapy has been helpful but she is struggling with sleep with nightmares and these hallucinations.  I reviewed blood work results.  Last lithium  level was low, BUN 40 and creatinine 0.92 in September.  Her hemoglobin A1c 6.  I discussed we can try optimizing the lithium .  Currently she is taking 900 mg a day and we will increase to 450 mg 3 times a day.  I also increase Zoloft  75 mg daily.  I recommend to keep the current dose of Seroquel  as she is trying to lose weight and hoping  to have a procedure tomorrow for breast reduction and like resection.  She agreed with the plan.  We discussed in case she feel worsening of symptoms then call us  back.  We will follow-up in 2 months.   Follow Up Instructions:     I discussed the assessment and treatment plan with the patient. The patient was provided an opportunity to ask questions and all were answered. The patient agreed with the plan and demonstrated an understanding of the instructions.   The patient was advised to call back or seek an in-person evaluation if the symptoms worsen or if the condition fails to improve as anticipated.    Collaboration of Care: Other provider involved in patient's care AEB notes are available in epic to  review.  Patient/Guardian was advised Release of Information must be obtained prior to any record release in order to collaborate their care with an outside provider. Patient/Guardian was advised if they have not already done so to contact the registration department to sign all necessary forms in order for us  to release information regarding their care.   Consent: Patient/Guardian gives verbal consent for treatment and assignment of benefits for services provided during this visit. Patient/Guardian expressed understanding and agreed to proceed.     I provided 27 minutes of non face to face time during this encounter.  Note: This document was prepared by [employer] voice dictation technology and any errors that results from this process are unintentional.    Leni ONEIDA Client, MD 05/06/2023

## 2023-05-07 ENCOUNTER — Other Ambulatory Visit: Payer: Self-pay

## 2023-05-07 ENCOUNTER — Ambulatory Visit (HOSPITAL_BASED_OUTPATIENT_CLINIC_OR_DEPARTMENT_OTHER)
Admission: RE | Admit: 2023-05-07 | Discharge: 2023-05-07 | Disposition: A | Discharge status: Home / Self Care | Payer: MEDICAID | Attending: Plastic Surgery | Admitting: Plastic Surgery

## 2023-05-07 ENCOUNTER — Ambulatory Visit (HOSPITAL_BASED_OUTPATIENT_CLINIC_OR_DEPARTMENT_OTHER): Payer: MEDICAID | Admitting: Anesthesiology

## 2023-05-07 ENCOUNTER — Encounter (HOSPITAL_BASED_OUTPATIENT_CLINIC_OR_DEPARTMENT_OTHER): Payer: Self-pay | Admitting: Plastic Surgery

## 2023-05-07 ENCOUNTER — Encounter (HOSPITAL_BASED_OUTPATIENT_CLINIC_OR_DEPARTMENT_OTHER)
Admission: RE | Disposition: A | Discharge status: Home / Self Care | Payer: Self-pay | Source: Home / Self Care | Attending: Plastic Surgery

## 2023-05-07 DIAGNOSIS — Z9013 Acquired absence of bilateral breasts and nipples: Secondary | ICD-10-CM | POA: Insufficient documentation

## 2023-05-07 DIAGNOSIS — Z803 Family history of malignant neoplasm of breast: Secondary | ICD-10-CM | POA: Diagnosis not present

## 2023-05-07 DIAGNOSIS — N651 Disproportion of reconstructed breast: Secondary | ICD-10-CM

## 2023-05-07 DIAGNOSIS — Z853 Personal history of malignant neoplasm of breast: Secondary | ICD-10-CM | POA: Diagnosis not present

## 2023-05-07 DIAGNOSIS — Z01818 Encounter for other preprocedural examination: Secondary | ICD-10-CM

## 2023-05-07 DIAGNOSIS — N6489 Other specified disorders of breast: Secondary | ICD-10-CM | POA: Diagnosis present

## 2023-05-07 HISTORY — PX: LIPOSUCTION WITH LIPOFILLING: SHX6436

## 2023-05-07 LAB — POCT PREGNANCY, URINE: Preg Test, Ur: NEGATIVE

## 2023-05-07 SURGERY — LIPOSUCTION, WITH FAT TRANSFER
Anesthesia: General | Site: Breast | Laterality: Bilateral

## 2023-05-07 MED ORDER — ATROPINE SULFATE 0.4 MG/ML IV SOLN
INTRAVENOUS | Status: AC
  Filled 2023-05-07: qty 1

## 2023-05-07 MED ORDER — PHENYLEPHRINE 80 MCG/ML (10ML) SYRINGE FOR IV PUSH (FOR BLOOD PRESSURE SUPPORT)
PREFILLED_SYRINGE | INTRAVENOUS | Status: AC
  Filled 2023-05-07: qty 10

## 2023-05-07 MED ORDER — LIDOCAINE 2% (20 MG/ML) 5 ML SYRINGE
INTRAMUSCULAR | Status: DC | PRN
  Administered 2023-05-07: 60 mg via INTRAVENOUS

## 2023-05-07 MED ORDER — MIDAZOLAM HCL 5 MG/5ML IJ SOLN
INTRAMUSCULAR | Status: DC | PRN
  Administered 2023-05-07: 2 mg via INTRAVENOUS

## 2023-05-07 MED ORDER — ONDANSETRON HCL 4 MG/2ML IJ SOLN
INTRAMUSCULAR | Status: AC
  Filled 2023-05-07: qty 2

## 2023-05-07 MED ORDER — LACTATED RINGERS IV SOLN
INTRAVENOUS | Status: DC
Start: 2023-05-07 — End: 2023-05-07

## 2023-05-07 MED ORDER — KETOROLAC TROMETHAMINE 30 MG/ML IJ SOLN
INTRAMUSCULAR | Status: AC
  Filled 2023-05-07: qty 1

## 2023-05-07 MED ORDER — MIDAZOLAM HCL 2 MG/2ML IJ SOLN
INTRAMUSCULAR | Status: AC
  Filled 2023-05-07: qty 2

## 2023-05-07 MED ORDER — CIPROFLOXACIN IN D5W 400 MG/200ML IV SOLN
400.0000 mg | INTRAVENOUS | Status: AC
  Administered 2023-05-07: 400 mg via INTRAVENOUS

## 2023-05-07 MED ORDER — CHLORHEXIDINE GLUCONATE CLOTH 2 % EX PADS: 6.0000 | MEDICATED_PAD | Freq: Once | CUTANEOUS | Status: DC

## 2023-05-07 MED ORDER — ACETAMINOPHEN 10 MG/ML IV SOLN
1000.0000 mg | Freq: Once | INTRAVENOUS | Status: DC | PRN
Start: 2023-05-07 — End: 2023-05-07

## 2023-05-07 MED ORDER — OXYCODONE HCL 5 MG PO TABS
ORAL_TABLET | ORAL | Status: AC
  Filled 2023-05-07: qty 1

## 2023-05-07 MED ORDER — KETOROLAC TROMETHAMINE 30 MG/ML IJ SOLN
30.0000 mg | Freq: Once | INTRAMUSCULAR | Status: AC | PRN
  Administered 2023-05-07: 30 mg via INTRAVENOUS

## 2023-05-07 MED ORDER — SODIUM CHLORIDE 0.9 % IV SOLN: INTRAVENOUS | Status: DC | PRN

## 2023-05-07 MED ORDER — FENTANYL CITRATE (PF) 100 MCG/2ML IJ SOLN
INTRAMUSCULAR | Status: AC
  Filled 2023-05-07: qty 2

## 2023-05-07 MED ORDER — 0.9 % SODIUM CHLORIDE (POUR BTL) OPTIME
TOPICAL | Status: DC | PRN
  Administered 2023-05-07: 1000 mL

## 2023-05-07 MED ORDER — DEXAMETHASONE SODIUM PHOSPHATE 4 MG/ML IJ SOLN
INTRAMUSCULAR | Status: DC | PRN
  Administered 2023-05-07: 5 mg via INTRAVENOUS

## 2023-05-07 MED ORDER — FENTANYL CITRATE (PF) 100 MCG/2ML IJ SOLN
25.0000 ug | INTRAMUSCULAR | Status: DC | PRN
  Administered 2023-05-07 (×2): 50 ug via INTRAVENOUS

## 2023-05-07 MED ORDER — PROPOFOL 10 MG/ML IV BOLUS
INTRAVENOUS | Status: DC | PRN
  Administered 2023-05-07: 200 mg via INTRAVENOUS

## 2023-05-07 MED ORDER — AMISULPRIDE (ANTIEMETIC) 5 MG/2ML IV SOLN: 10.0000 mg | Freq: Once | INTRAVENOUS | Status: DC | PRN

## 2023-05-07 MED ORDER — EPHEDRINE 5 MG/ML INJ
INTRAVENOUS | Status: AC
Start: 2023-05-07 — End: ?
  Filled 2023-05-07: qty 5

## 2023-05-07 MED ORDER — LIDOCAINE-EPINEPHRINE 1 %-1:100000 IJ SOLN
INTRAMUSCULAR | Status: DC | PRN
  Administered 2023-05-07: 10 mL

## 2023-05-07 MED ORDER — PROPOFOL 10 MG/ML IV BOLUS
INTRAVENOUS | Status: AC
  Filled 2023-05-07: qty 20

## 2023-05-07 MED ORDER — PHENYLEPHRINE HCL (PRESSORS) 10 MG/ML IV SOLN
INTRAVENOUS | Status: DC | PRN
  Administered 2023-05-07: 80 ug via INTRAVENOUS

## 2023-05-07 MED ORDER — FENTANYL CITRATE (PF) 100 MCG/2ML IJ SOLN
INTRAMUSCULAR | Status: DC | PRN
  Administered 2023-05-07 (×2): 25 ug via INTRAVENOUS
  Administered 2023-05-07: 100 ug via INTRAVENOUS
  Administered 2023-05-07: 50 ug via INTRAVENOUS

## 2023-05-07 MED ORDER — SODIUM CHLORIDE (PF) 0.9 % IJ SOLN
INTRAMUSCULAR | Status: DC | PRN
  Administered 2023-05-07: 1000 mL

## 2023-05-07 MED ORDER — PHENYLEPHRINE 80 MCG/ML (10ML) SYRINGE FOR IV PUSH (FOR BLOOD PRESSURE SUPPORT)
PREFILLED_SYRINGE | INTRAVENOUS | Status: AC
  Filled 2023-05-07: qty 20

## 2023-05-07 MED ORDER — LIDOCAINE 2% (20 MG/ML) 5 ML SYRINGE
INTRAMUSCULAR | Status: AC
  Filled 2023-05-07: qty 5

## 2023-05-07 MED ORDER — CIPROFLOXACIN IN D5W 400 MG/200ML IV SOLN
INTRAVENOUS | Status: AC
  Filled 2023-05-07: qty 200

## 2023-05-07 MED ORDER — OXYCODONE HCL 5 MG PO TABS
5.0000 mg | ORAL_TABLET | Freq: Once | ORAL | Status: DC | PRN
Start: 2023-05-07 — End: 2023-05-07

## 2023-05-07 MED ORDER — ONDANSETRON HCL 4 MG/2ML IJ SOLN
INTRAMUSCULAR | Status: DC | PRN
  Administered 2023-05-07: 4 mg via INTRAVENOUS

## 2023-05-07 MED ORDER — ACETAMINOPHEN 500 MG PO TABS: 1000.0000 mg | ORAL_TABLET | Freq: Once | ORAL | Status: DC

## 2023-05-07 MED ORDER — LACTATED RINGERS IV SOLN
INTRAVENOUS | Status: AC | PRN
  Administered 2023-05-07 (×2): 1

## 2023-05-07 SURGICAL SUPPLY — 45 items
BINDER ABDOMINAL 10 UNV 27-48 (MISCELLANEOUS) IMPLANT
BINDER ABDOMINAL 12 SM 30-45 (SOFTGOODS) IMPLANT
BINDER ABDOMINAL 9 SM 30-45 (SOFTGOODS) IMPLANT
BINDER BREAST LRG (GAUZE/BANDAGES/DRESSINGS) IMPLANT
BINDER BREAST MEDIUM (GAUZE/BANDAGES/DRESSINGS) IMPLANT
BINDER BREAST XLRG (GAUZE/BANDAGES/DRESSINGS) IMPLANT
BINDER BREAST XXLRG (GAUZE/BANDAGES/DRESSINGS) IMPLANT
BLADE HEX COATED 2.75 (ELECTRODE) IMPLANT
BLADE SURG 15 STRL LF DISP TIS (BLADE) ×1 IMPLANT
COVER BACK TABLE 60X90IN (DRAPES) ×1 IMPLANT
COVER MAYO STAND STRL (DRAPES) ×1 IMPLANT
DERMABOND ADVANCED .7 DNX12 (GAUZE/BANDAGES/DRESSINGS) ×1 IMPLANT
DRAPE LAPAROSCOPIC ABDOMINAL (DRAPES) ×1 IMPLANT
DRESSING MEPILEX FLEX 4X4 (GAUZE/BANDAGES/DRESSINGS) IMPLANT
DRSG MEPILEX FLEX 4X4 (GAUZE/BANDAGES/DRESSINGS) ×2
ELECT REM PT RETURN 9FT ADLT (ELECTROSURGICAL) ×1
ELECTRODE REM PT RTRN 9FT ADLT (ELECTROSURGICAL) ×1 IMPLANT
EXTRACTOR CANIST REVOLVE STRL (CANNISTER) ×1 IMPLANT
GAUZE PAD ABD 8X10 STRL (GAUZE/BANDAGES/DRESSINGS) ×2 IMPLANT
GLOVE BIO SURGEON STRL SZ 6.5 (GLOVE) ×3 IMPLANT
GOWN STRL REUS W/ TWL LRG LVL3 (GOWN DISPOSABLE) ×2 IMPLANT
IV LACTATED RINGERS 1000ML (IV SOLUTION) ×2 IMPLANT
LINER CANISTER 1000CC FLEX (MISCELLANEOUS) ×1 IMPLANT
NDL HYPO 25X1 1.5 SAFETY (NEEDLE) ×1 IMPLANT
NEEDLE HYPO 25X1 1.5 SAFETY (NEEDLE) ×1
NS IRRIG 1000ML POUR BTL (IV SOLUTION) IMPLANT
PACK BASIN DAY SURGERY FS (CUSTOM PROCEDURE TRAY) ×1 IMPLANT
PAD ALCOHOL SWAB (MISCELLANEOUS) ×1 IMPLANT
PENCIL SMOKE EVACUATOR (MISCELLANEOUS) IMPLANT
SLEEVE SCD COMPRESS KNEE MED (STOCKING) ×1 IMPLANT
SPIKE FLUID TRANSFER (MISCELLANEOUS) IMPLANT
SPONGE T-LAP 18X18 ~~LOC~~+RFID (SPONGE) ×1 IMPLANT
STRIP SUTURE WOUND CLOSURE 1/2 (MISCELLANEOUS) IMPLANT
SUT MNCRL AB 4-0 PS2 18 (SUTURE) IMPLANT
SUT MON AB 5-0 PS2 18 (SUTURE) ×2 IMPLANT
SYR 10ML LL (SYRINGE) ×5 IMPLANT
SYR 3ML 18GX1 1/2 (SYRINGE) IMPLANT
SYR 50ML LL SCALE MARK (SYRINGE) ×1 IMPLANT
SYR CONTROL 10ML LL (SYRINGE) ×1 IMPLANT
SYR TOOMEY 50ML (SYRINGE) IMPLANT
TOWEL GREEN STERILE FF (TOWEL DISPOSABLE) ×2 IMPLANT
TRAY DSU PREP LF (CUSTOM PROCEDURE TRAY) ×1 IMPLANT
TUBING INFILTRATION IT-10001 (TUBING) ×1 IMPLANT
TUBING SET GRADUATE ASPIR 12FT (MISCELLANEOUS) ×1 IMPLANT
UNDERPAD 30X36 HEAVY ABSORB (UNDERPADS AND DIAPERS) ×2 IMPLANT

## 2023-05-07 NOTE — Interval H&P Note (Signed)
 History and Physical Interval Note:  05/07/2023 12:03 PM  Bethany [name]  has presented today for surgery, with the diagnosis of hx of breast cancer.  The various methods of treatment have been discussed with the patient and family. After consideration of risks, benefits and other options for treatment, the patient has consented to  Procedure(s): Excision of bilateral breast scar contracture and lipo filling bilateral breasts (Bilateral) as a surgical intervention.  The patient's history has been reviewed, patient examined, no change in status, stable for surgery.  I have reviewed the patient's chart and labs.  Questions were answered to the patient's satisfaction.     Lindaann Requena Willys Salvino

## 2023-05-07 NOTE — Transfer of Care (Signed)
 Immediate Anesthesia Transfer of Care Note  Patient: Bethany [name]  Procedure(s) Performed: Excision of bilateral breast scar contracture and lipo filling bilateral breasts (Bilateral: Breast)  Patient Location: PACU  Anesthesia Type:General  Level of Consciousness: drowsy and patient cooperative  Airway & Oxygen Therapy: Patient Spontanous Breathing and Patient connected to face mask oxygen  Post-op Assessment: Report given to RN and Post -op Vital signs reviewed and stable  Post vital signs: Reviewed and stable  Last Vitals:  Vitals Value Taken Time  BP 130/84 05/07/23 1419  Temp    Pulse 103 05/07/23 1420  Resp 14 05/07/23 1420  SpO2 100 % 05/07/23 1420  Vitals shown include unfiled device data.  Last Pain:  Vitals:   05/07/23 1133  PainSc: 0-No pain      Patients Stated Pain Goal: 5 (05/07/23 1133)  Complications: No notable events documented.

## 2023-05-07 NOTE — Anesthesia Procedure Notes (Signed)
 Procedure Name: LMA Insertion Date/Time: 05/07/2023 12:51 PM  Performed by: Eugenia Hess, CRNAPre-anesthesia Checklist: Patient identified, Emergency Drugs available, Suction available and Patient being monitored Patient Re-evaluated:Patient Re-evaluated prior to induction Oxygen Delivery Method: Circle System Utilized Preoxygenation: Pre-oxygenation with 100% oxygen Induction Type: IV induction Ventilation: Mask ventilation without difficulty LMA: LMA inserted LMA Size: 4.0 Number of attempts: 2 Airway Equipment and Method: bite block Placement Confirmation: positive ETCO2 Tube secured with: Tape Dental Injury: Teeth and Oropharynx as per pre-operative assessment

## 2023-05-07 NOTE — Discharge Instructions (Addendum)
 INSTRUCTIONS FOR AFTER BREAST SURGERY   You will likely have some questions about what to expect following your operation.  The following information will help you and your family understand what to expect when you are discharged from the hospital.  It is important to follow these guidelines to help ensure a smooth recovery and reduce complication.  Postoperative instructions include information on: diet, wound care, medications and physical activity.  AFTER SURGERY Expect to go home after the procedure.  In some cases, you may need to spend one night in the hospital for observation.  DIET Breast surgery does not require a specific diet.  However, the healthier you eat the better your body will heal. It is important to increasing your protein intake.  This means limiting the foods with sugar and carbohydrates.  Focus on vegetables and some meat.  If you have liposuction during your procedure be sure to drink water.  If your urine is bright yellow, then it is concentrated, and you need to drink more water.  As a general rule after surgery, you should have 8 ounces of water every hour while awake.  If you find you are persistently nauseated or unable to take in liquids let us  know.  NO TOBACCO USE or EXPOSURE.  This will slow your healing process and lead to a wound.  WOUND CARE Leave the binder on for 3 days . Use fragrance free soap like Dial, Dove or [place].   After 3 days you can remove the binder to shower. Once dry apply binder or sports bra. If you have liposuction you will have a soft and spongy dressing (Lipofoam) that helps prevent creases in your skin.  Remove before you shower and then replace it.  It is also available on [employer]. If you have steri-strips / tape directly attached to your skin leave them in place. It is OK to get these wet.   No baths, pools or hot tubs for four weeks. We close your incision to leave the smallest and best-looking scar. No ointment or creams on your incisions  for four weeks.  No Neosporin (Too many skin reactions).  A few weeks after surgery you can use Mederma and start massaging the scar. We ask you to wear your binder or sports bra for the first 6 weeks around the clock, including while sleeping. This provides added comfort and helps reduce the fluid accumulation at the surgery site. NO Ice or heating pads to the operative site.  You have a very high risk of a BURN before you feel the temperature change.  ACTIVITY No heavy lifting until cleared by the doctor.  This usually means no more than a half-gallon of milk.  It is OK to walk and climb stairs. Moving your legs is very important to decrease your risk of a blood clot.  It will also help keep you from getting deconditioned.  Every 1 to 2 hours get up and walk for 5 minutes. This will help with a quicker recovery back to normal.  Let pain be your guide so you don't do too much.  This time is for you to recover.  You will be more comfortable if you sleep and rest with your head elevated either with a few pillows under you or in a recliner.  No stomach sleeping for a three months.  WORK Everyone returns to work at different times. As a rough guide, most people take at least 1 - 2 weeks off prior to returning to work. If  you need documentation for your job, give the forms to the front staff at the clinic.  DRIVING Arrange for someone to bring you home from the hospital after your surgery.  You may be able to drive a few days after surgery but not while taking any narcotics or valium .  BOWEL MOVEMENTS Constipation can occur after anesthesia and while taking pain medication.  It is important to stay ahead for your comfort.  We recommend taking Milk of Magnesia (2 tablespoons; twice a day) while taking the pain pills.  MEDICATIONS You may be prescribed should start after surgery At your preoperative visit for you history and physical you may have been given the following medications: An antibiotic: Start  this medication when you get home and take according to the instructions on the bottle. Zofran  4 mg:  This is to treat nausea and vomiting.  You can take this every 6 hours as needed and only if needed. Valium  2 mg for breast cancer patients: This is for muscle tightness if you have an implant or expander. This will help relax your muscle which also helps with pain control.  This can be taken every 12 hours as needed. Don't drive after taking this medication. Norco (hydrocodone /acetaminophen ) 5/325 mg:  This is only to be used after you have taken the Motrin  or the Tylenol . Every 8 hours as needed.   Over the counter Medication to take: Ibuprofen  (Motrin ) 600 mg:  Take this every 6 hours.  If you have additional pain then take 500 mg of the Tylenol  every 8 hours.  Only take the Norco after you have tried these two. MiraLAX or Milk of Magnesia: Take this according to the bottle if you take the Norco.  WHEN TO CALL Call your surgeon's office if any of the following occur: Fever 101 degrees F or greater Excessive bleeding or fluid from the incision site. Pain that increases over time without aid from the medications Redness, warmth, or pus draining from incision sites Persistent nausea or inability to take in liquids Severe misshapen area that underwent the operation.  Post Anesthesia Home Care Instructions  Activity: Get plenty of rest for the remainder of the day. A responsible individual must stay with you for 24 hours following the procedure.  For the next 24 hours, DO NOT: -Drive a car -Advertising copywriter -Drink alcoholic beverages -Take any medication unless instructed by your physician -Make any legal decisions or sign important papers.  Meals: Start with liquid foods such as gelatin or soup. Progress to regular foods as tolerated. Avoid greasy, spicy, heavy foods. If nausea and/or vomiting occur, drink only clear liquids until the nausea and/or vomiting subsides. Call your  physician if vomiting continues.  Special Instructions/Symptoms: Your throat may feel dry or sore from the anesthesia or the breathing tube placed in your throat during surgery. If this causes discomfort, gargle with warm salt water. The discomfort should disappear within 24 hours.  If you had a scopolamine  patch placed behind your ear for the management of post- operative nausea and/or vomiting:  1. The medication in the patch is effective for 72 hours, after which it should be removed.  Wrap patch in a tissue and discard in the trash. Wash hands thoroughly with soap and water. 2. You may remove the patch earlier than 72 hours if you experience unpleasant side effects which may include dry mouth, dizziness or visual disturbances. 3. Avoid touching the patch. Wash your hands with soap and water after contact with the patch.  Next dose of ibuprofen  may be taken at 8:30p

## 2023-05-07 NOTE — Anesthesia Preprocedure Evaluation (Addendum)
 Anesthesia Evaluation  Patient identified by MRN, date of birth, ID band Patient awake    Reviewed: Allergy & Precautions, NPO status , Patient's Chart, lab work & pertinent test results  Airway Mallampati: II  TM Distance: >3 FB Neck ROM: Full    Dental no notable dental hx.    Pulmonary neg pulmonary ROS   Pulmonary exam normal        Cardiovascular negative cardio ROS Normal cardiovascular exam     Neuro/Psych  Headaches PSYCHIATRIC DISORDERS Anxiety Depression Bipolar Disorder    Neuromuscular disease    GI/Hepatic negative GI ROS, Neg liver ROS,,,  Endo/Other  negative endocrine ROS    Renal/GU negative Renal ROS     Musculoskeletal  (+) Arthritis ,    Abdominal   Peds  Hematology negative hematology ROS (+)   Anesthesia Other Findings hx of breast cancer  Reproductive/Obstetrics Hcg negative                             Anesthesia Physical Anesthesia Plan  ASA: 2  Anesthesia Plan: General   Post-op Pain Management:    Induction: Intravenous  PONV Risk Score and Plan: 3 and Ondansetron , Dexamethasone , Midazolam  and Treatment may vary due to age or medical condition  Airway Management Planned: LMA  Additional Equipment:   Intra-op Plan:   Post-operative Plan: Extubation in OR  Informed Consent: I have reviewed the patients History and Physical, chart, labs and discussed the procedure including the risks, benefits and alternatives for the proposed anesthesia with the patient or authorized representative who has indicated his/her understanding and acceptance.     Dental advisory given  Plan Discussed with: CRNA  Anesthesia Plan Comments:        Anesthesia Quick Evaluation

## 2023-05-07 NOTE — Anesthesia Postprocedure Evaluation (Signed)
 Anesthesia Post Note  Patient: Bethany [name]  Procedure(s) Performed: Excision of bilateral breast scar contracture and lipo filling bilateral breasts (Bilateral: Breast)     Patient location during evaluation: PACU Anesthesia Type: General Level of consciousness: awake Pain management: pain level controlled Vital Signs Assessment: post-procedure vital signs reviewed and stable Respiratory status: spontaneous breathing, nonlabored ventilation and respiratory function stable Cardiovascular status: blood pressure returned to baseline and stable Postop Assessment: no apparent nausea or vomiting Anesthetic complications: no   No notable events documented.  Last Vitals:  Vitals:   05/07/23 1500 05/07/23 1515  BP: 114/70 121/80  Pulse: 79 100  Resp: 14 17  Temp:    SpO2: (!) 87% 97%    Last Pain:  Vitals:   05/07/23 1515  PainSc: 3                  Aleira Deiter P Yuli Lanigan

## 2023-05-07 NOTE — Op Note (Signed)
 DATE OF OPERATION: 05/07/2023  LOCATION: [facility]  PREOPERATIVE DIAGNOSIS: Bilateral breast asymmetry after mastectomies  POSTOPERATIVE DIAGNOSIS: Same  PROCEDURE: Bilateral fat grafting to breast (Right 90 cc, Left 100 cc)  Capsulotomy of bilateral breast capsule 100 cm2 bilateral Liposuction to lateral breast bilaterally for symmetry  SURGEON: Gilles Lacks, DO  ASSISTANT: Perrin Brakeman, PA  EBL: nonec  CONDITION: Stable  COMPLICATIONS: None  INDICATION: The patient, Bethany [name], is a 48 y.o. female born on October 20, 1975, is here for treatment of breast asymmetry after treatment for breast cancer with bilateral mastectomies.Aaron Aas   PROCEDURE DETAILS:  The patient was seen prior to surgery and marked.  The IV antibiotics were given. The patient was taken to the operating room and given a general anesthetic. A standard time out was performed and all information was confirmed by those in the room. SCDs were placed.   The chest and abdomen were prepped and draped.  Local was placed at the inferior lateral abdominal area and lateral breast.  The #15 blade was used to make a 4 mm incision in each place.  The tumescent was placed in the abdominal adipose tissue layer and lateral breast area bilaterally. The liposuction was then done to harvest the adipose tissue into the Revolve.  The manufacture guidelines were used and the face was prepared according to the manufacture guidelines.  The tissue was placed in 10 cc syringes.  The right breast capsule was released using a forked blade.  This improved the wrinkling and asymmetry.  This was a 100 cm 2 area.  Liposuction was done on the lateral breast for improved symmetry. The adipose that was prepared was then injected into the breast were the scar tissue had been and was keeping the skin tight.  This improved the contour greatly and was 90 cc of adipose.    The left breast capsule was released using a forked blade.  This  improved the wrinkling and asymmetry.  This was a 100 cm 2 area.  Liposuction was done on the lateral breast for improved symmetry. The adipose that was prepared was then injected into the breast were the scar tissue had been and was keeping the skin tight.  This improved the contour greatly and was 90 cc of adipose.  All incisions were closed with the 4-0 and 5-0 Monocryl.   The patient was allowed to wake up and taken to recovery room in stable condition at the end of the case. The family was notified at the end of the case.   The advanced practice practitioner (APP) assisted throughout the case.  The APP was essential in retraction and counter traction when needed to make the case progress smoothly.  This retraction and assistance made it possible to see the tissue plans for the procedure.  The assistance was needed for blood control, tissue re-approximation and assisted with closure of the incision site.

## 2023-05-08 ENCOUNTER — Encounter (HOSPITAL_BASED_OUTPATIENT_CLINIC_OR_DEPARTMENT_OTHER): Payer: Self-pay | Admitting: Plastic Surgery

## 2023-05-09 ENCOUNTER — Ambulatory Visit (INDEPENDENT_AMBULATORY_CARE_PROVIDER_SITE_OTHER): Payer: MEDICAID | Admitting: Surgical

## 2023-05-09 DIAGNOSIS — Z9013 Acquired absence of bilateral breasts and nipples: Secondary | ICD-10-CM

## 2023-05-09 DIAGNOSIS — N644 Mastodynia: Secondary | ICD-10-CM

## 2023-05-09 DIAGNOSIS — Z9886 Personal history of breast implant removal: Secondary | ICD-10-CM

## 2023-05-09 NOTE — Progress Notes (Signed)
Patient is a 48 year old female postop day 2 from excision of bilateral breast scar contracture, lipo filling of bilateral breasts and liposuction of abdomen with Dr. Ulice Bold on 05/07/2023.  She reports that overall she is doing well today, does report some swelling in her bilateral hands, but otherwise no complaints.  She reports she has some pain that is well-controlled with Tylenol and oxycodone at night.    She is not having any infectious symptoms.  The patient gave consent to have this visit done by telemedicine / virtual visit, two identifiers were used to identify patient. This is also consent for access the chart and treat the patient via this visit. The patient is located in [place].  I, the provider, am at the office.  We spent 3 minutes together for the visit.  Joined by phone.  Recommend calling with any questions or concerns.  We will plan to see her in 1 week for initial postop in person evaluation.

## 2023-05-16 ENCOUNTER — Encounter: Payer: Self-pay | Admitting: Plastic Surgery

## 2023-05-16 ENCOUNTER — Ambulatory Visit (INDEPENDENT_AMBULATORY_CARE_PROVIDER_SITE_OTHER): Payer: MEDICAID | Admitting: Plastic Surgery

## 2023-05-16 VITALS — BP 121/77 | HR 86 | Ht 64.0 in | Wt 169.2 lb

## 2023-05-16 DIAGNOSIS — Z9013 Acquired absence of bilateral breasts and nipples: Secondary | ICD-10-CM

## 2023-05-16 NOTE — Progress Notes (Signed)
The patient is a 48 year old female here for follow-up after undergoing bilateral fat grafting with release of capsules.  This was done on January 15.  She had about 200 cc of adipose placed in the breast.  The area seems to be doing really well.  Both breasts are softer.  The form is much better.  There was a good release of the majority of the breast area.  She does have bruising and swelling as expected.  Will plan to see her back in 2 weeks.  Pictures were obtained of the patient and placed in the chart with the patient's or guardian's permission.

## 2023-05-19 ENCOUNTER — Ambulatory Visit (HOSPITAL_COMMUNITY): Payer: MEDICAID | Admitting: Clinical

## 2023-05-19 ENCOUNTER — Encounter (HOSPITAL_COMMUNITY): Payer: Self-pay | Admitting: Clinical

## 2023-05-19 DIAGNOSIS — F410 Panic disorder [episodic paroxysmal anxiety] without agoraphobia: Secondary | ICD-10-CM

## 2023-05-19 DIAGNOSIS — F319 Bipolar disorder, unspecified: Secondary | ICD-10-CM

## 2023-05-19 DIAGNOSIS — F4310 Post-traumatic stress disorder, unspecified: Secondary | ICD-10-CM

## 2023-05-19 DIAGNOSIS — F411 Generalized anxiety disorder: Secondary | ICD-10-CM

## 2023-05-19 NOTE — Progress Notes (Unsigned)
THERAPIST PROGRESS NOTE  Session Time: 4:02-4:34pm  Session #16  Virtual Visit via Video Note  I connected with Bethany [name] on 05/19/23 at  4:00 PM EST by a video enabled telemedicine application and verified that I am speaking with the correct person using two identifiers.  Location: Patient: home Provider: [facility] Outpatient therapy office   I discussed the limitations of evaluation and management by telemedicine and the availability of in person appointments. The patient expressed understanding and agreed to proceed.  I discussed the assessment and treatment plan with the patient. The patient was provided an opportunity to ask questions and all were answered. The patient agreed with the plan and demonstrated an understanding of the instructions.   The patient was advised to call back or seek an in-person evaluation if the symptoms worsen or if the condition fails to improve as anticipated.  I provided 32 minutes of non-face-to-face time during this encounter.  Bethany Chad, LCSW    Participation Level: Active  Behavioral Response: Casual Alert Anxious  Type of Therapy: Individual Therapy  Treatment Goals addressed:  Goal: STG: Report a decrease in anxiety symptoms as evidenced by an overall reduction in anxiety score by a minimum of 25% on the Generalized Anxiety Disorder Scale (GAD-7)  Goal: LTG: Explore personal core beliefs, rules and assumptions, and cognitive distortions through therapist using Cognitive Behavioral Therapy; learn how to develop replacement thoughts and challenge unhelpful thoughts.   Goal: STG: Learn DBT skills that might help with emotion regulation, distress tolerance, interpersonal effectiveness, and mindfulness  Goal: LTG: Increase ability to regulate mood as evidenced by fewer highs and fewer lows that impact Bethany [name]'s life  Goal: STG: Be free of suicidal thoughts; call crisis hotline if having suicidal thoughts   Goal: LTG: Learn  a variety of coping skills and demonstrate the ability to use them to decrease feelings of sadness, anger, and fear and increase feelings of happiness, peace, and powerfulness AEB gauging those emotions on 1-10 scale.   Goal: STG: Learn breathing techniques and grounding techniques at an age-appropriate level and demonstrate mastery in session then report independent use of these skills out of session.   Goal: LTG: Work on physical health, increasing endurance of walking and decreasing areas of body she is dissatisfied, such as specifically reducing her abdomen size.  Goal: LTG: Explore and resolve issues related to the various traumatic events in her life  Goal: STG: Learn about typical long term/residual effects of traumatic life experiences  Goal: STG: Learn coping skills and increase resilience through application of CBT techniques and through processing of life in a shame framework   Goal: LTG: Increase ability to trust other people, tolerate being around people, and endure noise that currently renders patient incapable of being around other people for long.  Goal: LTG: Improve ability to see world as others do, as well as ability to accept that they do not see the world as she does.  Goal: STG: Be able to use reality testing on her auditory/visual hallucinations and paranoid beliefs  Goal: LTG: Be more realistic in her giving habits surrounding the holidays instead of "going overboard" and set desirable boundaries with others as well as herself   ProgressTowards Goals: Progressing  Interventions: Assertiveness Training and Supportive  Summary: Bethany [name] is a 48 y.o. female who presents with Generalized Anxiety Disorder with panic attacks, Bipolar Disorder with psychotic features, and Post-Traumatic Stress Disorder.  She presented oriented x5 and stated she was feeling "better after  surgery but really dealing with my mental health."  CSW evaluated patient's medication compliance, use of  coping tools, and self-care, as applicable.   She shared that she has been staying out of the cold because of the impact she was told it could have on her surgical spots, is massage the lumps in her stomach where the liposuction took place in order to make the skin smooth.  She is pleased with the result, smiled broadly while discussing this.  She shared that she gave up her 2 dogs due to her mental health, specifically listing the hallucinations and paranoia as the biggest problems.  She knows they are now in good hands so does not even ask the new owners about the dogs.  She did not cry when giving them up, stated she has to take care of herself first and foremost.  She has been having some difficulty sleeping and said she usually sleeps at least from 8:30pm to 1:30-2:00am.  When she wakes up, she will pray and believes that helps.  She stated when she has a mental health day, she will put a message on voicemail to that effect and say that she is not answering the phone for that reason.  She refuses to feel bad for doing this, was provided support by CSW over this choice.  She has not been exercising, states that she did go out walking and had a panic attack, "freaking out, started shaking and looking around, got fuzzy in the head."  A man she did not know approached her and offered/provided help for her to get home.  CSW suggested that before going out on such an outing, she do some deep breathing for 15 minutes to start from a calmer place, also suggested knowing what route she is going to take so she is not so disoriented.  She was asked about hallucinations today and said she is not having visual or tactile hallucinations today but is having mild auditory hallucinations of whispering, does not know what is being said and does not try to figure it out.  She shared that she is working with her minister about this being witchcraft.  He has told her to track when it happens and he has provided her with biblical  scripture to use if it does happen.  She said the medicine helps but not enough.  When she feels strongly there is a witchcraft curse on her, she feels her hair stand up starting on her right leg then creeping over her whole body.  She stated, "Why somebody would witchcraft me, I don't know."  She has been in touch with her ex-husband who is now incarcerated, through him calling her and her accepting the call.  She stated he showed concern about her which pleased her.  She will be having surgery on her neck at some point as well.  She did not want to stay on the video for the entire session.  Suicidal/Homicidal: No without intent/plan  Therapist Response:  Patient is progressing AEB engaging in scheduled therapy session.  Throughout the session, CSW gave patient the opportunity to explore thoughts and feelings associated with current life situations and past/present stressors.   CSW challenged patient gently and appropriately to consider different ways of looking at reported issues. CSW encouraged patient's expression of feelings and validated these using empathy, active listening, open body language, and unconditional positive regard.   She has 2 upcoming group sessions and an individual session scheduled   Recommendations:  Return to  therapy in 2 weeks, follow her instinct to "push through" her paranoia to take a walk in the upcoming nice weather  Plan: Return in 2 weeks on 2/12 for group  Diagnosis: Bipolar disorder with psychotic features (HCC)  Post-traumatic stress disorder, unspecified  Generalized anxiety disorder with panic attacks  Collaboration of Care: Psychiatrist AEB - contacted doctor about timing of patient's medicines to help with 6:00pm problems  Patient/Guardian was advised Release of Information must be obtained prior to any record release in order to collaborate their care with an outside provider. Patient/Guardian was advised if they have not already done so to contact the  registration department to sign all necessary forms in order for [name] to release information regarding their care.   Consent: Patient/Guardian gives verbal consent for treatment and assignment of benefits for services provided during this visit. Patient/Guardian expressed understanding and agreed to proceed.   Bethany Chad, LCSW 05/19/2023

## 2023-05-27 ENCOUNTER — Ambulatory Visit: Payer: MEDICAID | Admitting: Surgical

## 2023-05-27 VITALS — BP 126/82 | HR 101

## 2023-05-27 DIAGNOSIS — M7502 Adhesive capsulitis of left shoulder: Secondary | ICD-10-CM

## 2023-05-27 DIAGNOSIS — N644 Mastodynia: Secondary | ICD-10-CM

## 2023-05-27 DIAGNOSIS — Z9013 Acquired absence of bilateral breasts and nipples: Secondary | ICD-10-CM

## 2023-05-27 DIAGNOSIS — Z9886 Personal history of breast implant removal: Secondary | ICD-10-CM

## 2023-05-27 NOTE — Progress Notes (Signed)
 Patient is a very pleasant 48 year old female here for follow-up after fat grafting to bilateral breasts and excision of bilateral breast scar contractures with Dr. Lowery on 05/07/2023.  She reports she is doing well, she is not having any infectious symptoms  She reports that she is doing well, she is not having any pain but does report some mild discomfort/soreness of her abdomen from the liposuction.  Chaperone present on exam On exam bilateral NAC's are viable, bilateral breast incisions are intact, abdominal incisions are intact.  There is no erythema or cellulitic changes noted of abdomen or her breast. There is no subcutaneous fluid collection noted of her breast.    A/P:  Patient is healing well, there is no signs infection or concern on exam.  Recommend following up in a few weeks for reevaluation.  Recommend calling with questions or concerns.  Recommend continue with compressive garments and avoiding strenuous activities or heavy lifting.

## 2023-05-28 ENCOUNTER — Other Ambulatory Visit (HOSPITAL_COMMUNITY): Payer: Self-pay | Admitting: Psychiatry

## 2023-05-28 DIAGNOSIS — F4310 Post-traumatic stress disorder, unspecified: Secondary | ICD-10-CM

## 2023-05-28 DIAGNOSIS — G4700 Insomnia, unspecified: Secondary | ICD-10-CM

## 2023-05-28 DIAGNOSIS — F3160 Bipolar disorder, current episode mixed, unspecified: Secondary | ICD-10-CM

## 2023-06-04 ENCOUNTER — Ambulatory Visit (INDEPENDENT_AMBULATORY_CARE_PROVIDER_SITE_OTHER): Payer: MEDICAID | Admitting: Clinical

## 2023-06-04 ENCOUNTER — Encounter (HOSPITAL_COMMUNITY): Payer: Self-pay | Admitting: Clinical

## 2023-06-04 DIAGNOSIS — F4310 Post-traumatic stress disorder, unspecified: Secondary | ICD-10-CM

## 2023-06-04 DIAGNOSIS — F410 Panic disorder [episodic paroxysmal anxiety] without agoraphobia: Secondary | ICD-10-CM

## 2023-06-04 DIAGNOSIS — F319 Bipolar disorder, unspecified: Secondary | ICD-10-CM

## 2023-06-04 DIAGNOSIS — F411 Generalized anxiety disorder: Secondary | ICD-10-CM | POA: Diagnosis not present

## 2023-06-04 NOTE — Progress Notes (Signed)
Therapy Group Progress Note   06/07/2023   Group Time:  5:00pm-6:00pm  Participation Level:  Active   Behavioral Response: Appropriate, Sharing, and Motivated   Type of Therapy: Group Therapy   Check-In:   Patient shared that she is in group because of having to wait so long for an individual appointment.  Her face was not shown on screen.  Current Concerns:   Shared: Patient reported out to the group that she has an issue with seeing things that are not there, stating she can see "evil on people and in people."  She said she believes in witchcraft but does not perform it.  She shared that she sees dead people lining the street which she does not believe should be happening.  Her pastor has given her healing oil and told her to ask for the gift of discernment.  She is not sure she wants to be able to tell who is evil and who is not evil.  She stated she has been doing reality testing as recommended in individual sessions and it is helpful.  She does think her psychosis is worsening, however.  She liked another person's suggestion during group that she sage her home, stated she had forgotten about that but does need to get some sage to do a cleansing of her house.  Intervention(s):  Boundaries were discussed and commonalities among patients were pointed out.  Summary of Progress:  SAFIYA GIRDLER verbalized full understanding of concepts about boundaries as presented.     Recommendations:  It is recommended that [name] continue considering all brought up by CSW and fellow patients throughout group and return to group at next scheduled time or to individual therapy at next scheduled session.  Progress Towards Goals: Progressing   Encounter Diagnoses  Name Primary?   Post-traumatic stress disorder, unspecified    Generalized anxiety disorder with panic attacks    Bipolar disorder with psychotic features [facility]) Yes     Virtual Visit via Video Note  I connected with [name] on  06/07/23 at 5:00pm by [employer] Teams and verified that I am speaking with the correct person using two identifiers.  Location: Patient: Home Provider: [facility] Outpatient Therapy Office - [facility]   I discussed the limitations of evaluation and management by telemedicine and the availability of in person appointments. The patient expressed understanding and agreed to proceed.   I discussed the assessment and treatment plan with the patient. The patient was provided an opportunity to ask questions and all were answered. The patient agreed with the plan and demonstrated an understanding of the instructions.   The patient was advised to call back or seek an in-person evaluation if the symptoms worsen or if the condition fails to improve as anticipated.  I provided 60 minutes of non-face-to-face time during this encounter.  Ambrose Mantle, LCSW 06/07/2023, 3:25 PM

## 2023-06-10 ENCOUNTER — Encounter: Payer: Self-pay | Admitting: Surgical

## 2023-06-10 ENCOUNTER — Ambulatory Visit (INDEPENDENT_AMBULATORY_CARE_PROVIDER_SITE_OTHER): Payer: MEDICAID | Admitting: Surgical

## 2023-06-10 VITALS — BP 135/86 | HR 97 | Ht 64.0 in | Wt 166.8 lb

## 2023-06-10 DIAGNOSIS — Z9013 Acquired absence of bilateral breasts and nipples: Secondary | ICD-10-CM

## 2023-06-10 DIAGNOSIS — N644 Mastodynia: Secondary | ICD-10-CM

## 2023-06-10 DIAGNOSIS — Z9886 Personal history of breast implant removal: Secondary | ICD-10-CM

## 2023-06-10 NOTE — Progress Notes (Signed)
Patient is a very pleasant 48 year old female here for follow-up after fat grafting to bilateral breast and excision of bilateral scar contractures with Dr. Ulice Bold on 05/07/2023.  She is 5 weeks postop.  She reports she is overall doing well, she is not having any infectious symptoms.  She reports she is not having any pain any longer.  She does have some questions about possibility for additional fat grafting to her breast, particularly on the inferior side of the left breast as well as the superior sides to improve the shape.  Chaperone present on exam On exam bilateral NAC's are viable, bilateral breast incisions are intact and healing well.  There is no erythema or cellulitic changes or subcutaneous fluid collection noted of her breast.  She does have some scarring still present on the left inframammary fold and just inferior to the NAC.  She also has some firmness in the left and right superior lateral breasts, firmness consistent with some fat necrosis and/or postsurgical swelling.  A/P:  She is overall healing very well, 5 weeks out from surgery.  Recommend continue with compressive garments and avoid strenuous activities for 1 more week.  Recommend massaging firmness of bilateral breasts to help soften. Recommend continuing to monitor the areas for any changes, notify us if she sees any concerning changes and would like to be reevaluated prior to her 6-week follow-up.  Recommend following up in about 6 weeks for reevaluation.  There is no signs of infection or concern on exam.

## 2023-06-17 ENCOUNTER — Encounter (HOSPITAL_COMMUNITY): Payer: Self-pay | Admitting: Clinical

## 2023-06-18 ENCOUNTER — Ambulatory Visit (INDEPENDENT_AMBULATORY_CARE_PROVIDER_SITE_OTHER): Payer: MEDICAID | Admitting: Clinical

## 2023-06-18 DIAGNOSIS — Z91198 Patient's noncompliance with other medical treatment and regimen for other reason: Secondary | ICD-10-CM

## 2023-06-18 NOTE — Progress Notes (Signed)
 Bethany [name] was scheduled to attend group therapy on 06/18/23 from 5:00-6:00pm.  A MyChart message was sent to them to remind them that this group is held via [employer] instead of through [employer].  An email was sent with the same reminder, as well as the link to the meeting on [employer] Teams.   It was noted that the patient showed up through her MyChart for the meeting, so brief contact was made to let her know it was via [employer].  [name] did not show up for the group despite these reminders.   Encounter Diagnosis  Name Primary?   Failure to attend appointment with reason given Yes     Ambrose Mantle, LCSW 06/18/2023, 6:20 PM

## 2023-06-24 ENCOUNTER — Other Ambulatory Visit: Payer: Self-pay | Admitting: Orthopedic Surgery

## 2023-06-29 ENCOUNTER — Other Ambulatory Visit (HOSPITAL_COMMUNITY): Payer: Self-pay | Admitting: Psychiatry

## 2023-06-29 DIAGNOSIS — F3160 Bipolar disorder, current episode mixed, unspecified: Secondary | ICD-10-CM

## 2023-07-07 ENCOUNTER — Telehealth (HOSPITAL_BASED_OUTPATIENT_CLINIC_OR_DEPARTMENT_OTHER): Payer: MEDICAID | Admitting: Psychiatry

## 2023-07-07 ENCOUNTER — Encounter (HOSPITAL_COMMUNITY): Payer: Self-pay | Admitting: Psychiatry

## 2023-07-07 ENCOUNTER — Other Ambulatory Visit (HOSPITAL_COMMUNITY): Payer: Self-pay | Admitting: Psychiatry

## 2023-07-07 VITALS — Wt 166.0 lb

## 2023-07-07 DIAGNOSIS — F4310 Post-traumatic stress disorder, unspecified: Secondary | ICD-10-CM | POA: Diagnosis not present

## 2023-07-07 DIAGNOSIS — G4700 Insomnia, unspecified: Secondary | ICD-10-CM

## 2023-07-07 DIAGNOSIS — F3160 Bipolar disorder, current episode mixed, unspecified: Secondary | ICD-10-CM

## 2023-07-07 MED ORDER — SERTRALINE HCL 50 MG PO TABS: 75.0000 mg | ORAL_TABLET | Freq: Every day | ORAL | 1 refills | Status: DC

## 2023-07-07 MED ORDER — LITHIUM CARBONATE ER 450 MG PO TBCR: 450.0000 mg | EXTENDED_RELEASE_TABLET | Freq: Three times a day (TID) | ORAL | 1 refills | Status: DC

## 2023-07-07 MED ORDER — QUETIAPINE FUMARATE 300 MG PO TABS: 300.0000 mg | ORAL_TABLET | Freq: Every day | ORAL | 1 refills | Status: DC

## 2023-07-07 NOTE — Progress Notes (Signed)
 [facility] MD Virtual Progress Note   Patient Location: Home Provider Location: Home Office  I connect with patient by video and verified that I am speaking with correct person by using two identifiers. I discussed the limitations of evaluation and management by telemedicine and the availability of in person appointments. I also discussed with the patient that there may be a patient responsible charge related to this service. The patient expressed understanding and agreed to proceed.  Bethany [name] [id number] 48 y.o.  07/07/2023 10:52 AM  History of Present Illness:  Patient is evaluated by video session.  On the last visit we increase Zoloft and lithium.  She noticed improvement in her anxiety but is still occasionally had hearing voices which she described whispering.  She started walking almost every day with her daughter and that has been helpful.  However reported paranoia and she had mention one time felt so paranoid that she informed the person who is walking behind her that he is following her.  Patient told the man was very nice and replied that she is not following her and her daughter who was present there able to handle the situation.  Patient reported she was feeling very embarrassed.  Now whenever she had paranoia she tried to calm herself and take deeply.  She is in therapy with Ms. Lucretia Field she missed last appointment.  She still have nightmares and flashbacks.  She tried to read books and distract herself but her sleep is somewhat better from the past.  Recently she had surgery for breast reduction that went well.  She feels more active since then.  She has a another surgery for her neck coming up on April 3.  She is very pleased that her daughter checks on her on a regular basis.  She admitted some time ruminative thoughts but denies any suicidal thoughts or homicidal thoughts.  She has no tremors, shakes or any EPS.  She tried to connect with charge.  Activities.  Her  appetite is okay.  Her weight is stable.  She denies any aggression, violence or any mania.  Patient denies drinking or using any illegal substances.  Past Psychiatric History: Saw therapist in [facility] after brother killed.  Took Zoloft and lorazepam by PCP.  She took Lamictal but stopped after the rash.  Trazodone helped but wanted to try something for anxiety and it was switched to hydroxyzine.  No h/o inpatient treatment, suicidal attempt, psychosis or any mania.  H/O irritability, anger and mood swing.  Tried Lamictal but stopped due to rash.    Outpatient Encounter Medications as of 07/07/2023  Medication Sig   acetaminophen (TYLENOL) 650 MG CR tablet Take 1,300 mg by mouth every 8 (eight) hours as needed for pain (or headaches).   lidocaine 4 % Place 1 patch onto the skin daily as needed (for pain- affected area).   linaclotide (LINZESS) 145 MCG CAPS capsule TAKE 1 CAPSULE BY MOUTH EVERY DAY BEFORE BREAKFAST - INSURANCE WILL PAY ON 6/25   lithium carbonate (ESKALITH) 450 MG ER tablet Take 1 tablet (450 mg total) by mouth 3 (three) times daily.   naproxen (NAPROSYN) 500 MG tablet Take 1 tablet (500 mg total) by mouth 2 (two) times daily as needed.   ondansetron (ZOFRAN) 4 MG tablet Take 1 tablet (4 mg total) by mouth every 8 (eight) hours as needed for nausea or vomiting.   pantoprazole (PROTONIX) 40 MG tablet TAKE 1 TABLET BY MOUTH EVERY DAY   promethazine (PHENERGAN)  12.5 MG tablet Take 1 tablet (12.5 mg total) by mouth every 6 (six) hours as needed for nausea or vomiting.   QUEtiapine (SEROQUEL) 200 MG tablet Take 1 tablet (200 mg total) by mouth at bedtime.   sertraline (ZOLOFT) 50 MG tablet Take 1.5 tablets (75 mg total) by mouth daily.   No facility-administered encounter medications on file as of 07/07/2023.    Recent Results (from the past 2160 hours)  Pregnancy, urine POC     Status: None   Collection Time: 05/07/23 11:23 AM  Result Value Ref Range   Preg Test, Ur  NEGATIVE NEGATIVE    Comment:        THE SENSITIVITY OF THIS METHODOLOGY IS >24 mIU/mL      Psychiatric Specialty Exam: Physical Exam  Review of Systems  Weight 166 lb (75.3 kg).There is no height or weight on file to calculate BMI.  General Appearance: Casual  Eye Contact:  Good  Speech:  Normal Rate  Volume:  Normal  Mood:  Anxious  Affect:  Congruent  Thought Process:  Descriptions of Associations: Intact  Orientation:  Full (Time, Place, and Person)  Thought Content:  Hallucinations: Auditory Whispering , Paranoid Ideation, and Rumination  Suicidal Thoughts:  No  Homicidal Thoughts:  No  Memory:  Immediate;   Fair Recent;   Fair Remote;   Fair  Judgement:  Fair  Insight:  Present  Psychomotor Activity:  Decreased  Concentration:  Concentration: Fair and Attention Span: Fair  Recall:  Good  Fund of Knowledge:  Good  Language:  Fair  Akathisia:  No  Handed:  Right  AIMS (if indicated):     Assets:  Communication Skills Desire for Improvement Housing Social Support  ADL's:  Intact  Cognition:  WNL  Sleep:  better     Assessment/Plan: Mixed bipolar I disorder (HCC) - Plan: lithium carbonate (ESKALITH) 450 MG ER tablet, QUEtiapine (SEROQUEL) 300 MG tablet, sertraline (ZOLOFT) 50 MG tablet  Insomnia, unspecified type - Plan: QUEtiapine (SEROQUEL) 300 MG tablet, sertraline (ZOLOFT) 50 MG tablet  Post-traumatic stress disorder, unspecified - Plan: sertraline (ZOLOFT) 50 MG tablet  Review blood work results and notes from other providers.  She is doing very well.  She like to increase dose of lithium and sertraline but is still struggling with paranoia, poor sleep, racing thoughts and nightmares.  Recommend to increase Seroquel from 200 mg to 300 mg.  Encouraged regular walking which she is doing good.  Encouraged to keep appointment with Ms. Davonna Belling to help with coping skills.  Continue lithium 450 mg 3 times a day and Zoloft 75 mg daily.  So far no tremor or shakes  or any EPS.  We will do CBC, CMP, hemoglobin A1c and lithium level next week.  Recommended to call us back if she has any question or any concern.  Follow-up in 2 months.   Follow Up Instructions:     I discussed the assessment and treatment plan with the patient. The patient was provided an opportunity to ask questions and all were answered. The patient agreed with the plan and demonstrated an understanding of the instructions.   The patient was advised to call back or seek an in-person evaluation if the symptoms worsen or if the condition fails to improve as anticipated.    Collaboration of Care: Other provider involved in patient's care AEB notes are available in epic to review  Patient/Guardian was advised Release of Information must be obtained prior to any record release in  order to collaborate their care with an outside provider. Patient/Guardian was advised if they have not already done so to contact the registration department to sign all necessary forms in order for [name] to release information regarding their care.   Consent: Patient/Guardian gives verbal consent for treatment and assignment of benefits for services provided during this visit. Patient/Guardian expressed understanding and agreed to proceed.     I provided 26 minutes of non face to face time during this encounter.  Note: This document was prepared by [employer] voice dictation technology and any errors that results from this process are unintentional.    Cleotis Nipper, MD 07/07/2023

## 2023-07-08 ENCOUNTER — Ambulatory Visit (HOSPITAL_COMMUNITY): Payer: MEDICAID

## 2023-07-11 ENCOUNTER — Ambulatory Visit (HOSPITAL_BASED_OUTPATIENT_CLINIC_OR_DEPARTMENT_OTHER): Payer: MEDICAID

## 2023-07-11 DIAGNOSIS — Z5181 Encounter for therapeutic drug level monitoring: Secondary | ICD-10-CM

## 2023-07-11 DIAGNOSIS — F319 Bipolar disorder, unspecified: Secondary | ICD-10-CM

## 2023-07-11 NOTE — Progress Notes (Signed)
 Patient arrived today for her due labs - CBC, CMP, A1c, and Lithium level. Patient was unaware that the doctor ordered a urine drug screen and used the bathroom before she came in the office. Patient will return on Monday for the UA.

## 2023-07-11 NOTE — Pre-Procedure Instructions (Signed)
 Surgical Instructions   Your procedure is scheduled on Thursday, April 3rd. Report to [facility] Main Entrance "A" at 07:30 A.M., then check in with the Admitting office. Any questions or running late day of surgery: call [phone]  Questions prior to your surgery date: call [phone], Monday-Friday, 8am-4pm. If you experience any cold or flu symptoms such as cough, fever, chills, shortness of breath, etc. between now and your scheduled surgery, please notify us at the above number.     Remember:  Do not eat after midnight the night before your surgery  You may drink clear liquids until 07:30 AM the morning of your surgery.   Clear liquids allowed are: Water, Non-Citrus Juices (without pulp), Carbonated Beverages, Clear Tea (no milk, honey, etc.), Black Coffee Only (NO MILK, CREAM OR POWDERED CREAMER of any kind), and Gatorade.  Patient Instructions  The night before surgery:  No food after midnight. ONLY clear liquids after midnight  The day of surgery (if you do NOT have diabetes):  Drink ONE (1) Pre-Surgery Clear Ensure by 07:30 AM the morning of surgery. Drink in one sitting. Do not sip.  This drink was given to you during your hospital  pre-op appointment visit.  Nothing else to drink after completing the  Pre-Surgery Clear Ensure.         If you have questions, please contact your surgeon's office.    Take these medicines the morning of surgery with A SIP OF WATER  lithium carbonate (ESKALITH)  pantoprazole (PROTONIX)  sertraline (ZOLOFT)    May take these medicines IF NEEDED: carboxymethylcellulose (REFRESH PLUS)    One week prior to surgery, STOP taking any Aspirin (unless otherwise instructed by your surgeon) Aleve, Naproxen, Ibuprofen, Motrin, Advil, Goody's, BC's, all herbal medications, fish oil, and non-prescription vitamins.                     Do NOT Smoke (Tobacco/Vaping) for 24 hours prior to your procedure.  If you use a CPAP at night, you may  bring your mask/headgear for your overnight stay.   You will be asked to remove any contacts, glasses, piercing's, hearing aid's, dentures/partials prior to surgery. Please bring cases for these items if needed.    Patients discharged the day of surgery will not be allowed to drive home, and someone needs to stay with them for 24 hours.  SURGICAL WAITING ROOM VISITATION Patients may have no more than 2 support people in the waiting area - these visitors may rotate.   Pre-op nurse will coordinate an appropriate time for 1 ADULT support person, who may not rotate, to accompany patient in pre-op.  Children under the age of 62 must have an adult with them who is not the patient and must remain in the main waiting area with an adult.  If the patient needs to stay at the hospital during part of their recovery, the visitor guidelines for inpatient rooms apply.  Please refer to the [facility] website for the visitor guidelines for any additional information.   If you received a COVID test during your pre-op visit  it is requested that you wear a mask when out in public, stay away from anyone that may not be feeling well and notify your surgeon if you develop symptoms. If you have been in contact with anyone that has tested positive in the last 10 days please notify you surgeon.      Pre-operative 5 CHG Bathing Instructions   You can play a  key role in reducing the risk of infection after surgery. Your skin needs to be as free of germs as possible. You can reduce the number of germs on your skin by washing with CHG (chlorhexidine gluconate) soap before surgery. CHG is an antiseptic soap that kills germs and continues to kill germs even after washing.   DO NOT use if you have an allergy to chlorhexidine/CHG or antibacterial soaps. If your skin becomes reddened or irritated, stop using the CHG and notify one of our RNs at [phone].   Please shower with the CHG soap starting 4 days before surgery  using the following schedule:     Please keep in mind the following:  DO NOT shave, including legs and underarms, starting the day of your first shower.   You may shave your face at any point before/day of surgery.  Place clean sheets on your bed the day you start using CHG soap. Use a clean washcloth (not used since being washed) for each shower. DO NOT sleep with pets once you start using the CHG.   CHG Shower Instructions:  Wash your face and private area with normal soap. If you choose to wash your hair, wash first with your normal shampoo.  After you use shampoo/soap, rinse your hair and body thoroughly to remove shampoo/soap residue.  Turn the water OFF and apply about 3 tablespoons (45 ml) of CHG soap to a CLEAN washcloth.  Apply CHG soap ONLY FROM YOUR NECK DOWN TO YOUR TOES (washing for 3-5 minutes)  DO NOT use CHG soap on face, private areas, open wounds, or sores.  Pay special attention to the area where your surgery is being performed.  If you are having back surgery, having someone wash your back for you may be helpful. Wait 2 minutes after CHG soap is applied, then you may rinse off the CHG soap.  Pat dry with a clean towel  Put on clean clothes/pajamas   If you choose to wear lotion, please use ONLY the CHG-compatible lotions that are listed below.  Additional instructions for the day of surgery: DO NOT APPLY any lotions, deodorants, cologne, or perfumes.   Do not bring valuables to the hospital. [facility] is not responsible for any belongings/valuables. Do not wear nail polish, gel polish, artificial nails, or any other type of covering on natural nails (fingers and toes) Do not wear jewelry or makeup Put on clean/comfortable clothes.  Please brush your teeth.  Ask your nurse before applying any prescription medications to the skin.     CHG Compatible Lotions   Aveeno Moisturizing lotion  Cetaphil Moisturizing Cream  Cetaphil Moisturizing Lotion  Clairol  Herbal Essence Moisturizing Lotion, Dry Skin  Clairol Herbal Essence Moisturizing Lotion, Extra Dry Skin  Clairol Herbal Essence Moisturizing Lotion, Normal Skin  Curel Age Defying Therapeutic Moisturizing Lotion with Alpha Hydroxy  Curel Extreme Care Body Lotion  Curel Soothing Hands Moisturizing Hand Lotion  Curel Therapeutic Moisturizing Cream, Fragrance-Free  Curel Therapeutic Moisturizing Lotion, Fragrance-Free  Curel Therapeutic Moisturizing Lotion, Original Formula  Eucerin Daily Replenishing Lotion  Eucerin Dry Skin Therapy Plus Alpha Hydroxy Crme  Eucerin Dry Skin Therapy Plus Alpha Hydroxy Lotion  Eucerin Original Crme  Eucerin Original Lotion  Eucerin Plus Crme Eucerin Plus Lotion  Eucerin TriLipid Replenishing Lotion  Keri Anti-Bacterial Hand Lotion  Keri Deep Conditioning Original Lotion Dry Skin Formula Softly Scented  Keri Deep Conditioning Original Lotion, Fragrance Free Sensitive Skin Formula  Keri Lotion Fast Absorbing Fragrance Free Sensitive Skin  Formula  Keri Lotion Fast Absorbing Softly Scented Dry Skin Formula  Keri Original Lotion  Keri Skin Renewal Lotion Keri Silky Smooth Lotion  Keri Silky Smooth Sensitive Skin Lotion  Nivea Body Creamy Conditioning Oil  Nivea Body Extra Enriched Lotion  Nivea Body Original Lotion  Nivea Body Sheer Moisturizing Lotion Nivea Crme  Nivea Skin Firming Lotion  NutraDerm 30 Skin Lotion  NutraDerm Skin Lotion  NutraDerm Therapeutic Skin Cream  NutraDerm Therapeutic Skin Lotion  ProShield Protective Hand Cream  Provon moisturizing lotion  Please read over the following fact sheets that you were given.

## 2023-07-14 ENCOUNTER — Encounter (HOSPITAL_COMMUNITY): Payer: Self-pay | Admitting: *Deleted

## 2023-07-14 ENCOUNTER — Encounter (HOSPITAL_COMMUNITY)
Admission: RE | Admit: 2023-07-14 | Discharge: 2023-07-14 | Disposition: A | Discharge status: Home / Self Care | Payer: MEDICAID | Source: Ambulatory Visit | Attending: Orthopedic Surgery | Admitting: Orthopedic Surgery

## 2023-07-14 ENCOUNTER — Ambulatory Visit (HOSPITAL_COMMUNITY): Payer: MEDICAID

## 2023-07-14 ENCOUNTER — Other Ambulatory Visit: Payer: Self-pay

## 2023-07-14 ENCOUNTER — Other Ambulatory Visit (HOSPITAL_COMMUNITY): Payer: Self-pay | Admitting: Psychiatry

## 2023-07-14 VITALS — BP 111/74 | HR 99 | Temp 98.5°F | Resp 18 | Ht 64.0 in | Wt 170.6 lb

## 2023-07-14 DIAGNOSIS — F4310 Post-traumatic stress disorder, unspecified: Secondary | ICD-10-CM | POA: Insufficient documentation

## 2023-07-14 DIAGNOSIS — G4700 Insomnia, unspecified: Secondary | ICD-10-CM | POA: Insufficient documentation

## 2023-07-14 DIAGNOSIS — Z5181 Encounter for therapeutic drug level monitoring: Secondary | ICD-10-CM

## 2023-07-14 DIAGNOSIS — Z01818 Encounter for other preprocedural examination: Secondary | ICD-10-CM | POA: Diagnosis present

## 2023-07-14 DIAGNOSIS — F3160 Bipolar disorder, current episode mixed, unspecified: Secondary | ICD-10-CM | POA: Diagnosis not present

## 2023-07-14 HISTORY — DX: Bipolar disorder, unspecified: F31.9

## 2023-07-14 HISTORY — DX: Gastro-esophageal reflux disease without esophagitis: K21.9

## 2023-07-14 LAB — CBC WITH DIFFERENTIAL/PLATELET
Basophils Absolute: 0 10*3/uL (ref 0.0–0.2)
Basos: 1 %
EOS (ABSOLUTE): 0.1 10*3/uL (ref 0.0–0.4)
Eos: 2 %
Hematocrit: 37.9 % (ref 34.0–46.6)
Hemoglobin: 12.3 g/dL (ref 11.1–15.9)
Immature Grans (Abs): 0 10*3/uL (ref 0.0–0.1)
Immature Granulocytes: 0 %
Lymphocytes Absolute: 1.8 10*3/uL (ref 0.7–3.1)
Lymphs: 34 %
MCH: 27.8 pg (ref 26.6–33.0)
MCHC: 32.5 g/dL (ref 31.5–35.7)
MCV: 86 fL (ref 79–97)
Monocytes Absolute: 0.3 10*3/uL (ref 0.1–0.9)
Monocytes: 7 %
Neutrophils Absolute: 2.9 10*3/uL (ref 1.4–7.0)
Neutrophils: 56 %
Platelets: 149 10*3/uL — ABNORMAL LOW (ref 150–450)
RBC: 4.43 x10E6/uL (ref 3.77–5.28)
RDW: 13.9 % (ref 11.7–15.4)
WBC: 5.1 10*3/uL (ref 3.4–10.8)

## 2023-07-14 LAB — COMPREHENSIVE METABOLIC PANEL
ALT: 17 IU/L (ref 0–32)
AST: 23 IU/L (ref 0–40)
Albumin: 4.3 g/dL (ref 3.9–4.9)
Alkaline Phosphatase: 88 IU/L (ref 44–121)
BUN/Creatinine Ratio: 8 — ABNORMAL LOW (ref 9–23)
BUN: 7 mg/dL (ref 6–24)
Bilirubin Total: <0.2 mg/dL (ref 0.0–1.2)
CO2: 17 mmol/L — ABNORMAL LOW (ref 20–29)
Calcium: 9.6 mg/dL (ref 8.7–10.2)
Chloride: 105 mmol/L (ref 96–106)
Creatinine, Ser: 0.89 mg/dL (ref 0.57–1.00)
Globulin, Total: 2.7 g/dL (ref 1.5–4.5)
Glucose: 136 mg/dL — ABNORMAL HIGH (ref 70–99)
Potassium: 4.3 mmol/L (ref 3.5–5.2)
Sodium: 141 mmol/L (ref 134–144)
Total Protein: 7 g/dL (ref 6.0–8.5)
eGFR: 79 mL/min/{1.73_m2} (ref >59)

## 2023-07-14 LAB — HEMOGLOBIN A1C
Est. average glucose Bld gHb Est-mCnc: 134 mg/dL
Hgb A1c MFr Bld: 6.3 % — ABNORMAL HIGH (ref 4.8–5.6)

## 2023-07-14 LAB — SURGICAL PCR SCREEN
MRSA, PCR: NEGATIVE
Staphylococcus aureus: NEGATIVE

## 2023-07-14 LAB — LITHIUM LEVEL: Lithium Lvl: 1 mmol/L (ref 0.5–1.2)

## 2023-07-14 NOTE — Progress Notes (Signed)
 PCP - Dr. [name] Cardiologist - denies  PPM/ICD - denies   Chest x-ray - 10/31/19 EKG - 12/16/22 Stress Test - denies ECHO - denies Cardiac Cath - denies  Sleep Study - denies   DM- denies  Last dose of GLP1 agonist-  n/a   ASA/Blood Thinner Instructions: n/a   ERAS Protcol - clears until 0730 PRE-SURGERY Ensure given  COVID TEST- n/a   Anesthesia review: no  Patient denies shortness of breath, fever, cough and chest pain at PAT appointment   All instructions explained to the patient, with a verbal understanding of the material. Patient agrees to go over the instructions while at home for a better understanding.  The opportunity to ask questions was provided.

## 2023-07-14 NOTE — Progress Notes (Signed)
 Patient arrives today for her UDS. Patient was unable to do it on Friday.

## 2023-07-17 LAB — URINE DRUG PANEL 7
6-ACETYLMORPHINE IA: NEGATIVE
Amphetamines: NEGATIVE
Barbiturates, IA: NEGATIVE
COCAINE METABOLITE IA: NEGATIVE
Creatinine: 56
ETHANOL BIOMARKERS IA: NEGATIVE
FENTANYL, IA: NEGATIVE
Nitrites: NEGATIVE
OPIATE CLASS IA: NEGATIVE
OXYCODONE CLASS IA: NEGATIVE
PHENCYCLIDINE IA: NEGATIVE
Urine pH: 7.1

## 2023-07-17 LAB — DRUG SCREEN 10, SCR W/CONF, UR
6-Acetylmorphine: NEGATIVE ng/mL
Amphetamines IA: NEGATIVE ng/mL
Barbiturates: NEGATIVE ng/mL
Benzodiazepines: NEGATIVE ng/mL
Cocaine Metabolite: NEGATIVE ng/mL
Creatinine: 56 mg/dL
Ethyl Glucuronide: NEGATIVE ng/mL
Fentanyl: NEGATIVE ng/mL
Nitrites: NEGATIVE ug/mL
Opiates: NEGATIVE ng/mL
Oxycodone: NEGATIVE ng/mL
Phencyclidine (PCP): NEGATIVE ng/mL
Urine pH: 7.1 (ref 4.5–8.9)

## 2023-07-18 ENCOUNTER — Telehealth: Payer: Self-pay | Admitting: *Deleted

## 2023-07-18 NOTE — Telephone Encounter (Signed)
 Received on (07/16/23) via of fax DME Standard Written Order from [facility] [place].  Requesting review,sign,and return.  Given to provider to complete.    DME Standard Written Order reviewed,signed, and returned.  Confirmation received and copy scanned into the chart.//AB/CMA

## 2023-07-21 ENCOUNTER — Ambulatory Visit (INDEPENDENT_AMBULATORY_CARE_PROVIDER_SITE_OTHER): Payer: MEDICAID | Admitting: Clinical

## 2023-07-21 ENCOUNTER — Encounter (HOSPITAL_COMMUNITY): Payer: Self-pay | Admitting: Clinical

## 2023-07-21 DIAGNOSIS — F319 Bipolar disorder, unspecified: Secondary | ICD-10-CM

## 2023-07-21 DIAGNOSIS — F4310 Post-traumatic stress disorder, unspecified: Secondary | ICD-10-CM
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
## 2023-07-23 ENCOUNTER — Ambulatory Visit (INDEPENDENT_AMBULATORY_CARE_PROVIDER_SITE_OTHER): Payer: MEDICAID | Admitting: Surgical

## 2023-07-23 VITALS — BP 127/77 | HR 94

## 2023-07-23 DIAGNOSIS — Z9013 Acquired absence of bilateral breasts and nipples: Secondary | ICD-10-CM

## 2023-07-23 DIAGNOSIS — Z9886 Personal history of breast implant removal: Secondary | ICD-10-CM

## 2023-07-23 NOTE — Progress Notes (Signed)
 Patient is a very pleasant 48 year old female here for follow-up after fat grafting bilateral breast and excision of bilateral scar contractures on 05/07/2023 with Dr. Ulice Bold.  She is about 2 and half months postop.  She reports she is overall doing well in regards to recovery.  She does not report any pain.  She does have questions about some areas where she still has some folding of the skin and would like to know about options for improvement.  Chaperone present on exam Bilateral NAC's are viable, breast incisions are intact and well-healed.  There is no erythema or cellulitic changes noted.  No subcutaneous fluid collections noted with palpation.  She does have some scarring still present along the inframammary folds and above the left NAC.  A/P:  Patient is healing well after fat grafting, she does have some areas that still concern her that she would like additional fat grafting if possible.  We discussed allowing the fat to continue to take and swelling to improve, recommend following up in 3 months for reevaluation with Dr. Ulice Bold to discuss additional fat grafting if possible.  Patient was agreeable with this plan, discussed continue with compression when active, but no longer necessary when not completing physical activities.  We discussed no restrictions otherwise.  Recommend calling with questions or concerns.

## 2023-07-24 ENCOUNTER — Encounter (HOSPITAL_COMMUNITY)
Admission: RE | Disposition: A | Discharge status: Home / Self Care | Payer: Self-pay | Source: Home / Self Care | Attending: Orthopedic Surgery

## 2023-07-24 ENCOUNTER — Ambulatory Visit (HOSPITAL_BASED_OUTPATIENT_CLINIC_OR_DEPARTMENT_OTHER): Payer: MEDICAID

## 2023-07-24 ENCOUNTER — Other Ambulatory Visit (HOSPITAL_COMMUNITY): Payer: Self-pay

## 2023-07-24 ENCOUNTER — Encounter (HOSPITAL_COMMUNITY): Payer: Self-pay | Admitting: Orthopedic Surgery

## 2023-07-24 ENCOUNTER — Other Ambulatory Visit: Payer: Self-pay

## 2023-07-24 ENCOUNTER — Ambulatory Visit (HOSPITAL_COMMUNITY)
Admission: RE | Admit: 2023-07-24 | Discharge: 2023-07-24 | Disposition: A | Discharge status: Home / Self Care | Payer: MEDICAID | Attending: Orthopedic Surgery | Admitting: Orthopedic Surgery

## 2023-07-24 ENCOUNTER — Ambulatory Visit (HOSPITAL_COMMUNITY): Payer: MEDICAID

## 2023-07-24 DIAGNOSIS — F418 Other specified anxiety disorders: Secondary | ICD-10-CM

## 2023-07-24 DIAGNOSIS — K219 Gastro-esophageal reflux disease without esophagitis: Secondary | ICD-10-CM | POA: Insufficient documentation

## 2023-07-24 DIAGNOSIS — M4802 Spinal stenosis, cervical region: Secondary | ICD-10-CM | POA: Insufficient documentation

## 2023-07-24 DIAGNOSIS — Z5986 Financial insecurity: Secondary | ICD-10-CM | POA: Insufficient documentation

## 2023-07-24 DIAGNOSIS — F319 Bipolar disorder, unspecified: Secondary | ICD-10-CM | POA: Insufficient documentation

## 2023-07-24 DIAGNOSIS — M5412 Radiculopathy, cervical region: Secondary | ICD-10-CM | POA: Insufficient documentation

## 2023-07-24 DIAGNOSIS — Z79899 Other long term (current) drug therapy: Secondary | ICD-10-CM | POA: Insufficient documentation

## 2023-07-24 HISTORY — PX: ANTERIOR CERVICAL DECOMP/DISCECTOMY FUSION: SHX1161

## 2023-07-24 SURGERY — ANTERIOR CERVICAL DECOMPRESSION/DISCECTOMY FUSION 1 LEVEL
Anesthesia: General

## 2023-07-24 MED ORDER — FENTANYL CITRATE (PF) 250 MCG/5ML IJ SOLN
INTRAMUSCULAR | Status: AC
  Filled 2023-07-24: qty 5

## 2023-07-24 MED ORDER — SUGAMMADEX SODIUM 200 MG/2ML IV SOLN
INTRAVENOUS | Status: DC | PRN
Start: 2023-07-24 — End: 2023-07-24
  Administered 2023-07-24: 200 mg via INTRAVENOUS
  Administered 2023-07-24: 100 mg via INTRAVENOUS

## 2023-07-24 MED ORDER — 0.9 % SODIUM CHLORIDE (POUR BTL) OPTIME
TOPICAL | Status: DC | PRN
  Administered 2023-07-24: 1000 mL

## 2023-07-24 MED ORDER — ROCURONIUM BROMIDE 10 MG/ML (PF) SYRINGE
PREFILLED_SYRINGE | INTRAVENOUS | Status: DC | PRN
  Administered 2023-07-24: 60 mg via INTRAVENOUS

## 2023-07-24 MED ORDER — POVIDONE-IODINE 7.5 % EX SOLN
Freq: Once | CUTANEOUS | Status: DC
Start: 2023-07-24 — End: 2023-07-24
  Filled 2023-07-24: qty 118

## 2023-07-24 MED ORDER — CHLORHEXIDINE GLUCONATE 0.12 % MT SOLN
15.0000 mL | Freq: Once | OROMUCOSAL | Status: AC
  Filled 2023-07-24: qty 15

## 2023-07-24 MED ORDER — ORAL CARE MOUTH RINSE: 15.0000 mL | Freq: Once | OROMUCOSAL | Status: AC

## 2023-07-24 MED ORDER — ONDANSETRON HCL 4 MG/2ML IJ SOLN: 4.0000 mg | Freq: Four times a day (QID) | INTRAMUSCULAR | Status: DC | PRN

## 2023-07-24 MED ORDER — HYDROCODONE-ACETAMINOPHEN 5-325 MG PO TABS
1.0000 | ORAL_TABLET | Freq: Four times a day (QID) | ORAL | 0 refills | Status: DC | PRN
  Filled 2023-07-24: qty 20, 5d supply, fill #0

## 2023-07-24 MED ORDER — KETAMINE HCL 10 MG/ML IJ SOLN
INTRAMUSCULAR | Status: DC | PRN
  Administered 2023-07-24: 20 mg via INTRAVENOUS

## 2023-07-24 MED ORDER — DIPHENHYDRAMINE HCL 50 MG/ML IJ SOLN
INTRAMUSCULAR | Status: DC | PRN
  Administered 2023-07-24: 12.5 mg via INTRAVENOUS

## 2023-07-24 MED ORDER — THROMBIN 20000 UNITS EX SOLR
CUTANEOUS | Status: DC | PRN
  Administered 2023-07-24: 20 mL

## 2023-07-24 MED ORDER — BUPIVACAINE-EPINEPHRINE (PF) 0.25% -1:200000 IJ SOLN
INTRAMUSCULAR | Status: AC
  Filled 2023-07-24: qty 30

## 2023-07-24 MED ORDER — CEFAZOLIN SODIUM-DEXTROSE 2-4 GM/100ML-% IV SOLN
2.0000 g | INTRAVENOUS | Status: AC
  Administered 2023-07-24: 2 g via INTRAVENOUS
  Filled 2023-07-24: qty 100

## 2023-07-24 MED ORDER — PHENYLEPHRINE 80 MCG/ML (10ML) SYRINGE FOR IV PUSH (FOR BLOOD PRESSURE SUPPORT)
PREFILLED_SYRINGE | INTRAVENOUS | Status: AC
  Filled 2023-07-24: qty 10

## 2023-07-24 MED ORDER — AMISULPRIDE (ANTIEMETIC) 5 MG/2ML IV SOLN: 10.0000 mg | Freq: Once | INTRAVENOUS | Status: DC | PRN

## 2023-07-24 MED ORDER — FENTANYL CITRATE (PF) 250 MCG/5ML IJ SOLN
INTRAMUSCULAR | Status: DC | PRN
  Administered 2023-07-24: 100 ug via INTRAVENOUS
  Administered 2023-07-24: 50 ug via INTRAVENOUS

## 2023-07-24 MED ORDER — KETAMINE HCL 50 MG/5ML IJ SOSY
PREFILLED_SYRINGE | INTRAMUSCULAR | Status: AC
  Filled 2023-07-24: qty 5

## 2023-07-24 MED ORDER — HYDROMORPHONE HCL 1 MG/ML IJ SOLN: 0.2500 mg | INTRAMUSCULAR | Status: DC | PRN

## 2023-07-24 MED ORDER — MIDAZOLAM HCL 2 MG/2ML IJ SOLN
INTRAMUSCULAR | Status: DC | PRN
  Administered 2023-07-24: 2 mg via INTRAVENOUS

## 2023-07-24 MED ORDER — HYDROCODONE-ACETAMINOPHEN 7.5-325 MG PO TABS: 1.0000 | ORAL_TABLET | Freq: Once | ORAL | Status: DC | PRN

## 2023-07-24 MED ORDER — ACETAMINOPHEN 500 MG PO TABS
1000.0000 mg | ORAL_TABLET | Freq: Once | ORAL | Status: AC
  Administered 2023-07-24: 1000 mg via ORAL
  Filled 2023-07-24: qty 2

## 2023-07-24 MED ORDER — MEPERIDINE HCL 25 MG/ML IJ SOLN: 6.2500 mg | INTRAMUSCULAR | Status: DC | PRN

## 2023-07-24 MED ORDER — THROMBIN 20000 UNITS EX SOLR
CUTANEOUS | Status: AC
  Filled 2023-07-24: qty 20000

## 2023-07-24 MED ORDER — PHENYLEPHRINE 80 MCG/ML (10ML) SYRINGE FOR IV PUSH (FOR BLOOD PRESSURE SUPPORT)
PREFILLED_SYRINGE | INTRAVENOUS | Status: DC | PRN
  Administered 2023-07-24: 80 ug via INTRAVENOUS

## 2023-07-24 MED ORDER — ROCURONIUM BROMIDE 10 MG/ML (PF) SYRINGE
PREFILLED_SYRINGE | INTRAVENOUS | Status: AC
  Filled 2023-07-24: qty 10

## 2023-07-24 MED ORDER — HYDROMORPHONE HCL 1 MG/ML IJ SOLN
INTRAMUSCULAR | Status: AC
  Filled 2023-07-24: qty 0.5

## 2023-07-24 MED ORDER — ONDANSETRON HCL 4 MG/2ML IJ SOLN: 4.0000 mg | Freq: Once | INTRAMUSCULAR | Status: DC | PRN

## 2023-07-24 MED ORDER — LIDOCAINE 2% (20 MG/ML) 5 ML SYRINGE
INTRAMUSCULAR | Status: AC
  Filled 2023-07-24: qty 5

## 2023-07-24 MED ORDER — METHOCARBAMOL 500 MG PO TABS
500.0000 mg | ORAL_TABLET | Freq: Four times a day (QID) | ORAL | 0 refills | Status: DC
  Filled 2023-07-24: qty 60, 8d supply, fill #0

## 2023-07-24 MED ORDER — CHLORHEXIDINE GLUCONATE 0.12 % MT SOLN
OROMUCOSAL | Status: AC
Start: 2023-07-24 — End: 2023-07-24
  Administered 2023-07-24: 15 mL via OROMUCOSAL
  Filled 2023-07-24: qty 15

## 2023-07-24 MED ORDER — DEXMEDETOMIDINE HCL IN NACL 80 MCG/20ML IV SOLN
INTRAVENOUS | Status: DC | PRN
  Administered 2023-07-24: 4 ug via INTRAVENOUS
  Administered 2023-07-24: 8 ug via INTRAVENOUS

## 2023-07-24 MED ORDER — DEXAMETHASONE SODIUM PHOSPHATE 10 MG/ML IJ SOLN
INTRAMUSCULAR | Status: DC | PRN
  Administered 2023-07-24: 10 mg via INTRAVENOUS

## 2023-07-24 MED ORDER — PROPOFOL 10 MG/ML IV BOLUS
INTRAVENOUS | Status: DC | PRN
  Administered 2023-07-24: 200 mg via INTRAVENOUS

## 2023-07-24 MED ORDER — ONDANSETRON HCL 4 MG/2ML IJ SOLN
INTRAMUSCULAR | Status: DC | PRN
  Administered 2023-07-24: 4 mg via INTRAVENOUS

## 2023-07-24 MED ORDER — LIDOCAINE 2% (20 MG/ML) 5 ML SYRINGE
INTRAMUSCULAR | Status: DC | PRN
  Administered 2023-07-24: 100 mg via INTRAVENOUS

## 2023-07-24 MED ORDER — PHENYLEPHRINE HCL-NACL 20-0.9 MG/250ML-% IV SOLN
INTRAVENOUS | Status: DC | PRN
  Administered 2023-07-24: 15 ug/min via INTRAVENOUS

## 2023-07-24 MED ORDER — MIDAZOLAM HCL 2 MG/2ML IJ SOLN
INTRAMUSCULAR | Status: AC
  Filled 2023-07-24: qty 2

## 2023-07-24 MED ORDER — BUPIVACAINE-EPINEPHRINE 0.25% -1:200000 IJ SOLN
INTRAMUSCULAR | Status: DC | PRN
  Administered 2023-07-24: 9 mL

## 2023-07-24 MED ORDER — LACTATED RINGERS IV SOLN: INTRAVENOUS | Status: DC

## 2023-07-24 MED ORDER — HYDROMORPHONE HCL 1 MG/ML IJ SOLN
INTRAMUSCULAR | Status: DC | PRN
  Administered 2023-07-24: .5 mg via INTRAVENOUS

## 2023-07-24 MED ORDER — PROPOFOL 10 MG/ML IV BOLUS
INTRAVENOUS | Status: AC
  Filled 2023-07-24: qty 20

## 2023-07-24 SURGICAL SUPPLY — 64 items
BAG COUNTER SPONGE SURGICOUNT (BAG) ×1 IMPLANT
BENZOIN TINCTURE PRP APPL 2/3 (GAUZE/BANDAGES/DRESSINGS) ×1 IMPLANT
BIT DRILL NEURO 2X3.1 SFT TUCH (MISCELLANEOUS) ×1 IMPLANT
BIT DRILL SKYLINE 12MM (BIT) IMPLANT
BLADE CLIPPER SURG (BLADE) ×1 IMPLANT
BLADE SURG 15 STRL LF DISP TIS (BLADE) ×1 IMPLANT
BONE VIVIGEN FORMABLE 1.3CC (Bone Implant) ×1 IMPLANT
CORD BIPOLAR FORCEPS 12FT (ELECTRODE) ×1 IMPLANT
COVER SURGICAL LIGHT HANDLE (MISCELLANEOUS) ×1 IMPLANT
DEVICE ENDSKLTN IMPLANT SM 7MM (Cage) IMPLANT
DRAIN JACKSON RD 7FR 3/32 (WOUND CARE) IMPLANT
DRAPE C-ARM 42X72 X-RAY (DRAPES) ×1 IMPLANT
DRAPE POUCH INSTRU U-SHP 10X18 (DRAPES) ×1 IMPLANT
DRAPE SURG 17X23 STRL (DRAPES) ×3 IMPLANT
DRILL NEURO 2X3.1 SOFT TOUCH (MISCELLANEOUS) ×1 IMPLANT
DURAPREP 26ML APPLICATOR (WOUND CARE) ×1 IMPLANT
ELECT COATED BLADE 2.86 ST (ELECTRODE) ×1 IMPLANT
ELECT REM PT RETURN 9FT ADLT (ELECTROSURGICAL) ×1 IMPLANT
ELECTRODE REM PT RTRN 9FT ADLT (ELECTROSURGICAL) ×1 IMPLANT
ENDOSKELETON IMPLANT SM 7MM (Cage) ×1 IMPLANT
EVACUATOR SILICONE 100CC (DRAIN) IMPLANT
GAUZE 4X4 16PLY ~~LOC~~+RFID DBL (SPONGE) ×1 IMPLANT
GAUZE SPONGE 4X4 12PLY STRL (GAUZE/BANDAGES/DRESSINGS) ×1 IMPLANT
GLOVE BIO SURGEON STRL SZ 6.5 (GLOVE) ×1 IMPLANT
GLOVE BIO SURGEON STRL SZ8 (GLOVE) ×1 IMPLANT
GLOVE BIOGEL PI IND STRL 7.0 (GLOVE) ×2 IMPLANT
GLOVE BIOGEL PI IND STRL 8 (GLOVE) ×1 IMPLANT
GLOVE SURG ENC MOIS LTX SZ6.5 (GLOVE) ×1 IMPLANT
GOWN STRL REUS W/ TWL LRG LVL3 (GOWN DISPOSABLE) ×1 IMPLANT
GOWN STRL REUS W/ TWL XL LVL3 (GOWN DISPOSABLE) ×1 IMPLANT
GRAFT BNE MATRIX VG FRMBL SM 1 (Bone Implant) IMPLANT
IV CATH 14GX2 1/4 (CATHETERS) ×1 IMPLANT
KIT BASIN OR (CUSTOM PROCEDURE TRAY) ×1 IMPLANT
KIT TURNOVER KIT B (KITS) ×1 IMPLANT
MANIFOLD NEPTUNE II (INSTRUMENTS) ×1 IMPLANT
NDL PRECISIONGLIDE 27X1.5 (NEEDLE) ×1 IMPLANT
NDL SPNL 20GX3.5 QUINCKE YW (NEEDLE) ×1 IMPLANT
NEEDLE PRECISIONGLIDE 27X1.5 (NEEDLE) ×1 IMPLANT
NEEDLE SPNL 20GX3.5 QUINCKE YW (NEEDLE) ×1 IMPLANT
NS IRRIG 1000ML POUR BTL (IV SOLUTION) ×1 IMPLANT
PACK ORTHO CERVICAL (CUSTOM PROCEDURE TRAY) ×1 IMPLANT
PAD ARMBOARD POSITIONER FOAM (MISCELLANEOUS) ×2 IMPLANT
PATTIES SURGICAL .5 X.5 (GAUZE/BANDAGES/DRESSINGS) IMPLANT
PATTIES SURGICAL .5 X1 (DISPOSABLE) IMPLANT
PIN DSTRCT 12XNS SS ACIS (PIN) IMPLANT
PLATE SKYLINE 12MM (Plate) IMPLANT
POSITIONER HEAD DONUT 9IN (MISCELLANEOUS) ×1 IMPLANT
SCREW SKYLINE VARIABLE LG (Screw) IMPLANT
SCREW VARIABLE SELF TAP 12MM (Screw) IMPLANT
SPONGE INTESTINAL PEANUT (DISPOSABLE) ×1 IMPLANT
SPONGE SURGIFOAM ABS GEL 100 (HEMOSTASIS) IMPLANT
STRIP CLOSURE SKIN 1/2X4 (GAUZE/BANDAGES/DRESSINGS) ×1 IMPLANT
SURGIFLO W/THROMBIN 8M KIT (HEMOSTASIS) IMPLANT
SUT MNCRL AB 4-0 PS2 18 (SUTURE) ×1 IMPLANT
SUT SILK 4-0 18XBRD TIE 12 (SUTURE) IMPLANT
SUT VIC AB 2-0 CT2 18 VCP726D (SUTURE) ×1 IMPLANT
SYR BULB IRRIG 60ML STRL (SYRINGE) ×1 IMPLANT
SYR CONTROL 10ML LL (SYRINGE) ×2 IMPLANT
TAPE CLOTH 4X10 WHT NS (GAUZE/BANDAGES/DRESSINGS) ×1 IMPLANT
TAPE UMBILICAL 1/8X30 (MISCELLANEOUS) ×2 IMPLANT
TOWEL GREEN STERILE (TOWEL DISPOSABLE) ×1 IMPLANT
TOWEL GREEN STERILE FF (TOWEL DISPOSABLE) ×1 IMPLANT
WATER STERILE IRR 1000ML POUR (IV SOLUTION) ×1 IMPLANT
YANKAUER SUCT BULB TIP NO VENT (SUCTIONS) ×1 IMPLANT

## 2023-07-24 NOTE — H&P (Signed)
 PREOPERATIVE H&P  Chief Complaint: Left arm pain  HPI: Bethany [name] is a 48 y.o. female who presents with ongoing pain in the left arm  MRI reveals stenosis at C4/5   Patient has failed multiple forms of conservative care and continues to have pain (see office notes for additional details regarding the patient's full course of treatment)  Past Medical History:  Diagnosis Date   Anemia    Anxiety    Bipolar disorder (HCC)    Bipolar 1   COVID-19    Depression    Family history of breast cancer 06/02/2018   Fibrocystic disease of both breasts    GERD (gastroesophageal reflux disease)    History of cervical dysplasia    laser surgery   Hyperglycemia 10/14/2020   Hypokalemia 12/03/2021   Insomnia    Left breast mass 06/30/2008   Low blood sugar 2001   pt checks BS in AM, controls with diet   Need for immunization against influenza 01/12/2022   Routine cervical smear 06/20/2020   Routine health maintenance 05/09/2014   Routine screening for STI (sexually transmitted infection) 01/12/2022   S/P mastectomy, bilateral 10/27/2018   Surgery follow-up 10/19/2018   UTI (urinary tract infection) 11/01/2020   Vaginal Pap smear, abnormal    Past Surgical History:  Procedure Laterality Date   AUGMENTATION MAMMAPLASTY Bilateral    BREAST FIBROADENOMA SURGERY  2011, 2008   benign    BREAST IMPLANT REMOVAL Bilateral 10/31/2022   Procedure: REMOVAL BREAST IMPLANTS;  Surgeon: Peggye Form, DO;  Location: [facility];  Service: Plastics;  Laterality: Bilateral;   BREAST RECONSTRUCTION WITH PLACEMENT OF TISSUE EXPANDER AND FLEX HD (ACELLULAR HYDRATED DERMIS) Bilateral 10/19/2018   Procedure: BREAST RECONSTRUCTION WITH PLACEMENT OF TISSUE EXPANDER AND FLEX HD (ACELLULAR HYDRATED DERMIS);  Surgeon: Peggye Form, DO;  Location: [facility];  Service: Plastics;  Laterality: Bilateral;   CESAREAN SECTION  04/22/1993   HYSTEROSCOPY WITH NOVASURE N/A 01/02/2016    Procedure: NOVASURE;  Surgeon: Adam Phenix, MD;  Location: [facility];  Service: Gynecology;  Laterality: N/A;   LIPOSUCTION WITH LIPOFILLING Bilateral 05/07/2023   Procedure: Excision of bilateral breast scar contracture and lipo filling bilateral breasts;  Surgeon: Peggye Form, DO;  Location: [facility];  Service: Plastics;  Laterality: Bilateral;   MASTECTOMY Bilateral 2019   NIPPLE SPARING MASTECTOMY Bilateral 10/19/2018   Procedure: BILATERAL NIPPLE SPARING MASTECTOMY;  Surgeon: Griselda Miner, MD;  Location: [facility];  Service: General;  Laterality: Bilateral;   REMOVAL OF BILATERAL TISSUE EXPANDERS WITH PLACEMENT OF BILATERAL BREAST IMPLANTS Bilateral 12/30/2018   Procedure: REMOVAL OF BILATERAL TISSUE EXPANDERS WITH PLACEMENT OF BILATERAL BREAST IMPLANTS;  Surgeon: Peggye Form, DO;  Location: [facility];  Service: Plastics;  Laterality: Bilateral;   TUBAL LIGATION  04/22/2001   Social History   Socioeconomic History   Marital status: Divorced    Spouse name: Not on file   Number of children: 3   Years of education: Not on file   Highest education level: Associate degree: occupational, Scientist, product/process development, or vocational program  Occupational History   Not on file  Tobacco Use   Smoking status: Never    Passive exposure: Never   Smokeless tobacco: Never  Vaping Use   Vaping status: Never Used  Substance and Sexual Activity   Alcohol use: No    Alcohol/week: 0.0 standard drinks of alcohol   Drug use: No   Sexual activity: Yes  Partners: Male    Birth control/protection: Surgical  Other Topics Concern   Not on file  Social History Narrative   Divorced, 3 daughters.  Works as a Lawyer at [employer] @ [employer]   Social Drivers of Corporate investment banker Strain: High Risk (02/14/2023)   Overall Financial Resource Strain (CARDIA)    Difficulty of Paying Living Expenses: Very hard  Food Insecurity: Food Insecurity  Present (02/14/2023)   Hunger Vital Sign    Worried About Running Out of Food in the Last Year: Often true    Ran Out of Food in the Last Year: Often true  Transportation Needs: No Transportation Needs (02/14/2023)   PRAPARE - Administrator, Civil Service (Medical): No    Lack of Transportation (Non-Medical): No  Physical Activity: Insufficiently Active (02/14/2023)   Exercise Vital Sign    Days of Exercise per Week: 4 days    Minutes of Exercise per Session: 30 min  Stress: Stress Concern Present (02/14/2023)   [employer] of Occupational Health - Occupational Stress Questionnaire    Feeling of Stress : Rather much  Social Connections: Socially Isolated (02/14/2023)   Social Connection and Isolation Panel [NHANES]    Frequency of Communication with Friends and Family: Once a week    Frequency of Social Gatherings with Friends and Family: Never    Attends Religious Services: Never    Database administrator or Organizations: Yes    Attends Engineer, structural: Patient declined    Marital Status: Divorced   Family History  Problem Relation Age of Onset   Anxiety disorder Father    Depression Father    Diabetes Maternal Grandmother    Hypertension Paternal Grandfather    Cancer Maternal Aunt        breast   Breast cancer Maternal Aunt    Alcohol abuse Maternal Uncle    Alcohol abuse Cousin    Drug abuse Cousin    Stroke Neg Hx    Allergies  Allergen Reactions   Penicillins Hives and Nausea And Vomiting   Tapentadol Hives and Other (See Comments)    Name brand is NUCYNTA    Lamictal [Lamotrigine] Rash   Codeine Hives   Oxycodone Hcl Hives   Prior to Admission medications   Medication Sig Start Date End Date Taking? Authorizing Provider  carboxymethylcellulose (REFRESH PLUS) 0.5 % SOLN Place 1 drop into both eyes 2 (two) times daily as needed (dry eyes).   Yes [provider]  linaclotide (LINZESS) 145 MCG CAPS capsule TAKE 1 CAPSULE  BY MOUTH EVERY DAY BEFORE BREAKFAST - INSURANCE WILL PAY ON 6/25 04/30/23  Yes Mabe, Earvin Hansen, MD  lithium carbonate (ESKALITH) 450 MG ER tablet Take 1 tablet (450 mg total) by mouth 3 (three) times daily. 07/07/23 09/05/23 Yes Arfeen, Phillips Grout, MD  naproxen (NAPROSYN) 500 MG tablet Take 1 tablet (500 mg total) by mouth 2 (two) times daily as needed. 03/10/23  Yes Christella Hartigan, Bret C, DO  pantoprazole (PROTONIX) 40 MG tablet TAKE 1 TABLET BY MOUTH EVERY DAY 04/07/23  Yes Mabe, Earvin Hansen, MD  QUEtiapine (SEROQUEL) 300 MG tablet Take 1 tablet (300 mg total) by mouth at bedtime. 07/07/23 09/05/23 Yes Arfeen, Phillips Grout, MD  sertraline (ZOLOFT) 50 MG tablet Take 1.5 tablets (75 mg total) by mouth daily. 07/07/23  Yes Arfeen, Phillips Grout, MD  ondansetron (ZOFRAN) 4 MG tablet Take 1 tablet (4 mg total) by mouth every 8 (eight) hours as needed  for nausea or vomiting. 10/11/22   Scheeler, Kermit Balo, PA-C  promethazine (PHENERGAN) 12.5 MG tablet Take 1 tablet (12.5 mg total) by mouth every 6 (six) hours as needed for nausea or vomiting. 12/30/22   [name], MD     All other systems have been reviewed and were otherwise negative with the exception of those mentioned in the HPI and as above.  Physical Exam: Vitals:   07/24/23 0711  BP: (!) 133/93  Pulse: 80  Resp: 18  Temp: 98.5 F (36.9 C)  SpO2: 99%    Body mass index is 29.18 kg/m.  General: Alert, no acute distress Cardiovascular: No pedal edema Respiratory: No cyanosis, no use of accessory musculature Skin: No lesions in the area of chief complaint Neurologic: Sensation intact distally Psychiatric: Patient is competent for consent with normal mood and affect Lymphatic: No axillary or cervical lymphadenopathy   Assessment/Plan: CERVICAL RADICULOPATHY DUE TO C4/5 SPINAL STENOSIS Plan for Procedure(s): ANTERIOR CERVICAL DECOMPRESSION/DISCECTOMY FUSION WITH INSTRUMENTATION AND ALLOGRAFT 1 LEVEL (C4/5)   Jackelyn Hoehn, MD 07/24/2023 7:47 AM

## 2023-07-24 NOTE — Anesthesia Preprocedure Evaluation (Addendum)
 Anesthesia Evaluation  Patient identified by MRN, date of birth, ID band Patient awake    Reviewed: Allergy & Precautions, NPO status , Patient's Chart, lab work & pertinent test results  Airway Mallampati: III  TM Distance: >3 FB Neck ROM: Full    Dental  (+) Teeth Intact, Dental Advisory Given   Pulmonary neg pulmonary ROS   Pulmonary exam normal breath sounds clear to auscultation       Cardiovascular negative cardio ROS Normal cardiovascular exam Rhythm:Regular Rate:Normal     Neuro/Psych  Headaches PSYCHIATRIC DISORDERS Anxiety Depression Bipolar Disorder      GI/Hepatic Neg liver ROS,GERD  Medicated and Controlled,,  Endo/Other  negative endocrine ROS    Renal/GU negative Renal ROS  negative genitourinary   Musculoskeletal  (+) Arthritis , Osteoarthritis,    Abdominal   Peds  Hematology negative hematology ROS (+)   Anesthesia Other Findings   Reproductive/Obstetrics negative OB ROS                             Anesthesia Physical Anesthesia Plan  ASA: 2  Anesthesia Plan: General   Post-op Pain Management: Tylenol PO (pre-op)*   Induction: Intravenous and Rapid sequence  PONV Risk Score and Plan: 3 and Ondansetron, Dexamethasone, Midazolam, Treatment may vary due to age or medical condition and Diphenhydramine  Airway Management Planned: Oral ETT and Video Laryngoscope Planned  Additional Equipment: None  Intra-op Plan:   Post-operative Plan: Extubation in OR  Informed Consent: I have reviewed the patients History and Physical, chart, labs and discussed the procedure including the risks, benefits and alternatives for the proposed anesthesia with the patient or authorized representative who has indicated his/her understanding and acceptance.     Dental advisory given  Plan Discussed with: CRNA  Anesthesia Plan Comments: (Nauseous/vomiting in preop from clear ensure,  will RSI Takes benadryl w/ all narcotics d/t itching)       Anesthesia Quick Evaluation

## 2023-07-24 NOTE — Anesthesia Postprocedure Evaluation (Signed)
 Anesthesia Post Note  Patient: Bethany [name]  Procedure(s) Performed: ANTERIOR CERVICAL DECOMPRESSION/DISCECTOMY FUSION 1 LEVEL     Patient location during evaluation: PACU Anesthesia Type: General Level of consciousness: awake and alert, oriented and patient cooperative Pain management: pain level controlled Vital Signs Assessment: post-procedure vital signs reviewed and stable Respiratory status: spontaneous breathing, nonlabored ventilation and respiratory function stable Cardiovascular status: blood pressure returned to baseline and stable Postop Assessment: no apparent nausea or vomiting Anesthetic complications: no   There were no known notable events for this encounter.  Last Vitals:  Vitals:   07/24/23 1315 07/24/23 1330  BP: (!) 134/92 132/81  Pulse: 84 83  Resp: 12 12  Temp:    SpO2: 93% 93%    Last Pain:  Vitals:   07/24/23 1315  TempSrc:   PainSc: Asleep    LLE Motor Response: Purposeful movement (07/24/23 1330) LLE Sensation: Full sensation (07/24/23 1330) RLE Motor Response: Purposeful movement (07/24/23 1330) RLE Sensation: Full sensation (07/24/23 1330)      Lannie Fields

## 2023-07-24 NOTE — Anesthesia Procedure Notes (Signed)
 Procedure Name: Intubation Date/Time: 07/24/2023 10:46 AM  Performed by: Ardean Larsen, CRNAPre-anesthesia Checklist: Patient identified, Emergency Drugs available, Suction available and Patient being monitored Patient Re-evaluated:Patient Re-evaluated prior to induction Oxygen Delivery Method: Circle System Utilized Preoxygenation: Pre-oxygenation with 100% oxygen Induction Type: IV induction Ventilation: Mask ventilation without difficulty Laryngoscope Size: Glidescope Grade View: Grade I Tube type: Oral Tube size: 7.0 mm Number of attempts: 1 Airway Equipment and Method: Stylet Placement Confirmation: ETT inserted through vocal cords under direct vision, positive ETCO2 and breath sounds checked- equal and bilateral Secured at: 21 cm Tube secured with: Tape Dental Injury: Teeth and Oropharynx as per pre-operative assessment  Comments: Airway by Sherron Flemings Head maintained in neutral position

## 2023-07-24 NOTE — Op Note (Signed)
 PATIENT NAME: Bethany [name]   MEDICAL RECORD NO.:   [id number]    DATE OF BIRTH: Jul 20, 1975   DATE OF PROCEDURE: 07/24/2023                               OPERATIVE REPORT     PREOPERATIVE DIAGNOSES: 1. Left-sided cervical radiculopathy 2. 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 Left-sided cervical radiculopathy 2. 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 Anterior cervical decompression and fusion C4/5 2. Placement of anterior instrumentation, C4/5 (of note, the plate and screws were separate from, and not integral to, the intervertebral spacer) 3. Insertion of interbody device x1 (7mm Titan intervertebral spacer). 4. Intraoperative use of fluoroscopy. 5. Use of morselized allograft - ViviGen.   SURGEON:  Estill Bamberg, MD   ASSISTANT:  Jason Coop, PA-C.   ANESTHESIA:  General endotracheal anesthesia.   COMPLICATIONS:  None.   DISPOSITION:  Stable.   ESTIMATED BLOOD LOSS:  Minimal.   INDICATIONS FOR SURGERY:  Briefly, Bethany [name]  is a pleasant 48 -year- old female, who did present to me with severe pain in his neck and left arm.   The patient's MRI did reveal the findings noted above.  Given the ongoing pain and lack of improvement with appropriate treatment measures, we did discuss proceeding with the procedure noted above.  The patient was fully aware of the risks and limitations of surgery as outlined in my preoperative note.   OPERATIVE DETAILS:  On 07/24/2023, the patient was brought to surgery and general endotracheal anesthesia was administered.  The patient was placed supine on the hospital bed. The neck was gently extended.  All bony prominences were meticulously padded.  The neck was prepped and draped in the usual sterile fashion.  At this point, I did make a left-sided transverse incision.  The platysma was incised.  A Smith-Robinson approach was used and the anterior spine was identified. A self-retaining retractor was placed.   I then subperiosteally exposed the vertebral bodies from C4-C5.  Caspar pins were then placed into the C4 and C5 vertebral bodies and distraction was applied.  A thorough and complete C4-5 intervertebral diskectomy was performed.  The posterior longitudinal ligament was identified and entered using a nerve hook.  I then used #1 followed by #2 Kerrison to perform a thorough and complete intervertebral diskectomy.  The spinal canal was thoroughly decompressed, as was the left neuroforamen.  The endplates were then prepared and the appropriate-sized intervertebral spacer was then packed with ViviGen and tamped into position in the usual fashion. The Caspar pins  then were removed and bone wax was placed in their place.  The appropriate-sized anterior cervical plate was placed over the anterior spine.  12 mm variable angle screws were placed, 2 in each vertebral body from C4-C5 for a total of 4 vertebral body screws.  The screws were then locked to the plate using the Cam locking mechanism.  I was very pleased with the final fluoroscopic images.  The wound was then irrigated.  The wound was then explored for any undue bleeding and there was no bleeding noted. The wound was then closed in layers using 2-0 Vicryl, followed by 4-0 Monocryl.  Benzoin and Steri-Strips were applied, followed by sterile dressing.  All instrument counts were correct at the termination of the procedure.   Of note, Jason Coop, PA-C, was my assistant throughout surgery, and did aid in retraction,  placement of the hardware, suctioning, and closure from start to finish.     Estill Bamberg, MD

## 2023-07-24 NOTE — Transfer of Care (Signed)
 Immediate Anesthesia Transfer of Care Note  Patient: Bethany [name]  Procedure(s) Performed: ANTERIOR CERVICAL DECOMPRESSION/DISCECTOMY FUSION 1 LEVEL  Patient Location: PACU  Anesthesia Type:General  Level of Consciousness: sedated  Airway & Oxygen Therapy: Patient Spontanous Breathing  Post-op Assessment: Report given to RN  Post vital signs: Reviewed and stable  Last Vitals:  Vitals Value Taken Time  BP 131/78 07/24/23 1223  Temp    Pulse 76 07/24/23 1227  Resp 9 07/24/23 1227  SpO2 96 % 07/24/23 1227  Vitals shown include unfiled device data.  Last Pain:  Vitals:   07/24/23 0844  TempSrc:   PainSc: 7       Patients Stated Pain Goal: 4 (07/24/23 1610)  Complications: There were no known notable events for this encounter.

## 2023-07-24 NOTE — Progress Notes (Signed)
 Called to room by nurse tech stating patient was vomiting. Found patient had vomited the pre-surgical Ensure she drank at home this morning. Patient stated she felt better afterwards and thinks it was just from the Ensure. Dr. Yevette Edwards and anesthesia made aware. Anesthesia ordered Zofran prn.

## 2023-07-25 ENCOUNTER — Other Ambulatory Visit (HOSPITAL_COMMUNITY): Payer: Self-pay

## 2023-07-25 ENCOUNTER — Encounter (HOSPITAL_COMMUNITY): Payer: Self-pay | Admitting: Orthopedic Surgery

## 2023-07-29 ENCOUNTER — Other Ambulatory Visit (HOSPITAL_COMMUNITY): Payer: Self-pay | Admitting: Psychiatry

## 2023-07-29 DIAGNOSIS — F3160 Bipolar disorder, current episode mixed, unspecified: Secondary | ICD-10-CM

## 2023-07-29 DIAGNOSIS — G4700 Insomnia, unspecified: Secondary | ICD-10-CM

## 2023-08-07 ENCOUNTER — Ambulatory Visit (INDEPENDENT_AMBULATORY_CARE_PROVIDER_SITE_OTHER): Payer: MEDICAID | Admitting: Clinical

## 2023-08-07 ENCOUNTER — Encounter (HOSPITAL_COMMUNITY): Payer: Self-pay | Admitting: Clinical

## 2023-08-07 DIAGNOSIS — F319 Bipolar disorder, unspecified: Secondary | ICD-10-CM

## 2023-08-07 DIAGNOSIS — F4310 Post-traumatic stress disorder, unspecified: Secondary | ICD-10-CM
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
## 2023-08-19 ENCOUNTER — Encounter: Payer: Self-pay | Admitting: Emergency Medicine

## 2023-08-19 ENCOUNTER — Other Ambulatory Visit: Payer: Self-pay

## 2023-08-19 ENCOUNTER — Ambulatory Visit
Admission: EM | Admit: 2023-08-19 | Discharge: 2023-08-19 | Disposition: A | Discharge status: Home / Self Care | Payer: MEDICAID | Attending: Family Medicine | Admitting: Family Medicine

## 2023-08-19 DIAGNOSIS — Z23 Encounter for immunization: Secondary | ICD-10-CM

## 2023-08-19 DIAGNOSIS — S61219A Laceration without foreign body of unspecified finger without damage to nail, initial encounter: Secondary | ICD-10-CM

## 2023-08-19 MED ORDER — TETANUS-DIPHTH-ACELL PERTUSSIS 5-2.5-18.5 LF-MCG/0.5 IM SUSY
0.5000 mL | PREFILLED_SYRINGE | Freq: Once | INTRAMUSCULAR | Status: AC
  Administered 2023-08-19: 0.5 mL via INTRAMUSCULAR
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
## 2023-08-19 NOTE — ED Triage Notes (Signed)
 Pt here for laceration to left index finger after cutting with scissors; bleeding controlled at present

## 2023-08-21 ENCOUNTER — Encounter (HOSPITAL_COMMUNITY): Payer: Self-pay | Admitting: Clinical

## 2023-08-21 ENCOUNTER — Ambulatory Visit (HOSPITAL_COMMUNITY): Payer: MEDICAID | Admitting: Clinical

## 2023-08-21 DIAGNOSIS — F319 Bipolar disorder, unspecified: Secondary | ICD-10-CM | POA: Diagnosis not present

## 2023-08-21 DIAGNOSIS — F4310 Post-traumatic stress disorder, unspecified: Secondary | ICD-10-CM | POA: Diagnosis not present
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
## 2023-08-28 ENCOUNTER — Other Ambulatory Visit (HOSPITAL_COMMUNITY): Payer: Self-pay | Admitting: Psychiatry

## 2023-08-28 DIAGNOSIS — F3160 Bipolar disorder, current episode mixed, unspecified: Secondary | ICD-10-CM

## 2023-09-01 ENCOUNTER — Telehealth (HOSPITAL_COMMUNITY): Payer: MEDICAID | Admitting: Psychiatry

## 2023-09-01 ENCOUNTER — Telehealth: Payer: Self-pay | Admitting: Family Medicine

## 2023-09-01 ENCOUNTER — Encounter (HOSPITAL_COMMUNITY): Payer: Self-pay | Admitting: Psychiatry

## 2023-09-01 VITALS — Wt 155.0 lb

## 2023-09-01 DIAGNOSIS — G4700 Insomnia, unspecified: Secondary | ICD-10-CM

## 2023-09-01 DIAGNOSIS — F4310 Post-traumatic stress disorder, unspecified: Secondary | ICD-10-CM | POA: Diagnosis not present

## 2023-09-01 DIAGNOSIS — F3160 Bipolar disorder, current episode mixed, unspecified: Secondary | ICD-10-CM | POA: Diagnosis not present

## 2023-09-01 DIAGNOSIS — D696 Thrombocytopenia, unspecified: Secondary | ICD-10-CM

## 2023-09-01 DIAGNOSIS — E7800 Pure hypercholesterolemia, unspecified: Secondary | ICD-10-CM

## 2023-09-01 DIAGNOSIS — R7989 Other specified abnormal findings of blood chemistry: Secondary | ICD-10-CM

## 2023-09-01 DIAGNOSIS — G723 Periodic paralysis: Secondary | ICD-10-CM

## 2023-09-01 MED ORDER — QUETIAPINE FUMARATE 25 MG PO TABS: 25.0000 mg | ORAL_TABLET | Freq: Two times a day (BID) | ORAL | 1 refills | Status: DC

## 2023-09-01 MED ORDER — SERTRALINE HCL 50 MG PO TABS: 75.0000 mg | ORAL_TABLET | Freq: Every day | ORAL | 1 refills | Status: DC

## 2023-09-01 MED ORDER — LITHIUM CARBONATE ER 450 MG PO TBCR: 450.0000 mg | EXTENDED_RELEASE_TABLET | Freq: Three times a day (TID) | ORAL | 1 refills | Status: DC

## 2023-09-01 MED ORDER — QUETIAPINE FUMARATE 300 MG PO TABS
300.0000 mg | ORAL_TABLET | Freq: Every day | ORAL | 1 refills | Status: DC
Start: 2023-09-01 — End: 2023-10-27

## 2023-09-01 NOTE — Progress Notes (Signed)
 [facility] MD Virtual Progress Note   Patient Location: Home Provider Location: Home Office  I connect with patient by video and verified that I am speaking with correct person by using two identifiers. I discussed the limitations of evaluation and management by telemedicine and the availability of in person appointments. I also discussed with the patient that there may be a patient responsible charge related to this service. The patient expressed understanding and agreed to proceed.  Bethany [name] [id number] 48 y.o.  09/01/2023 11:16 AM  History of Present Illness:  Patient is evaluated by video session.  She admitted last stress because car dealer did not know about her car and that really made her very sad and she finally find out that car was at [facility] parking lot which was moved by [employer] manager.  She reported her car still not fixed and that it really upsets her a lot.  She admitted irritability and occasionally paranoia and hallucination but denies any suicidal thoughts or homicidal thoughts.  Recently she was seen in the emergency room when she accidentally cut her finger when she was making crafts.  She is doing better now.  She is in therapy with Ms. Hilario Lover if she has appointment 2 weeks ago.  She reported recently had procedure for her neck that helped her left arm but is still have a lot of back pain and difficulty walking.  She may need another procedure for her back but not sure about the details.  She admitted not sleeping very well and sometimes takes melatonin.  Increase Seroquel  300 mg last visit and that helps some of her anger and mood swings.  She admitted sometime pain aggravates her mood swings.  She reported still have nightmares and last week was difficult because of her 50 year old death anniversary of the brother.  Patient told she was very sad and upset that did not go to the [facility].  Patient has a good support from her daughters.  She is walking and her  daughter helps.  She has no tremors shakes or any EPS.  She is taking lithium , Zoloft , Seroquel .  She has no tremors or shakes.  We have ordered blood work and her labs are okay other than hemoglobin A1c 6.3.  She admitted weight loss but also increase intake of blood sugar.  She is trying to control her excessive sweet eating.  She denies drinking or using any illegal substances.  Past Psychiatric History: Saw therapist in [facility] after brother killed.  Took Zoloft  and lorazepam  by PCP.  Took Lamictal  but stopped after the rash.  Trazodone  helped but wanted to try something for anxiety and it was switched to hydroxyzine .  No h/o inpatient treatment, suicidal attempt, psychosis or any mania.  H/O irritability, anger and mood swing.    Outpatient Encounter Medications as of 09/01/2023  Medication Sig   Melatonin 10 MG CAPS Take 10 mg by mouth at bedtime as needed.   QUEtiapine  (SEROQUEL ) 25 MG tablet Take 1 tablet (25 mg total) by mouth 2 (two) times daily.   carboxymethylcellulose (REFRESH PLUS) 0.5 % SOLN Place 1 drop into both eyes 2 (two) times daily as needed (dry eyes).   HYDROcodone -acetaminophen  (NORCO/VICODIN) 5-325 MG tablet Take 1 tablet by mouth every 6 (six) hours as needed for moderate pain (pain score 4-6) or severe pain (pain score 7-10). (Patient not taking: Reported on 09/01/2023)   linaclotide  (LINZESS ) 145 MCG CAPS capsule TAKE 1 CAPSULE BY MOUTH EVERY DAY BEFORE BREAKFAST -  INSURANCE WILL PAY ON 6/25   lithium  carbonate (ESKALITH ) 450 MG ER tablet Take 1 tablet (450 mg total) by mouth 3 (three) times daily.   methocarbamol  (ROBAXIN ) 500 MG tablet Take 1-2 tablets (500-1,000 mg total) by mouth 4 (four) times daily.   ondansetron  (ZOFRAN ) 4 MG tablet Take 1 tablet (4 mg total) by mouth every 8 (eight) hours as needed for nausea or vomiting. (Patient not taking: Reported on 09/01/2023)   pantoprazole  (PROTONIX ) 40 MG tablet TAKE 1 TABLET BY MOUTH EVERY DAY   promethazine   (PHENERGAN ) 12.5 MG tablet Take 1 tablet (12.5 mg total) by mouth every 6 (six) hours as needed for nausea or vomiting.   QUEtiapine  (SEROQUEL ) 300 MG tablet Take 1 tablet (300 mg total) by mouth at bedtime.   sertraline  (ZOLOFT ) 50 MG tablet Take 1.5 tablets (75 mg total) by mouth daily.   [DISCONTINUED] lithium  carbonate (ESKALITH ) 450 MG ER tablet Take 1 tablet (450 mg total) by mouth 3 (three) times daily.   [DISCONTINUED] QUEtiapine  (SEROQUEL ) 300 MG tablet Take 1 tablet (300 mg total) by mouth at bedtime.   [DISCONTINUED] sertraline  (ZOLOFT ) 50 MG tablet Take 1.5 tablets (75 mg total) by mouth daily.   No facility-administered encounter medications on file as of 09/01/2023.    Recent Results (from the past 2160 hours)  Lithium  level     Status: None   Collection Time: 07/11/23  9:28 AM  Result Value Ref Range   Lithium  Lvl 1.0 0.5 - 1.2 mmol/L    Comment: A concentration of 0.5-0.8 mmol/L is advised for long-term use; concentrations of up to 1.2 mmol/L may be necessary during acute treatment.                                  Detection Limit = 0.1                           <0.1 indicates None Detected   Hemoglobin A1c     Status: Abnormal   Collection Time: 07/11/23  9:28 AM  Result Value Ref Range   Hgb A1c MFr Bld 6.3 (H) 4.8 - 5.6 %    Comment:          Prediabetes: 5.7 - 6.4          Diabetes: >6.4          Glycemic control for adults with diabetes: <7.0    Est. average glucose Bld gHb Est-mCnc 134 mg/dL  Comprehensive metabolic panel     Status: Abnormal   Collection Time: 07/11/23  9:28 AM  Result Value Ref Range   Glucose 136 (H) 70 - 99 mg/dL   BUN 7 6 - 24 mg/dL   Creatinine, Ser 1.30 0.57 - 1.00 mg/dL   eGFR 79 >86 VH/QIO/9.62   BUN/Creatinine Ratio 8 (L) 9 - 23   Sodium 141 134 - 144 mmol/L   Potassium 4.3 3.5 - 5.2 mmol/L   Chloride 105 96 - 106 mmol/L   CO2 17 (L) 20 - 29 mmol/L   Calcium 9.6 8.7 - 10.2 mg/dL   Total Protein 7.0 6.0 - 8.5 g/dL   Albumin  4.3 3.9 - 4.9 g/dL   Globulin, Total 2.7 1.5 - 4.5 g/dL   Bilirubin Total <9.5 0.0 - 1.2 mg/dL   Alkaline Phosphatase 88 44 - 121 IU/L   AST 23 0 - 40 IU/L  ALT 17 0 - 32 IU/L  CBC with Differential/Platelet     Status: Abnormal   Collection Time: 07/11/23  9:28 AM  Result Value Ref Range   WBC 5.1 3.4 - 10.8 x10E3/uL   RBC 4.43 3.77 - 5.28 x10E6/uL   Hemoglobin 12.3 11.1 - 15.9 g/dL   Hematocrit 16.1 09.6 - 46.6 %   MCV 86 79 - 97 fL   MCH 27.8 26.6 - 33.0 pg   MCHC 32.5 31.5 - 35.7 g/dL   RDW 04.5 40.9 - 81.1 %   Platelets 149 (L) 150 - 450 x10E3/uL   Neutrophils 56 Not Estab. %   Lymphs 34 Not Estab. %   Monocytes 7 Not Estab. %   Eos 2 Not Estab. %   Basos 1 Not Estab. %   Neutrophils Absolute 2.9 1.4 - 7.0 x10E3/uL   Lymphocytes Absolute 1.8 0.7 - 3.1 x10E3/uL   Monocytes Absolute 0.3 0.1 - 0.9 x10E3/uL   EOS (ABSOLUTE) 0.1 0.0 - 0.4 x10E3/uL   Basophils Absolute 0.0 0.0 - 0.2 x10E3/uL   Immature Granulocytes 0 Not Estab. %   Immature Grans (Abs) 0.0 0.0 - 0.1 x10E3/uL  Surgical pcr screen     Status: None   Collection Time: 07/14/23  8:33 AM   Specimen: Nasal Mucosa; Nasal Swab  Result Value Ref Range   MRSA, PCR NEGATIVE NEGATIVE   Staphylococcus aureus NEGATIVE NEGATIVE    Comment: (NOTE) The Xpert SA Assay (FDA approved for NASAL specimens in patients 28 years of age and older), is one component of a comprehensive surveillance program. It is not intended to diagnose infection nor to guide or monitor treatment. Performed at [facility], [address] [place], [place] [address]   Urine Drug Panel 7     Status: Normal   Collection Time: 07/14/23  9:22 AM  Result Value Ref Range   Amphetamines Negative    ETHANOL BIOMARKERS IA Negative    Barbiturates, IA Negative    COCAINE METABOLITE IA Negative    PHENCYCLIDINE IA Negative    6-ACETYLMORPHINE IA Negative    OPIATE CLASS IA Negative    OXYCODONE  CLASS IA Negative    FENTANYL , IA Negative     Creatinine 56     Comment: >=20   Urine pH 7.1     Comment: 4.5 - 8.9   Nitrites Negative   Drug Screen 10, Scr w/Conf, Ur     Status: None   Collection Time: 07/14/23  9:22 AM  Result Value Ref Range   Ethyl Glucuronide Negative CUTOFF:500 ng/mL   Amphetamines IA Negative CUTOFF:300 ng/mL   Barbiturates Negative CUTOFF:200 ng/mL   Benzodiazepines Negative CUTOFF:50 ng/mL   Cocaine Metabolite Negative CUTOFF:150 ng/mL   Phencyclidine (PCP) Negative CUTOFF:25 ng/mL   6-Acetylmorphine Negative CUTOFF:10 ng/mL   Opiates Negative CUTOFF:100 ng/mL   Oxycodone  Negative CUTOFF:100 ng/mL   Fentanyl  Negative CUTOFF:2.0 ng/mL   Creatinine 56 mg/dL    Comment: REFERENCE RANGE: Ref Range>=20   Urine pH 7.1 4.5 - 8.9   Nitrites Negative NEGATIVE ug/mL    Comment: Some components of this panel were developed and performance characteristics determined by [facility]. They have not been cleared or approved by the Food and Drug Administration: EtG  For clinical consultation, please call [phone].      Psychiatric Specialty Exam: Physical Exam  Review of Systems  Musculoskeletal:  Positive for back pain.    Weight 155 lb (70.3 kg), last menstrual period 12/18/2015.There is no height  or weight on file to calculate BMI.  General Appearance: Casual  Eye Contact:  Fair  Speech:  Normal Rate  Volume:  Decreased  Mood:  Anxious and Dysphoric  Affect:  Labile  Thought Process:  Goal Directed  Orientation:  Full (Time, Place, and Person)  Thought Content:  Rumination  Suicidal Thoughts:  No  Homicidal Thoughts:  No  Memory:  Immediate;   Fair Recent;   Fair Remote;   Fair  Judgement:  Intact  Insight:  Fair  Psychomotor Activity:  Increased  Concentration:  Concentration: Fair and Attention Span: Fair  Recall:  Fair  Fund of Knowledge:  Good  Language:  Good  Akathisia:  No  Handed:  Right  AIMS (if indicated):     Assets:  Communication Skills Desire for Improvement Housing   ADL's:  Intact  Cognition:  WNL  Sleep: Fair       08/21/2023    1:34 PM 03/05/2023    8:39 AM 02/18/2023    2:30 PM 12/24/2022    1:43 PM 12/09/2022   11:46 AM  Depression screen PHQ 2/9  Decreased Interest 0 0 2 1 1   Down, Depressed, Hopeless 2 2 3 1  0  PHQ - 2 Score 2 2 5 2 1   Altered sleeping 3 3 3 3 3   Tired, decreased energy 2 2 3 3 2   Change in appetite 1 3 0 1 2  Feeling bad or failure about yourself  0 0 2 1 2   Trouble concentrating 0 3 3 3 2   Moving slowly or fidgety/restless 3 2 2 3 2   Suicidal thoughts 0 0 0 0 0  PHQ-9 Score 11 15 18 16 14   Difficult doing work/chores   Extremely dIfficult Extremely dIfficult Somewhat difficult    Assessment/Plan: Mixed bipolar I disorder (HCC) - Plan: QUEtiapine  (SEROQUEL ) 300 MG tablet, sertraline  (ZOLOFT ) 50 MG tablet, lithium  carbonate (ESKALITH ) 450 MG ER tablet, QUEtiapine  (SEROQUEL ) 25 MG tablet  Insomnia, unspecified type - Plan: QUEtiapine  (SEROQUEL ) 300 MG tablet, sertraline  (ZOLOFT ) 50 MG tablet, QUEtiapine  (SEROQUEL ) 25 MG tablet  Post-traumatic stress disorder, unspecified - Plan: sertraline  (ZOLOFT ) 50 MG tablet, QUEtiapine  (SEROQUEL ) 25 MG tablet  I reviewed blood work results.  Lithium  level 1.0.  Hemoglobin A1c 6.3.  She actually lost weight but also increase sugar craving.  Discussed breathing technique to calm her anger.  Recommend to try Seroquel  25 mg twice a day to help with irritability and keep the Seroquel  300 mg at bedtime.  Encouraged to keep appointment with his Hilario Lover.  Continue lithium  450 mg 3 times a day, Zoloft  25 mg daily.  I reviewed notes from other provider and recent visit to the emergency room.  Patient told she accidentally cut her finger and it was not suicidal attempt.  Patient agreed to have a follow-up in 2 months.  Patient is hoping to have surgery scheduled for her back pain and admitted sometime pain aggravates her mood irritability.  She is hoping her car fixed soon.  She has no plan to drive  but she like to give her car to her daughter.  I will follow-up in 2 months but recommend to call us  back if she has any question, concern or if she feels worsening of the symptoms.  She is also taking melatonin up to 10 mg as needed for insomnia.  Prescribed hydrocodone  after the procedure but she is no longer taking it.   Follow Up Instructions:     I discussed the assessment  and treatment plan with the patient. The patient was provided an opportunity to ask questions and all were answered. The patient agreed with the plan and demonstrated an understanding of the instructions.   The patient was advised to call back or seek an in-person evaluation if the symptoms worsen or if the condition fails to improve as anticipated.    Collaboration of Care: Other provider involved in patient's care AEB notes are available in epic to review  Patient/Guardian was advised Release of Information must be obtained prior to any record release in order to collaborate their care with an outside provider. Patient/Guardian was advised if they have not already done so to contact the registration department to sign all necessary forms in order for us  to release information regarding their care.   Consent: Patient/Guardian gives verbal consent for treatment and assignment of benefits for services provided during this visit. Patient/Guardian expressed understanding and agreed to proceed.     Total encounter time 31 minutes which includes face-to-face time, chart reviewed, care coordination, order entry and documentation during this encounter.   Note: This document was prepared by [employer] voice dictation technology and any errors that results from this process are unintentional.    Arturo Late, MD 09/01/2023

## 2023-09-01 NOTE — Telephone Encounter (Signed)
 Orders placed for patient to get CBC, potassium, lipid panel, TSH, and magnesium  levels check at lab appointment. Will discuss at appt on Monday. Last A1c in March 2025 so she does not need repeat at this time. Sent MyChart message explaining.

## 2023-09-01 NOTE — Telephone Encounter (Signed)
 Patient coming in next Monday to see you to discuss her A1C and potassium. She would like to to do the labwork this week so can discuss the results at her appointment. Can you put future orders in for this, please?

## 2023-09-04 ENCOUNTER — Other Ambulatory Visit: Payer: MEDICAID

## 2023-09-04 DIAGNOSIS — R7989 Other specified abnormal findings of blood chemistry: Secondary | ICD-10-CM

## 2023-09-04 DIAGNOSIS — G723 Periodic paralysis: Secondary | ICD-10-CM

## 2023-09-04 DIAGNOSIS — D696 Thrombocytopenia, unspecified: Secondary | ICD-10-CM

## 2023-09-04 DIAGNOSIS — E7800 Pure hypercholesterolemia, unspecified: Secondary | ICD-10-CM

## 2023-09-05 LAB — LIPID PANEL
Chol/HDL Ratio: 4.2 ratio (ref 0.0–4.4)
Cholesterol, Total: 184 mg/dL (ref 100–199)
HDL: 44 mg/dL (ref >39)
LDL Chol Calc (NIH): 111 mg/dL — ABNORMAL HIGH (ref 0–99)
Triglycerides: 165 mg/dL — ABNORMAL HIGH (ref 0–149)
VLDL Cholesterol Cal: 29 mg/dL (ref 5–40)

## 2023-09-05 LAB — CBC WITH DIFFERENTIAL/PLATELET
Basophils Absolute: 0 10*3/uL (ref 0.0–0.2)
Basos: 1 %
EOS (ABSOLUTE): 0 10*3/uL (ref 0.0–0.4)
Eos: 1 %
Hematocrit: 39.9 % (ref 34.0–46.6)
Hemoglobin: 12.7 g/dL (ref 11.1–15.9)
Immature Grans (Abs): 0 10*3/uL (ref 0.0–0.1)
Immature Granulocytes: 0 %
Lymphocytes Absolute: 1.8 10*3/uL (ref 0.7–3.1)
Lymphs: 52 %
MCH: 27.1 pg (ref 26.6–33.0)
MCHC: 31.8 g/dL (ref 31.5–35.7)
MCV: 85 fL (ref 79–97)
Monocytes Absolute: 0.3 10*3/uL (ref 0.1–0.9)
Monocytes: 8 %
Neutrophils Absolute: 1.3 10*3/uL — ABNORMAL LOW (ref 1.4–7.0)
Neutrophils: 38 %
Platelets: 187 10*3/uL (ref 150–450)
RBC: 4.69 x10E6/uL (ref 3.77–5.28)
RDW: 13.9 % (ref 11.7–15.4)
WBC: 3.4 10*3/uL (ref 3.4–10.8)

## 2023-09-05 LAB — TSH RFX ON ABNORMAL TO FREE T4: TSH: 0.272 u[IU]/mL — ABNORMAL LOW (ref 0.450–4.500)

## 2023-09-05 LAB — POTASSIUM: Potassium: 4.2 mmol/L (ref 3.5–5.2)

## 2023-09-05 LAB — MAGNESIUM: Magnesium: 1.9 mg/dL (ref 1.6–2.3)

## 2023-09-05 LAB — T4F: T4,Free (Direct): 0.9 ng/dL (ref 0.82–1.77)

## 2023-09-08 ENCOUNTER — Ambulatory Visit: Payer: MEDICAID | Admitting: Family Medicine

## 2023-09-08 ENCOUNTER — Encounter: Payer: Self-pay | Admitting: Family Medicine

## 2023-09-08 VITALS — BP 123/83 | HR 79 | Ht 64.0 in | Wt 153.4 lb

## 2023-09-08 DIAGNOSIS — E7800 Pure hypercholesterolemia, unspecified: Secondary | ICD-10-CM | POA: Insufficient documentation

## 2023-09-08 DIAGNOSIS — M21611 Bunion of right foot: Secondary | ICD-10-CM | POA: Diagnosis not present

## 2023-09-08 DIAGNOSIS — G723 Periodic paralysis: Secondary | ICD-10-CM

## 2023-09-08 DIAGNOSIS — F319 Bipolar disorder, unspecified: Secondary | ICD-10-CM

## 2023-09-08 DIAGNOSIS — M4722 Other spondylosis with radiculopathy, cervical region: Secondary | ICD-10-CM | POA: Diagnosis not present

## 2023-09-08 DIAGNOSIS — E0590 Thyrotoxicosis, unspecified without thyrotoxic crisis or storm: Secondary | ICD-10-CM | POA: Insufficient documentation

## 2023-09-08 DIAGNOSIS — R7309 Other abnormal glucose: Secondary | ICD-10-CM

## 2023-09-08 NOTE — Assessment & Plan Note (Signed)
 Without overt symptoms of hypothyroidism.  Discussed watchful waiting and letting me know if she develops these over time.

## 2023-09-08 NOTE — Patient Instructions (Addendum)
 You can use these exercises to help strengthen the quadriceps. If these are painful, please stop them. Also be sure to eat a lot of protein!  Follow these instructions to help with your bunion until you see the podiatrist. Come back in 1 month to recheck A1c or sooner if needed.

## 2023-09-08 NOTE — Assessment & Plan Note (Signed)
 Potassium and magnesium  rechecked within normal limits.  No further symptoms.

## 2023-09-08 NOTE — Progress Notes (Addendum)
    SUBJECTIVE:   CHIEF COMPLAINT / HPI:   Lab follow up Wanted to have her A1c rechecked today given prediabetes and her potassium checked given her history of hypokalemic periodic paralysis.  Bipolar 1 disorder Working with psych and taking lithium  without issue.  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  Weight loss Has been making great strides in her diet and exercise.  She does notice now that her quad muscles shake when walking, and she wants to make sure that this is normal.  Also feels like she has been having more pain of her right great toe with more walking.  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  OBJECTIVE:   BP 123/83   Pulse 79   Ht 5\' 4"  (1.626 m)   Wt 153 lb 6 oz (69.6 kg)   LMP 12/18/2015 (Approximate)   SpO2 100%   BMI 26.33 kg/m   General: Alert and oriented, in NAD Skin: Warm, dry, and intact HEENT: NCAT, EOM grossly normal, midline nasal septum Cardiac: RRR, no m/r/g appreciated Respiratory: CTAB, breathing and speaking comfortably on RA Abdominal: Soft, nondistended, normoactive bowel sounds Extremities: Moves all extremities grossly equally, bunion with tenderness to palpation of right foot without significant erythema Neurological: No gross focal deficit Psychiatric: Appropriate mood and affect  ASSESSMENT/PLAN:   Assessment & Plan Bunion of great toe of right foot Gave handout describing conservative measures to help with pain.  Referred to podiatry for further evaluation. Cervical spondylosis with radiculopathy Reassuringly improved with orthopedic intervention. Elevated hemoglobin A1c Previously taken in March 2025; therefore, advised patient to come back in 1 month for recheck.  Continue amazing lifestyle changes. Hypokalemic periodic paralysis Potassium and magnesium  rechecked  within normal limits.  No further symptoms. Bipolar 1 disorder (HCC) Stable.  Continue follow-up with psychiatry. Elevated LDL cholesterol level Continues to work on lifestyle changes. Subclinical hyperthyroidism Without overt symptoms of hypothyroidism.  Discussed watchful waiting and letting me know if she develops these over time.   Genetta Kenning, MD [facility]

## 2023-09-08 NOTE — Assessment & Plan Note (Signed)
 Previously taken in March 2025; therefore, advised patient to come back in 1 month for recheck.  Continue amazing lifestyle changes.

## 2023-09-08 NOTE — Assessment & Plan Note (Signed)
 Reassuringly improved with orthopedic intervention.

## 2023-09-08 NOTE — Assessment & Plan Note (Signed)
 Gave handout describing conservative measures to help with pain.  Referred to podiatry for further evaluation.

## 2023-09-08 NOTE — Assessment & Plan Note (Signed)
Stable   Continue follow up with psychiatry

## 2023-09-08 NOTE — Assessment & Plan Note (Signed)
 Continues to work on lifestyle changes.

## 2023-09-18 ENCOUNTER — Encounter: Payer: Self-pay | Admitting: Podiatry

## 2023-09-18 ENCOUNTER — Ambulatory Visit (INDEPENDENT_AMBULATORY_CARE_PROVIDER_SITE_OTHER): Payer: MEDICAID | Admitting: Podiatry

## 2023-09-18 ENCOUNTER — Ambulatory Visit (INDEPENDENT_AMBULATORY_CARE_PROVIDER_SITE_OTHER): Payer: MEDICAID

## 2023-09-18 DIAGNOSIS — M2011 Hallux valgus (acquired), right foot: Secondary | ICD-10-CM

## 2023-09-18 DIAGNOSIS — Z800 Family history of malignant neoplasm of digestive organs: Secondary | ICD-10-CM | POA: Insufficient documentation

## 2023-09-18 DIAGNOSIS — M2041 Other hammer toe(s) (acquired), right foot: Secondary | ICD-10-CM | POA: Diagnosis not present

## 2023-09-18 DIAGNOSIS — M2012 Hallux valgus (acquired), left foot: Secondary | ICD-10-CM

## 2023-09-18 NOTE — Progress Notes (Signed)
 Subjective:  Patient ID: Bethany [name], female    DOB: Oct 05, 1975,  MRN: [id number] HPI Chief Complaint  Patient presents with   Foot Pain    1st MPJ bilateral - bunions x years, hurting more recently, shoes are getting uncomfortable, also hammertoes 5th bilateral - developing corns   New Patient (Initial Visit)  She states that she has tried wider shoe gear anti-inflammatories topical and oral.  States that it really bothers her with walking and any shoe gear.  States has been going on for years.  48 y.o. female presents with the above complaint.   ROS: Denies fever chills nausea vomiting muscle aches pains calf pain back pain chest pain shortness of breath.  Past Medical History:  Diagnosis Date   Anemia    Anxiety    Bipolar disorder (HCC)    Bipolar 1   COVID-19    Depression    Family history of breast cancer 06/02/2018   Fibrocystic disease of both breasts    GERD (gastroesophageal reflux disease)    History of cervical dysplasia    laser surgery   Hyperglycemia 10/14/2020   Hypokalemia 12/03/2021   Insomnia    Left breast mass 06/30/2008   Low blood sugar 2001   pt checks BS in AM, controls with diet   Need for immunization against influenza 01/12/2022   Routine cervical smear 06/20/2020   Routine health maintenance 05/09/2014   Routine screening for STI (sexually transmitted infection) 01/12/2022   S/P mastectomy, bilateral 10/27/2018   Surgery follow-up 10/19/2018   UTI (urinary tract infection) 11/01/2020   Vaginal Pap smear, abnormal    Past Surgical History:  Procedure Laterality Date   ANTERIOR CERVICAL DECOMP/DISCECTOMY FUSION N/A 07/24/2023   Procedure: ANTERIOR CERVICAL DECOMPRESSION/DISCECTOMY FUSION 1 LEVEL;  Surgeon: Virl Grimes, MD;  Location: [facility];  Service: Orthopedics;  Laterality: N/A;  ANTERIOR CERVICAL DECOMPRESSION FUSION CERVICAL 4- CERVICAL 5 WITH INSTRUMENTATION AND ALLOGRAFT   AUGMENTATION MAMMAPLASTY Bilateral    BREAST FIBROADENOMA  SURGERY  2011, 2008   benign    BREAST IMPLANT REMOVAL Bilateral 10/31/2022   Procedure: REMOVAL BREAST IMPLANTS;  Surgeon: Thornell Flirt, DO;  Location: [facility];  Service: Plastics;  Laterality: Bilateral;   BREAST RECONSTRUCTION WITH PLACEMENT OF TISSUE EXPANDER AND FLEX HD (ACELLULAR HYDRATED DERMIS) Bilateral 10/19/2018   Procedure: BREAST RECONSTRUCTION WITH PLACEMENT OF TISSUE EXPANDER AND FLEX HD (ACELLULAR HYDRATED DERMIS);  Surgeon: Thornell Flirt, DO;  Location: [facility];  Service: Plastics;  Laterality: Bilateral;   CESAREAN SECTION  04/22/1993   HYSTEROSCOPY WITH NOVASURE N/A 01/02/2016   Procedure: NOVASURE;  Surgeon: Tresia Fruit, MD;  Location: [facility];  Service: Gynecology;  Laterality: N/A;   LIPOSUCTION WITH LIPOFILLING Bilateral 05/07/2023   Procedure: Excision of bilateral breast scar contracture and lipo filling bilateral breasts;  Surgeon: Thornell Flirt, DO;  Location: [facility];  Service: Plastics;  Laterality: Bilateral;   MASTECTOMY Bilateral 2019   NIPPLE SPARING MASTECTOMY Bilateral 10/19/2018   Procedure: BILATERAL NIPPLE SPARING MASTECTOMY;  Surgeon: Caralyn Chandler, MD;  Location: [facility];  Service: General;  Laterality: Bilateral;   REMOVAL OF BILATERAL TISSUE EXPANDERS WITH PLACEMENT OF BILATERAL BREAST IMPLANTS Bilateral 12/30/2018   Procedure: REMOVAL OF BILATERAL TISSUE EXPANDERS WITH PLACEMENT OF BILATERAL BREAST IMPLANTS;  Surgeon: Thornell Flirt, DO;  Location: [facility];  Service: Plastics;  Laterality: Bilateral;   TUBAL LIGATION  04/22/2001    Current Outpatient Medications:    carboxymethylcellulose (  REFRESH PLUS) 0.5 % SOLN, Place 1 drop into both eyes 2 (two) times daily as needed (dry eyes)., Disp: , Rfl:    HYDROcodone -acetaminophen  (NORCO/VICODIN) 5-325 MG tablet, Take 1 tablet by mouth every 6 (six) hours as needed for moderate pain (pain score 4-6) or severe pain (pain score  7-10). (Patient not taking: Reported on 09/08/2023), Disp: 20 tablet, Rfl: 0   linaclotide  (LINZESS ) 145 MCG CAPS capsule, TAKE 1 CAPSULE BY MOUTH EVERY DAY BEFORE BREAKFAST - INSURANCE WILL PAY ON 6/25, Disp: 90 capsule, Rfl: 3   lithium  carbonate (ESKALITH ) 450 MG ER tablet, Take 1 tablet (450 mg total) by mouth 3 (three) times daily., Disp: 90 tablet, Rfl: 1   Melatonin 10 MG CAPS, Take 10 mg by mouth at bedtime as needed., Disp: , Rfl:    methocarbamol  (ROBAXIN ) 500 MG tablet, Take 1-2 tablets (500-1,000 mg total) by mouth 4 (four) times daily. (Patient not taking: Reported on 09/08/2023), Disp: 60 tablet, Rfl: 0   ondansetron  (ZOFRAN ) 4 MG tablet, Take 1 tablet (4 mg total) by mouth every 8 (eight) hours as needed for nausea or vomiting., Disp: 20 tablet, Rfl: 0   pantoprazole  (PROTONIX ) 40 MG tablet, TAKE 1 TABLET BY MOUTH EVERY DAY, Disp: 90 tablet, Rfl: 1   promethazine  (PHENERGAN ) 12.5 MG tablet, Take 1 tablet (12.5 mg total) by mouth every 6 (six) hours as needed for nausea or vomiting., Disp: 30 tablet, Rfl: 0   QUEtiapine  (SEROQUEL ) 25 MG tablet, Take 1 tablet (25 mg total) by mouth 2 (two) times daily., Disp: 60 tablet, Rfl: 1   QUEtiapine  (SEROQUEL ) 300 MG tablet, Take 1 tablet (300 mg total) by mouth at bedtime., Disp: 30 tablet, Rfl: 1   sertraline  (ZOLOFT ) 50 MG tablet, Take 1.5 tablets (75 mg total) by mouth daily., Disp: 45 tablet, Rfl: 1  Allergies  Allergen Reactions   Penicillins Hives and Nausea And Vomiting   Tapentadol Hives and Other (See Comments)    Name brand is NUCYNTA    Lamictal  [Lamotrigine ] Rash   Codeine Hives   Oxycodone  Hcl Hives   Review of Systems Objective:  There were no vitals filed for this visit.  General: Well developed, nourished, in no acute distress, alert and oriented x3   Dermatological: Skin is warm, dry and supple bilateral. Nails x 10 are well maintained; remaining integument appears unremarkable at this time. There are no open sores, no  preulcerative lesions, no rash or signs of infection present.  Vascular: Dorsalis Pedis artery and Posterior Tibial artery pedal pulses are 2/4 bilateral with immedate capillary fill time. Pedal hair growth present. No varicosities and no lower extremity edema present bilateral.   Neruologic: Grossly intact via light touch bilateral. Vibratory intact via tuning fork bilateral. Protective threshold with Semmes Wienstein monofilament intact to all pedal sites bilateral. Patellar and Achilles deep tendon reflexes 2+ bilateral. No Babinski or clonus noted bilateral.   Musculoskeletal: No gross boney pedal deformities bilateral. No pain, crepitus, or limitation noted with foot and ankle range of motion bilateral. Muscular strength 5/5 in all groups tested bilateral.  Hallux abductovalgus deformity bilateral with flexible pes planus bilateral.  She has pain on range of motion of the first metatarsophalangeal joint without crepitation.  She has pain on palpation of the hypertrophic medial condyle of the medial aspect of the head of that first metatarsal.  No hallux interphalangeal is identified.  The deformity is reducible.  Rigid hammertoe deformities fifth bilateral the remainder of the toes are flexible.  Gait: Unassisted, Nonantalgic.    Radiographs:  Radiographs taken today bilateral foot demonstrates an osseously mature individual with good bone mineralization.  Pes planovalgus is noted bilateral without signs of coalition.  She has an increase in the first intermetatarsal angle greater than 8 degrees with a hypertrophic medial condyle.  Hallux abductus is approximately 28 degrees.  Left foot demonstrates similar findings.  No osteoarthritic change of the joint.  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  Hammertoe deformity fifth bilateral right  greater than left.  Plan: Discussed etiology pathology conservative surgical therapies consented her today for an [facility] bunion repair of the right foot with screw fixation.  She understands this is amenable to it also consented her for a hammertoe repair.  She states that since exactly where she feels like she needs to have done we did discuss the pros and cons of surgery which may include but not limited to postop pain bleeding swell infection recurrence need further surgery overcorrection loss of digit loss of limb loss of life.  We discussed anesthesia which will consist of propofol  type sedation most likely with a sciatic block.  We discussed the surgery center.  Dispensed information consent which was signed and cam boot which she will wear before surgery if necessary.     Bethany [name], [place]

## 2023-09-23 ENCOUNTER — Other Ambulatory Visit (HOSPITAL_COMMUNITY): Payer: Self-pay | Admitting: Psychiatry

## 2023-09-23 DIAGNOSIS — G4700 Insomnia, unspecified: Secondary | ICD-10-CM

## 2023-09-23 DIAGNOSIS — F4310 Post-traumatic stress disorder, unspecified: Secondary | ICD-10-CM

## 2023-09-23 DIAGNOSIS — F3160 Bipolar disorder, current episode mixed, unspecified: Secondary | ICD-10-CM

## 2023-09-24 ENCOUNTER — Telehealth: Payer: Self-pay | Admitting: Podiatry

## 2023-09-24 ENCOUNTER — Encounter: Payer: Self-pay | Admitting: Podiatry

## 2023-09-24 NOTE — Telephone Encounter (Signed)
 Pt called and was going over post op instructions and she cannot remember if you told her she would be weight bearing or not. She is looking to see if she needs to order any of the items on post op form if needed. Please advise

## 2023-09-24 NOTE — Telephone Encounter (Signed)
 NOTIFIED PT AND SHE SAID THANK YOU

## 2023-09-30 ENCOUNTER — Ambulatory Visit (INDEPENDENT_AMBULATORY_CARE_PROVIDER_SITE_OTHER): Payer: MEDICAID | Admitting: Clinical

## 2023-09-30 ENCOUNTER — Encounter (HOSPITAL_COMMUNITY): Payer: Self-pay | Admitting: Clinical

## 2023-09-30 DIAGNOSIS — F4310 Post-traumatic stress disorder, unspecified: Secondary | ICD-10-CM

## 2023-09-30 DIAGNOSIS — F3160 Bipolar disorder, current episode mixed, unspecified: Secondary | ICD-10-CM | POA: Diagnosis not present
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
## 2023-09-30 NOTE — Addendum Note (Signed)
 Addended by: Ancel Kass on: 09/30/2023 01:56 PM   Modules accepted: Level of Service

## 2023-10-01 ENCOUNTER — Encounter: Payer: Self-pay | Admitting: Family Medicine

## 2023-10-03 ENCOUNTER — Telehealth: Payer: Self-pay | Admitting: Family Medicine

## 2023-10-03 NOTE — Telephone Encounter (Signed)
 Received request for surgical optimization per podiatrist. Admin team: please schedule an appointment for patient to come in for optimization. If I am not available, she can see another physician. The forms will be in my physical inbox. Thanks!

## 2023-10-10 ENCOUNTER — Encounter: Payer: Self-pay | Admitting: Family Medicine

## 2023-10-10 ENCOUNTER — Ambulatory Visit: Payer: MEDICAID | Admitting: Family Medicine

## 2023-10-10 VITALS — BP 110/79 | Ht 64.0 in | Wt 149.2 lb

## 2023-10-10 DIAGNOSIS — Z01818 Encounter for other preprocedural examination: Secondary | ICD-10-CM | POA: Diagnosis not present

## 2023-10-10 DIAGNOSIS — R7309 Other abnormal glucose: Secondary | ICD-10-CM

## 2023-10-10 LAB — POCT GLYCOSYLATED HEMOGLOBIN (HGB A1C): HbA1c, POC (controlled diabetic range): 6.2 % (ref 0.0–7.0)

## 2023-10-10 NOTE — Assessment & Plan Note (Signed)
 Recheck today A1c 6.2.  She is continuing to decrease this with lifestyle management.  Continue the excellent work.

## 2023-10-10 NOTE — Progress Notes (Cosign Needed)
  Patient Name: Bethany [name] Date of Birth: [birth year] Date of Visit: 10/10/23 PCP: Dema Filler, MD  Chief Complaint: preoperative evaluation Surgeon:  Jinnie Mountain, DPM Surgery: Bunionectomy Date of Procedure: 10/17/2023  Subjective: Bethany [name] is a pleasant 48 y.o. with medical history significant for migraines, subclinical hyperthyroidism, hyperkalemic periodic paralysis, elevated A1c/LDL, and bipolar 1 disorder presenting today for preoperative evaluation for above procedure.   The patient can walk up 2 flights of stairs without difficulty. Just has some slow motion due to her back injury.   Prior surgeries: breast augmentation and implants s/p double mastectomy, C-section  Review of systems: Chest pain with exertion? No Dyspnea on exertion? No Can you walk up a flight of stairs without chest pain or dyspnea? Yes Any history of easy bruising or bleeding? No Any family history of bleeding diathesis? No Any personal or family of difficulty with anesthesia? No Any history of sleep apnea? No  Medical History: Cardiac conditions? none History of VTE? none History of adverse surgical outcome? none  Diabetes? None Obesity? none OSA? none   I have reviewed the patient's medical, surgical, family, and social history as appropriate.   Objective: Vitals:   10/10/23 0957  BP: 110/79  SpO2: 100%   Filed Weights   10/10/23 0957  Weight: 149 lb 3.2 oz (67.7 kg)   General: Alert and oriented, in NAD Skin: Warm, dry, and intact without lesions HEENT: NCAT, EOM grossly normal, midline nasal septum Cardiac: RRR, no m/r/g appreciated Respiratory: CTAB, breathing and speaking comfortably on RA Abdominal: Soft, nontender, nondistended, normoactive bowel sounds Extremities: Moves all extremities grossly equally Neurological: No gross focal deficit Psychiatric: Appropriate mood and affect   Assessment and Plan:  Assessment & Plan Elevated hemoglobin A1c Recheck today A1c  6.2.  She is continuing to decrease this with lifestyle management.  Continue the excellent work.  Patient is low risk for a low risk surgery given her lack of cardiac or pulmonary risk factors.  She has tolerated surgical procedure with anesthesia in the past without difficulty. The patient has been medically optimized for this procedure. She does not need to hold any medications before the procedure.  Genetta Kenning, MD  Family Medicine Teaching Service

## 2023-10-10 NOTE — Patient Instructions (Signed)
 You are optimized for surgery. I will send the forms to your surgeon. Let me know if you have questions in the meantime. Keep up the great work!

## 2023-10-15 ENCOUNTER — Other Ambulatory Visit: Payer: Self-pay | Admitting: Podiatry

## 2023-10-15 ENCOUNTER — Telehealth: Payer: Self-pay | Admitting: Podiatry

## 2023-10-15 ENCOUNTER — Encounter (HOSPITAL_COMMUNITY): Payer: Self-pay | Admitting: Clinical

## 2023-10-15 ENCOUNTER — Ambulatory Visit (INDEPENDENT_AMBULATORY_CARE_PROVIDER_SITE_OTHER): Payer: MEDICAID | Admitting: Clinical

## 2023-10-15 DIAGNOSIS — F4310 Post-traumatic stress disorder, unspecified: Secondary | ICD-10-CM

## 2023-10-15 DIAGNOSIS — F3160 Bipolar disorder, current episode mixed, unspecified: Secondary | ICD-10-CM | POA: Diagnosis not present

## 2023-10-15 MED ORDER — CLINDAMYCIN HCL 150 MG PO CAPS: 150.0000 mg | ORAL_CAPSULE | Freq: Three times a day (TID) | ORAL | 0 refills | Status: DC

## 2023-10-15 MED ORDER — HYDROCODONE-ACETAMINOPHEN 5-325 MG PO TABS: 2.0000 | ORAL_TABLET | Freq: Four times a day (QID) | ORAL | 0 refills | Status: DC | PRN

## 2023-10-15 MED ORDER — ONDANSETRON HCL 4 MG PO TABS: 4.0000 mg | ORAL_TABLET | Freq: Three times a day (TID) | ORAL | 0 refills | Status: DC | PRN

## 2023-10-15 MED ORDER — CLINDAMYCIN HCL 150 MG PO CAPS
150.0000 mg | ORAL_CAPSULE | Freq: Three times a day (TID) | ORAL | 0 refills | Status: DC
Start: 2023-10-15 — End: 2023-10-15

## 2023-10-15 NOTE — Telephone Encounter (Signed)
 Called pt yesterday to let her know that we have not received the clearance from Dr Arfeen yet and it had been faxed 6/4 and 6/24. She called and Dr Curry is out of the office currently. I told her   She returned call today to check to see if we had received it and we had not.  Then I received a call from Dr Veto office and Regan did locate the clearance letter and asked if ok if another provider that is covering for Dr Curry signs off. I told her as long as he is ok with signing off on surgery that would be ok.
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
## 2023-10-16 ENCOUNTER — Other Ambulatory Visit: Payer: Self-pay | Admitting: Podiatry

## 2023-10-16 MED ORDER — HYDROCODONE-ACETAMINOPHEN 5-325 MG PO TABS: 1.0000 | ORAL_TABLET | Freq: Four times a day (QID) | ORAL | 0 refills | Status: AC | PRN

## 2023-10-17 DIAGNOSIS — M2041 Other hammer toe(s) (acquired), right foot: Secondary | ICD-10-CM | POA: Diagnosis not present

## 2023-10-17 DIAGNOSIS — M2011 Hallux valgus (acquired), right foot: Secondary | ICD-10-CM | POA: Diagnosis not present

## 2023-10-21 ENCOUNTER — Encounter: Payer: Self-pay | Admitting: Plastic Surgery

## 2023-10-21 ENCOUNTER — Ambulatory Visit (INDEPENDENT_AMBULATORY_CARE_PROVIDER_SITE_OTHER): Payer: MEDICAID | Admitting: Plastic Surgery

## 2023-10-21 DIAGNOSIS — Z9013 Acquired absence of bilateral breasts and nipples: Secondary | ICD-10-CM

## 2023-10-21 DIAGNOSIS — N649 Disorder of breast, unspecified: Secondary | ICD-10-CM

## 2023-10-21 DIAGNOSIS — F319 Bipolar disorder, unspecified: Secondary | ICD-10-CM

## 2023-10-21 NOTE — Progress Notes (Signed)
   Subjective:    Patient ID: Bethany [name], female    DOB: 14-Jul-1975, 48 y.o.   MRN: [id number]  The patient is a 48 year old female here for evaluation of her breasts.  The patient was first seen in 2020 she had extreme density of her breasts and decided to undergo bilateral mastectomies June 2020.  She had expanders placed at the time of the mastectomies.  Then in September 2020 she had Mentor smooth moderate plus profile extra gel 295 cc implants placed in both breasts.  In July 2024 the patient underwent removal of both breast implants with capsulectomies.  The patient did not want to do any further reconstruction or keep the implants at that time.  She then came back in January 2025 and requested fat filling just to help with the contour.  And that was done with around 200 cc of fat placed in both breasts.  This did help quite a bit.  Today the patient is back and wants to know if she could have a little bit more done just to even out and release the scar contracture.    Review of Systems  Constitutional: Negative.   Eyes: Negative.   Respiratory: Negative.    Cardiovascular: Negative.   Gastrointestinal: Negative.   Endocrine: Negative.   Genitourinary: Negative.   Musculoskeletal: Negative.        Objective:   Physical Exam Vitals reviewed.  Constitutional:      Appearance: Normal appearance.  HENT:     Head: Atraumatic.   Cardiovascular:     Rate and Rhythm: Normal rate.     Pulses: Normal pulses.  Pulmonary:     Effort: Pulmonary effort is normal.   Skin:    General: Skin is warm.     Capillary Refill: Capillary refill takes less than 2 seconds.   Neurological:     Mental Status: She is alert and oriented to person, place, and time.   Psychiatric:        Mood and Affect: Mood normal.        Behavior: Behavior normal.        Thought Content: Thought content normal.        Judgment: Judgment normal.         Assessment & Plan:     ICD-10-CM   1. Bipolar  1 disorder (HCC)  F31.9     2. Breast disease  N64.9     3. 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  I think it is reasonable to try and do some more fat grafting to even out and obtain some symmetry with release of the scar contracture.  The plan will be for bilateral breast fat injection.

## 2023-10-22 ENCOUNTER — Other Ambulatory Visit (HOSPITAL_COMMUNITY): Payer: Self-pay | Admitting: Psychiatry

## 2023-10-22 DIAGNOSIS — G4700 Insomnia, unspecified: Secondary | ICD-10-CM

## 2023-10-22 DIAGNOSIS — F3160 Bipolar disorder, current episode mixed, unspecified: Secondary | ICD-10-CM

## 2023-10-23 ENCOUNTER — Ambulatory Visit: Payer: MEDICAID

## 2023-10-23 ENCOUNTER — Ambulatory Visit: Payer: MEDICAID | Admitting: Podiatry

## 2023-10-23 ENCOUNTER — Encounter: Payer: Self-pay | Admitting: Podiatry

## 2023-10-23 VITALS — BP 112/73 | HR 74 | Temp 98.8°F

## 2023-10-23 DIAGNOSIS — M2041 Other hammer toe(s) (acquired), right foot: Secondary | ICD-10-CM

## 2023-10-23 DIAGNOSIS — Z9889 Other specified postprocedural states: Secondary | ICD-10-CM

## 2023-10-23 DIAGNOSIS — M2011 Hallux valgus (acquired), right foot: Secondary | ICD-10-CM

## 2023-10-23 NOTE — Progress Notes (Signed)
 She presents today for postop visit #1 date of surgery is 10/17/2023 right Austin bunionectomy with screw fixation fifth digit right foot.  States that is fairly sore but other than that is doing quite well.  Very happy with the way it looks.  Objective: Vital signs are stable she is alert oriented x 3 there is minimal edema no erythema cellulitis drainage at her incision sites healing very nicely toe sitting rectus as well as the fifth digit right foot.  Radiographs taken today demonstrate a rectus first metatarsal phalangeal joint with good fixation.  Assessment: Well-healing surgical foot.  Plan: Redressed today dressed a compressive dressing continue use of the cam boot keep it dry and clean we will follow-up with her in 2 weeks.

## 2023-10-26 ENCOUNTER — Other Ambulatory Visit (HOSPITAL_COMMUNITY): Payer: Self-pay | Admitting: Psychiatry

## 2023-10-26 DIAGNOSIS — F3160 Bipolar disorder, current episode mixed, unspecified: Secondary | ICD-10-CM

## 2023-10-27 ENCOUNTER — Encounter (HOSPITAL_COMMUNITY): Payer: Self-pay | Admitting: Psychiatry

## 2023-10-27 ENCOUNTER — Telehealth (HOSPITAL_BASED_OUTPATIENT_CLINIC_OR_DEPARTMENT_OTHER): Payer: MEDICAID | Admitting: Psychiatry

## 2023-10-27 VITALS — Wt 149.0 lb

## 2023-10-27 DIAGNOSIS — G4700 Insomnia, unspecified: Secondary | ICD-10-CM

## 2023-10-27 DIAGNOSIS — F4310 Post-traumatic stress disorder, unspecified: Secondary | ICD-10-CM | POA: Diagnosis not present

## 2023-10-27 DIAGNOSIS — F3160 Bipolar disorder, current episode mixed, unspecified: Secondary | ICD-10-CM | POA: Diagnosis not present

## 2023-10-27 MED ORDER — LITHIUM CARBONATE ER 450 MG PO TBCR: 450.0000 mg | EXTENDED_RELEASE_TABLET | Freq: Three times a day (TID) | ORAL | 2 refills | Status: DC

## 2023-10-27 MED ORDER — SERTRALINE HCL 25 MG PO TABS: 75.0000 mg | ORAL_TABLET | Freq: Every day | ORAL | 2 refills | Status: DC

## 2023-10-27 MED ORDER — QUETIAPINE FUMARATE 300 MG PO TABS: 300.0000 mg | ORAL_TABLET | Freq: Every day | ORAL | 2 refills | Status: DC

## 2023-10-27 MED ORDER — QUETIAPINE FUMARATE 25 MG PO TABS: 25.0000 mg | ORAL_TABLET | Freq: Two times a day (BID) | ORAL | 2 refills | Status: DC
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
## 2023-10-30 ENCOUNTER — Ambulatory Visit (INDEPENDENT_AMBULATORY_CARE_PROVIDER_SITE_OTHER): Payer: MEDICAID | Admitting: Physician Assistant

## 2023-10-30 VITALS — BP 122/82 | HR 79 | Ht 64.0 in | Wt 160.0 lb

## 2023-10-30 DIAGNOSIS — Z9013 Acquired absence of bilateral breasts and nipples: Secondary | ICD-10-CM

## 2023-10-30 MED ORDER — OXYCODONE HCL 5 MG PO TABS: 5.0000 mg | ORAL_TABLET | Freq: Three times a day (TID) | ORAL | 0 refills | Status: AC | PRN

## 2023-10-30 MED ORDER — CLINDAMYCIN HCL 150 MG PO CAPS: 450.0000 mg | ORAL_CAPSULE | Freq: Three times a day (TID) | ORAL | 0 refills | Status: DC

## 2023-10-30 MED ORDER — ONDANSETRON 4 MG PO TBDP: 4.0000 mg | ORAL_TABLET | Freq: Three times a day (TID) | ORAL | 0 refills | Status: AC | PRN

## 2023-10-30 NOTE — H&P (View-Only) (Signed)
 Patient ID: DAPHNA LAFUENTE, female    DOB: January 11, 1976, 48 y.o.   MRN: [id number]  Chief Complaint  Patient presents with   Pre-op Exam      ICD-10-CM   1. Acquired absence of both breasts  Z90.13        History of Present Illness: [name] is a 48 y.o.  female  with a history of bilateral mastectomy with implant-based reconstruction in 2020 followed by removal of bilateral breast implants with capsulectomies 2024 and subsequent fat grafting earlier this year.  She presents for preoperative evaluation for upcoming procedure, abdominal liposuction with fat grafting to bilateral breasts and scar contracture release, scheduled for 11/12/2023 with Dr. Lowery.  The patient has not had problems with anesthesia.  She denies any personal or family history of blood clots or clotting disorder.  Denies any personal history of cancer, cardiac or pulmonary disease, varicosities, or nicotine use disorder.  She does report recent bunionectomy surgery last week and is currently ambulating with assistance of cam walker.  She states that she will be out of her cam walker and weightbearing in stiff soled shoe by the time of surgery.  Patient tells that she did not see any significant change after fat grafting and hopes that more can be injected this time around.  Discussed limitations with how much adipose tissue can be safely extracted from the abdomen, particularly after previous abdominal liposuction, before being clean and reinjected into bilateral breasts.  Discussed risks of surgery and she is agreeable to proceed.  Summary of Previous Visit: She met with Dr. Lowery 10/21/2023 to discuss her ongoing concerns after her bilateral mastectomy with subsequent reconstruction efforts.  Patient did endorse some improvement after recent fat grafting earlier this year.  Discussed additional fat grafting with release of scar contracture.  Patient agreeable to proceed.  Job: N/A  PMH Significant for:  Strong family history of breast cancer, bilateral mastectomy 2020 with failed reconstruction, bipolar disorder, GERD.   Past Medical History: Allergies: Allergies  Allergen Reactions   Penicillins Hives and Nausea And Vomiting   Tapentadol Hives and Other (See Comments)    Name brand is NUCYNTA    Lamictal  [Lamotrigine ] Rash   Codeine Hives   Oxycodone  Hcl Hives    Current Medications:  Current Outpatient Medications:    bisacodyl  (DULCOLAX) 5 MG EC tablet, Take 5 mg by mouth daily., Disp: , Rfl:    carboxymethylcellulose (REFRESH PLUS) 0.5 % SOLN, Place 1 drop into both eyes 2 (two) times daily as needed (dry eyes)., Disp: , Rfl:    clindamycin  (CLEOCIN ) 150 MG capsule, Take 3 capsules (450 mg total) by mouth 3 (three) times daily for 5 days., Disp: 45 capsule, Rfl: 0   HYDROcodone -acetaminophen  (NORCO/VICODIN) 5-325 MG tablet, Take by mouth., Disp: , Rfl:    linaclotide  (LINZESS ) 145 MCG CAPS capsule, TAKE 1 CAPSULE BY MOUTH EVERY DAY BEFORE BREAKFAST - INSURANCE WILL PAY ON 6/25, Disp: 90 capsule, Rfl: 3   lithium  carbonate (ESKALITH ) 450 MG ER tablet, Take 1 tablet (450 mg total) by mouth 3 (three) times daily., Disp: 90 tablet, Rfl: 2   Melatonin 10 MG CAPS, Take 10 mg by mouth at bedtime as needed., Disp: , Rfl:    ondansetron  (ZOFRAN ) 4 MG tablet, Take 1 tablet (4 mg total) by mouth every 8 (eight) hours as needed for nausea or vomiting., Disp: 20 tablet, Rfl: 0   ondansetron  (ZOFRAN -ODT) 4 MG disintegrating tablet, Take 1 tablet (4 mg  total) by mouth every 8 (eight) hours as needed for nausea or vomiting., Disp: 20 tablet, Rfl: 0   oxyCODONE  (ROXICODONE ) 5 MG immediate release tablet, Take 1 tablet (5 mg total) by mouth every 8 (eight) hours as needed for up to 5 days for severe pain (pain score 7-10)., Disp: 12 tablet, Rfl: 0   pantoprazole  (PROTONIX ) 40 MG tablet, TAKE 1 TABLET BY MOUTH EVERY DAY, Disp: 90 tablet, Rfl: 1   promethazine  (PHENERGAN ) 12.5 MG tablet, Take 1 tablet  (12.5 mg total) by mouth every 6 (six) hours as needed for nausea or vomiting., Disp: 30 tablet, Rfl: 0   QUEtiapine  (SEROQUEL ) 25 MG tablet, Take 1 tablet (25 mg total) by mouth 2 (two) times daily., Disp: 60 tablet, Rfl: 2   QUEtiapine  (SEROQUEL ) 300 MG tablet, Take 1 tablet (300 mg total) by mouth at bedtime., Disp: 30 tablet, Rfl: 2   sertraline  (ZOLOFT ) 25 MG tablet, Take 3 tablets (75 mg total) by mouth daily., Disp: 90 tablet, Rfl: 2  Past Medical Problems: 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   Anemia    Anxiety    Bipolar disorder (HCC)    Bipolar 1   Body mass index (BMI) 27.0-27.9, adult 02/18/2023   COVID-19    Depression    Family history of breast cancer 06/02/2018   Fibrocystic disease of both breasts    GERD (gastroesophageal reflux disease)    History of cervical dysplasia    laser surgery   Hyperglycemia 10/14/2020   Hypokalemia 12/03/2021   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   Insomnia    Left breast mass 06/30/2008   Low blood sugar 2001   pt checks BS in AM, controls with diet   Menorrhagia 05/09/2014   Need for immunization against influenza 01/12/2022   Routine cervical smear 06/20/2020   Routine health maintenance 05/09/2014   Routine screening for STI (sexually transmitted infection) 01/12/2022   S/P mastectomy, bilateral 10/27/2018   Surgery follow-up 10/19/2018   UTI (urinary tract infection) 11/01/2020   Vaginal Pap smear, abnormal     Past Surgical History: Past Surgical History:  Procedure Laterality Date   ANTERIOR CERVICAL DECOMP/DISCECTOMY FUSION N/A 07/24/2023   Procedure: ANTERIOR CERVICAL DECOMPRESSION/DISCECTOMY FUSION 1 LEVEL;  Surgeon: Beuford Anes, MD;  Location: [facility];  Service: Orthopedics;  Laterality: N/A;  ANTERIOR CERVICAL DECOMPRESSION FUSION CERVICAL 4- CERVICAL 5 WITH INSTRUMENTATION AND ALLOGRAFT   AUGMENTATION MAMMAPLASTY Bilateral    BREAST FIBROADENOMA SURGERY  2011, 2008    benign    BREAST IMPLANT REMOVAL Bilateral 10/31/2022   Procedure: REMOVAL BREAST IMPLANTS;  Surgeon: Lowery Estefana RAMAN, DO;  Location: [facility];  Service: Plastics;  Laterality: Bilateral;   BREAST RECONSTRUCTION WITH PLACEMENT OF TISSUE EXPANDER AND FLEX HD (ACELLULAR HYDRATED DERMIS) Bilateral 10/19/2018   Procedure: BREAST RECONSTRUCTION WITH PLACEMENT OF TISSUE EXPANDER AND FLEX HD (ACELLULAR HYDRATED DERMIS);  Surgeon: Lowery Estefana RAMAN, DO;  Location: [facility];  Service: Plastics;  Laterality: Bilateral;   CESAREAN SECTION  04/22/1993   HYSTEROSCOPY WITH NOVASURE N/A 01/02/2016   Procedure: NOVASURE;  Surgeon: Lynwood KANDICE Solomons, MD;  Location: [facility];  Service: Gynecology;  Laterality: N/A;   LIPOSUCTION WITH LIPOFILLING Bilateral 05/07/2023   Procedure: Excision of bilateral breast scar contracture and lipo filling bilateral breasts;  Surgeon: Lowery Estefana RAMAN, DO;  Location: [facility];  Service: Plastics;  Laterality: Bilateral;   MASTECTOMY Bilateral 2019   NIPPLE SPARING MASTECTOMY Bilateral 10/19/2018  Procedure: BILATERAL NIPPLE SPARING MASTECTOMY;  Surgeon: Curvin Deward MOULD, MD;  Location: [facility];  Service: General;  Laterality: Bilateral;   REMOVAL OF BILATERAL TISSUE EXPANDERS WITH PLACEMENT OF BILATERAL BREAST IMPLANTS Bilateral 12/30/2018   Procedure: REMOVAL OF BILATERAL TISSUE EXPANDERS WITH PLACEMENT OF BILATERAL BREAST IMPLANTS;  Surgeon: Lowery Estefana RAMAN, DO;  Location: [facility];  Service: Plastics;  Laterality: Bilateral;   TUBAL LIGATION  04/22/2001    Social History: Social History   Socioeconomic History   Marital status: Divorced    Spouse name: Not on file   Number of children: 3   Years of education: Not on file   Highest education level: Some college, no degree  Occupational History   Not on file  Tobacco Use   Smoking status: Never    Passive exposure: Never   Smokeless tobacco: Never  Vaping Use    Vaping status: Never Used  Substance and Sexual Activity   Alcohol use: No    Alcohol/week: 0.0 standard drinks of alcohol   Drug use: No   Sexual activity: Yes    Partners: Male    Birth control/protection: Surgical  Other Topics Concern   Not on file  Social History Narrative   Divorced, 3 daughters.  Works as a Lawyer at [employer] @ [employer]   Social Drivers of Health   Financial Resource Strain: Medium Risk (10/09/2023)   Overall Financial Resource Strain (CARDIA)    Difficulty of Paying Living Expenses: Somewhat hard  Food Insecurity: Food Insecurity Present (10/09/2023)   Hunger Vital Sign    Worried About Running Out of Food in the Last Year: Often true    Ran Out of Food in the Last Year: Often true  Transportation Needs: Unknown (10/09/2023)   PRAPARE - Transportation    Lack of Transportation (Medical): No    Lack of Transportation (Non-Medical): Patient declined  Physical Activity: Insufficiently Active (09/04/2023)   Exercise Vital Sign    Days of Exercise per Week: 3 days    Minutes of Exercise per Session: 20 min  Stress: Stress Concern Present (09/04/2023)   [employer] of Occupational Health - Occupational Stress Questionnaire    Feeling of Stress : To some extent  Social Connections: Unknown (10/09/2023)   Social Connection and Isolation Panel    Frequency of Communication with Friends and Family: Twice a week    Frequency of Social Gatherings with Friends and Family: Patient declined    Attends Religious Services: Patient declined    Database administrator or Organizations: No    Attends Engineer, structural: Not on file    Marital Status: Divorced  Intimate Partner Violence: Not At Risk (12/15/2022)   Humiliation, Afraid, Rape, and Kick questionnaire    Fear of Current or Ex-Partner: No    Emotionally Abused: No    Physically Abused: No    Sexually Abused: No    Family History: Family History  Problem Relation Age of Onset    Anxiety disorder Father    Depression Father    Diabetes Maternal Grandmother    Hypertension Paternal Grandfather    Cancer Maternal Aunt        breast   Breast cancer Maternal Aunt    Alcohol abuse Maternal Uncle    Alcohol abuse Cousin    Drug abuse Cousin    Stroke Neg Hx     Review of Systems: ROS Denies any recent chest pain, difficulty breathing, leg swelling, fevers.  Physical Exam: Vital Signs BP 122/82 (BP Location: Left Arm, Patient Position: Sitting, Cuff Size: Normal)   Pulse 79   Ht 5' 4 (1.626 m)   Wt 160 lb (72.6 kg)   LMP 12/18/2015 (Approximate)   SpO2 95%   BMI 27.46 kg/m   Physical Exam Constitutional:      General: Not in acute distress.    Appearance: Normal appearance. Not ill-appearing.  HENT:     Head: Normocephalic and atraumatic.  Eyes:     Pupils: Pupils are equal, round. Cardiovascular:     Rate and Rhythm: Normal rate.    Pulses: Normal pulses.  Pulmonary:     Effort: No respiratory distress or increased work of breathing.  Speaks in full sentences. Abdominal:     General: Abdomen is flat. No distension.   Musculoskeletal: Normal range of motion. No lower extremity swelling or edema. No varicosities.  CAM Walker right foot. Skin:    General: Skin is warm and dry.     Findings: No erythema or rash.  Neurological:     Mental Status: Alert and oriented to person, place, and time.  Psychiatric:        Mood and Affect: Mood normal.        Behavior: Behavior normal.    Assessment/Plan: The patient is scheduled for abdominal liposuction with fat grafting to bilateral breasts and scar contracture release with Dr. Lowery.  Risks, benefits, and alternatives of procedure discussed, questions answered and consent obtained.    Smoking Status: Non-smoker.  MRI per general surgery team 09/2022: BI-RADS Category 1: Negative.  History of bilateral mastectomies.    Caprini Score: 5; Risk Factors include: Age, BMI greater than 25, surgery  within the past month, and length of planned surgery. Recommendation for mechanical prophylaxis. Encourage early ambulation.  She states that she will have no issues with this from a podiatric standpoint.  Pictures obtained: 10/21/2023  Post-op Rx sent to pharmacy: Clindamycin , Zofran , oxycodone .  She has taken clindamycin  recently without issue or complication.  She also can take the oxycodone  if premedicated with Benadryl  and has had no issues with recent prescriptions.  Patient was provided with the General Surgical Risk consent document and Pain Medication Agreement prior to their appointment.  They had adequate time to read through the risk consent documents and Pain Medication Agreement. We also discussed them in person together during this preop appointment. All of their questions were answered to their satisfaction.  Recommended calling if they have any further questions.  Risk consent form and Pain Medication Agreement to be scanned into patient's chart.  The risks that can be encountered with and after liposuction were discussed and include the following but no limited to these:  Asymmetry, fluid accumulation, firmness of the area, fat necrosis with death of fat tissue, bleeding, infection, delayed healing, anesthesia risks, skin sensation changes, injury to structures including nerves, blood vessels, and muscles which may be temporary or permanent, allergies to tape, suture materials and glues, blood products, topical preparations or injected agents, skin and contour irregularities, skin discoloration and swelling, deep vein thrombosis, cardiac and pulmonary complications, pain, which may persist, persistent pain, recurrence of the lesion, poor healing of the incision, possible need for revisional surgery or staged procedures. Thiere can also be persistent swelling, poor wound healing, rippling or loose skin, worsening of cellulite, swelling, and thermal burn or heat injury from ultrasound with the  ultrasound-assisted lipoplasty technique. Any change in weight fluctuations can alter the outcome.  Electronically signed by: Honora Seip, PA-C 10/30/2023 9:28 AM

## 2023-10-30 NOTE — Progress Notes (Signed)
 Patient ID: Bethany [name], female    DOB: January 11, 1976, 48 y.o.   MRN: [id number]  Chief Complaint  Patient presents with   Pre-op Exam      ICD-10-CM   1. Acquired absence of both breasts  Z90.13        History of Present Illness: Bethany [name] is a 48 y.o.  female  with a history of bilateral mastectomy with implant-based reconstruction in 2020 followed by removal of bilateral breast implants with capsulectomies 2024 and subsequent fat grafting earlier this year.  She presents for preoperative evaluation for upcoming procedure, abdominal liposuction with fat grafting to bilateral breasts and scar contracture release, scheduled for 11/12/2023 with Dr. Lowery.  The patient has not had problems with anesthesia.  She denies any personal or family history of blood clots or clotting disorder.  Denies any personal history of cancer, cardiac or pulmonary disease, varicosities, or nicotine use disorder.  She does report recent bunionectomy surgery last week and is currently ambulating with assistance of cam walker.  She states that she will be out of her cam walker and weightbearing in stiff soled shoe by the time of surgery.  Patient tells that she did not see any significant change after fat grafting and hopes that more can be injected this time around.  Discussed limitations with how much adipose tissue can be safely extracted from the abdomen, particularly after previous abdominal liposuction, before being clean and reinjected into bilateral breasts.  Discussed risks of surgery and she is agreeable to proceed.  Summary of Previous Visit: She met with Dr. Lowery 10/21/2023 to discuss her ongoing concerns after her bilateral mastectomy with subsequent reconstruction efforts.  Patient did endorse some improvement after recent fat grafting earlier this year.  Discussed additional fat grafting with release of scar contracture.  Patient agreeable to proceed.  Job: N/A  PMH Significant for:  Strong family history of breast cancer, bilateral mastectomy 2020 with failed reconstruction, bipolar disorder, GERD.   Past Medical History: Allergies: Allergies  Allergen Reactions   Penicillins Hives and Nausea And Vomiting   Tapentadol Hives and Other (See Comments)    Name brand is NUCYNTA    Lamictal  [Lamotrigine ] Rash   Codeine Hives   Oxycodone  Hcl Hives    Current Medications:  Current Outpatient Medications:    bisacodyl  (DULCOLAX) 5 MG EC tablet, Take 5 mg by mouth daily., Disp: , Rfl:    carboxymethylcellulose (REFRESH PLUS) 0.5 % SOLN, Place 1 drop into both eyes 2 (two) times daily as needed (dry eyes)., Disp: , Rfl:    clindamycin  (CLEOCIN ) 150 MG capsule, Take 3 capsules (450 mg total) by mouth 3 (three) times daily for 5 days., Disp: 45 capsule, Rfl: 0   HYDROcodone -acetaminophen  (NORCO/VICODIN) 5-325 MG tablet, Take by mouth., Disp: , Rfl:    linaclotide  (LINZESS ) 145 MCG CAPS capsule, TAKE 1 CAPSULE BY MOUTH EVERY DAY BEFORE BREAKFAST - INSURANCE WILL PAY ON 6/25, Disp: 90 capsule, Rfl: 3   lithium  carbonate (ESKALITH ) 450 MG ER tablet, Take 1 tablet (450 mg total) by mouth 3 (three) times daily., Disp: 90 tablet, Rfl: 2   Melatonin 10 MG CAPS, Take 10 mg by mouth at bedtime as needed., Disp: , Rfl:    ondansetron  (ZOFRAN ) 4 MG tablet, Take 1 tablet (4 mg total) by mouth every 8 (eight) hours as needed for nausea or vomiting., Disp: 20 tablet, Rfl: 0   ondansetron  (ZOFRAN -ODT) 4 MG disintegrating tablet, Take 1 tablet (4 mg  total) by mouth every 8 (eight) hours as needed for nausea or vomiting., Disp: 20 tablet, Rfl: 0   oxyCODONE  (ROXICODONE ) 5 MG immediate release tablet, Take 1 tablet (5 mg total) by mouth every 8 (eight) hours as needed for up to 5 days for severe pain (pain score 7-10)., Disp: 12 tablet, Rfl: 0   pantoprazole  (PROTONIX ) 40 MG tablet, TAKE 1 TABLET BY MOUTH EVERY DAY, Disp: 90 tablet, Rfl: 1   promethazine  (PHENERGAN ) 12.5 MG tablet, Take 1 tablet  (12.5 mg total) by mouth every 6 (six) hours as needed for nausea or vomiting., Disp: 30 tablet, Rfl: 0   QUEtiapine  (SEROQUEL ) 25 MG tablet, Take 1 tablet (25 mg total) by mouth 2 (two) times daily., Disp: 60 tablet, Rfl: 2   QUEtiapine  (SEROQUEL ) 300 MG tablet, Take 1 tablet (300 mg total) by mouth at bedtime., Disp: 30 tablet, Rfl: 2   sertraline  (ZOLOFT ) 25 MG tablet, Take 3 tablets (75 mg total) by mouth daily., Disp: 90 tablet, Rfl: 2  Past Medical Problems: 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   Anemia    Anxiety    Bipolar disorder (HCC)    Bipolar 1   Body mass index (BMI) 27.0-27.9, adult 02/18/2023   COVID-19    Depression    Family history of breast cancer 06/02/2018   Fibrocystic disease of both breasts    GERD (gastroesophageal reflux disease)    History of cervical dysplasia    laser surgery   Hyperglycemia 10/14/2020   Hypokalemia 12/03/2021   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   Insomnia    Left breast mass 06/30/2008   Low blood sugar 2001   pt checks BS in AM, controls with diet   Menorrhagia 05/09/2014   Need for immunization against influenza 01/12/2022   Routine cervical smear 06/20/2020   Routine health maintenance 05/09/2014   Routine screening for STI (sexually transmitted infection) 01/12/2022   S/P mastectomy, bilateral 10/27/2018   Surgery follow-up 10/19/2018   UTI (urinary tract infection) 11/01/2020   Vaginal Pap smear, abnormal     Past Surgical History: Past Surgical History:  Procedure Laterality Date   ANTERIOR CERVICAL DECOMP/DISCECTOMY FUSION N/A 07/24/2023   Procedure: ANTERIOR CERVICAL DECOMPRESSION/DISCECTOMY FUSION 1 LEVEL;  Surgeon: Beuford Anes, MD;  Location: [facility];  Service: Orthopedics;  Laterality: N/A;  ANTERIOR CERVICAL DECOMPRESSION FUSION CERVICAL 4- CERVICAL 5 WITH INSTRUMENTATION AND ALLOGRAFT   AUGMENTATION MAMMAPLASTY Bilateral    BREAST FIBROADENOMA SURGERY  2011, 2008    benign    BREAST IMPLANT REMOVAL Bilateral 10/31/2022   Procedure: REMOVAL BREAST IMPLANTS;  Surgeon: Lowery Estefana RAMAN, DO;  Location: [facility];  Service: Plastics;  Laterality: Bilateral;   BREAST RECONSTRUCTION WITH PLACEMENT OF TISSUE EXPANDER AND FLEX HD (ACELLULAR HYDRATED DERMIS) Bilateral 10/19/2018   Procedure: BREAST RECONSTRUCTION WITH PLACEMENT OF TISSUE EXPANDER AND FLEX HD (ACELLULAR HYDRATED DERMIS);  Surgeon: Lowery Estefana RAMAN, DO;  Location: [facility];  Service: Plastics;  Laterality: Bilateral;   CESAREAN SECTION  04/22/1993   HYSTEROSCOPY WITH NOVASURE N/A 01/02/2016   Procedure: NOVASURE;  Surgeon: Lynwood KANDICE Solomons, MD;  Location: [facility];  Service: Gynecology;  Laterality: N/A;   LIPOSUCTION WITH LIPOFILLING Bilateral 05/07/2023   Procedure: Excision of bilateral breast scar contracture and lipo filling bilateral breasts;  Surgeon: Lowery Estefana RAMAN, DO;  Location: [facility];  Service: Plastics;  Laterality: Bilateral;   MASTECTOMY Bilateral 2019   NIPPLE SPARING MASTECTOMY Bilateral 10/19/2018  Procedure: BILATERAL NIPPLE SPARING MASTECTOMY;  Surgeon: Curvin Deward MOULD, MD;  Location: [facility];  Service: General;  Laterality: Bilateral;   REMOVAL OF BILATERAL TISSUE EXPANDERS WITH PLACEMENT OF BILATERAL BREAST IMPLANTS Bilateral 12/30/2018   Procedure: REMOVAL OF BILATERAL TISSUE EXPANDERS WITH PLACEMENT OF BILATERAL BREAST IMPLANTS;  Surgeon: Lowery Estefana RAMAN, DO;  Location: [facility];  Service: Plastics;  Laterality: Bilateral;   TUBAL LIGATION  04/22/2001    Social History: Social History   Socioeconomic History   Marital status: Divorced    Spouse name: Not on file   Number of children: 3   Years of education: Not on file   Highest education level: Some college, no degree  Occupational History   Not on file  Tobacco Use   Smoking status: Never    Passive exposure: Never   Smokeless tobacco: Never  Vaping Use    Vaping status: Never Used  Substance and Sexual Activity   Alcohol use: No    Alcohol/week: 0.0 standard drinks of alcohol   Drug use: No   Sexual activity: Yes    Partners: Male    Birth control/protection: Surgical  Other Topics Concern   Not on file  Social History Narrative   Divorced, 3 daughters.  Works as a Lawyer at [employer] @ [employer]   Social Drivers of Health   Financial Resource Strain: Medium Risk (10/09/2023)   Overall Financial Resource Strain (CARDIA)    Difficulty of Paying Living Expenses: Somewhat hard  Food Insecurity: Food Insecurity Present (10/09/2023)   Hunger Vital Sign    Worried About Running Out of Food in the Last Year: Often true    Ran Out of Food in the Last Year: Often true  Transportation Needs: Unknown (10/09/2023)   PRAPARE - Transportation    Lack of Transportation (Medical): No    Lack of Transportation (Non-Medical): Patient declined  Physical Activity: Insufficiently Active (09/04/2023)   Exercise Vital Sign    Days of Exercise per Week: 3 days    Minutes of Exercise per Session: 20 min  Stress: Stress Concern Present (09/04/2023)   [employer] of Occupational Health - Occupational Stress Questionnaire    Feeling of Stress : To some extent  Social Connections: Unknown (10/09/2023)   Social Connection and Isolation Panel    Frequency of Communication with Friends and Family: Twice a week    Frequency of Social Gatherings with Friends and Family: Patient declined    Attends Religious Services: Patient declined    Database administrator or Organizations: No    Attends Engineer, structural: Not on file    Marital Status: Divorced  Intimate Partner Violence: Not At Risk (12/15/2022)   Humiliation, Afraid, Rape, and Kick questionnaire    Fear of Current or Ex-Partner: No    Emotionally Abused: No    Physically Abused: No    Sexually Abused: No    Family History: Family History  Problem Relation Age of Onset    Anxiety disorder Father    Depression Father    Diabetes Maternal Grandmother    Hypertension Paternal Grandfather    Cancer Maternal Aunt        breast   Breast cancer Maternal Aunt    Alcohol abuse Maternal Uncle    Alcohol abuse Cousin    Drug abuse Cousin    Stroke Neg Hx     Review of Systems: ROS Denies any recent chest pain, difficulty breathing, leg swelling, fevers.  Physical Exam: Vital Signs BP 122/82 (BP Location: Left Arm, Patient Position: Sitting, Cuff Size: Normal)   Pulse 79   Ht 5' 4 (1.626 m)   Wt 160 lb (72.6 kg)   LMP 12/18/2015 (Approximate)   SpO2 95%   BMI 27.46 kg/m   Physical Exam Constitutional:      General: Not in acute distress.    Appearance: Normal appearance. Not ill-appearing.  HENT:     Head: Normocephalic and atraumatic.  Eyes:     Pupils: Pupils are equal, round. Cardiovascular:     Rate and Rhythm: Normal rate.    Pulses: Normal pulses.  Pulmonary:     Effort: No respiratory distress or increased work of breathing.  Speaks in full sentences. Abdominal:     General: Abdomen is flat. No distension.   Musculoskeletal: Normal range of motion. No lower extremity swelling or edema. No varicosities.  CAM Walker right foot. Skin:    General: Skin is warm and dry.     Findings: No erythema or rash.  Neurological:     Mental Status: Alert and oriented to person, place, and time.  Psychiatric:        Mood and Affect: Mood normal.        Behavior: Behavior normal.    Assessment/Plan: The patient is scheduled for abdominal liposuction with fat grafting to bilateral breasts and scar contracture release with Dr. Lowery.  Risks, benefits, and alternatives of procedure discussed, questions answered and consent obtained.    Smoking Status: Non-smoker.  MRI per general surgery team 09/2022: BI-RADS Category 1: Negative.  History of bilateral mastectomies.    Caprini Score: 5; Risk Factors include: Age, BMI greater than 25, surgery  within the past month, and length of planned surgery. Recommendation for mechanical prophylaxis. Encourage early ambulation.  She states that she will have no issues with this from a podiatric standpoint.  Pictures obtained: 10/21/2023  Post-op Rx sent to pharmacy: Clindamycin , Zofran , oxycodone .  She has taken clindamycin  recently without issue or complication.  She also can take the oxycodone  if premedicated with Benadryl  and has had no issues with recent prescriptions.  Patient was provided with the General Surgical Risk consent document and Pain Medication Agreement prior to their appointment.  They had adequate time to read through the risk consent documents and Pain Medication Agreement. We also discussed them in person together during this preop appointment. All of their questions were answered to their satisfaction.  Recommended calling if they have any further questions.  Risk consent form and Pain Medication Agreement to be scanned into patient's chart.  The risks that can be encountered with and after liposuction were discussed and include the following but no limited to these:  Asymmetry, fluid accumulation, firmness of the area, fat necrosis with death of fat tissue, bleeding, infection, delayed healing, anesthesia risks, skin sensation changes, injury to structures including nerves, blood vessels, and muscles which may be temporary or permanent, allergies to tape, suture materials and glues, blood products, topical preparations or injected agents, skin and contour irregularities, skin discoloration and swelling, deep vein thrombosis, cardiac and pulmonary complications, pain, which may persist, persistent pain, recurrence of the lesion, poor healing of the incision, possible need for revisional surgery or staged procedures. Thiere can also be persistent swelling, poor wound healing, rippling or loose skin, worsening of cellulite, swelling, and thermal burn or heat injury from ultrasound with the  ultrasound-assisted lipoplasty technique. Any change in weight fluctuations can alter the outcome.  Electronically signed by: Honora Seip, PA-C 10/30/2023 9:28 AM

## 2023-11-04 ENCOUNTER — Encounter (HOSPITAL_BASED_OUTPATIENT_CLINIC_OR_DEPARTMENT_OTHER): Payer: Self-pay | Admitting: Plastic Surgery

## 2023-11-04 ENCOUNTER — Other Ambulatory Visit: Payer: Self-pay

## 2023-11-06 ENCOUNTER — Encounter: Payer: Self-pay | Admitting: Podiatry

## 2023-11-06 ENCOUNTER — Ambulatory Visit (INDEPENDENT_AMBULATORY_CARE_PROVIDER_SITE_OTHER): Payer: MEDICAID

## 2023-11-06 ENCOUNTER — Ambulatory Visit: Payer: MEDICAID | Admitting: Podiatry

## 2023-11-06 DIAGNOSIS — Z9889 Other specified postprocedural states: Secondary | ICD-10-CM

## 2023-11-06 DIAGNOSIS — M2011 Hallux valgus (acquired), right foot: Secondary | ICD-10-CM | POA: Diagnosis not present

## 2023-11-06 DIAGNOSIS — M2041 Other hammer toe(s) (acquired), right foot: Secondary | ICD-10-CM

## 2023-11-09 NOTE — Progress Notes (Signed)
 She presents today date of surgery 10/17/2023 [facility] bunionectomy screw fixation and derotational arthroplasty fifth digit.  She states that she is doing really well denies fever chills nausea mobic muscle aches and pains.  Objective: Vital signs stable alert oriented x 3.  Dressed her dressing intact was removed demonstrates no erythema just mild edema no cellulitis drainage or odor she has good range of motion passively and actively of the hallux at the metatarsophalangeal joint.  Sutures are intact margins are coapted.  Assessment: Well-healing surgical foot.  Plan: Remove sutures today.  Encouraged range of motion exercises placed her in a Darco shoe with a compression anklet and I will follow-up with her in 2 to 4 weeks for another set of x-rays.

## 2023-11-12 ENCOUNTER — Encounter (HOSPITAL_BASED_OUTPATIENT_CLINIC_OR_DEPARTMENT_OTHER): Payer: Self-pay | Admitting: Plastic Surgery

## 2023-11-12 ENCOUNTER — Ambulatory Visit (HOSPITAL_BASED_OUTPATIENT_CLINIC_OR_DEPARTMENT_OTHER): Payer: MEDICAID | Admitting: Certified Registered"

## 2023-11-12 ENCOUNTER — Ambulatory Visit (HOSPITAL_BASED_OUTPATIENT_CLINIC_OR_DEPARTMENT_OTHER)
Admission: RE | Admit: 2023-11-12 | Discharge: 2023-11-12 | Disposition: A | Discharge status: Home / Self Care | Payer: MEDICAID | Attending: Plastic Surgery | Admitting: Plastic Surgery

## 2023-11-12 ENCOUNTER — Encounter (HOSPITAL_BASED_OUTPATIENT_CLINIC_OR_DEPARTMENT_OTHER)
Admission: RE | Disposition: A | Discharge status: Home / Self Care | Payer: Self-pay | Source: Home / Self Care | Attending: Plastic Surgery

## 2023-11-12 ENCOUNTER — Other Ambulatory Visit: Payer: Self-pay

## 2023-11-12 DIAGNOSIS — F419 Anxiety disorder, unspecified: Secondary | ICD-10-CM | POA: Diagnosis not present

## 2023-11-12 DIAGNOSIS — F319 Bipolar disorder, unspecified: Secondary | ICD-10-CM | POA: Insufficient documentation

## 2023-11-12 DIAGNOSIS — Z79899 Other long term (current) drug therapy: Secondary | ICD-10-CM | POA: Diagnosis not present

## 2023-11-12 DIAGNOSIS — N6489 Other specified disorders of breast: Secondary | ICD-10-CM | POA: Insufficient documentation

## 2023-11-12 DIAGNOSIS — K219 Gastro-esophageal reflux disease without esophagitis: Secondary | ICD-10-CM | POA: Insufficient documentation

## 2023-11-12 DIAGNOSIS — L905 Scar conditions and fibrosis of skin: Secondary | ICD-10-CM | POA: Diagnosis not present

## 2023-11-12 DIAGNOSIS — Z853 Personal history of malignant neoplasm of breast: Secondary | ICD-10-CM | POA: Diagnosis not present

## 2023-11-12 DIAGNOSIS — N651 Disproportion of reconstructed breast: Secondary | ICD-10-CM | POA: Diagnosis not present

## 2023-11-12 DIAGNOSIS — Z9013 Acquired absence of bilateral breasts and nipples: Secondary | ICD-10-CM | POA: Insufficient documentation

## 2023-11-12 DIAGNOSIS — N649 Disorder of breast, unspecified: Secondary | ICD-10-CM | POA: Diagnosis present

## 2023-11-12 DIAGNOSIS — Z5986 Financial insecurity: Secondary | ICD-10-CM | POA: Diagnosis not present

## 2023-11-12 HISTORY — PX: REVISION, RECONSTRUCTION, BREAST: SHX7641

## 2023-11-12 HISTORY — PX: LIPOSUCTION WITH LIPOFILLING: SHX6436

## 2023-11-12 SURGERY — REVISION, RECONSTRUCTION, BREAST
Anesthesia: General | Site: Breast | Laterality: Bilateral

## 2023-11-12 MED ORDER — DEXMEDETOMIDINE HCL IN NACL 80 MCG/20ML IV SOLN
INTRAVENOUS | Status: DC | PRN
Start: 2023-11-12 — End: 2023-11-12
  Administered 2023-11-12 (×2): 4 ug via INTRAVENOUS

## 2023-11-12 MED ORDER — ACETAMINOPHEN 500 MG PO TABS
ORAL_TABLET | ORAL | Status: AC
  Filled 2023-11-12: qty 2

## 2023-11-12 MED ORDER — PROPOFOL 10 MG/ML IV BOLUS
INTRAVENOUS | Status: DC | PRN
  Administered 2023-11-12: 200 mg via INTRAVENOUS

## 2023-11-12 MED ORDER — DEXAMETHASONE SODIUM PHOSPHATE 10 MG/ML IJ SOLN
INTRAMUSCULAR | Status: AC
  Filled 2023-11-12: qty 1

## 2023-11-12 MED ORDER — ONDANSETRON HCL 4 MG/2ML IJ SOLN
INTRAMUSCULAR | Status: AC
  Filled 2023-11-12: qty 2

## 2023-11-12 MED ORDER — ONDANSETRON HCL 4 MG/2ML IJ SOLN: 4.0000 mg | Freq: Once | INTRAMUSCULAR | Status: DC | PRN

## 2023-11-12 MED ORDER — OXYCODONE HCL 5 MG PO TABS: 5.0000 mg | ORAL_TABLET | Freq: Once | ORAL | Status: DC | PRN

## 2023-11-12 MED ORDER — LIDOCAINE 2% (20 MG/ML) 5 ML SYRINGE
INTRAMUSCULAR | Status: AC
  Filled 2023-11-12: qty 5

## 2023-11-12 MED ORDER — ACETAMINOPHEN 325 MG RE SUPP: 650.0000 mg | RECTAL | Status: DC | PRN

## 2023-11-12 MED ORDER — PROPOFOL 500 MG/50ML IV EMUL
INTRAVENOUS | Status: AC
  Filled 2023-11-12: qty 50

## 2023-11-12 MED ORDER — KETAMINE HCL 10 MG/ML IJ SOLN
INTRAMUSCULAR | Status: DC | PRN
  Administered 2023-11-12: 35 mg via INTRAVENOUS

## 2023-11-12 MED ORDER — FENTANYL CITRATE (PF) 100 MCG/2ML IJ SOLN
INTRAMUSCULAR | Status: DC | PRN
  Administered 2023-11-12: 50 ug via INTRAVENOUS
  Administered 2023-11-12: 100 ug via INTRAVENOUS
  Administered 2023-11-12: 50 ug via INTRAVENOUS

## 2023-11-12 MED ORDER — CHLORHEXIDINE GLUCONATE CLOTH 2 % EX PADS: 6.0000 | MEDICATED_PAD | Freq: Once | CUTANEOUS | Status: DC

## 2023-11-12 MED ORDER — MIDAZOLAM HCL 2 MG/2ML IJ SOLN
INTRAMUSCULAR | Status: AC
  Filled 2023-11-12: qty 2

## 2023-11-12 MED ORDER — AMISULPRIDE (ANTIEMETIC) 5 MG/2ML IV SOLN: 10.0000 mg | Freq: Once | INTRAVENOUS | Status: DC | PRN

## 2023-11-12 MED ORDER — CEFAZOLIN SODIUM-DEXTROSE 2-4 GM/100ML-% IV SOLN
INTRAVENOUS | Status: AC
  Filled 2023-11-12: qty 100

## 2023-11-12 MED ORDER — PROPOFOL 10 MG/ML IV BOLUS
INTRAVENOUS | Status: AC
Start: 2023-11-12 — End: 2023-11-12
  Filled 2023-11-12: qty 20

## 2023-11-12 MED ORDER — FENTANYL CITRATE (PF) 100 MCG/2ML IJ SOLN
INTRAMUSCULAR | Status: AC
Start: 2023-11-12 — End: 2023-11-12
  Filled 2023-11-12: qty 2

## 2023-11-12 MED ORDER — LIDOCAINE HCL 1 % IJ SOLN
INTRAVENOUS | Status: DC | PRN
  Administered 2023-11-12: 400 mL

## 2023-11-12 MED ORDER — GLYCOPYRROLATE 0.2 MG/ML IJ SOLN
INTRAMUSCULAR | Status: DC | PRN
  Administered 2023-11-12: .15 mg via INTRAVENOUS

## 2023-11-12 MED ORDER — HYDROMORPHONE HCL 1 MG/ML IJ SOLN
INTRAMUSCULAR | Status: AC
  Filled 2023-11-12: qty 0.5

## 2023-11-12 MED ORDER — MEPERIDINE HCL 25 MG/ML IJ SOLN: 6.2500 mg | INTRAMUSCULAR | Status: DC | PRN

## 2023-11-12 MED ORDER — CEFAZOLIN SODIUM-DEXTROSE 2-4 GM/100ML-% IV SOLN
2.0000 g | INTRAVENOUS | Status: AC
  Administered 2023-11-12: 2 g via INTRAVENOUS

## 2023-11-12 MED ORDER — HYDROMORPHONE HCL 1 MG/ML IJ SOLN
0.2500 mg | INTRAMUSCULAR | Status: DC | PRN
  Administered 2023-11-12 (×2): 0.5 mg via INTRAVENOUS
  Administered 2023-11-12: 0.25 mg via INTRAVENOUS
  Administered 2023-11-12: 0.5 mg via INTRAVENOUS
  Administered 2023-11-12: 0.25 mg via INTRAVENOUS

## 2023-11-12 MED ORDER — ONDANSETRON HCL 4 MG/2ML IJ SOLN
INTRAMUSCULAR | Status: DC | PRN
  Administered 2023-11-12: 4 mg via INTRAVENOUS

## 2023-11-12 MED ORDER — 0.9 % SODIUM CHLORIDE (POUR BTL) OPTIME
TOPICAL | Status: DC | PRN
  Administered 2023-11-12: 1000 mL

## 2023-11-12 MED ORDER — SODIUM CHLORIDE 0.9 % IV SOLN
250.0000 mL | INTRAVENOUS | Status: DC | PRN
Start: 2023-11-12 — End: 2023-11-12

## 2023-11-12 MED ORDER — LACTATED RINGERS IV SOLN
INTRAVENOUS | Status: AC | PRN
  Administered 2023-11-12 (×2): 1000 mL

## 2023-11-12 MED ORDER — FENTANYL CITRATE (PF) 100 MCG/2ML IJ SOLN: 25.0000 ug | INTRAMUSCULAR | Status: DC | PRN

## 2023-11-12 MED ORDER — TRAMADOL HCL 50 MG PO TABS: 50.0000 mg | ORAL_TABLET | Freq: Once | ORAL | Status: DC

## 2023-11-12 MED ORDER — LACTATED RINGERS IV SOLN: INTRAVENOUS | Status: DC

## 2023-11-12 MED ORDER — ACETAMINOPHEN 500 MG PO TABS
1000.0000 mg | ORAL_TABLET | Freq: Once | ORAL | Status: AC
  Administered 2023-11-12: 1000 mg via ORAL

## 2023-11-12 MED ORDER — DIPHENHYDRAMINE HCL 50 MG/ML IJ SOLN
INTRAMUSCULAR | Status: AC
  Filled 2023-11-12: qty 1

## 2023-11-12 MED ORDER — KETOROLAC TROMETHAMINE 30 MG/ML IJ SOLN: 30.0000 mg | Freq: Once | INTRAMUSCULAR | Status: DC | PRN

## 2023-11-12 MED ORDER — HYDROMORPHONE HCL 1 MG/ML IJ SOLN
INTRAMUSCULAR | Status: AC
Start: 2023-11-12 — End: 2023-11-12
  Filled 2023-11-12: qty 0.5

## 2023-11-12 MED ORDER — OXYCODONE HCL 5 MG/5ML PO SOLN: 5.0000 mg | Freq: Once | ORAL | Status: DC | PRN

## 2023-11-12 MED ORDER — BUPIVACAINE HCL 0.25 % IJ SOLN
INTRAMUSCULAR | Status: DC | PRN
  Administered 2023-11-12: 7 mL

## 2023-11-12 MED ORDER — FENTANYL CITRATE (PF) 100 MCG/2ML IJ SOLN
INTRAMUSCULAR | Status: AC
  Filled 2023-11-12: qty 2

## 2023-11-12 MED ORDER — LIDOCAINE HCL (CARDIAC) PF 100 MG/5ML IV SOSY
PREFILLED_SYRINGE | INTRAVENOUS | Status: DC | PRN
Start: 2023-11-12 — End: 2023-11-12
  Administered 2023-11-12: 30 mg via INTRAVENOUS

## 2023-11-12 MED ORDER — DIPHENHYDRAMINE HCL 50 MG/ML IJ SOLN
INTRAMUSCULAR | Status: DC | PRN
  Administered 2023-11-12: 25 mg via INTRAVENOUS

## 2023-11-12 MED ORDER — DEXMEDETOMIDINE HCL IN NACL 80 MCG/20ML IV SOLN
INTRAVENOUS | Status: AC
  Filled 2023-11-12: qty 20

## 2023-11-12 MED ORDER — MIDAZOLAM HCL 5 MG/5ML IJ SOLN
INTRAMUSCULAR | Status: DC | PRN
  Administered 2023-11-12: 2 mg via INTRAVENOUS

## 2023-11-12 MED ORDER — SODIUM CHLORIDE 0.9% FLUSH
3.0000 mL | INTRAVENOUS | Status: DC | PRN
Start: 2023-11-12 — End: 2023-11-12

## 2023-11-12 MED ORDER — ACETAMINOPHEN 325 MG PO TABS: 650.0000 mg | ORAL_TABLET | ORAL | Status: DC | PRN

## 2023-11-12 MED ORDER — PROPOFOL 500 MG/50ML IV EMUL
INTRAVENOUS | Status: DC | PRN
  Administered 2023-11-12: 150 ug/kg/min via INTRAVENOUS

## 2023-11-12 MED ORDER — KETAMINE HCL 50 MG/5ML IJ SOSY
PREFILLED_SYRINGE | INTRAMUSCULAR | Status: AC
  Filled 2023-11-12: qty 5

## 2023-11-12 MED ORDER — GLYCOPYRROLATE PF 0.2 MG/ML IJ SOSY
PREFILLED_SYRINGE | INTRAMUSCULAR | Status: AC
  Filled 2023-11-12: qty 1

## 2023-11-12 MED ORDER — SODIUM CHLORIDE 0.9% FLUSH: 3.0000 mL | Freq: Two times a day (BID) | INTRAVENOUS | Status: DC

## 2023-11-12 MED ORDER — PHENYLEPHRINE 80 MCG/ML (10ML) SYRINGE FOR IV PUSH (FOR BLOOD PRESSURE SUPPORT)
PREFILLED_SYRINGE | INTRAVENOUS | Status: AC
  Filled 2023-11-12: qty 10

## 2023-11-12 MED ORDER — DEXAMETHASONE SODIUM PHOSPHATE 4 MG/ML IJ SOLN
INTRAMUSCULAR | Status: DC | PRN
  Administered 2023-11-12: 8 mg via INTRAVENOUS

## 2023-11-12 SURGICAL SUPPLY — 48 items
BINDER ABDOMINAL 10 UNV 27-48 (MISCELLANEOUS) IMPLANT
BINDER ABDOMINAL 12 SM 30-45 (SOFTGOODS) IMPLANT
BINDER ABDOMINAL 9 SM 30-45 (SOFTGOODS) IMPLANT
BINDER BREAST LRG (GAUZE/BANDAGES/DRESSINGS) IMPLANT
BINDER BREAST MEDIUM (GAUZE/BANDAGES/DRESSINGS) IMPLANT
BINDER BREAST XLRG (GAUZE/BANDAGES/DRESSINGS) IMPLANT
BINDER BREAST XXLRG (GAUZE/BANDAGES/DRESSINGS) IMPLANT
BLADE HEX COATED 2.75 (ELECTRODE) IMPLANT
BLADE SURG 15 STRL LF DISP TIS (BLADE) ×1 IMPLANT
COVER BACK TABLE 60X90IN (DRAPES) ×1 IMPLANT
COVER MAYO STAND STRL (DRAPES) ×1 IMPLANT
DERMABOND ADVANCED .7 DNX12 (GAUZE/BANDAGES/DRESSINGS) ×1 IMPLANT
DRAPE LAPAROSCOPIC ABDOMINAL (DRAPES) ×1 IMPLANT
DRAPE UTILITY XL STRL (DRAPES) ×1 IMPLANT
DRESSING MEPILEX FLEX 4X4 (GAUZE/BANDAGES/DRESSINGS) IMPLANT
ELECTRODE REM PT RTRN 9FT ADLT (ELECTROSURGICAL) ×1 IMPLANT
EXTRACTOR CANIST REVOLVE STRL (CANNISTER) ×1 IMPLANT
GAUZE PAD ABD 8X10 STRL (GAUZE/BANDAGES/DRESSINGS) ×2 IMPLANT
GLOVE BIO SURGEON STRL SZ 6.5 (GLOVE) ×2 IMPLANT
GLOVE BIO SURGEON STRL SZ7.5 (GLOVE) IMPLANT
GLOVE BIOGEL PI IND STRL 7.0 (GLOVE) IMPLANT
GLOVE BIOGEL PI IND STRL 8 (GLOVE) IMPLANT
GOWN STRL REUS W/ TWL LRG LVL3 (GOWN DISPOSABLE) ×2 IMPLANT
GOWN STRL REUS W/ TWL XL LVL3 (GOWN DISPOSABLE) IMPLANT
IV LACTATED RINGERS 1000ML (IV SOLUTION) ×2 IMPLANT
LINER CANISTER 1000CC FLEX (MISCELLANEOUS) ×1 IMPLANT
NDL HYPO 25X1 1.5 SAFETY (NEEDLE) ×1 IMPLANT
NEEDLE HYPO 25X1 1.5 SAFETY (NEEDLE) ×1 IMPLANT
PACK BASIN DAY SURGERY FS (CUSTOM PROCEDURE TRAY) ×1 IMPLANT
PAD ALCOHOL SWAB (MISCELLANEOUS) ×1 IMPLANT
PAD FOAM SILICONE BACKED (GAUZE/BANDAGES/DRESSINGS) ×1 IMPLANT
PENCIL SMOKE EVACUATOR (MISCELLANEOUS) IMPLANT
SLEEVE SCD COMPRESS KNEE MED (STOCKING) ×1 IMPLANT
SPIKE FLUID TRANSFER (MISCELLANEOUS) IMPLANT
SPONGE T-LAP 18X18 ~~LOC~~+RFID (SPONGE) ×1 IMPLANT
STRIP SUTURE WOUND CLOSURE 1/2 (MISCELLANEOUS) ×1 IMPLANT
SUT MNCRL AB 4-0 PS2 18 (SUTURE) IMPLANT
SUT MON AB 5-0 PS2 18 (SUTURE) ×2 IMPLANT
SYR 10ML LL (SYRINGE) ×5 IMPLANT
SYR 3ML 18GX1 1/2 (SYRINGE) IMPLANT
SYR 50ML LL SCALE MARK (SYRINGE) ×1 IMPLANT
SYR CONTROL 10ML LL (SYRINGE) ×1 IMPLANT
SYR TOOMEY 50ML (SYRINGE) IMPLANT
TOWEL GREEN STERILE FF (TOWEL DISPOSABLE) ×2 IMPLANT
TRAY DSU PREP LF (CUSTOM PROCEDURE TRAY) ×1 IMPLANT
TUBING INFILTRATION IT-10001 (TUBING) ×1 IMPLANT
TUBING SET GRADUATE ASPIR 12FT (MISCELLANEOUS) ×1 IMPLANT
UNDERPAD 30X36 HEAVY ABSORB (UNDERPADS AND DIAPERS) ×2 IMPLANT

## 2023-11-12 NOTE — Transfer of Care (Signed)
 Immediate Anesthesia Transfer of Care Note  Patient: Bethany [name]  Procedure(s) Performed: Scar Contracture Release Bilateral Breast (Bilateral: Breast) Liposuction with bilateral breast fat grafting (Bilateral: Breast)  Patient Location: PACU  Anesthesia Type:General  Level of Consciousness: drowsy  Airway & Oxygen Therapy: Patient Spontanous Breathing and Patient connected to face mask oxygen  Post-op Assessment: Report given to RN and Post -op Vital signs reviewed and stable  Post vital signs: Reviewed and stable  Last Vitals:  Vitals Value Taken Time  BP 117/84 11/12/23 11:52  Temp    Pulse 78 11/12/23 11:56  Resp 8 11/12/23 11:56  SpO2 100 % 11/12/23 11:56  Vitals shown include unfiled device data.  Last Pain:  Vitals:   11/12/23 0914  TempSrc: Temporal  PainSc: 7          Complications: No notable events documented.

## 2023-11-12 NOTE — Anesthesia Postprocedure Evaluation (Signed)
 Anesthesia Post Note  Patient: Bethany [name]  Procedure(s) Performed: Scar Contracture Release Bilateral Breast (Bilateral: Breast) Liposuction with bilateral breast fat grafting (Bilateral: Breast)     Patient location during evaluation: PACU Anesthesia Type: General Level of consciousness: awake and alert Pain management: pain level controlled Vital Signs Assessment: post-procedure vital signs reviewed and stable Respiratory status: spontaneous breathing, nonlabored ventilation and respiratory function stable Cardiovascular status: blood pressure returned to baseline and stable Postop Assessment: no apparent nausea or vomiting Anesthetic complications: no   No notable events documented.  Last Vitals:  Vitals:   11/12/23 1311 11/12/23 1315  BP:  125/86  Pulse:  71  Resp:  16  Temp:    SpO2: 95% 94%    Last Pain:  Vitals:   11/12/23 1315  TempSrc:   PainSc: 4                  Butler Levander Pinal

## 2023-11-12 NOTE — Anesthesia Procedure Notes (Signed)
 Procedure Name: LMA Insertion Date/Time: 11/12/2023 10:51 AM  Performed by: Rhodia Debby MATSU, CRNAPre-anesthesia Checklist: Patient identified, Emergency Drugs available, Suction available, Patient being monitored and Timeout performed Patient Re-evaluated:Patient Re-evaluated prior to induction Oxygen Delivery Method: Circle system utilized Preoxygenation: Pre-oxygenation with 100% oxygen Induction Type: IV induction Ventilation: Mask ventilation without difficulty LMA: LMA inserted LMA Size: 4.0 Number of attempts: 1 Placement Confirmation: positive ETCO2 and breath sounds checked- equal and bilateral ETT to lip (cm): yes. Tube secured with: Tape Dental Injury: Teeth and Oropharynx as per pre-operative assessment  Comments: Smooth brief atraumatic dentition unchanged

## 2023-11-12 NOTE — Op Note (Signed)
 DATE OF OPERATION: 11/12/2023  LOCATION: [facility]  PREOPERATIVE DIAGNOSIS: breast asymmetry after reconstruction for breast cancer with scar contracture  POSTOPERATIVE DIAGNOSIS: Same  PROCEDURE:  Release of scar contracture right breast 100 cm2 Release of scar contracture left breast 100 cm2 Fat grafting to right breast 60 cc Fat grafting to left breast 50 cc  SURGEON: Estefana Fritter, DO  ASSISTANT: Honora Seip, PA  EBL: none  CONDITION: Stable  COMPLICATIONS: None  INDICATION: The patient, Bethany [name], is a 48 y.o. female born on [birth year], is here for treatment after bilateral mastectomies with reconstruction.  The patient decided to have the implants removed.  She has severe scar contracture and asymmetry.  The decision was made to fat graft to try to improve the condition and release the scar contracture.   PROCEDURE DETAILS:  The patient was seen prior to surgery and marked.  The IV antibiotics were given. The patient was taken to the operating room and given a general anesthetic. A standard time out was performed and all information was confirmed by those in the room. SCDs were placed.   The abdomen and breast were prepped and draped.  The local was placed in the incision site of the abdomen and each breast.  The #15 blade was used to make a small incision in the previous incision site of the abdomen and each breast.  Tumescent was placed in the abdomen.  The revolve was used to harvest the adipose.  It was prepared according to the manufacture guidelines.  All of the adipose was used.    The right breast had scar contracture from the previous capsule alone the inframammary fold and superior breast.  This was an area of 100 cm2.  The forked knife was used to release the 100 cm2 of scar contracture.  The harvested adipose was then injected into the right breast. A total of 60 cc was injected. The incision was closed with the 5-0 Monocryl.  The left  breast had scar contracture from the previous capsule alone the inframammary fold and lateral breast.  This was an area of 100 cm2.  The forked knife was used to release the 100 cm2 of scar contracture.  The harvested adipose was then injected into the right breast.  A total of 50 cc was injected. The incision was closed with the 5-0 Monocryl. The abdominal incision was closed with the 5-0 Monocryl.  The patient was allowed to wake up and taken to recovery room in stable condition at the end of the case. The family was notified at the end of the case.   The advanced practice practitioner (APP) assisted throughout the case.  The APP was essential in retraction and counter traction when needed to make the case progress smoothly.  This retraction and assistance made it possible to see the tissue plans for the procedure.  The assistance was needed for blood control, tissue re-approximation and assisted with closure of the incision site.

## 2023-11-12 NOTE — Discharge Instructions (Addendum)
 INSTRUCTIONS FOR AFTER BREAST SURGERY   You will likely have some questions about what to expect following your operation.  The following information will help you and your family understand what to expect when you are discharged from the hospital.  It is important to follow these guidelines to help ensure a smooth recovery and reduce complication.  Postoperative instructions include information on: diet, wound care, medications and physical activity.  AFTER SURGERY Expect to go home after the procedure.  In some cases, you may need to spend one night in the hospital for observation.  DIET Breast surgery does not require a specific diet.  However, the healthier you eat the better your body will heal. It is important to increasing your protein intake.  This means limiting the foods with sugar and carbohydrates.  Focus on vegetables and some meat.  If you have liposuction during your procedure be sure to drink water.  If your urine is bright yellow, then it is concentrated, and you need to drink more water.  As a general rule after surgery, you should have 8 ounces of water every hour while awake.  If you find you are persistently nauseated or unable to take in liquids let us  know.  NO TOBACCO USE or EXPOSURE.  This will slow your healing process and lead to a wound.  WOUND CARE Leave the binder on for 3 days . Use fragrance free soap like Dial, Dove or [place].   After 3 days you can remove the binder to shower. Once dry apply binder or sports bra. If you have liposuction you will have a soft and spongy dressing (Lipofoam) that helps prevent creases in your skin.  Remove before you shower and then replace it.  It is also available on [employer]. If you have steri-strips / tape directly attached to your skin leave them in place. It is OK to get these wet.   No baths, pools or hot tubs for four weeks. We close your incision to leave the smallest and best-looking scar. No ointment or creams on your incisions  for four weeks.  No Neosporin (Too many skin reactions).  A few weeks after surgery you can use Mederma and start massaging the scar. We ask you to wear your binder or sports bra for the first 6 weeks around the clock, including while sleeping. This provides added comfort and helps reduce the fluid accumulation at the surgery site. NO Ice or heating pads to the operative site.  You have a very high risk of a BURN before you feel the temperature change.  ACTIVITY No heavy lifting until cleared by the doctor.  This usually means no more than a half-gallon of milk.  It is OK to walk and climb stairs. Moving your legs is very important to decrease your risk of a blood clot.  It will also help keep you from getting deconditioned.  Every 1 to 2 hours get up and walk for 5 minutes. This will help with a quicker recovery back to normal.  Let pain be your guide so you don't do too much.  This time is for you to recover.  You will be more comfortable if you sleep and rest with your head elevated either with a few pillows under you or in a recliner.  No stomach sleeping for a three months.  WORK Everyone returns to work at different times. As a rough guide, most people take at least 1 - 2 weeks off prior to returning to work. If  you need documentation for your job, give the forms to the front staff at the clinic.  DRIVING Arrange for someone to bring you home from the hospital after your surgery.  You may be able to drive a few days after surgery but not while taking any narcotics or valium .  BOWEL MOVEMENTS Constipation can occur after anesthesia and while taking pain medication.  It is important to stay ahead for your comfort.  We recommend taking Milk of Magnesia (2 tablespoons; twice a day) while taking the pain pills.  MEDICATIONS You may be prescribed should start after surgery At your preoperative visit for you history and physical you may have been given the following medications: An antibiotic: Start  this medication when you get home and take according to the instructions on the bottle. Zofran  4 mg:  This is to treat nausea and vomiting.  You can take this every 6 hours as needed and only if needed. Valium  2 mg for breast cancer patients: This is for muscle tightness if you have an implant or expander. This will help relax your muscle which also helps with pain control.  This can be taken every 12 hours as needed. Don't drive after taking this medication. Norco (hydrocodone /acetaminophen ) 5/325 mg:  This is only to be used after you have taken the Motrin  or the Tylenol . Every 8 hours as needed.   Over the counter Medication to take: Ibuprofen  (Motrin ) 600 mg:  Take this every 6 hours.  If you have additional pain then take 500 mg of the Tylenol  every 8 hours.  Only take the Norco after you have tried these two. MiraLAX or Milk of Magnesia: Take this according to the bottle if you take the Norco.  WHEN TO CALL Call your surgeon's office if any of the following occur: Fever 101 degrees F or greater Excessive bleeding or fluid from the incision site. Pain that increases over time without aid from the medications Redness, warmth, or pus draining from incision sites Persistent nausea or inability to take in liquids Severe misshapen area that underwent the operation.   Post Anesthesia Home Care Instructions  Activity: Get plenty of rest for the remainder of the day. A responsible individual must stay with you for 24 hours following the procedure.  For the next 24 hours, DO NOT: -Drive a car -Advertising copywriter -Drink alcoholic beverages -Take any medication unless instructed by your physician -Make any legal decisions or sign important papers.  Meals: Start with liquid foods such as gelatin or soup. Progress to regular foods as tolerated. Avoid greasy, spicy, heavy foods. If nausea and/or vomiting occur, drink only clear liquids until the nausea and/or vomiting subsides. Call your  physician if vomiting continues.  Special Instructions/Symptoms: Your throat may feel dry or sore from the anesthesia or the breathing tube placed in your throat during surgery. If this causes discomfort, gargle with warm salt water. The discomfort should disappear within 24 hours.     Next dose of tylenol  if needed will be at 3:17pm

## 2023-11-12 NOTE — Anesthesia Preprocedure Evaluation (Addendum)
 Anesthesia Evaluation  Patient identified by MRN, date of birth, ID band Patient awake    Reviewed: Allergy & Precautions, NPO status , Patient's Chart, lab work & pertinent test results  Airway Mallampati: II  TM Distance: >3 FB Neck ROM: Full    Dental  (+) Teeth Intact, Dental Advisory Given   Pulmonary neg pulmonary ROS   Pulmonary exam normal breath sounds clear to auscultation       Cardiovascular negative cardio ROS Normal cardiovascular exam Rhythm:Regular Rate:Normal     Neuro/Psych  Headaches PSYCHIATRIC DISORDERS Anxiety Depression Bipolar Disorder      GI/Hepatic Neg liver ROS,GERD  Medicated and Controlled,,  Endo/Other  11/2022 episode of hypokalemic periodic paralysis:  presented with lethargy, altered mental status, potassium 2.2; discharged on potasium supplement. Suspected hypokalemia from diarrhea.    Renal/GU negative Renal ROS  negative genitourinary   Musculoskeletal  (+) Arthritis , Osteoarthritis,    Abdominal   Peds  Hematology negative hematology ROS (+)   Anesthesia Other Findings   Reproductive/Obstetrics negative OB ROS                              Anesthesia Physical Anesthesia Plan  ASA: 2  Anesthesia Plan: General   Post-op Pain Management: Tylenol  PO (pre-op)*, Precedex , Ketamine  IV* and Dilaudid  IV   Induction: Intravenous  PONV Risk Score and Plan: 3 and Ondansetron , Dexamethasone , Midazolam  and Treatment may vary due to age or medical condition  Airway Management Planned: LMA  Additional Equipment: None  Intra-op Plan:   Post-operative Plan: Extubation in OR  Informed Consent:   Plan Discussed with:   Anesthesia Plan Comments: (Takes benadryl  w/ all narcotics d/t itching)         Anesthesia Quick Evaluation

## 2023-11-12 NOTE — Interval H&P Note (Signed)
 History and Physical Interval Note:  11/12/2023 9:51 AM  Bethany [name]  has presented today for surgery, with the diagnosis of breast disease.  The various methods of treatment have been discussed with the patient and family. After consideration of risks, benefits and other options for treatment, the patient has consented to  Procedure(s) with comments: REVISION, RECONSTRUCTION, BREAST (Bilateral) - Bilateral breast fat grafting LIPOSUCTION, WITH FAT TRANSFER (Bilateral) as a surgical intervention.  The patient's history has been reviewed, patient examined, no change in status, stable for surgery.  I have reviewed the patient's chart and labs.  Questions were answered to the patient's satisfaction.     Bethany [name] [name]

## 2023-11-13 ENCOUNTER — Encounter (HOSPITAL_BASED_OUTPATIENT_CLINIC_OR_DEPARTMENT_OTHER): Payer: Self-pay | Admitting: Plastic Surgery

## 2023-11-14 ENCOUNTER — Encounter (HOSPITAL_COMMUNITY): Payer: Self-pay | Admitting: Clinical

## 2023-11-14 ENCOUNTER — Ambulatory Visit (HOSPITAL_COMMUNITY): Payer: MEDICAID | Admitting: Clinical

## 2023-11-14 DIAGNOSIS — F3160 Bipolar disorder, current episode mixed, unspecified: Secondary | ICD-10-CM | POA: Diagnosis not present

## 2023-11-14 DIAGNOSIS — F4310 Post-traumatic stress disorder, unspecified: Secondary | ICD-10-CM | POA: Diagnosis not present

## 2023-11-14 NOTE — Progress Notes (Signed)
 THERAPIST PROGRESS NOTE  Session Time: 8:05am-8:44am  Session #21  Virtual Visit via Video Note  I connected with Bethany [name] on 11/14/23 at  8:00 AM EDT by a video enabled telemedicine application and verified that I am speaking with the correct person using two identifiers.  Location: Patient: home Provider: home office   I discussed the limitations of evaluation and management by telemedicine and the availability of in person appointments. The patient expressed understanding and agreed to proceed.  I discussed the assessment and treatment plan with the patient. The patient was provided an opportunity to ask questions and all were answered. The patient agreed with the plan and demonstrated an understanding of the instructions.   The patient was advised to call back or seek an in-person evaluation if the symptoms worsen or if the condition fails to improve as anticipated.  I provided 39 minutes of non-face-to-face time during this encounter.  [name], LCSW    Participation Level: Active  Behavioral Response: Casual Alert Euthymic   Type of Therapy: Individual Therapy  Treatment Goals addressed:  LTG: Increase ability to regulate mood as evidenced by fewer highs and fewer lows that impact Bethany [name]'s life  LTG: Improve ability to see world as others do, as well as ability to accept that they do not see the world as she does.  STG: Be able to use reality testing on her auditory/visual hallucinations and paranoid beliefs  STG: Learn coping skills and increase resilience through application of CBT techniques and through processing of life in a shame framework  LTG: Increase ability to trust other people, tolerate being around people, and endure noise that currently renders patient incapable of being around other people for long.  LTG: Learn a variety of coping skills and demonstrate the ability to use them to decrease feelings of sadness, anger, and fear and increase  feelings of happiness, peace, and powerfulness AEB gauging those emotions on 1-10 scale.  STG: Learn breathing techniques and grounding techniques at an age-appropriate level and demonstrate mastery in session then report independent use of these skills out of session.  LTG: Explore personal core beliefs, rules and assumptions, and cognitive distortions through therapist using Cognitive Behavioral Therapy; learn how to develop replacement thoughts and challenge unhelpful thoughts.  STG: Learn DBT skills that might help with emotion regulation, distress tolerance, interpersonal effectiveness, and mindfulness  LTG: Work on physical health, increasing endurance of walking and decreasing areas of body she is dissatisfied, such as specifically reducing her abdomen size.  STG: Process life events to the extent needed so that will be able to move forward with various areas of life in a better frame of mind per self-report.   STG: Learn and practice communication techniques such as active listening, I statements, open-ended questions, fair fighting rules, initiating conversations;  learn about boundary types and how to implement/enforce them AEB self-report of use of same.  STG: Score less than 9 on the PHQ-9 and less than 5 on the GAD-7 as evidenced by intermittent administration of the questionnaires to determine progress in managing depression and anxiety.  ProgressTowards Goals: Progressing  Interventions: Assertiveness Training and Supportive   Summary: Bethany [name] is a 48 y.o. female who presents with Generalized Anxiety Disorder with panic attacks, Bipolar Disorder with psychotic features, and Post-Traumatic Stress Disorder.  She presented oriented x5 and stated she was feeling fair, but sleepy and sore.  CSW evaluated patient's medication compliance, use of coping tools, and self-care, as applicable.  She  provided an update on various aspects of her life that are normally discussed in therapy,  including her recent foot surgery, liposuction and breast reconstruction surgery, and ex-husband.  She provided background to CSW about her double mastectomy around 2015, the reasons for that, and the efforts to reconstruct that have failed.  We discussed the various procedures she has had in the last year or so, with one final foot surgery still to come.  She received positive strokes for following doctor's orders and doing what is necessary to keep her triggers at a minimum and utilize her recovery tools.  She laughed some and showed a good sense of humor, was sufficiently insightful to recognize that this was probably influenced by being on an opiate medication for the surgical pain.  She is looking forward to showering tomorrow and starting to massage her abdomen.  Her ex-husband who was blocked on her phone still managed to get through to her by having his brother call her then link him in.  The purpose for doing this was to tell her he was moved to a prison in [place] so is much closer, he is seeking mental health assistance while in prison, and to ask her to unblock him.  We talked about whether she is going to go see him since he is now so close, but she does not want to reward his bad behavior so does not intend to do so for now.  She will leave him unblocked for now as well, unless he acts up.  CSW described for her what genuine change would look like, I.e. not just saying he is getting help, but stopping himself if he becomes aggressive or saying he is sorry if he speaks harshly.  She does not understand why he feels he has to prove something to her, why he needs her approval.  Patient is able to see her own change and is happy with herself, was open to feedback about how different this is than when she first started therapy.  She remains okay with monthly visits.  Suicidal/Homicidal: No without intent/plan  Therapist Response:  Patient is progressing AEB engaging in scheduled therapy session.   Throughout the session, CSW gave patient the opportunity to explore thoughts and feelings associated with current life situations and past/present stressors.   CSW challenged patient gently and appropriately to consider different ways of looking at reported issues. CSW encouraged patient's expression of feelings and validated these using empathy, active listening, open body language, and unconditional positive regard.      Plan/Recommendations:  Return to therapy as scheduled on 8/26, continue to maintain appropriate boundary with husband even though she has unblocked his phone calls, follow instructions post-surgery for her recovery, continue to laugh  Diagnosis: Mixed bipolar I disorder (HCC)  Post-traumatic stress disorder, unspecified  Collaboration of Care: Psychiatrist AEB - psychiatrist can read therapy notes; therapist can and does read psychiatric notes prior to sessions  Patient/Guardian was advised Release of Information must be obtained prior to any record release in order to collaborate their care with an outside provider. Patient/Guardian was advised if they have not already done so to contact the registration department to sign all necessary forms in order for us  to release information regarding their care.   Consent: Patient/Guardian gives verbal consent for treatment and assignment of benefits for services provided during this visit. Patient/Guardian expressed understanding and agreed to proceed.   [name], LCSW 11/14/2023

## 2023-11-17 ENCOUNTER — Other Ambulatory Visit: Payer: Self-pay | Admitting: Surgical

## 2023-11-17 ENCOUNTER — Telehealth: Payer: Self-pay

## 2023-11-17 ENCOUNTER — Encounter: Payer: Self-pay | Admitting: Family Medicine

## 2023-11-17 MED ORDER — FLUCONAZOLE 150 MG PO TABS: 150.0000 mg | ORAL_TABLET | Freq: Once | ORAL | 0 refills | Status: AC

## 2023-11-17 NOTE — Telephone Encounter (Signed)
 Returned Ms. Canul's call regarding her bandages. She stated that after her shower she noticed she could see the drainage through the mepilex bordered dressing. She was wondering if she needed to take it off? I informed her that if she wanted to remove it, that was fine, and she could replace with another bandage.  Pt conveyed understanding.

## 2023-11-20 ENCOUNTER — Other Ambulatory Visit (HOSPITAL_COMMUNITY): Payer: Self-pay | Admitting: Psychiatry

## 2023-11-20 DIAGNOSIS — F4310 Post-traumatic stress disorder, unspecified: Secondary | ICD-10-CM

## 2023-11-20 DIAGNOSIS — G4700 Insomnia, unspecified: Secondary | ICD-10-CM

## 2023-11-20 DIAGNOSIS — F3160 Bipolar disorder, current episode mixed, unspecified: Secondary | ICD-10-CM

## 2023-11-20 NOTE — Progress Notes (Signed)
 Patient is a pleasant 48 year old female with history of bilateral mastectomy with implant-based reconstruction in 2020  and subsequent removal of implants and capsulectomies resulting  and scarring and contracture now s/p abdominal liposuction with fat grafting to bilateral breasts and scar release performed 11/12/2023 by Dr. Lowery who presents to clinic for postoperative follow-up.  Reviewed operative report and 60 cc was grafted to the right breast and 50 cc graft to the left breast.  The amount of abdominal adipose tissue  that could be harvested with liposuction was limited, possibly in part due to   Today,

## 2023-11-21 ENCOUNTER — Ambulatory Visit: Payer: MEDICAID | Admitting: Physician Assistant

## 2023-11-21 ENCOUNTER — Encounter: Payer: Self-pay | Admitting: Physician Assistant

## 2023-11-21 VITALS — BP 127/86 | HR 96 | Ht 64.0 in | Wt 151.4 lb

## 2023-11-21 DIAGNOSIS — Z9013 Acquired absence of bilateral breasts and nipples: Secondary | ICD-10-CM

## 2023-11-27 ENCOUNTER — Ambulatory Visit: Payer: MEDICAID

## 2023-11-27 ENCOUNTER — Encounter: Payer: Self-pay | Admitting: Podiatry

## 2023-11-27 ENCOUNTER — Ambulatory Visit (INDEPENDENT_AMBULATORY_CARE_PROVIDER_SITE_OTHER): Payer: MEDICAID | Admitting: Podiatry

## 2023-11-27 DIAGNOSIS — Z9889 Other specified postprocedural states: Secondary | ICD-10-CM

## 2023-11-27 DIAGNOSIS — M2011 Hallux valgus (acquired), right foot: Secondary | ICD-10-CM

## 2023-11-27 DIAGNOSIS — M2041 Other hammer toe(s) (acquired), right foot: Secondary | ICD-10-CM

## 2023-11-27 NOTE — Progress Notes (Signed)
 She presents today for her third postop visit she is status post [place] bunionectomy right foot with screw fixation and hammertoe repair fifth.  She states that is little more swollen and tender than it was last time but have been up on it a lot doing a lot more.  Objective: Vital signs are stable alert oriented x 3 pulses are palpable.  She does have some mild swelling over the first metatarsal phalangeal joint and first intermetatarsal space.  Radiographs taken today demonstrate well-healing osteotomy with screw fixation in good position.  She has good range of motion passively and actively of the first metatarsal phalangeal joint once prompted.  Assessment: Well-healing surgical foot.  Plan: Encouraged her to get back into her tennis shoe first thing in the morning she wear her compression anklet and her Darco shoe once the swelling begins throughout the day.  Follow-up with her in 1 month for x-rays

## 2023-12-05 ENCOUNTER — Encounter: Payer: MEDICAID | Admitting: Physician Assistant

## 2023-12-08 NOTE — Progress Notes (Unsigned)
 Patient is a pleasant 48 year old female with history of bilateral mastectomy with implant-based reconstruction in 2020 and subsequent removal of implants and capsulectomies resulting and scarring and contracture now s/p abdominal liposuction with fat grafting to bilateral breasts and scar release performed 11/12/2023 by Dr. Lowery who presents to clinic for postoperative follow-up.   She was last seen here in clinic on 11/21/2023.  At that time, she is doing well from a postoperative standpoint.  There was some improvement in regard to her breast contour.  No exam findings concerning for seroma or hematoma.  Emphasized the importance of continued activity modifications compressive garments.  Continue watchful waiting.  She was optimistic and pleased.    Today, patient is doing excellent from a postoperative standpoint.  She only has minimal abdominal discomfort, mostly on right side.  No discomfort in either of her reconstructed breasts.  She is overall very pleased with the outcome and feels as though there has been some improvement with the recent fat grafting.  She states that they do feel a bit fuller.  She is eager to discontinue the compressive faja.  On exam, she does have some improvement compared to her preoperative photos.  A couple sutures are removed without complication or difficulty.  Incisions are well-healed.  Her abdomen is soft, nondistended.  No TTP.  No rigidity.  No palpable underlying fluid collections.  Cleared from postoperative standpoint.  Follow-up with Dr. Lowery in 6 months for reevaluation.  However, she can certainly call the office should she have any questions or concerns in interim.  Picture(s) obtained of the patient and placed in the chart were with the patient's or guardian's permission.

## 2023-12-09 ENCOUNTER — Ambulatory Visit (INDEPENDENT_AMBULATORY_CARE_PROVIDER_SITE_OTHER): Payer: MEDICAID | Admitting: Physician Assistant

## 2023-12-09 VITALS — BP 132/82 | HR 85

## 2023-12-09 DIAGNOSIS — Z9013 Acquired absence of bilateral breasts and nipples: Secondary | ICD-10-CM

## 2023-12-16 ENCOUNTER — Ambulatory Visit (INDEPENDENT_AMBULATORY_CARE_PROVIDER_SITE_OTHER): Payer: MEDICAID | Admitting: Clinical

## 2023-12-16 DIAGNOSIS — F3160 Bipolar disorder, current episode mixed, unspecified: Secondary | ICD-10-CM | POA: Diagnosis not present

## 2023-12-16 DIAGNOSIS — F4310 Post-traumatic stress disorder, unspecified: Secondary | ICD-10-CM
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
## 2023-12-18 ENCOUNTER — Encounter: Payer: MEDICAID | Admitting: Physician Assistant

## 2023-12-18 ENCOUNTER — Encounter (HOSPITAL_COMMUNITY): Payer: Self-pay | Admitting: Clinical

## 2023-12-19 ENCOUNTER — Encounter: Payer: MEDICAID | Admitting: Physician Assistant

## 2023-12-19 ENCOUNTER — Other Ambulatory Visit (HOSPITAL_COMMUNITY): Payer: Self-pay | Admitting: Psychiatry

## 2023-12-19 DIAGNOSIS — G4700 Insomnia, unspecified: Secondary | ICD-10-CM

## 2023-12-19 DIAGNOSIS — F3160 Bipolar disorder, current episode mixed, unspecified: Secondary | ICD-10-CM

## 2023-12-30 ENCOUNTER — Encounter: Payer: Self-pay | Admitting: Podiatry

## 2023-12-30 ENCOUNTER — Ambulatory Visit (INDEPENDENT_AMBULATORY_CARE_PROVIDER_SITE_OTHER): Payer: MEDICAID | Admitting: Podiatry

## 2023-12-30 ENCOUNTER — Ambulatory Visit (INDEPENDENT_AMBULATORY_CARE_PROVIDER_SITE_OTHER): Payer: MEDICAID

## 2023-12-30 DIAGNOSIS — M2041 Other hammer toe(s) (acquired), right foot: Secondary | ICD-10-CM

## 2023-12-30 DIAGNOSIS — M2011 Hallux valgus (acquired), right foot: Secondary | ICD-10-CM

## 2023-12-30 DIAGNOSIS — Z9889 Other specified postprocedural states: Secondary | ICD-10-CM

## 2023-12-30 NOTE — Progress Notes (Signed)
 She presents today date of surgery 10/17/2023 status post [facility] bunionectomy with fifth toe repair.  She states that it is feeling better and she wants to get into still it is by the end of October.  Objective: Vital signs stable oriented x 3.  Pulses are palpable.  There is minimal edema fantastic range of motion of the first metatarsal phalangeal joint.  With very little restriction.  Radiographs taken today demonstrate well-healing osteotomy first metatarsal internal fixation in good position and tight.  Assessment: Well-healing surgical foot with recalcitrant edema.  Plan: At this point I will send her to physical therapy to help with the edema and to gain range on her motion.

## 2024-01-09 ENCOUNTER — Telehealth: Payer: Self-pay | Admitting: *Deleted

## 2024-01-09 NOTE — Telephone Encounter (Signed)
 Received on (01/08/2024) via of fax DME Standard Written Order from [facility] [place].  Requesting review,sign,and return.  Given to provider to complete.   DME Standard Written Order reviewed,signed, and faxed back to [facility] [place].  Confirmation received and copy scanned into the chart.//AR/CMA

## 2024-01-09 NOTE — Telephone Encounter (Signed)
 Received on (01/06/2024) via of fax DME Standard Written Order from [facility] [place].  Requesting review,sign,and return.  Given to provider to complete.  DME Standard Written Order reviewed,signed, and faxed back to [facility] [place].  Confirmation received and copy scanned into the chart.//AR/CMA

## 2024-01-13 ENCOUNTER — Encounter (HOSPITAL_COMMUNITY): Payer: Self-pay | Admitting: Clinical

## 2024-01-13 ENCOUNTER — Ambulatory Visit (HOSPITAL_COMMUNITY): Payer: MEDICAID | Admitting: Clinical

## 2024-01-13 DIAGNOSIS — F3160 Bipolar disorder, current episode mixed, unspecified: Secondary | ICD-10-CM | POA: Diagnosis not present

## 2024-01-13 DIAGNOSIS — F4310 Post-traumatic stress disorder, unspecified: Secondary | ICD-10-CM

## 2024-01-13 NOTE — Progress Notes (Unsigned)
 THERAPIST PROGRESS NOTE  Session Time: 10:03am-11:00am  Session #22  Virtual Visit via Video Note  I connected with Bethany [name] [place] on 01/13/24 at 10:00 AM EDT by a video enabled telemedicine application and verified that I am speaking with the correct person using two identifiers.  Location: Patient: home Provider: home office   I discussed the limitations of evaluation and management by telemedicine and the availability of in person appointments. The patient expressed understanding and agreed to proceed.  I discussed the assessment and treatment plan with the patient. The patient was provided an opportunity to ask questions and all were answered. The patient agreed with the plan and demonstrated an understanding of the instructions.   The patient was advised to call back or seek an in-person evaluation if the symptoms worsen or if the condition fails to improve as anticipated.  I provided 57 minutes of non-face-to-face time during this encounter.  Bethany JINNY Crest, LCSW    Participation Level: Active  Behavioral Response: Casual Alert Anxious and Irritable   Type of Therapy: Individual Therapy  Treatment Goals addressed: *** LTG: Increase ability to regulate mood as evidenced by fewer highs and fewer lows that impact Yarnell's life  LTG: Improve ability to see world as others do, as well as ability to accept that they do not see the world as she does.  STG: Be able to use reality testing on her auditory/visual hallucinations and paranoid beliefs  STG: Learn coping skills and increase resilience through application of CBT techniques and through processing of life in a shame framework  LTG: Increase ability to trust other people, tolerate being around people, and endure noise that currently renders patient incapable of being around other people for long.  LTG: Learn a variety of coping skills and demonstrate the ability to use them to decrease feelings of sadness, anger, and  fear and increase feelings of happiness, peace, and powerfulness AEB gauging those emotions on 1-10 scale.  STG: Learn breathing techniques and grounding techniques at an age-appropriate level and demonstrate mastery in session then report independent use of these skills out of session.  LTG: Explore personal core beliefs, rules and assumptions, and cognitive distortions through therapist using Cognitive Behavioral Therapy; learn how to develop replacement thoughts and challenge unhelpful thoughts.  STG: Learn DBT skills that might help with emotion regulation, distress tolerance, interpersonal effectiveness, and mindfulness  LTG: Work on physical health, increasing endurance of walking and decreasing areas of body she is dissatisfied, such as specifically reducing her abdomen size.  STG: Process life events to the extent needed so that will be able to move forward with various areas of life in a better frame of mind per self-report.   STG: Learn and practice communication techniques such as active listening, I statements, open-ended questions, fair fighting rules, initiating conversations;  learn about boundary types and how to implement/enforce them AEB self-report of use of same.  STG: Score less than 9 on the PHQ-9 and less than 5 on the GAD-7 as evidenced by intermittent administration of the questionnaires to determine progress in managing depression and anxiety.  ProgressTowards Goals: Progressing  Interventions: Assertiveness Training and Supportive ***  Summary: Bethany [name] is a 48 y.o. female who presents with Generalized Anxiety Disorder with panic attacks, Bipolar Disorder with psychotic features, and Post-Traumatic Stress Disorder.  She presented oriented x5 and stated she was feeling ***.  CSW evaluated patient's medication compliance, use of coping tools, and self-care, as applicable.  She provided an update  on various aspects of her life that are normally discussed in therapy,  including ***  Suicidal/Homicidal: No without intent/plan  Therapist Response:  Patient is progressing AEB engaging in scheduled therapy session.  Throughout the session, CSW gave patient the opportunity to explore thoughts and feelings associated with current life situations and past/present stressors.   CSW challenged patient gently and appropriately to consider different ways of looking at reported issues. CSW encouraged patient's expression of feelings and validated these using empathy, active listening, open body language, and unconditional positive regard.      Plan/Recommendations:  Return to therapy in 4 weeks to next scheduled appointment on 10/21, reflect on what was discussed in session, engage in self care behaviors as explored in session, do homework as assigned (***), and return to next session prepared to talk about experience with new coping methods.   Diagnosis: Mixed bipolar I disorder (HCC)  Post-traumatic stress disorder, unspecified  Collaboration of Care: Psychiatrist AEB - psychiatrist can read therapy notes; therapist can and does read psychiatric notes prior to sessions  Patient/Guardian was advised Release of Information must be obtained prior to any record release in order to collaborate their care with an outside provider. Patient/Guardian was advised if they have not already done so to contact the registration department to sign all necessary forms in order for us  to release information regarding their care.   Consent: Patient/Guardian gives verbal consent for treatment and assignment of benefits for services provided during this visit. Patient/Guardian expressed understanding and agreed to proceed.   Bethany JINNY Crest, LCSW 01/13/2024

## 2024-01-22 ENCOUNTER — Other Ambulatory Visit (HOSPITAL_COMMUNITY): Payer: Self-pay | Admitting: Psychiatry

## 2024-01-22 DIAGNOSIS — F3160 Bipolar disorder, current episode mixed, unspecified: Secondary | ICD-10-CM

## 2024-01-22 DIAGNOSIS — G4700 Insomnia, unspecified: Secondary | ICD-10-CM

## 2024-01-22 DIAGNOSIS — F4310 Post-traumatic stress disorder, unspecified: Secondary | ICD-10-CM
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
## 2024-01-26 ENCOUNTER — Other Ambulatory Visit (HOSPITAL_COMMUNITY): Payer: Self-pay | Admitting: *Deleted

## 2024-01-26 ENCOUNTER — Ambulatory Visit: Payer: MEDICAID | Attending: Podiatry

## 2024-01-26 DIAGNOSIS — G4700 Insomnia, unspecified: Secondary | ICD-10-CM

## 2024-01-26 DIAGNOSIS — M2011 Hallux valgus (acquired), right foot: Secondary | ICD-10-CM | POA: Diagnosis not present

## 2024-01-26 DIAGNOSIS — M79674 Pain in right toe(s): Secondary | ICD-10-CM | POA: Diagnosis present

## 2024-01-26 DIAGNOSIS — Z9889 Other specified postprocedural states: Secondary | ICD-10-CM | POA: Insufficient documentation

## 2024-01-26 DIAGNOSIS — M2041 Other hammer toe(s) (acquired), right foot: Secondary | ICD-10-CM | POA: Insufficient documentation

## 2024-01-26 DIAGNOSIS — F3160 Bipolar disorder, current episode mixed, unspecified: Secondary | ICD-10-CM

## 2024-01-26 DIAGNOSIS — F4310 Post-traumatic stress disorder, unspecified: Secondary | ICD-10-CM

## 2024-01-26 DIAGNOSIS — R2689 Other abnormalities of gait and mobility: Secondary | ICD-10-CM | POA: Insufficient documentation

## 2024-01-26 MED ORDER — QUETIAPINE FUMARATE 300 MG PO TABS: 300.0000 mg | ORAL_TABLET | Freq: Every day | ORAL | 0 refills | Status: DC

## 2024-01-26 MED ORDER — QUETIAPINE FUMARATE 25 MG PO TABS: 25.0000 mg | ORAL_TABLET | Freq: Two times a day (BID) | ORAL | 0 refills | Status: DC

## 2024-01-27 ENCOUNTER — Encounter (HOSPITAL_COMMUNITY): Payer: Self-pay | Admitting: Psychiatry

## 2024-01-27 ENCOUNTER — Telehealth (HOSPITAL_COMMUNITY): Payer: MEDICAID | Admitting: Psychiatry

## 2024-01-27 VITALS — Wt 148.0 lb

## 2024-01-27 DIAGNOSIS — F3160 Bipolar disorder, current episode mixed, unspecified: Secondary | ICD-10-CM | POA: Diagnosis not present

## 2024-01-27 DIAGNOSIS — F4310 Post-traumatic stress disorder, unspecified: Secondary | ICD-10-CM

## 2024-01-27 DIAGNOSIS — G4700 Insomnia, unspecified: Secondary | ICD-10-CM | POA: Diagnosis not present

## 2024-01-27 MED ORDER — SERTRALINE HCL 25 MG PO TABS: 75.0000 mg | ORAL_TABLET | Freq: Every day | ORAL | 2 refills | Status: DC

## 2024-01-27 MED ORDER — LITHIUM CARBONATE ER 450 MG PO TBCR: 450.0000 mg | EXTENDED_RELEASE_TABLET | Freq: Three times a day (TID) | ORAL | 2 refills | Status: DC

## 2024-01-27 MED ORDER — QUETIAPINE FUMARATE 25 MG PO TABS: 25.0000 mg | ORAL_TABLET | Freq: Two times a day (BID) | ORAL | 2 refills | Status: DC

## 2024-01-27 MED ORDER — QUETIAPINE FUMARATE 300 MG PO TABS: 300.0000 mg | ORAL_TABLET | Freq: Every day | ORAL | 2 refills | Status: DC

## 2024-01-27 NOTE — Progress Notes (Signed)
 [facility] MD Virtual Progress Note   Patient Location: Home Provider Location: Home Office  I connect with patient by video and verified that I am speaking with correct person by using two identifiers. I discussed the limitations of evaluation and management by telemedicine and the availability of in person appointments. I also discussed with the patient that there may be a patient responsible charge related to this service. The patient expressed understanding and agreed to proceed.  Bethany [name] [id number] 48 y.o.  01/27/2024 9:28 AM  History of Present Illness:  Patient is evaluated by video session.  She reported things are better as taking extra Seroquel  helping her paranoia, irritability and mood swings.  She still feel anxious when she go outside.  She denies any crying spells or any feeling of hopelessness.  She still gets sometimes irritable but overall feels the medicine helping her mania and psychosis.  She also trying to lose weight and watching her calorie intake.  She lost 3 pounds since the last visit.  Recently she has procedure for breast reconstruction which went well.  Patient told father diagnosed with prostate cancer but refusing treatment and she is disappointed.  Patient lives with her parents who are very supportive.  Her children also comes and visit her.  She is taking Zoloft , lithium , Seroquel .  She also takes melatonin 20 mg at bedtime.  She tried to cut down the melatonin but could not sleep very well.  Her ankle/foot pain is better and doing physical therapy has been helpful.  She does not need to go to in person for physical therapy as she can do at home.  She is able to walk better than before.  She has no tremor or shakes or any EPS.  She is in therapy with Ms. Christine.  She denies any active or passive suicidal thoughts or homicidal thoughts.  Past Psychiatric History: Saw therapist in [facility] after brother killed.  Took Zoloft  and lorazepam   by PCP.  Took Lamictal  but stopped after the rash.  Trazodone  helped but wanted to try something for anxiety and it was switched to hydroxyzine .  No h/o inpatient treatment, suicidal attempt, psychosis or any mania.  H/O irritability, anger and mood swing.     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   carboxymethylcellulose (REFRESH PLUS) 0.5 % SOLN Place 1 drop into both eyes 2 (two)  times daily as needed (dry eyes).   linaclotide  (LINZESS ) 145 MCG CAPS capsule TAKE 1 CAPSULE BY MOUTH EVERY DAY BEFORE BREAKFAST - INSURANCE WILL PAY ON 6/25   lithium  carbonate (ESKALITH ) 450 MG ER tablet Take 1 tablet (450 mg total) by mouth 3 (three) times daily.   Melatonin 10 MG CAPS Take 10 mg by mouth at bedtime as needed.   ondansetron  (ZOFRAN ) 4 MG tablet Take 1 tablet (4 mg total) by mouth every 8 (eight) hours as needed for nausea or vomiting.   ondansetron  (ZOFRAN -ODT) 4 MG  disintegrating tablet Take 1 tablet (4 mg total) by mouth every 8 (eight) hours as needed for nausea or vomiting.   pantoprazole  (PROTONIX ) 40 MG tablet TAKE 1 TABLET BY MOUTH EVERY DAY   promethazine  (PHENERGAN ) 12.5 MG tablet Take 1 tablet (12.5 mg total) by mouth every 6 (six) hours as needed for nausea or vomiting.   QUEtiapine  (SEROQUEL ) 25 MG tablet Take 1 tablet (25 mg total) by mouth 2 (two) times daily.   QUEtiapine  (SEROQUEL ) 300 MG tablet Take 1 tablet (300 mg total) by mouth at bedtime.   sertraline  (ZOLOFT ) 25 MG tablet Take 3 tablets (75 mg total) by mouth daily.   No facility-administered encounter medications on file as of 01/27/2024.    No results found for this or any previous visit (from the past 2160 hours).   Psychiatric Specialty Exam: Physical Exam  Review of Systems  Weight 148 lb (67.1 kg), last menstrual period 12/18/2015.There is no height or weight on file to calculate BMI.  General Appearance: Casual  Eye Contact:  Good  Speech:  Normal Rate  Volume:  Normal  Mood:  Anxious  Affect:  Appropriate  Thought Process:  Goal Directed  Orientation:  Full (Time, Place, and Person)  Thought Content:  WDL  Suicidal Thoughts:  No  Homicidal Thoughts:  No  Memory:  Immediate;   Good Recent;   Good Remote;   Fair  Judgement:  Intact  Insight:  Good  Psychomotor Activity:  Normal  Concentration:  Concentration: Fair and Attention Span: Fair  Recall:  Good  Fund of Knowledge:  Good  Language:  Good  Akathisia:  No  Handed:  Right  AIMS (if indicated):     Assets:  Communication Skills Desire for Improvement Housing Social Support  ADL's:  Intact  Cognition:  WNL  Sleep:  6-7 hrs with melatonin 20 mg       10/10/2023   11:37 AM 09/08/2023    9:45 AM 08/21/2023    1:34 PM 03/05/2023    8:39 AM 02/18/2023    2:30 PM  Depression screen PHQ 2/9  Decreased Interest 0 1 0 0 2  Down, Depressed, Hopeless 0 0 2 2 3   PHQ - 2 Score 0 1 2 2 5   Altered sleeping  0 0 3 3 3   Tired, decreased energy 0 0 2 2 3   Change in appetite 0 0 1 3 0  Feeling bad or failure about yourself  0 0 0 0 2  Trouble concentrating 0 2 0 3 3  Moving slowly or fidgety/restless 0 3 3 2 2   Suicidal thoughts 0 0 0 0 0  PHQ-9 Score 0 6 11 15 18   Difficult doing work/chores  Somewhat difficult   Extremely dIfficult    Assessment/Plan: Mixed bipolar I disorder (HCC) - Plan: lithium  carbonate (ESKALITH ) 450 MG ER tablet, QUEtiapine  (SEROQUEL ) 25 MG tablet, QUEtiapine  (SEROQUEL ) 300 MG tablet, sertraline  (ZOLOFT ) 25 MG  tablet  Insomnia, unspecified type - Plan: QUEtiapine  (SEROQUEL ) 25 MG tablet, QUEtiapine  (SEROQUEL ) 300 MG tablet, sertraline  (ZOLOFT ) 25 MG tablet  Post-traumatic stress disorder, unspecified - Plan: QUEtiapine  (SEROQUEL ) 25 MG tablet, sertraline  (ZOLOFT ) 25 MG tablet  Patient is a 48 year old female with history of migraine, chronic back pain, hypothyroidism, bipolar disorder, chronic insomnia and PTSD.  Review current medication.  She is taking Zoloft  75 mg, lithium  450 mg 3 times a day, Seroquel  300 mg at bedtime and 25 mg twice a day.  She is also on melatonin 20 mg.  So far no major concern or side effects of the medication.  Will do lithium  level.  Encouraged to continue therapy with Ms. Christine.  Recommend to call back if she is any question or any concern.  Follow-up in 3 months   Follow Up Instructions:     I discussed the assessment and treatment plan with the patient. The patient was provided an opportunity to ask questions and all were answered. The patient agreed with the plan and demonstrated an understanding of the instructions.   The patient was advised to call back or seek an in-person evaluation if the symptoms worsen or if the condition fails to improve as anticipated.    Collaboration of Care: Other provider involved in patient's care AEB notes are available in epic to review  Patient/Guardian was advised Release of Information must be  obtained prior to any record release in order to collaborate their care with an outside provider. Patient/Guardian was advised if they have not already done so to contact the registration department to sign all necessary forms in order for us  to release information regarding their care.   Consent: Patient/Guardian gives verbal consent for treatment and assignment of benefits for services provided during this visit. Patient/Guardian expressed understanding and agreed to proceed.     Total encounter time 21 minutes which includes face-to-face time, chart reviewed, care coordination, order entry and documentation during this encounter.   Note: This document was prepared by [employer] voice dictation technology and any errors that results from this process are unintentional.    Leni ONEIDA Client, MD 01/27/2024

## 2024-02-04 ENCOUNTER — Other Ambulatory Visit: Payer: Self-pay | Admitting: Family Medicine

## 2024-02-10 ENCOUNTER — Encounter: Payer: Self-pay | Admitting: Podiatry

## 2024-02-10 ENCOUNTER — Ambulatory Visit (INDEPENDENT_AMBULATORY_CARE_PROVIDER_SITE_OTHER): Payer: MEDICAID | Admitting: Podiatry

## 2024-02-10 ENCOUNTER — Encounter (HOSPITAL_COMMUNITY): Payer: Self-pay | Admitting: Clinical

## 2024-02-10 ENCOUNTER — Ambulatory Visit (HOSPITAL_COMMUNITY): Payer: MEDICAID | Admitting: Clinical

## 2024-02-10 DIAGNOSIS — M2012 Hallux valgus (acquired), left foot: Secondary | ICD-10-CM | POA: Diagnosis not present

## 2024-02-10 DIAGNOSIS — F4310 Post-traumatic stress disorder, unspecified: Secondary | ICD-10-CM

## 2024-02-10 DIAGNOSIS — M2042 Other hammer toe(s) (acquired), left foot: Secondary | ICD-10-CM | POA: Diagnosis not present

## 2024-02-10 DIAGNOSIS — F3160 Bipolar disorder, current episode mixed, unspecified: Secondary | ICD-10-CM | POA: Diagnosis not present

## 2024-02-10 NOTE — Progress Notes (Unsigned)
 THERAPIST PROGRESS NOTE  Session Time: 10:02am-11:02am  Session #24  Virtual Visit via Video Note  I connected with Bethany [name] on 02/10/24 at 10:00 AM EDT by a video enabled telemedicine application and verified that I am speaking with the correct person using two identifiers.  Location: Patient: home Provider: home office   I discussed the limitations of evaluation and management by telemedicine and the availability of in person appointments. The patient expressed understanding and agreed to proceed.  I discussed the assessment and treatment plan with the patient. The patient was provided an opportunity to ask questions and all were answered. The patient agreed with the plan and demonstrated an understanding of the instructions.   The patient was advised to call back or seek an in-person evaluation if the symptoms worsen or if the condition fails to improve as anticipated.  I provided 60 minutes of non-face-to-face time during this encounter.  Bethany JINNY Crest, LCSW    Participation Level: Active  Behavioral Response: Casual Alert Irritable   Type of Therapy: Individual Therapy  Treatment Goals addressed:  New treatment goals established, current goals reviewed:  STG: Learn breathing techniques and grounding techniques at an age-appropriate level and demonstrate mastery in session then report independent use of these skills out of session.  LTG: Learn and practice communication techniques such as active listening, I statements, open-ended questions, reflective listening, assertiveness, fair fighting rules, initiating conversations, and more as necessary and taught in session.    STG: Score less than 9 on the PHQ-9 and less than 5 on the GAD-7 as evidenced by intermittent administration of the questionnaires to determine progress in managing depression and anxiety.  STG: Process life events to the extent needed so that will be able to move forward with various areas of  life in a better frame of mind per self-report of improved satisfaction with life 5 out of 7 days over the next 6 months.  STG:  Learn about 3 boundary styles and 6-8 types, how to implement them, and how to enforce them, then report feeling more empowered and content with being able to maintain more helpful, appropriate boundaries in the future for a more balanced result. STG: Become able to utilize reality-testing in order to identify paranoid and/or delusional thoughts 5 days out of 7 for the next 26 weeks.  STG:  Explore personal core beliefs, rules and assumptions, and cognitive distortions through therapist using Cognitive Behavioral Therapy; learn about replacement thoughts, Behavioral Activation and Acting As If AEB using 2 times weekly.  STG: Learn at least 2 emotion regulation strategies, 2 distress tolerance skills, 2 interpersonal effectiveness techniques, and 2 mindfulness practices; use them in session and in life situations to improve results and satisfaction. (Anxiety) LTG: Increase ability to tolerate being around people and endure noise that currently renders patient incapable of being around other people for long AEB by self-report that she can do at least 1-2 to 1 hour per event before leaving.  LTG: Work on physical health, increasing ability to endure walking up to 1/2 hour 3-5 times a week and gain better nutrition by learning about healthy foods and eating healthier meals 5-6 days a week AEB improved satisfaction in fitness level.  LTG:  Work to learn 10+ coping skills from models like CBT, Stages of Change, DBT, shame resilience theory, ACT, SFBT, MI, trauma-informed therapy and others to be able to manage mental health symptoms, AEB practicing out of session and reporting back.   ProgressTowards Goals: Progressing  Interventions:  CBT, Assertiveness Training, and Supportive   Summary: Bethany [name] is a 48 y.o. female who presents with Generalized Anxiety Disorder with panic  attacks, Bipolar Disorder with psychotic features, and Post-Traumatic Stress Disorder.  She presented oriented x5 and stated she was feeling sad and frustrated.  CSW evaluated patient's medication compliance, use of coping tools, and self-care, as applicable.  She provided an update on various aspects of her life that are normally discussed in therapy, including her uncle's death on 12-Mar-2024, the funeral date resulting cancellation of her birthday dinner, and the conflict with the event planner about getting any part of her money back that she has spent for the event.  She called herself a lot of names during this process that showed her self-blamed such as stupid, jackass, bad decision-maker and we discussed how it is not helpful because   Suicidal/Homicidal: No without intent/plan  Therapist Response:  Patient is progressing AEB engaging in scheduled therapy session.  Throughout the session, CSW gave patient the opportunity to explore thoughts and feelings associated with current life situations and past/present stressors.   CSW challenged patient gently and appropriately to consider different ways of looking at reported issues. CSW encouraged patient's expression of feelings and validated these using empathy, active listening, open body language, and unconditional positive regard.      Plan/Recommendations:  Return to therapy in 4 weeks to next scheduled appointment on 11/18, reflect on what was discussed in session, engage in self care behaviors as explored in session, and return to next session prepared to talk about experience with new coping methods.   Diagnosis: Mixed bipolar I disorder (HCC)  Post-traumatic stress disorder, unspecified  Collaboration of Care: Psychiatrist AEB - psychiatrist can read therapy notes; therapist can and does read psychiatric notes prior to sessions  Patient/Guardian was advised Release of Information must be obtained prior to any record release in order to  collaborate their care with an outside provider. Patient/Guardian was advised if they have not already done so to contact the registration department to sign all necessary forms in order for us  to release information regarding their care.   Consent: Patient/Guardian gives verbal consent for treatment and assignment of benefits for services provided during this visit. Patient/Guardian expressed understanding and agreed to proceed.   Bethany JINNY Crest, LCSW 02/10/2024

## 2024-02-11 NOTE — Progress Notes (Signed)
 She presents today for follow-up of her right foot.  She states that is doing great but my left 1 is really starting to bother me the outside and the inside is starting to affect my ability to wear my shoes.  I have tried different shoes and having to stay in sandals or flip-flops at this point.  Have not been able to wear nice shoes for parties and I would like to see about having these fixed is just something that is affecting her ability to perform her daily activities and maintain her general good health.  Objective: I have reviewed her past medical history medications allergies surgery social history and review of systems.  Pulses are strongly palpable.  Neurologic sensorium is intact.  Deep tendon reflexes are intact muscle strength is normal symmetrical.  Cutaneous evaluation is demonstrating supple well-hydrated cutis no open lesions or wounds though she does have some reactive tissue overlying the fifth digit at the PIP joint dorsally.  She has tenderness on palpation and range of motion of the first metatarsal phalangeal joint there is mild crepitation present left.  The hypertrophic medial condyle or the bunion deformity is reducible in the toe appears to be tracking but not track bound.  Radiographs taken of the left foot demonstrate an increase in the first intermetatarsal angle greater than normal value hallux abductus angle is greater than normal value hypertrophic medial condyle is present she has some joint space narrowing on the lateral aspect of the joint and the toe is slightly shifted off-center more than likely resulting in some of this narrowing.  Right foot demonstrates great range of motion of the first metatarsal phalangeal joint no crepitation is noted.  Minimal edema no cellulitis drainage or odor fifth toe is healing well.  Assessment: Well-healing surgical foot right.  Painful bunion deformity left.  Painful hammertoe deformity fifth left.  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  Plan: Discussed etiology pathology conservative surgical therapies at this point discussed surgical intervention consisting of an Austin type bunion repair left foot as well as a derotational arthroplasty fifth digit left foot.  We went over the consent form today on byline number by number  Numbness but she soffit regarding these procedures answered all questions regarding these procedures best my ability layman's terms.  We did discuss the possible complications and side effects associated with this type of procedure she understands and is amenable to it she knows that it is not limited to postop pain bleeding swelling faction recurrence need for further surgery overcorrection under correction loss of digit loss limb loss of life.  She signed all 3 pages of the consent form we will follow-up with her in the near future for surgery hopefully.  Remember to ask how her father is doing.  He was in an automobile accident

## 2024-02-24 ENCOUNTER — Telehealth: Payer: Self-pay | Admitting: Podiatry

## 2024-02-24 NOTE — Telephone Encounter (Signed)
 Called and scheduled patient for surgery on 04/09/2024. Patient not on any blood thinner or GLP1 medications. Patients preferred pharmacy is correct in chart.Patient aware [facility] will call 24-48 hours prior to surgery with arrival time.

## 2024-03-09 ENCOUNTER — Encounter (HOSPITAL_COMMUNITY): Payer: Self-pay | Admitting: Clinical

## 2024-03-09 ENCOUNTER — Ambulatory Visit (INDEPENDENT_AMBULATORY_CARE_PROVIDER_SITE_OTHER): Payer: MEDICAID | Admitting: Clinical

## 2024-03-09 DIAGNOSIS — F4310 Post-traumatic stress disorder, unspecified: Secondary | ICD-10-CM | POA: Diagnosis not present

## 2024-03-09 DIAGNOSIS — F3160 Bipolar disorder, current episode mixed, unspecified: Secondary | ICD-10-CM

## 2024-03-09 NOTE — Progress Notes (Signed)
 THERAPIST PROGRESS NOTE  Session Time: 10:02am-10:57am  Session #25  Virtual Visit via Video Note  I connected with Bethany [name] on 03/09/24 at 10:00 AM EST by a video enabled telemedicine application and verified that I am speaking with the correct person using two identifiers.  Location: Patient: home Provider: home office   I discussed the limitations of evaluation and management by telemedicine and the availability of in person appointments. The patient expressed understanding and agreed to proceed.  I discussed the assessment and treatment plan with the patient. The patient was provided an opportunity to ask questions and all were answered. The patient agreed with the plan and demonstrated an understanding of the instructions.   The patient was advised to call back or seek an in-person evaluation if the symptoms worsen or if the condition fails to improve as anticipated.  I provided 55 minutes of non-face-to-face time during this encounter.  [name], LCSW    Participation Level: Active  Behavioral Response: Casual Alert Anxious and Euthymic   Type of Therapy: Individual Therapy  Treatment Goals addressed:  STG: Learn breathing techniques and grounding techniques at an age-appropriate level and demonstrate mastery in session then report independent use of these skills out of session.  LTG: Learn and practice communication techniques such as active listening, I statements, open-ended questions, reflective listening, assertiveness, fair fighting rules, initiating conversations, and more as necessary and taught in session.    STG: Score less than 9 on the PHQ-9 and less than 5 on the GAD-7 as evidenced by intermittent administration of the questionnaires to determine progress in managing depression and anxiety.  STG: Process life events to the extent needed so that will be able to move forward with various areas of life in a better frame of mind per self-report  of improved satisfaction with life 5 out of 7 days over the next 6 months.  STG:  Learn about 3 boundary styles and 6-8 types, how to implement them, and how to enforce them, then report feeling more empowered and content with being able to maintain more helpful, appropriate boundaries in the future for a more balanced result. STG: Become able to utilize reality-testing in order to identify paranoid and/or delusional thoughts 5 days out of 7 for the next 26 weeks.  STG:  Explore personal core beliefs, rules and assumptions, and cognitive distortions through therapist using Cognitive Behavioral Therapy; learn about replacement thoughts, Behavioral Activation and Acting As If AEB using 2 times weekly.  STG: Learn at least 2 emotion regulation strategies, 2 distress tolerance skills, 2 interpersonal effectiveness techniques, and 2 mindfulness practices; use them in session and in life situations to improve results and satisfaction.  LTG: Increase ability to tolerate being around people and endure noise that currently renders patient incapable of being around other people for long AEB by self-report that she can do at least 1-2 to 1 hour per event before leaving.  LTG: Work on physical health, increasing ability to endure walking up to 1/2 hour 3-5 times a week and gain better nutrition by learning about healthy foods and eating healthier meals 5-6 days a week AEB improved satisfaction in fitness level.  LTG:  Work to learn 10+ coping skills from models like CBT, Stages of Change, DBT, shame resilience theory, ACT, SFBT, MI, trauma-informed therapy and others to be able to manage mental health symptoms, AEB practicing out of session and reporting back.   ProgressTowards Goals: Progressing  Interventions: Conservator, Museum/gallery, Psychosocial Skills: scaling and  emotion vocabulary, Supportive, and Other: assessment   Summary: Bethany [name] is a 48 y.o. female who presents with Generalized Anxiety Disorder  with panic attacks, Bipolar Disorder with psychotic features, and Post-Traumatic Stress Disorder.  She presented oriented x5 and stated she was feeling pretty good.  CSW evaluated patient's medication compliance, use of coping tools, and self-care, as applicable.  She provided an update on various aspects of her life that are normally discussed in therapy, including the end result of asking for her money back from the birthday party that was cancelled for her uncle's funeral, her aunt's death on patient's birthday, the reasons she so dislikes the month of October, her goal for 2026, upcoming surgery on her other foot, and terminating all contact with her ex-husband.  She did end up having dinner with 12 people for her birthday at a later date, and she got full credit extended for 1 year for the party, which she gave to her daughter to utilize for her 23th birthday next year.  We explored what she learned from this situation, which included waiting to see what happens before getting so upset.  In all, her family has experienced 7 deaths in the month of October over the years so she does not see ever loving it, but she did share that she is working on her gratitude continuously.  In 2026 she has the goal of building more confidence in herself and the decisions she makes rather than having so many regrets. She shared that she has completely cut off her ex-husband after observing his behavior deteriorating over the phone and asking herself how he would have reacted if he were out of prison, saying she believes he would have laid hands on her violently.  He spoke in very derogatory terms to her and she did not really understand what he was saying until her daughter interpreted it for her and she said she is not willing to give up her dignity for him.  CSW provided positive strokes for this decision and for her valuing herself enough to decide on definite boundaries.  Although she stated she does want to eventually  have a romantic relationship, she stated she is not ready for that yet, as she is still focusing on her own mental wellbeing.  She also talked about her upcoming 2nd surgery.  CSW did the PHQ-9 and GAD-7 with patient and interpreted the results for her, comparing to when she first started in therapy.  Her PHQ-9 went from 26 to 8 and her GAD-7 went from 20 to 11.  It was also explained to her how to do scaling of her emotions and how to use the emotion wheel in combination with scaling to increase her ability to recognize what she is feeling and tells others what she is feeling.  Suicidal/Homicidal: No without intent/plan  Therapist Response:  Patient is progressing AEB engaging in scheduled therapy session.  Throughout the session, CSW gave patient the opportunity to explore thoughts and feelings associated with current life situations and past/present stressors.   CSW challenged patient gently and appropriately to consider different ways of looking at reported issues. CSW encouraged patient's expression of feelings and validated these using empathy, active listening, open body language, and unconditional positive regard.      Plan/Recommendations:  Return to therapy in 4 weeks to next scheduled appointment on 11/18, reflect on what was discussed in session, engage in self care behaviors as explored in session, and return to next session prepared to  talk about experience with new coping methods.   Diagnosis: Mixed bipolar I disorder (HCC)  Post-traumatic stress disorder, unspecified  Collaboration of Care: Psychiatrist AEB - psychiatrist can read therapy notes; therapist can and does read psychiatric notes prior to sessions  Patient/Guardian was advised Release of Information must be obtained prior to any record release in order to collaborate their care with an outside provider. Patient/Guardian was advised if they have not already done so to contact the registration department to sign all necessary  forms in order for us  to release information regarding their care.   Consent: Patient/Guardian gives verbal consent for treatment and assignment of benefits for services provided during this visit. Patient/Guardian expressed understanding and agreed to proceed.   [name], LCSW 03/09/2024     03/09/2024   10:30 AM 10/10/2023   11:37 AM 09/08/2023    9:45 AM 08/21/2023    1:34 PM 03/05/2023    8:39 AM 02/18/2023    2:30 PM 12/24/2022    1:43 PM  Depression screen PHQ 2/9  Decreased Interest 1 0 1 0 0 2 1  Down, Depressed, Hopeless 1 0 0 2 2 3 1   PHQ - 2 Score 2 0 1 2 2 5 2   Altered sleeping 2 0 0 3 3 3 3   Tired, decreased energy 1 0 0 2 2 3 3   Change in appetite 0 0 0 1 3 0 1  Feeling bad or failure about yourself  0 0 0 0 0 2 1  Trouble concentrating 2 0 2 0 3 3 3   Moving slowly or fidgety/restless 1 0 3 3 2 2 3   Suicidal thoughts 0 0 0 0 0 0 0  PHQ-9 Score 8 0  6  11  15  18  16    Difficult doing work/chores   Somewhat difficult   Extremely dIfficult Extremely dIfficult     Data saved with a previous flowsheet row definition      03/09/2024   10:32 AM 08/21/2023    1:35 PM 03/05/2023    8:41 AM 06/27/2022    8:14 AM  GAD 7 : Generalized Anxiety Score  Nervous, Anxious, on Edge 2 2 2 3   Control/stop worrying 3 3 2 3   Worry too much - different things 3 3 3 3   Trouble relaxing 2 3 1 3   Restless 2 2 2 3   Easily annoyed or irritable 2 3 3 3   Afraid - awful might happen 2 1 3 2   Total GAD 7 Score 16 17 16 20   Anxiety Difficulty Extremely difficult Very difficult Somewhat difficult Extremely difficult

## 2024-03-22 ENCOUNTER — Other Ambulatory Visit (HOSPITAL_COMMUNITY): Payer: Self-pay

## 2024-04-01 ENCOUNTER — Telehealth: Payer: Self-pay | Admitting: Podiatry

## 2024-04-01 NOTE — Telephone Encounter (Signed)
 DOS- 04/09/2024  [facility] LT- 71703 5TH [facility] REPAIR LT- 71714  TRILLIUM EFFECTIVE DATE- 10/21/2022  PER TRILLIUM PORTAL, NO PRIOR AUTHS ARE REQUIRED FOR CPT CODES 71703 AND [phone]. DOCUMENTATION ATTACHED TO SURGERY CONSENT PACKET.

## 2024-04-02 ENCOUNTER — Encounter: Payer: Self-pay | Admitting: Family Medicine

## 2024-04-06 ENCOUNTER — Ambulatory Visit (HOSPITAL_COMMUNITY): Payer: MEDICAID | Admitting: Clinical

## 2024-04-06 NOTE — Progress Notes (Unsigned)
 THERAPIST PROGRESS NOTE  Session Time: 10:02am-10:57am  Session #25  Virtual Visit via Video Note  I connected with Bethany [name] on 04/06/2024 at 10:00 AM EST by a video enabled telemedicine application and verified that I am speaking with the correct person using two identifiers.  Location: Patient: home Provider: home office   I discussed the limitations of evaluation and management by telemedicine and the availability of in person appointments. The patient expressed understanding and agreed to proceed.  I discussed the assessment and treatment plan with the patient. The patient was provided an opportunity to ask questions and all were answered. The patient agreed with the plan and demonstrated an understanding of the instructions.   The patient was advised to call back or seek an in-person evaluation if the symptoms worsen or if the condition fails to improve as anticipated.  I provided 55 minutes of non-face-to-face time during this encounter.  [name], LCSW    Participation Level: Active  Behavioral Response: Casual Alert Anxious and Euthymic   Type of Therapy: Individual Therapy  Treatment Goals addressed:  STG: Learn breathing techniques and grounding techniques at an age-appropriate level and demonstrate mastery in session then report independent use of these skills out of session.  LTG: Learn and practice communication techniques such as active listening, I statements, open-ended questions, reflective listening, assertiveness, fair fighting rules, initiating conversations, and more as necessary and taught in session.    STG: Score less than 9 on the PHQ-9 and less than 5 on the GAD-7 as evidenced by intermittent administration of the questionnaires to determine progress in managing depression and anxiety.  STG: Process life events to the extent needed so that will be able to move forward with various areas of life in a better frame of mind per  self-report of improved satisfaction with life 5 out of 7 days over the next 6 months.  STG:  Learn about 3 boundary styles and 6-8 types, how to implement them, and how to enforce them, then report feeling more empowered and content with being able to maintain more helpful, appropriate boundaries in the future for a more balanced result. STG: Become able to utilize reality-testing in order to identify paranoid and/or delusional thoughts 5 days out of 7 for the next 26 weeks.  STG:  Explore personal core beliefs, rules and assumptions, and cognitive distortions through therapist using Cognitive Behavioral Therapy; learn about replacement thoughts, Behavioral Activation and Acting As If AEB using 2 times weekly.  STG: Learn at least 2 emotion regulation strategies, 2 distress tolerance skills, 2 interpersonal effectiveness techniques, and 2 mindfulness practices; use them in session and in life situations to improve results and satisfaction.  LTG: Increase ability to tolerate being around people and endure noise that currently renders patient incapable of being around other people for long AEB by self-report that she can do at least 1-2 to 1 hour per event before leaving.  LTG: Work on physical health, increasing ability to endure walking up to 1/2 hour 3-5 times a week and gain better nutrition by learning about healthy foods and eating healthier meals 5-6 days a week AEB improved satisfaction in fitness level.  LTG:  Work to learn 10+ coping skills from models like CBT, Stages of Change, DBT, shame resilience theory, ACT, SFBT, MI, trauma-informed therapy and others to be able to manage mental health symptoms, AEB practicing out of session and reporting back.   ProgressTowards Goals: Progressing  Interventions: Conservator, Museum/gallery, Psychosocial Skills: scaling and  emotion vocabulary, Supportive, and Other: assessment   Summary: Bethany [name] is a 48 y.o. female who presents with Generalized Anxiety  Disorder with panic attacks, Bipolar Disorder with psychotic features, and Post-Traumatic Stress Disorder.  She presented oriented x5 and stated she was feeling pretty good.  CSW evaluated patient's medication compliance, use of coping tools, and self-care, as applicable.  She provided an update on various aspects of her life that are normally discussed in therapy, including the end result of asking for her money back from the birthday party that was cancelled for her uncle's funeral, her aunt's death on patient's birthday, the reasons she so dislikes the month of October, her goal for 2026, upcoming surgery on her other foot, and terminating all contact with her ex-husband.  She did end up having dinner with 12 people for her birthday at a later date, and she got full credit extended for 1 year for the party, which she gave to her daughter to utilize for her 23th birthday next year.  We explored what she learned from this situation, which included waiting to see what happens before getting so upset.  In all, her family has experienced 7 deaths in the month of October over the years so she does not see ever loving it, but she did share that she is working on her gratitude continuously.  In 2026 she has the goal of building more confidence in herself and the decisions she makes rather than having so many regrets. She shared that she has completely cut off her ex-husband after observing his behavior deteriorating over the phone and asking herself how he would have reacted if he were out of prison, saying she believes he would have laid hands on her violently.  He spoke in very derogatory terms to her and she did not really understand what he was saying until her daughter interpreted it for her and she said she is not willing to give up her dignity for him.  CSW provided positive strokes for this decision and for her valuing herself enough to decide on definite boundaries.  Although she stated she does want to  eventually have a romantic relationship, she stated she is not ready for that yet, as she is still focusing on her own mental wellbeing.  She also talked about her upcoming 2nd surgery.  CSW did the PHQ-9 and GAD-7 with patient and interpreted the results for her, comparing to when she first started in therapy.  Her PHQ-9 went from 26 to 8 and her GAD-7 went from 20 to 53.  It was also explained to her how to do scaling of her emotions and how to use the emotion wheel in combination with scaling to increase her ability to recognize what she is feeling and tells others what she is feeling.  Suicidal/Homicidal: No without intent/plan  Therapist Response:  Patient is progressing AEB engaging in scheduled therapy session.  Throughout the session, CSW gave patient the opportunity to explore thoughts and feelings associated with current life situations and past/present stressors.   CSW challenged patient gently and appropriately to consider different ways of looking at reported issues. CSW encouraged patients expression of feelings and validated these using empathy, active listening, open body language, and unconditional positive regard.      Plan/Recommendations:  Return to therapy in 4 weeks to next scheduled appointment on 11/18, reflect on what was discussed in session, engage in self care behaviors as explored in session, and return to next session prepared to  talk about experience with new coping methods.   Diagnosis: No diagnosis found.  Collaboration of Care: Psychiatrist AEB - psychiatrist can read therapy notes; therapist can and does read psychiatric notes prior to sessions  Patient/Guardian was advised Release of Information must be obtained prior to any record release in order to collaborate their care with an outside provider. Patient/Guardian was advised if they have not already done so to contact the registration department to sign all necessary forms in order for us  to release information  regarding their care.   Consent: Patient/Guardian gives verbal consent for treatment and assignment of benefits for services provided during this visit. Patient/Guardian expressed understanding and agreed to proceed.   [name], LCSW 04/06/2024     03/09/2024   10:30 AM 10/10/2023   11:37 AM 09/08/2023    9:45 AM 08/21/2023    1:34 PM 03/05/2023    8:39 AM 02/18/2023    2:30 PM 12/24/2022    1:43 PM  Depression screen PHQ 2/9  Decreased Interest 1 0 1 0 0 2 1  Down, Depressed, Hopeless 1 0 0 2 2 3 1   PHQ - 2 Score 2 0 1 2 2 5 2   Altered sleeping 2 0 0 3 3 3 3   Tired, decreased energy 1 0 0 2 2 3 3   Change in appetite 0 0 0 1 3 0 1  Feeling bad or failure about yourself  0 0 0 0 0 2 1  Trouble concentrating 2 0 2 0 3 3 3   Moving slowly or fidgety/restless 1 0 3 3 2 2 3   Suicidal thoughts 0 0 0 0 0 0 0  PHQ-9 Score 8 0  6  11  15  18  16    Difficult doing work/chores   Somewhat difficult   Extremely dIfficult Extremely dIfficult     Data saved with a previous flowsheet row definition      03/09/2024   10:32 AM 08/21/2023    1:35 PM 03/05/2023    8:41 AM 06/27/2022    8:14 AM  GAD 7 : Generalized Anxiety Score  Nervous, Anxious, on Edge 2 2 2 3   Control/stop worrying 3 3 2 3   Worry too much - different things 3 3 3 3   Trouble relaxing 2 3 1 3   Restless 2 2 2 3   Easily annoyed or irritable 2 3 3 3   Afraid - awful might happen 2 1 3 2   Total GAD 7 Score 16 17 16 20   Anxiety Difficulty Extremely difficult Very difficult Somewhat difficult Extremely difficult

## 2024-04-07 ENCOUNTER — Encounter (HOSPITAL_COMMUNITY): Payer: Self-pay | Admitting: Clinical

## 2024-04-07 ENCOUNTER — Other Ambulatory Visit: Payer: Self-pay | Admitting: Podiatry

## 2024-04-07 MED ORDER — DOXYCYCLINE HYCLATE 100 MG PO TABS: 100.0000 mg | ORAL_TABLET | Freq: Two times a day (BID) | ORAL | 0 refills | Status: DC

## 2024-04-07 MED ORDER — HYDROCODONE-ACETAMINOPHEN 5-325 MG PO TABS: 2.0000 | ORAL_TABLET | Freq: Four times a day (QID) | ORAL | 0 refills | Status: DC | PRN

## 2024-04-07 MED ORDER — CLINDAMYCIN HCL 150 MG PO CAPS: 150.0000 mg | ORAL_CAPSULE | Freq: Three times a day (TID) | ORAL | 0 refills | Status: DC

## 2024-04-07 MED ORDER — ONDANSETRON HCL 4 MG PO TABS: 4.0000 mg | ORAL_TABLET | Freq: Three times a day (TID) | ORAL | 0 refills | Status: DC | PRN

## 2024-04-07 MED ORDER — ONDANSETRON HCL 4 MG PO TABS: 4.0000 mg | ORAL_TABLET | Freq: Three times a day (TID) | ORAL | 0 refills | Status: AC | PRN

## 2024-04-07 MED ORDER — HYDROCODONE-ACETAMINOPHEN 5-325 MG PO TABS: 2.0000 | ORAL_TABLET | Freq: Four times a day (QID) | ORAL | 0 refills | Status: AC | PRN

## 2024-04-08 ENCOUNTER — Telehealth: Payer: Self-pay | Admitting: Podiatry

## 2024-04-08 NOTE — Telephone Encounter (Signed)
 Received call from Montie- unable to proceed with surgery tomorrow 12/19 due to cost of implants. Per Dr.Hyatt Dr.McDonald to perform the procedure. Patient has been scheduled for surgery and to come and sign consents with Dr. Silva.

## 2024-04-09 ENCOUNTER — Ambulatory Visit: Payer: MEDICAID | Admitting: Family Medicine

## 2024-04-14 ENCOUNTER — Encounter: Payer: MEDICAID | Admitting: Podiatry

## 2024-04-14 ENCOUNTER — Other Ambulatory Visit (HOSPITAL_COMMUNITY): Payer: Self-pay | Admitting: Psychiatry

## 2024-04-14 DIAGNOSIS — F3160 Bipolar disorder, current episode mixed, unspecified: Secondary | ICD-10-CM

## 2024-04-16 ENCOUNTER — Emergency Department (HOSPITAL_COMMUNITY)
Admission: EM | Admit: 2024-04-16 | Discharge: 2024-04-16 | Disposition: A | Discharge status: Home / Self Care | Payer: MEDICAID | Attending: Emergency Medicine | Admitting: Emergency Medicine

## 2024-04-16 ENCOUNTER — Other Ambulatory Visit: Payer: Self-pay

## 2024-04-16 ENCOUNTER — Emergency Department (HOSPITAL_COMMUNITY): Payer: MEDICAID

## 2024-04-16 ENCOUNTER — Encounter (HOSPITAL_COMMUNITY): Payer: Self-pay

## 2024-04-16 DIAGNOSIS — K921 Melena: Secondary | ICD-10-CM

## 2024-04-16 DIAGNOSIS — R195 Other fecal abnormalities: Secondary | ICD-10-CM | POA: Insufficient documentation

## 2024-04-16 DIAGNOSIS — R1010 Upper abdominal pain, unspecified: Secondary | ICD-10-CM | POA: Diagnosis present

## 2024-04-16 DIAGNOSIS — K529 Noninfective gastroenteritis and colitis, unspecified: Secondary | ICD-10-CM | POA: Insufficient documentation

## 2024-04-16 LAB — COMPREHENSIVE METABOLIC PANEL WITH GFR
ALT: 11 U/L (ref 0–44)
AST: 19 U/L (ref 15–41)
Albumin: 4.6 g/dL (ref 3.5–5.0)
Alkaline Phosphatase: 59 U/L (ref 38–126)
Anion gap: 13 (ref 5–15)
BUN: 7 mg/dL (ref 6–20)
CO2: 22 mmol/L (ref 22–32)
Calcium: 9.3 mg/dL (ref 8.9–10.3)
Chloride: 107 mmol/L (ref 98–111)
Creatinine, Ser: 0.84 mg/dL (ref 0.44–1.00)
GFR, Estimated: >60 mL/min
Glucose, Bld: 95 mg/dL (ref 70–99)
Potassium: 3.6 mmol/L (ref 3.5–5.1)
Sodium: 141 mmol/L (ref 135–145)
Total Bilirubin: 0.3 mg/dL (ref 0.0–1.2)
Total Protein: 7.6 g/dL (ref 6.5–8.1)

## 2024-04-16 LAB — CBC
HCT: 40.5 % (ref 36.0–46.0)
Hemoglobin: 12.8 g/dL (ref 12.0–15.0)
MCH: 27.4 pg (ref 26.0–34.0)
MCHC: 31.6 g/dL (ref 30.0–36.0)
MCV: 86.5 fL (ref 80.0–100.0)
Platelets: 263 K/uL (ref 150–400)
RBC: 4.68 MIL/uL (ref 3.87–5.11)
RDW: 14.8 % (ref 11.5–15.5)
WBC: 5.1 K/uL (ref 4.0–10.5)
nRBC: 0 % (ref 0.0–0.2)

## 2024-04-16 LAB — TYPE AND SCREEN
ABO/RH(D): O POS
Antibody Screen: NEGATIVE

## 2024-04-16 LAB — LIPASE, BLOOD: Lipase: 15 U/L (ref 11–51)

## 2024-04-16 MED ORDER — IOHEXOL 300 MG/ML  SOLN
100.0000 mL | Freq: Once | INTRAMUSCULAR | Status: AC | PRN
  Administered 2024-04-16: 100 mL via INTRAVENOUS

## 2024-04-16 MED ORDER — DICYCLOMINE HCL 20 MG PO TABS: 20.0000 mg | ORAL_TABLET | Freq: Two times a day (BID) | ORAL | 0 refills | Status: DC

## 2024-04-16 MED ORDER — DICYCLOMINE HCL 10 MG PO CAPS
10.0000 mg | ORAL_CAPSULE | Freq: Once | ORAL | Status: AC
  Administered 2024-04-16: 10 mg via ORAL
  Filled 2024-04-16: qty 1

## 2024-04-16 NOTE — ED Notes (Signed)
 Lab said if you want blood on this patient, they will need a second blood type.

## 2024-04-16 NOTE — Discharge Instructions (Addendum)
 You have colitis or some inflammation of your colon.  This is most often caused by a virus and typically resolves on its own after a few days.  You can take Bentyl  for pain.  Follow-up with the gastroenterologist in your discharge paperwork.  Return to the emergency room if you develop worsening pain in your abdomen, inability eat or drink, feel lightheaded or lose consciousness.

## 2024-04-16 NOTE — ED Triage Notes (Signed)
 Abdominal pain, 2 episodes of bloody stools, pt reports stool was dark red. No blood thinners. Denies N/V/D. C/o fatigue

## 2024-04-16 NOTE — ED Provider Notes (Signed)
" °  Physical Exam  BP 119/85   Pulse 65   Temp 98.2 F (36.8 C) (Oral)   Resp 16   Ht 5' 4 (1.626 m)   Wt 67 kg   LMP 12/18/2015   SpO2 99%   BMI 25.35 kg/m   Physical Exam  Procedures  Procedures  ED Course / MDM   Clinical Course as of 04/16/24 1837  Fri Apr 16, 2024  1428 CBC nl [JK]  1428 Comprehensive metabolic panel nl [JK]  1429 Lipase, blood nl [JK]    Clinical Course User Index [JK] Randol Simmonds, MD   Medical Decision Making Amount and/or Complexity of Data Reviewed Labs: ordered. Decision-making details documented in ED Course. Radiology: ordered.  Risk Prescription drug management.   Bethany [name], assumed care for this patient.  In brief, 48 year old female had a few episodes of bloody stools.  Hemoglobin stable.  CT imaging negative.  Colitis on CT imaging, likely cause of bloody stools.  Will discharge with Bentyl .  Presumed viral.       [name] Fairy T, DO 04/16/24 1840  "

## 2024-04-16 NOTE — ED Notes (Signed)
 Unsuccessful IV attempt x2.

## 2024-04-16 NOTE — ED Provider Notes (Signed)
 " [place] EMERGENCY DEPARTMENT AT [facility] Provider Note   CSN: [id number] Arrival date & time: 04/16/24  1200     Patient presents with: Abdominal Pain and Blood In Stools   Bethany [name] is a 48 y.o. female.    Abdominal Pain    Patient has history of anxiety depression, acid reflux, bipolar disorder, hypokalemic periodic paralysis nausea.  Patient presents to the ED with complaints of abdominal pain .  Patient states she has been having some pain in her abdomen mostly in the upper abdomen.  The pain is sharp in nature.  Patient states she noticed some blood in her stool when she had a bowel movement this morning.  She has not had any recurrent episodes of blood in her stool.  She denies any fevers.  No vomiting.  Prior to Admission medications  Medication Sig Start Date End Date Taking? Authorizing Provider  dicyclomine  (BENTYL ) 20 MG tablet Take 1 tablet (20 mg total) by mouth 2 (two) times daily. 04/16/24  Yes Mannie Pac T, DO  doxycycline  (VIBRA -TABS) 100 MG tablet Take 1 tablet (100 mg total) by mouth 2 (two) times daily. 04/07/24   Hyatt, Max T, DPM  ondansetron  (ZOFRAN ) 4 MG tablet Take 1 tablet (4 mg total) by mouth every 8 (eight) hours as needed. 04/07/24   Hyatt, Max T, DPM  bisacodyl  (DULCOLAX) 5 MG EC tablet Take 5 mg by mouth daily.    [provider]  carboxymethylcellulose (REFRESH PLUS) 0.5 % SOLN Place 1 drop into both eyes 2 (two) times daily as needed (dry eyes).    [provider]  linaclotide  (LINZESS ) 145 MCG CAPS capsule TAKE 1 CAPSULE BY MOUTH EVERY DAY BEFORE BREAKFAST - INSURANCE WILL PAY ON 6/25 04/30/23   Tharon Lung, MD  lithium  carbonate (ESKALITH ) 450 MG ER tablet Take 1 tablet (450 mg total) by mouth 3 (three) times daily. 01/27/24 04/26/24  Arfeen, Leni DASEN, MD  Melatonin 10 MG CAPS Take 10 mg by mouth at bedtime as needed.    [provider]  ondansetron  (ZOFRAN ) 4 MG tablet Take 1 tablet (4 mg total) by  mouth every 8 (eight) hours as needed for nausea or vomiting. 10/11/22   Scheeler, Donnice PARAS, PA-C  ondansetron  (ZOFRAN -ODT) 4 MG disintegrating tablet Take 1 tablet (4 mg total) by mouth every 8 (eight) hours as needed for nausea or vomiting. 10/30/23   Landy Honora CROME, PA-C  pantoprazole  (PROTONIX ) 40 MG tablet TAKE 1 TABLET BY MOUTH EVERY DAY 02/04/24   Tharon Lung, MD  promethazine  (PHENERGAN ) 12.5 MG tablet Take 1 tablet (12.5 mg total) by mouth every 6 (six) hours as needed for nausea or vomiting. 12/30/22   Tharon Lung, MD  QUEtiapine  (SEROQUEL ) 25 MG tablet Take 1 tablet (25 mg total) by mouth 2 (two) times daily. 01/27/24 04/26/24  Arfeen, Leni DASEN, MD  QUEtiapine  (SEROQUEL ) 300 MG tablet Take 1 tablet (300 mg total) by mouth at bedtime. 01/27/24 04/26/24  Arfeen, Leni DASEN, MD  sertraline  (ZOLOFT ) 25 MG tablet Take 3 tablets (75 mg total) by mouth daily. 01/27/24   Arfeen, Leni DASEN, MD    Allergies: Penicillins, Tapentadol, Lamictal  [lamotrigine ], Codeine, and Oxycodone  hcl    Review of Systems  Gastrointestinal:  Positive for abdominal pain.    Updated Vital Signs BP 116/80   Pulse 70   Temp 98.1 F (36.7 C)   Resp 18   Ht 1.626 m (5' 4)   Wt 67 kg  LMP 12/18/2015   SpO2 100%   BMI 25.35 kg/m   Physical Exam Vitals and nursing note reviewed.  Constitutional:      General: She is not in acute distress.    Appearance: She is well-developed.  HENT:     Head: Normocephalic and atraumatic.     Right Ear: External ear normal.     Left Ear: External ear normal.  Eyes:     General: No scleral icterus.       Right eye: No discharge.        Left eye: No discharge.     Conjunctiva/sclera: Conjunctivae normal.  Neck:     Trachea: No tracheal deviation.  Cardiovascular:     Rate and Rhythm: Normal rate and regular rhythm.  Pulmonary:     Effort: Pulmonary effort is normal. No respiratory distress.     Breath sounds: Normal breath sounds. No stridor. No wheezing or rales.  Abdominal:      General: Bowel sounds are normal. There is no distension.     Palpations: Abdomen is soft.     Tenderness: There is abdominal tenderness in the epigastric area. There is no guarding or rebound.  Musculoskeletal:        General: No tenderness or deformity.     Cervical back: Neck supple.  Skin:    General: Skin is warm and dry.     Findings: No rash.  Neurological:     General: No focal deficit present.     Mental Status: She is alert.     Cranial Nerves: No cranial nerve deficit, dysarthria or facial asymmetry.     Sensory: No sensory deficit.     Motor: No abnormal muscle tone or seizure activity.     Coordination: Coordination normal.  Psychiatric:        Mood and Affect: Mood normal.     (all labs ordered are listed, but only abnormal results are displayed) Labs Reviewed  LIPASE, BLOOD  COMPREHENSIVE METABOLIC PANEL WITH GFR  CBC  TYPE AND SCREEN  ABO/RH    EKG: None  Radiology: CT ABDOMEN PELVIS W CONTRAST Result Date: 04/16/2024 EXAM: CT ABDOMEN AND PELVIS WITH CONTRAST 04/16/2024 04:43:25 PM TECHNIQUE: CT of the abdomen and pelvis was performed with the administration of 100 mL of iohexol  (OMNIPAQUE ) 300 MG/ML solution. Multiplanar reformatted images are provided for review. Automated exposure control, iterative reconstruction, and/or weight-based adjustment of the mA/kV was utilized to reduce the radiation dose to as low as reasonably achievable. COMPARISON: 12/22/2014 CLINICAL HISTORY: Diverticulitis, complication suspected; Abdominal pain bleeding. FINDINGS: LOWER CHEST: Minimal posterior bibasilar atelectasis. Mastectomy changes. LIVER: Diffuse hepatic steatosis. GALLBLADDER AND BILE DUCTS: Gallbladder is unremarkable. No biliary ductal dilatation. SPLEEN: No acute abnormality. PANCREAS: No acute abnormality. ADRENAL GLANDS: No acute abnormality. KIDNEYS, URETERS AND BLADDER: No stones in the kidneys or ureters. No hydronephrosis. No perinephric or periureteral  stranding. Urinary bladder is unremarkable. GI AND BOWEL: Stomach demonstrates no acute abnormality. Diffusely mucosal enhancement with scattered wall thickening throughout the colon. There is no bowel obstruction. PERITONEUM AND RETROPERITONEUM: Small volume free fluid in the rectouterine pouch, which may be physiologic in a female of this age. No free air. VASCULATURE: Aorta is normal in caliber. LYMPH NODES: No lymphadenopathy. REPRODUCTIVE ORGANS: No acute abnormality. BONES AND SOFT TISSUES: Multilevel thoracolumbar osteophytosis. No acute osseous abnormality. No focal soft tissue abnormality. IMPRESSION: 1. Diffuse mucosal enhancement and scattered colonic wall thickening. While this may be due to underdistention, it's worrisome for an infectious or  inflammatory colitis. 2. Diffuse hepatic steatosis. Electronically signed by: Rogelia Myers MD 04/16/2024 05:44 PM EST RP Workstation: [id number]     Procedures   Medications Ordered in the ED  iohexol  (OMNIPAQUE ) 300 MG/ML solution 100 mL (100 mLs Intravenous Contrast Given 04/16/24 1635)  dicyclomine  (BENTYL ) capsule 10 mg (10 mg Oral Given 04/16/24 1901)    Clinical Course as of 04/17/24 0907  Fri Apr 16, 2024  1428 CBC nl [JK]  1428 Comprehensive metabolic panel nl [JK]  1429 Lipase, blood nl [JK]    Clinical Course User Index [JK] Randol Simmonds, MD                                 Medical Decision Making Problems Addressed: Blood in stool: acute illness or injury  Amount and/or Complexity of Data Reviewed Labs: ordered. Decision-making details documented in ED Course. Radiology: ordered.  Risk Prescription drug management.   Patient presented with complaints of abdominal pain some blood in the stool.  Initial labs not showing sliding signs of anemia or significant blood loss.  Metabolic panel unremarkable.  With her complaints of abdominal pain, diverticulitis colitis concern.  CT scan ordered.  Care turned over to Dr. Mannie  at shift change pending ct results     Final diagnoses:  Blood in stool  Colitis    ED Discharge Orders          Ordered    dicyclomine  (BENTYL ) 20 MG tablet  2 times daily        04/16/24 1840               Randol Simmonds, MD 04/17/24 [phone]  "

## 2024-04-17 ENCOUNTER — Ambulatory Visit: Payer: MEDICAID

## 2024-04-19 ENCOUNTER — Other Ambulatory Visit (HOSPITAL_COMMUNITY): Payer: Self-pay | Admitting: Psychiatry

## 2024-04-19 ENCOUNTER — Encounter: Payer: Self-pay | Admitting: Family Medicine

## 2024-04-19 DIAGNOSIS — F4310 Post-traumatic stress disorder, unspecified: Secondary | ICD-10-CM

## 2024-04-19 DIAGNOSIS — F3160 Bipolar disorder, current episode mixed, unspecified: Secondary | ICD-10-CM

## 2024-04-19 DIAGNOSIS — G4700 Insomnia, unspecified: Secondary | ICD-10-CM

## 2024-04-21 ENCOUNTER — Other Ambulatory Visit: Payer: Self-pay

## 2024-04-21 ENCOUNTER — Emergency Department (HOSPITAL_COMMUNITY)
Admission: EM | Admit: 2024-04-21 | Discharge: 2024-04-22 | Disposition: A | Discharge status: Home / Self Care | Payer: MEDICAID | Attending: Emergency Medicine | Admitting: Emergency Medicine

## 2024-04-21 ENCOUNTER — Encounter (HOSPITAL_COMMUNITY): Payer: Self-pay | Admitting: Emergency Medicine

## 2024-04-21 DIAGNOSIS — K529 Noninfective gastroenteritis and colitis, unspecified: Secondary | ICD-10-CM | POA: Diagnosis not present

## 2024-04-21 DIAGNOSIS — K625 Hemorrhage of anus and rectum: Secondary | ICD-10-CM | POA: Diagnosis present

## 2024-04-21 LAB — CBC WITH DIFFERENTIAL/PLATELET
Abs Immature Granulocytes: 0.01 K/uL (ref 0.00–0.07)
Basophils Absolute: 0 K/uL (ref 0.0–0.1)
Basophils Relative: 0 %
Eosinophils Absolute: 0.1 K/uL (ref 0.0–0.5)
Eosinophils Relative: 1 %
HCT: 39.4 % (ref 36.0–46.0)
Hemoglobin: 12.5 g/dL (ref 12.0–15.0)
Immature Granulocytes: 0 %
Lymphocytes Relative: 33 %
Lymphs Abs: 2.5 K/uL (ref 0.7–4.0)
MCH: 27.5 pg (ref 26.0–34.0)
MCHC: 31.7 g/dL (ref 30.0–36.0)
MCV: 86.8 fL (ref 80.0–100.0)
Monocytes Absolute: 0.4 K/uL (ref 0.1–1.0)
Monocytes Relative: 5 %
Neutro Abs: 4.6 K/uL (ref 1.7–7.7)
Neutrophils Relative %: 61 %
Platelets: 257 K/uL (ref 150–400)
RBC: 4.54 MIL/uL (ref 3.87–5.11)
RDW: 14.9 % (ref 11.5–15.5)
Smear Review: NORMAL
WBC: 7.6 K/uL (ref 4.0–10.5)
nRBC: 0 % (ref 0.0–0.2)

## 2024-04-21 LAB — URINALYSIS, ROUTINE W REFLEX MICROSCOPIC
Bilirubin Urine: NEGATIVE
Glucose, UA: NEGATIVE mg/dL
Hgb urine dipstick: NEGATIVE
Ketones, ur: NEGATIVE mg/dL
Leukocytes,Ua: NEGATIVE
Nitrite: NEGATIVE
Protein, ur: NEGATIVE mg/dL
Specific Gravity, Urine: 1.025 (ref 1.005–1.030)
pH: 5 (ref 5.0–8.0)

## 2024-04-21 LAB — COMPREHENSIVE METABOLIC PANEL WITH GFR
ALT: 16 U/L (ref 0–44)
AST: 19 U/L (ref 15–41)
Albumin: 4.6 g/dL (ref 3.5–5.0)
Alkaline Phosphatase: 64 U/L (ref 38–126)
Anion gap: 10 (ref 5–15)
BUN: <5 mg/dL — ABNORMAL LOW (ref 6–20)
CO2: 25 mmol/L (ref 22–32)
Calcium: 9.3 mg/dL (ref 8.9–10.3)
Chloride: 107 mmol/L (ref 98–111)
Creatinine, Ser: 0.83 mg/dL (ref 0.44–1.00)
GFR, Estimated: >60 mL/min
Glucose, Bld: 96 mg/dL (ref 70–99)
Potassium: 3.1 mmol/L — ABNORMAL LOW (ref 3.5–5.1)
Sodium: 141 mmol/L (ref 135–145)
Total Bilirubin: <0.2 mg/dL (ref 0.0–1.2)
Total Protein: 7.6 g/dL (ref 6.5–8.1)

## 2024-04-21 LAB — LIPASE, BLOOD: Lipase: 15 U/L (ref 11–51)

## 2024-04-21 LAB — PROTIME-INR
INR: 0.9 (ref 0.8–1.2)
Prothrombin Time: 13 s (ref 11.4–15.2)

## 2024-04-21 LAB — TYPE AND SCREEN
ABO/RH(D): O POS
Antibody Screen: NEGATIVE

## 2024-04-21 MED ORDER — PANTOPRAZOLE SODIUM 40 MG IV SOLR: 40.0000 mg | Freq: Once | INTRAVENOUS | Status: DC

## 2024-04-21 NOTE — ED Provider Triage Note (Signed)
 Emergency Medicine Provider Triage Evaluation Note  JUNICE FEI , a 48 y.o. female  was evaluated in triage.  Pt complains of rectal bleeding, generalized abdominal discomfort which has been ongoing for approximate the past week.  Patient was recently seen in the emergency department and diagnosed with colitis.  She notes that she has been compliant with all medications.  She notes that the bleeding has worsened today with associated fatigue.  Blood is bright red..  Review of Systems  Positive: Abdominal pain, hematochezia, malaise and fatigue Negative: Vomiting, chest pain, shortness of breath  Physical Exam  BP 122/81 (BP Location: Right Arm)   Pulse 84   Temp 98.1 F (36.7 C) (Oral)   Resp 16   LMP 12/18/2015   SpO2 99%  Gen:   Awake, no distress   Resp:  Normal effort  MSK:   Moves extremities without difficulty  Other:    Medical Decision Making  Medically screening exam initiated at 6:52 PM.  Appropriate orders placed.  HOLDEN DRAUGHON was informed that the remainder of the evaluation will be completed by another provider, this initial triage assessment does not replace that evaluation, and the importance of remaining in the ED until their evaluation is complete.  Labs ordered.  Awaiting bed in the back at this time.  No signs of acute distress.   Daralene Lonni BIRCH, PA-C 04/21/24 [phone]

## 2024-04-21 NOTE — ED Triage Notes (Signed)
 Pt presents with rectal bleeding. Seen Monday for same.  Pt reports increasing bloody stools and feeling weaker.

## 2024-04-22 ENCOUNTER — Encounter (HOSPITAL_COMMUNITY): Payer: Self-pay

## 2024-04-22 MED ORDER — METRONIDAZOLE 500 MG/100ML IV SOLN: 500.0000 mg | Freq: Once | INTRAVENOUS | Status: DC

## 2024-04-22 MED ORDER — CIPROFLOXACIN HCL 500 MG PO TABS: 500.0000 mg | ORAL_TABLET | Freq: Two times a day (BID) | ORAL | 0 refills | Status: DC

## 2024-04-22 MED ORDER — METRONIDAZOLE 500 MG PO TABS
500.0000 mg | ORAL_TABLET | Freq: Once | ORAL | Status: AC
  Administered 2024-04-22: 500 mg via ORAL
  Filled 2024-04-22: qty 1

## 2024-04-22 MED ORDER — CIPROFLOXACIN HCL 500 MG PO TABS
500.0000 mg | ORAL_TABLET | Freq: Once | ORAL | Status: AC
  Administered 2024-04-22: 500 mg via ORAL
  Filled 2024-04-22: qty 1

## 2024-04-22 MED ORDER — METRONIDAZOLE 500 MG PO TABS: 500.0000 mg | ORAL_TABLET | Freq: Two times a day (BID) | ORAL | 0 refills | Status: DC

## 2024-04-22 NOTE — Discharge Instructions (Addendum)
 Please follow-up with your gastroenterologist as scheduled on Monday.  Please return for worsening abdominal pain fever inability eat or drink.  Your potassium level is a little bit low.  Try to eat a few the time potassium with each meal for the next week and have your doctor recheck it.

## 2024-04-22 NOTE — ED Provider Notes (Signed)
 " [place] EMERGENCY DEPARTMENT AT [facility] Provider Note   CSN: [id number] Arrival date & time: 04/21/24  1831     Patient presents with: Rectal Bleeding   Bethany [name] is a 49 y.o. female.   49 yo F with a chief complaints of bright red blood per rectum.  This been going on for about a week now.  She has been seen in the ED for this previously.  Had CT imaging.  Started on Bentyl  but has continued symptoms.  She thinks that she has had a couple bowel movements that were bloody yesterday.  Has been feeling lightheaded and fatigued.  Came back in for reevaluation.   Rectal Bleeding      Prior to Admission medications  Medication Sig Start Date End Date Taking? Authorizing Provider  doxycycline  (VIBRA -TABS) 100 MG tablet Take 1 tablet (100 mg total) by mouth 2 (two) times daily. 04/07/24   Hyatt, Max T, DPM  metroNIDAZOLE  (FLAGYL ) 500 MG tablet Take 1 tablet (500 mg total) by mouth 2 (two) times daily. 04/22/24  Yes Emil Share, DO  ondansetron  (ZOFRAN ) 4 MG tablet Take 1 tablet (4 mg total) by mouth every 8 (eight) hours as needed. 04/07/24   Hyatt, Max T, DPM  bisacodyl  (DULCOLAX) 5 MG EC tablet Take 5 mg by mouth daily.    [provider]  carboxymethylcellulose (REFRESH PLUS) 0.5 % SOLN Place 1 drop into both eyes 2 (two) times daily as needed (dry eyes).    [provider]  ciprofloxacin  (CIPRO ) 500 MG tablet Take 1 tablet (500 mg total) by mouth 2 (two) times daily. One po bid x 7 days 04/22/24   Emil Share, DO  dicyclomine  (BENTYL ) 20 MG tablet Take 1 tablet (20 mg total) by mouth 2 (two) times daily. 04/16/24   Mannie Pac T, DO  linaclotide  (LINZESS ) 145 MCG CAPS capsule TAKE 1 CAPSULE BY MOUTH EVERY DAY BEFORE BREAKFAST - INSURANCE WILL PAY ON 6/25 04/30/23   Tharon Lung, MD  lithium  carbonate (ESKALITH ) 450 MG ER tablet Take 1 tablet (450 mg total) by mouth 3 (three) times daily. 01/27/24 04/26/24  Arfeen, Leni DASEN, MD  Melatonin 10 MG CAPS Take  10 mg by mouth at bedtime as needed.    [provider]  ondansetron  (ZOFRAN ) 4 MG tablet Take 1 tablet (4 mg total) by mouth every 8 (eight) hours as needed for nausea or vomiting. 10/11/22   Scheeler, Donnice PARAS, PA-C  ondansetron  (ZOFRAN -ODT) 4 MG disintegrating tablet Take 1 tablet (4 mg total) by mouth every 8 (eight) hours as needed for nausea or vomiting. 10/30/23   Landy Honora CROME, PA-C  pantoprazole  (PROTONIX ) 40 MG tablet TAKE 1 TABLET BY MOUTH EVERY DAY 02/04/24   Tharon Lung, MD  promethazine  (PHENERGAN ) 12.5 MG tablet Take 1 tablet (12.5 mg total) by mouth every 6 (six) hours as needed for nausea or vomiting. 12/30/22   Tharon Lung, MD  QUEtiapine  (SEROQUEL ) 25 MG tablet Take 1 tablet (25 mg total) by mouth 2 (two) times daily. 01/27/24 04/26/24  Arfeen, Leni DASEN, MD  QUEtiapine  (SEROQUEL ) 300 MG tablet Take 1 tablet (300 mg total) by mouth at bedtime. 01/27/24 04/26/24  Arfeen, Leni DASEN, MD  sertraline  (ZOLOFT ) 25 MG tablet Take 3 tablets (75 mg total) by mouth daily. 01/27/24   Arfeen, Leni DASEN, MD    Allergies: Penicillins, Tapentadol, Lamictal  [lamotrigine ], Codeine, and Oxycodone  hcl    Review of Systems  Gastrointestinal:  Positive for hematochezia.  Updated Vital Signs BP 109/75 (BP Location: Right Arm)   Pulse 67   Temp 98.5 F (36.9 C) (Oral)   Resp 17   LMP 12/18/2015   SpO2 100%   Physical Exam Vitals and nursing note reviewed.  Constitutional:      General: She is not in acute distress.    Appearance: She is well-developed. She is not diaphoretic.  HENT:     Head: Normocephalic and atraumatic.  Eyes:     Pupils: Pupils are equal, round, and reactive to light.  Cardiovascular:     Rate and Rhythm: Normal rate and regular rhythm.     Heart sounds: No murmur heard.    No friction rub. No gallop.  Pulmonary:     Effort: Pulmonary effort is normal.     Breath sounds: No wheezing or rales.  Abdominal:     General: There is no distension.     Palpations:  Abdomen is soft.     Tenderness: There is no abdominal tenderness.  Musculoskeletal:        General: No tenderness.     Cervical back: Normal range of motion and neck supple.  Skin:    General: Skin is warm and dry.  Neurological:     Mental Status: She is alert and oriented to person, place, and time.  Psychiatric:        Behavior: Behavior normal.     (all labs ordered are listed, but only abnormal results are displayed) Labs Reviewed  COMPREHENSIVE METABOLIC PANEL WITH GFR - Abnormal; Notable for the following components:      Result Value   Potassium 3.1 (*)    BUN <5 (*)    All other components within normal limits  URINALYSIS, ROUTINE W REFLEX MICROSCOPIC - Abnormal; Notable for the following components:   Color, Urine AMBER (*)    APPearance HAZY (*)    All other components within normal limits  LIPASE, BLOOD  CBC WITH DIFFERENTIAL/PLATELET  PROTIME-INR  TYPE AND SCREEN    EKG: None  Radiology: No results found.   Procedures   Medications Ordered in the ED  pantoprazole  (PROTONIX ) injection 40 mg (has no administration in time range)  ciprofloxacin  (CIPRO ) tablet 500 mg (has no administration in time range)  metroNIDAZOLE  (FLAGYL ) IVPB 500 mg (has no administration in time range)                                    Medical Decision Making Risk Prescription drug management.   50 yo F with a chief complaints of bright red blood per rectum.  Going on for about a week.  Hemoglobin here is stable.  Patient's potassium is 3.1.  Will have her increase her dietary potassium at home. I discussed results with the patient and family.  On my record review the patient was diagnosed with colitis.  She was not started on antibiotics.  I will start her on antibiotics at this time.  She tells me she has scheduled follow-up with GI in less than a week.  Encouraged her to keep that appointment.  2:50 AM:  I have discussed the diagnosis/risks/treatment options with the  patient and family.  Evaluation and diagnostic testing in the emergency department does not suggest an emergent condition requiring admission or immediate intervention beyond what has been performed at this time.  They will follow up with PCP, GI. We also discussed returning to the  ED immediately if new or worsening sx occur. We discussed the sx which are most concerning (e.g., sudden worsening pain, fever, inability to tolerate by mouth) that necessitate immediate return. Medications administered to the patient during their visit and any new prescriptions provided to the patient are listed below.  Medications given during this visit Medications  pantoprazole  (PROTONIX ) injection 40 mg (has no administration in time range)  ciprofloxacin  (CIPRO ) tablet 500 mg (has no administration in time range)  metroNIDAZOLE  (FLAGYL ) IVPB 500 mg (has no administration in time range)     The patient appears reasonably screen and/or stabilized for discharge and I doubt any other medical condition or other [facility] requiring further screening, evaluation, or treatment in the ED at this time prior to discharge.       Final diagnoses:  Colitis    ED Discharge Orders          Ordered    ciprofloxacin  (CIPRO ) 500 MG tablet  2 times daily,   Status:  Discontinued        04/22/24 0246    metroNIDAZOLE  (FLAGYL ) 500 MG tablet  2 times daily        04/22/24 0246    ciprofloxacin  (CIPRO ) 500 MG tablet  2 times daily        04/22/24 0247               Emil Share, DO 04/22/24 0251  "

## 2024-04-26 ENCOUNTER — Telehealth (HOSPITAL_COMMUNITY): Payer: Self-pay

## 2024-04-26 ENCOUNTER — Other Ambulatory Visit (HOSPITAL_COMMUNITY): Payer: Self-pay | Admitting: *Deleted

## 2024-04-26 ENCOUNTER — Ambulatory Visit (HOSPITAL_COMMUNITY): Payer: MEDICAID

## 2024-04-26 DIAGNOSIS — Z5181 Encounter for therapeutic drug level monitoring: Secondary | ICD-10-CM

## 2024-04-26 DIAGNOSIS — F3160 Bipolar disorder, current episode mixed, unspecified: Secondary | ICD-10-CM

## 2024-04-26 DIAGNOSIS — F319 Bipolar disorder, unspecified: Secondary | ICD-10-CM

## 2024-04-26 NOTE — Progress Notes (Signed)
 Patient came in for a Lithium  level. Patient was at the ED over the holiday and was told to come get her level checked asap. Patient presents well groomed with an appropriate affect. Labs were drawn from the right [facility], using a 22 G straight needle. I collected one tiger top. Patient tolerated well and without complaint

## 2024-04-26 NOTE — Telephone Encounter (Signed)
 Good morning. This patient was in the ED and was advised to come and get a Lithium  level. I had patient do a walk in this morning, I will put in the order. Just an FYI

## 2024-04-28 ENCOUNTER — Telehealth (HOSPITAL_COMMUNITY): Payer: Self-pay | Admitting: *Deleted

## 2024-04-28 LAB — LITHIUM LEVEL: Lithium Lvl: <0.1 mmol/L — ABNORMAL LOW (ref 0.5–1.2)

## 2024-04-28 NOTE — Telephone Encounter (Signed)
 Pt Lithium  Level  drawn on 04/26/24 and resulted today was <0.1 mmol/L

## 2024-04-29 ENCOUNTER — Other Ambulatory Visit (HOSPITAL_COMMUNITY): Payer: Self-pay | Admitting: *Deleted

## 2024-04-29 ENCOUNTER — Encounter (HOSPITAL_COMMUNITY): Payer: Self-pay | Admitting: *Deleted

## 2024-04-29 DIAGNOSIS — Z5181 Encounter for therapeutic drug level monitoring: Secondary | ICD-10-CM

## 2024-04-29 DIAGNOSIS — F4310 Post-traumatic stress disorder, unspecified: Secondary | ICD-10-CM

## 2024-04-29 DIAGNOSIS — F3160 Bipolar disorder, current episode mixed, unspecified: Secondary | ICD-10-CM

## 2024-04-29 DIAGNOSIS — G4700 Insomnia, unspecified: Secondary | ICD-10-CM

## 2024-04-29 MED ORDER — QUETIAPINE FUMARATE 25 MG PO TABS: 25.0000 mg | ORAL_TABLET | Freq: Two times a day (BID) | ORAL | 0 refills | Status: DC

## 2024-04-29 MED ORDER — QUETIAPINE FUMARATE 300 MG PO TABS: 300.0000 mg | ORAL_TABLET | Freq: Every day | ORAL | 0 refills | Status: DC

## 2024-04-29 NOTE — Telephone Encounter (Signed)
 She is taking lithium  moderate dose 4 and 50 mg 3 times a day.  Not sure about the compliance.  May need to repeat again after a week.  Please inform the patient that she need to take the medicine every day since levels are subtherapeutic.

## 2024-04-29 NOTE — Telephone Encounter (Signed)
 Will advise and will order repeat level.

## 2024-05-07 ENCOUNTER — Ambulatory Visit (INDEPENDENT_AMBULATORY_CARE_PROVIDER_SITE_OTHER): Payer: MEDICAID | Admitting: Clinical

## 2024-05-07 ENCOUNTER — Encounter (HOSPITAL_COMMUNITY): Payer: Self-pay | Admitting: Clinical

## 2024-05-07 DIAGNOSIS — F3160 Bipolar disorder, current episode mixed, unspecified: Secondary | ICD-10-CM

## 2024-05-07 DIAGNOSIS — F4310 Post-traumatic stress disorder, unspecified: Secondary | ICD-10-CM | POA: Diagnosis not present
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
## 2024-05-12 ENCOUNTER — Encounter (HOSPITAL_COMMUNITY): Payer: Self-pay | Admitting: Psychiatry

## 2024-05-12 ENCOUNTER — Telehealth (HOSPITAL_COMMUNITY): Payer: MEDICAID | Admitting: Psychiatry

## 2024-05-12 VITALS — Wt 132.0 lb

## 2024-05-12 DIAGNOSIS — F4310 Post-traumatic stress disorder, unspecified: Secondary | ICD-10-CM

## 2024-05-12 DIAGNOSIS — F3160 Bipolar disorder, current episode mixed, unspecified: Secondary | ICD-10-CM | POA: Diagnosis not present

## 2024-05-12 DIAGNOSIS — G4700 Insomnia, unspecified: Secondary | ICD-10-CM | POA: Diagnosis not present

## 2024-05-12 MED ORDER — LITHIUM CARBONATE ER 450 MG PO TBCR: 450.0000 mg | EXTENDED_RELEASE_TABLET | Freq: Three times a day (TID) | ORAL | 1 refills | Status: AC

## 2024-05-12 MED ORDER — QUETIAPINE FUMARATE 300 MG PO TABS: 300.0000 mg | ORAL_TABLET | Freq: Every day | ORAL | 1 refills | Status: AC

## 2024-05-12 MED ORDER — SERTRALINE HCL 25 MG PO TABS: 75.0000 mg | ORAL_TABLET | Freq: Every day | ORAL | 1 refills | Status: AC

## 2024-05-12 NOTE — Progress Notes (Signed)
 " [facility] MD Virtual Progress Note   Patient Location: Home Provider Location: Home Office  I connect with patient by video and verified that I am speaking with correct person by using two identifiers. I discussed the limitations of evaluation and management by telemedicine and the availability of in person appointments. I also discussed with the patient that there may be a patient responsible charge related to this service. The patient expressed understanding and agreed to proceed.  Bethany [name] [id number] 49 y.o.  05/12/2024 10:01 AM  History of Present Illness:  Patient is evaluated by video session.  She reported feeling very tired and fatigued.  She had visited twice emergency room in a month because of abdominal pain, rectal bleeding.  She was given the diagnosis of colitis.  She was given antibiotics, IV fluids and now she is scheduled to have colonoscopy and endoscopy.  She saw GI at [facility] physician and now following strict diet.  She lost more than 10 pounds in past 1 month.  Her last lithium  level was 0.1.  She believes at that time she was very sick and not eating well.  She was also given some antibiotic and pharmacist told not to take the lithium  due to interaction.  No she is back on lithium .  She denies any mania, psychosis, hallucination or any paranoia but tired and anxious about her general health.  She was happy to see all her children around Christmas.  She is taking Seroquel , lithium  and Zoloft .  She is no longer taking melatonin because sleep is better.  She is in therapy with Ms. Christine.  She denies any active or passive suicidal thoughts or homicidal thoughts.  Sometimes she struggle with nightmares and flashback but therapy helpful.  She had a good support from her parents.  Past Psychiatric History: Saw therapist in [facility] after brother killed.  Took Zoloft  and lorazepam  by PCP.  Took Lamictal  but stopped after the rash.  Trazodone  helped but  wanted to try something for anxiety and it was switched to hydroxyzine .  No h/o inpatient treatment, suicidal attempt, psychosis or any mania.  H/O irritability, anger and mood swing.     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   Left breast mass 06/30/2008   Low blood sugar 2001   pt checks BS in AM, controls with diet   Menorrhagia 05/09/2014   Need for immunization against influenza 01/12/2022   Routine cervical smear 06/20/2020   Routine health maintenance 05/09/2014   Routine screening for STI (sexually transmitted infection) 01/12/2022   S/P mastectomy, bilateral 10/27/2018   Surgery follow-up 10/19/2018   UTI (urinary tract infection) 11/01/2020   Vaginal Pap smear, abnormal     Outpatient Encounter Medications as of 05/12/2024  Medication Sig   doxycycline  (VIBRA -TABS) 100 MG tablet Take 1 tablet (100 mg total) by mouth 2 (two) times daily.   ondansetron  (ZOFRAN ) 4 MG tablet Take 1 tablet (4 mg total) by mouth every 8 (eight) hours as needed.  bisacodyl  (DULCOLAX) 5 MG EC tablet Take 5 mg by mouth daily.   carboxymethylcellulose (REFRESH PLUS) 0.5 % SOLN Place 1 drop into both eyes 2 (two) times daily as needed (dry eyes).   ciprofloxacin  (CIPRO ) 500 MG tablet Take 1 tablet (500 mg total) by mouth 2 (two) times daily. One po bid x 7 days   dicyclomine  (BENTYL ) 20 MG tablet Take 1 tablet (20 mg total) by mouth 2 (two) times daily.   linaclotide  (LINZESS ) 145 MCG CAPS capsule TAKE 1 CAPSULE BY MOUTH EVERY DAY BEFORE BREAKFAST - INSURANCE WILL PAY ON  6/25   lithium  carbonate (ESKALITH ) 450 MG ER tablet Take 1 tablet (450 mg total) by mouth 3 (three) times daily.   Melatonin 10 MG CAPS Take 10 mg by mouth at bedtime as needed. (Patient not taking: Reported on 05/12/2024)   metroNIDAZOLE  (FLAGYL ) 500 MG tablet Take 1 tablet (500 mg total) by mouth 2 (two) times daily.   ondansetron  (ZOFRAN ) 4 MG tablet Take 1 tablet (4 mg total) by mouth every 8 (eight) hours as needed for nausea or vomiting.   ondansetron  (ZOFRAN -ODT) 4 MG disintegrating tablet Take 1 tablet (4 mg total) by mouth every 8 (eight) hours as needed for nausea or vomiting.   pantoprazole  (PROTONIX ) 40 MG tablet TAKE 1 TABLET BY MOUTH EVERY DAY   promethazine  (PHENERGAN ) 12.5 MG tablet Take 1 tablet (12.5 mg total) by mouth every 6 (six) hours as needed for nausea or vomiting.   QUEtiapine  (SEROQUEL ) 25 MG tablet Take 1 tablet (25 mg total) by mouth 2 (two) times daily. (Patient not taking: Reported on 05/12/2024)   QUEtiapine  (SEROQUEL ) 300 MG tablet Take 1 tablet (300 mg total) by mouth at bedtime.   sertraline  (ZOLOFT ) 25 MG tablet Take 3 tablets (75 mg total) by mouth daily.   [DISCONTINUED] lithium  carbonate (ESKALITH ) 450 MG ER tablet Take 1 tablet (450 mg total) by mouth 3 (three) times daily.   [DISCONTINUED] QUEtiapine  (SEROQUEL ) 300 MG tablet Take 1 tablet (300 mg total) by mouth at bedtime.   [DISCONTINUED] sertraline  (ZOLOFT ) 25 MG tablet Take 3 tablets (75 mg total) by mouth daily.   No facility-administered encounter medications on file as of 05/12/2024.    Recent Results (from the past 2160 hours)  Lipase, blood     Status: None   Collection Time: 04/16/24 12:20 PM  Result Value Ref Range   Lipase 15 11 - 51 U/L    Comment: Performed at [facility], [address], [place], [place] [address]  Comprehensive metabolic panel     Status: None   Collection Time: 04/16/24 12:20 PM  Result Value Ref Range   Sodium 141 135 - 145 mmol/L   Potassium 3.6  3.5 - 5.1 mmol/L   Chloride 107 98 - 111 mmol/L   CO2 22 22 - 32 mmol/L   Glucose, Bld 95 70 - 99 mg/dL    Comment: Glucose reference range applies only to samples taken after fasting for at least 8 hours.   BUN 7 6 - 20 mg/dL   Creatinine, Ser 9.15 0.44 - 1.00 mg/dL   Calcium 9.3 8.9 - 89.6 mg/dL   Total Protein 7.6 6.5 - 8.1 g/dL   Albumin 4.6 3.5 - 5.0 g/dL   AST 19 15 - 41 U/L    Comment: HEMOLYSIS AT THIS LEVEL MAY AFFECT RESULT   ALT 11 0 - 44 U/L   Alkaline Phosphatase 59 38 - 126 U/L   Total Bilirubin  0.3 0.0 - 1.2 mg/dL   GFR, Estimated >39 >39 mL/min    Comment: (NOTE) Calculated using the CKD-EPI Creatinine Equation (2021)    Anion gap 13 5 - 15    Comment: Performed at [facility], [address] [place], [place] [address]  CBC     Status: None   Collection Time: 04/16/24 12:20 PM  Result Value Ref Range   WBC 5.1 4.0 - 10.5 K/uL    Comment: WHITE COUNT CONFIRMED ON SMEAR   RBC 4.68 3.87 - 5.11 MIL/uL   Hemoglobin 12.8 12.0 - 15.0 g/dL   HCT 59.4 63.9 - 53.9 %   MCV 86.5 80.0 - 100.0 fL   MCH 27.4 26.0 - 34.0 pg   MCHC 31.6 30.0 - 36.0 g/dL   RDW 85.1 88.4 - 84.4 %   Platelets 263 150 - 400 K/uL   nRBC 0.0 0.0 - 0.2 %    Comment: Performed at [facility], [address], [place], [place] [address]  Type and screen [facility]     Status: None   Collection Time: 04/16/24 12:20 PM  Result Value Ref Range   ABO/RH(D) O POS    Antibody Screen NEG    Sample Expiration      04/19/2024,2359 Performed at [facility], [address], [place], [place] [address]   Comprehensive metabolic panel     Status: Abnormal   Collection Time: 04/21/24  7:57 PM  Result Value Ref Range   Sodium 141 135 - 145 mmol/L   Potassium 3.1 (L) 3.5 - 5.1 mmol/L   Chloride 107 98 - 111 mmol/L   CO2 25 22 - 32 mmol/L   Glucose, Bld 96 70 - 99 mg/dL    Comment: Glucose reference range applies only to samples  taken after fasting for at least 8 hours.   BUN <5 (L) 6 - 20 mg/dL   Creatinine, Ser 9.16 0.44 - 1.00 mg/dL   Calcium 9.3 8.9 - 89.6 mg/dL   Total Protein 7.6 6.5 - 8.1 g/dL   Albumin 4.6 3.5 - 5.0 g/dL   AST 19 15 - 41 U/L   ALT 16 0 - 44 U/L   Alkaline Phosphatase 64 38 - 126 U/L   Total Bilirubin <0.2 0.0 - 1.2 mg/dL   GFR, Estimated >39 >39 mL/min    Comment: (NOTE) Calculated using the CKD-EPI Creatinine Equation (2021)    Anion gap 10 5 - 15    Comment: Performed at [facility], [address] [place], [place] [address]  Lipase, blood     Status: None   Collection Time: 04/21/24  7:57 PM  Result Value Ref Range   Lipase 15 11 - 51 U/L    Comment: Performed at [facility], [address] [place], [place] [address]  CBC with Differential     Status: None   Collection Time: 04/21/24  7:57 PM  Result Value Ref Range   WBC 7.6 4.0 - 10.5 K/uL    Comment: WHITE COUNT CONFIRMED ON SMEAR   RBC 4.54 3.87 - 5.11 MIL/uL   Hemoglobin 12.5 12.0 - 15.0 g/dL   HCT 60.5 63.9 - 53.9 %   MCV 86.8 80.0 - 100.0 fL   MCH 27.5 26.0 - 34.0 pg   MCHC 31.7 30.0 - 36.0 g/dL   RDW 85.0 88.4 - 84.4 %   Platelets 257 150 - 400 K/uL   nRBC 0.0  0.0 - 0.2 %   Neutrophils Relative % 61 %   Neutro Abs 4.6 1.7 - 7.7 K/uL   Lymphocytes Relative 33 %   Lymphs Abs 2.5 0.7 - 4.0 K/uL   Monocytes Relative 5 %   Monocytes Absolute 0.4 0.1 - 1.0 K/uL   Eosinophils Relative 1 %   Eosinophils Absolute 0.1 0.0 - 0.5 K/uL   Basophils Relative 0 %   Basophils Absolute 0.0 0.0 - 0.1 K/uL   WBC Morphology MORPHOLOGY UNREMARKABLE    RBC Morphology MORPHOLOGY UNREMARKABLE    Smear Review Normal platelet morphology    Immature Granulocytes 0 %   Abs Immature Granulocytes 0.01 0.00 - 0.07 K/uL    Comment: Performed at [facility], [address], [place], [place] [address]  Protime-INR     Status: None   Collection Time: 04/21/24  7:57 PM  Result  Value Ref Range   Prothrombin Time 13.0 11.4 - 15.2 seconds   INR 0.9 0.8 - 1.2    Comment: (NOTE) INR goal varies based on device and disease states. Performed at [facility], [address] [place], [place] [address]   Type and screen [facility]     Status: None   Collection Time: 04/21/24  7:57 PM  Result Value Ref Range   ABO/RH(D) O POS    Antibody Screen NEG    Sample Expiration      04/24/2024,2359 Performed at [facility], [address] [place], [place] [address]   Urinalysis, Routine w reflex microscopic -Urine, Clean Catch     Status: Abnormal   Collection Time: 04/21/24  8:10 PM  Result Value Ref Range   Color, Urine AMBER (A) YELLOW    Comment: BIOCHEMICALS MAY BE AFFECTED BY COLOR   APPearance HAZY (A) CLEAR   Specific Gravity, Urine 1.025 1.005 - 1.030   pH 5.0 5.0 - 8.0   Glucose, UA NEGATIVE NEGATIVE mg/dL   Hgb urine dipstick NEGATIVE NEGATIVE   Bilirubin Urine NEGATIVE NEGATIVE   Ketones, ur NEGATIVE NEGATIVE mg/dL   Protein, ur NEGATIVE NEGATIVE mg/dL   Nitrite NEGATIVE NEGATIVE   Leukocytes,Ua NEGATIVE NEGATIVE    Comment: Performed at [facility], [address] [place], [place] [address]  Lithium  level     Status: Abnormal   Collection Time: 04/26/24 11:38 AM  Result Value Ref Range   Lithium  Lvl <0.1 (L) 0.5 - 1.2 mmol/L    Comment: A concentration of 0.5-0.8 mmol/L is advised for long-term use; concentrations of up to 1.2 mmol/L may be necessary during acute treatment. **Verified by repeat analysis**                                  Detection Limit = 0.1                           <0.1 indicates None Detected      Psychiatric Specialty Exam: Physical Exam  Review of Systems  Constitutional:  Positive for fatigue.  Gastrointestinal:  Positive for abdominal pain.  Psychiatric/Behavioral:  Negative for agitation and suicidal ideas.     Weight 132 lb (59.9 kg),  last menstrual period 12/18/2015.Body mass index is 22.66 kg/m.  General Appearance: Casual  Eye Contact:  Good  Speech:  Slow  Volume:  Decreased  Mood:  Dysphoric and tired  Affect:  Appropriate  Thought Process:  Goal Directed  Orientation:  Full (Time, Place, and Person)  Thought Content:  WDL  Suicidal Thoughts:  No  Homicidal Thoughts:  No  Memory:  Immediate;   Good Recent;   Good Remote;   Fair  Judgement:  Intact  Insight:  Good  Psychomotor Activity:  Normal  Concentration:  Concentration: Fair and Attention Span: Fair  Recall:  Good  Fund of Knowledge:  Good  Language:  Good  Akathisia:  No  Handed:  Right  AIMS (if indicated):     Assets:  Communication Skills Desire for Improvement Housing Social Support  ADL's:  Intact  Cognition:  WNL  Sleep:  too much       03/09/2024   10:30 AM 10/10/2023   11:37 AM 09/08/2023    9:45 AM 08/21/2023    1:34 PM 03/05/2023    8:39 AM  Depression screen PHQ 2/9  Decreased Interest 1 0 1 0 0  Down, Depressed, Hopeless 1 0 0 2 2  PHQ - 2 Score 2 0 1 2 2   Altered sleeping 2 0 0 3 3  Tired, decreased energy 1 0 0 2 2  Change in appetite 0 0 0 1 3  Feeling bad or failure about yourself  0 0 0 0 0  Trouble concentrating 2 0 2 0 3  Moving slowly or fidgety/restless 1 0 3 3 2   Suicidal thoughts 0 0 0 0 0  PHQ-9 Score 8 0  6  11  15    Difficult doing work/chores   Somewhat difficult       Data saved with a previous flowsheet row definition    Assessment/Plan: Mixed bipolar I disorder (HCC) - Plan: lithium  carbonate (ESKALITH ) 450 MG ER tablet, QUEtiapine  (SEROQUEL ) 300 MG tablet, sertraline  (ZOLOFT ) 25 MG tablet  Insomnia, unspecified type - Plan: QUEtiapine  (SEROQUEL ) 300 MG tablet, sertraline  (ZOLOFT ) 25 MG tablet  Post-traumatic stress disorder, unspecified - Plan: sertraline  (ZOLOFT ) 25 MG tablet  Patient is a 49 year old female with bipolar disorder, insomnia, PTSD, migraine, chronic back pain, hypothyroidism.   Reviewed blood work results and ER visit summary.  Her potassium is very low.  Now she is scheduled for colonoscopy and endoscopy.  She has a rectal bleed and she feels very tired.  Reviewed blood work results especially lithium  level which is very low.  Her potassium is also very low.  It is unclear if she is taking the lithium  as prescribed or not enough absorption.  Her main concern is fatigue and tired.  Recommend not to take the Seroquel  25 mg during the day but keep the Seroquel  200 mg at bedtime along with Zoloft  75 mg daily.  She endorsed that taking the lithium  as prescribed and will keep the lithium  450 mg 3 times a day.  We will do blood work of lithium  and comprehensive metabolic panel.  Will follow-up in a month when she has her endoscopy and colonoscopy.  Encouraged to keep appointment with Ms. Christine.  Follow-up in 4 weeks.  Discussed medication side effects as most of the medication may make her tired and sleepy so reducing the Seroquel  may help.  She is also no longer taking melatonin.   Follow Up Instructions:     I discussed the assessment and treatment plan with the patient. The patient was provided an opportunity to ask questions and all were answered. The patient agreed with the plan and demonstrated an understanding of the instructions.   The patient was  advised to call back or seek an in-person evaluation if the symptoms worsen or if the condition fails to improve as anticipated.    Collaboration of Care: Other provider involved in patient's care AEB notes are available in epic to review  Patient/Guardian was advised Release of Information must be obtained prior to any record release in order to collaborate their care with an outside provider. Patient/Guardian was advised if they have not already done so to contact the registration department to sign all necessary forms in order for us  to release information regarding their care.   Consent: Patient/Guardian gives verbal  consent for treatment and assignment of benefits for services provided during this visit. Patient/Guardian expressed understanding and agreed to proceed.     Total encounter time 30 minutes which includes face-to-face time, medically appropriate examination, reviewing and interpreting the results, chart reviewed, care coordination, order entry and documentation during this encounter.   Note: This document was prepared by [employer] voice dictation technology and any errors that results from this process are unintentional.    Leni ONEIDA Client, MD 05/12/2024   "

## 2024-05-13 ENCOUNTER — Ambulatory Visit (HOSPITAL_BASED_OUTPATIENT_CLINIC_OR_DEPARTMENT_OTHER): Payer: MEDICAID

## 2024-05-13 DIAGNOSIS — Z79899 Other long term (current) drug therapy: Secondary | ICD-10-CM | POA: Diagnosis not present

## 2024-05-13 NOTE — Addendum Note (Signed)
 Addended by: Ernesha Ramone E on: 05/13/2024 10:26 AM   Modules accepted: Orders

## 2024-05-13 NOTE — Progress Notes (Signed)
 Patient arrived today for her due lithium  level. Patient was pleasant and well groomed. I drew a tiger top in the right AC using a 22 G straight needle. Patient tolerated well and without complaint

## 2024-05-14 ENCOUNTER — Telehealth (HOSPITAL_COMMUNITY): Payer: Self-pay | Admitting: *Deleted

## 2024-05-14 LAB — LITHIUM LEVEL: Lithium Lvl: 1.2 mmol/L (ref 0.5–1.2)

## 2024-05-14 NOTE — Telephone Encounter (Signed)
 Thanks for the update.  If no side effects then we will continue same dose.

## 2024-05-14 NOTE — Telephone Encounter (Signed)
 Lithium  Level drawn on 05/13/24 results received from [facility].  Lithium  Level: 1.2 mmol/L

## 2024-05-18 ENCOUNTER — Ambulatory Visit (INDEPENDENT_AMBULATORY_CARE_PROVIDER_SITE_OTHER): Payer: MEDICAID | Admitting: Podiatry

## 2024-05-18 VITALS — Ht 64.0 in | Wt 132.0 lb

## 2024-05-18 DIAGNOSIS — M2012 Hallux valgus (acquired), left foot: Secondary | ICD-10-CM

## 2024-05-18 DIAGNOSIS — M2042 Other hammer toe(s) (acquired), left foot: Secondary | ICD-10-CM

## 2024-05-18 DIAGNOSIS — M21622 Bunionette of left foot: Secondary | ICD-10-CM | POA: Diagnosis not present

## 2024-05-18 NOTE — Patient Instructions (Signed)

## 2024-05-18 NOTE — Progress Notes (Signed)
"  °  Subjective:  Patient ID: Bethany [name], female    DOB: Oct 01, 1975,  MRN: [id number]  Chief Complaint  Patient presents with   Foot Problem    Rm 1 Patient is here for surgical consultation.    49 y.o. female presents with the above complaint. History confirmed with patient.  She previous underwent right hallux valgus correction with Dr. Verta, she was scheduled to undergo this as well as left fifth toe correction previously with him but due to cost considerations at the surgery center was not able to proceed with this.  She is consulting with me today for surgery at [facility].  She has pain over the bunion and great toe as well as the fifth toe and now having pain as well and the bump behind the fifth toe.  She has utilized wider shoes, silicone padding and and NSAIDs without relief  Objective:  Physical Exam: warm, good capillary refill, no trophic changes or ulcerative lesions, normal DP and PT pulses, normal sensory exam, and hallux valgus deformity with tenderness over the dorsal medial eminence, good range of motion of the MTP joint that is pain-free she has a tailor's bunion deformity and adductovarus fifth toe with a painful corn laterally.   Radiographs: Multiple views x-ray of the left foot: Prior left foot radiographs taken Sep 18, 2023 shows moderate to severe hallux valgus 20 the increase in her metatarsal angle increase hallux abductus angle increased sesamoid deviation as well as tailor's bunion deformity and fifth toe adductovarus contracture. Assessment:     ICD-10-CM   1. 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 Hammer toe of left foot  M20.42     3. Tailor's bunion of left foot  M21.622         Plan:  Patient was evaluated and treated and all questions answered.  Discussed the etiology and treatment including surgical and non surgical treatment for painful bunions and tailor's bunion and left fifth hammertoes. She  has exhausted all non surgical treatment  prior to this visit including shoe gear changes and padding. She desires surgical intervention. We discussed all risks including but not limited to: pain, swelling, infection, scar, numbness which may be temporary or permanent, chronic pain, stiffness, nerve pain or damage, wound healing problems, bone healing problems including delayed or non-union and recurrence. Specifically we discussed the following procedures: Left foot bunionectomy with double osteotomy, tailor's bunion correction with osteotomy and hammertoe correction with arthroplasty. Informed consent was signed today. Surgery will be scheduled at a mutually agreeable date. Information regarding this will be forwarded to our surgery scheduler. In the interim until surgery I recommended utilizing as wide of shoes as possible, take NSAIDs or tylenol  as tolerated for pain, and a bunion padding shield which can be purchased online.  She did not like the block she had last time and would like to go without a regional block    Surgical plan:  Procedure: - Left foot bunionectomy with double osteotomy, tailor's bunion correction with osteotomy and hammertoe correction with arthroplasty  Location: - [facility]  Anesthesia plan: - General With local Exparel  block  Postoperative pain plan: - Vicodin 5 mg / 325 mg, ibuprofen  600 mg every 6 hours as needed  DVT prophylaxis: - ASA 81 mg twice daily  WB Restrictions / DME needs: - Weightbearing as tolerated in cam walker boot postop  No follow-ups on file.   "

## 2024-05-20 ENCOUNTER — Encounter: Payer: MEDICAID | Admitting: Podiatry

## 2024-05-21 ENCOUNTER — Ambulatory Visit: Payer: MEDICAID | Admitting: Family Medicine

## 2024-05-21 ENCOUNTER — Encounter: Payer: Self-pay | Admitting: Family Medicine

## 2024-05-21 ENCOUNTER — Ambulatory Visit: Payer: Self-pay | Admitting: Family Medicine

## 2024-05-21 VITALS — BP 108/75 | HR 64 | Ht 64.0 in | Wt 140.0 lb

## 2024-05-21 DIAGNOSIS — M21611 Bunion of right foot: Secondary | ICD-10-CM | POA: Diagnosis not present

## 2024-05-21 DIAGNOSIS — K625 Hemorrhage of anus and rectum: Secondary | ICD-10-CM | POA: Diagnosis not present

## 2024-05-21 DIAGNOSIS — E0590 Thyrotoxicosis, unspecified without thyrotoxic crisis or storm: Secondary | ICD-10-CM | POA: Diagnosis not present

## 2024-05-21 DIAGNOSIS — F4310 Post-traumatic stress disorder, unspecified: Secondary | ICD-10-CM

## 2024-05-21 DIAGNOSIS — R7309 Other abnormal glucose: Secondary | ICD-10-CM

## 2024-05-21 DIAGNOSIS — E876 Hypokalemia: Secondary | ICD-10-CM

## 2024-05-21 DIAGNOSIS — G4700 Insomnia, unspecified: Secondary | ICD-10-CM | POA: Diagnosis not present

## 2024-05-21 DIAGNOSIS — F3160 Bipolar disorder, current episode mixed, unspecified: Secondary | ICD-10-CM | POA: Diagnosis not present

## 2024-05-21 DIAGNOSIS — Z1159 Encounter for screening for other viral diseases: Secondary | ICD-10-CM | POA: Diagnosis not present

## 2024-05-21 DIAGNOSIS — F319 Bipolar disorder, unspecified: Secondary | ICD-10-CM | POA: Diagnosis not present

## 2024-05-21 DIAGNOSIS — E7800 Pure hypercholesterolemia, unspecified: Secondary | ICD-10-CM | POA: Diagnosis not present

## 2024-05-21 LAB — POCT GLYCOSYLATED HEMOGLOBIN (HGB A1C): Hemoglobin A1C: 5.8 % — AB (ref 4.0–5.6)

## 2024-05-21 MED ORDER — QUETIAPINE FUMARATE 25 MG PO TABS: 25.0000 mg | ORAL_TABLET | Freq: Every day | ORAL | Status: AC

## 2024-05-21 NOTE — Assessment & Plan Note (Addendum)
 Continue lithium , seroquel , and zoloft  per psychiatry. No SI today. PHQ-9 = 17.

## 2024-05-21 NOTE — Assessment & Plan Note (Signed)
 Recheck TSH today with hx of lithium  use.

## 2024-05-21 NOTE — Patient Instructions (Signed)
 VISIT SUMMARY: Today, we discussed your recent rectal bleeding and weight loss, and you are scheduled for a colonoscopy and endoscopy next week. We also reviewed your management plan for bipolar disorder, insomnia, subclinical hyperthyroidism, elevated cholesterol, low potassium, and abnormal glucose metabolism.  YOUR PLAN: RECTAL BLEEDING: You experienced rectal bleeding recently, likely due to dietary factors, and have a family history of ulcerative colitis. -Proceed with the scheduled colonoscopy at [facility] on Wednesday. -Continue following the special diet as prescribed by your GI specialist.  MIXED BIPOLAR I DISORDER: Your bipolar disorder is managed with lithium  and Zoloft . You have noted anxiety related to upcoming procedures. -Continue taking lithium  450 mg three times a day. -Continue taking Zoloft  75 mg daily. -Monitor your anxiety levels, especially around the time of your procedures. -Be sure to follow up with your psychiatrist soon.  INSOMNIA: You have a history of insomnia, previously managed with melatonin, but are currently not using sleep aids. -Continue managing your insomnia without sleep aids.  SUBCLINICAL HYPERTHYROIDISM: Your thyroid function is being monitored due to your use of lithium . -Thyroid function tests have been ordered.  ELEVATED LDL CHOLESTEROL: Your cholesterol levels are being monitored. -A cholesterol panel has been ordered.  HYPOKALEMIA: Your potassium levels are being monitored. -A potassium level test has been ordered.  ABNORMAL GLUCOSE METABOLISM: Your blood glucose levels are being monitored. -A blood glucose test has been ordered.  GENERAL HEALTH MAINTENANCE: Routine health maintenance including screenings and vaccinations. Your hepatitis B immunity status needs to be confirmed. -A hepatitis B immunity test has been ordered.  Please let me know if you have any other questions.  Dr. Tharon

## 2024-05-21 NOTE — Assessment & Plan Note (Signed)
 Recheck today given hx of periodic paralysis. Reassuring exam today.

## 2024-05-21 NOTE — Progress Notes (Signed)
" ° ° °  SUBJECTIVE:   Chief compliant/HPI: annual examination  Bethany [name] is a 49 y.o. who presents today for an annual exam.   Discussed the use of AI scribe software for clinical note transcription with the patient, who gave verbal consent to proceed.  History of Present Illness Bethany [name] is a 49 year old female who presents for a physical and with previous concerns of blood in stool and weight loss.  Rectal bleeding - Rectal bleeding began shortly after Christmas - Severity required a hospital visit - No further rectal bleeding since the initial episode - Was told she had colitis and was given antibiotics - Has GI follow up at [facility] for colonoscopy and endoscopy soon  Neuropsychiatric symptoms - Episodes of shaking and feeling nervous - Symptoms cause social concern - Continues to take her lithium  and zoloft  and seroquel  - No SI today   History tabs reviewed and updated.   OBJECTIVE:   BP 108/75   Pulse 64   Ht 5' 4 (1.626 m)   Wt 140 lb (63.5 kg)   LMP 12/18/2015   SpO2 100%   BMI 24.03 kg/m   General: Alert and oriented, in NAD Skin: Warm, dry, and intact HEENT: NCAT, EOM grossly normal, midline nasal septum Cardiac: RRR, no m/r/g appreciated Respiratory: CTAB, breathing and speaking comfortably on RA Abdominal: Soft, nontender, nondistended, normoactive bowel sounds Extremities: Moves all extremities grossly equally Neurological: No gross focal deficit Psychiatric: Appropriate mood and affect   ASSESSMENT/PLAN:   Assessment & Plan Subclinical hyperthyroidism Recheck TSH today with hx of lithium  use. Hypokalemia Recheck today given hx of periodic paralysis. Reassuring exam today. Bright red blood per rectum Resolved. Has follow up with GI soon for scope. Need for hepatitis B screening test Ordered given no evidence of hep B immunity on NCIR. Mixed bipolar I disorder (HCC) Bipolar 1 disorder (HCC) Post-traumatic stress disorder,  unspecified Continue lithium , seroquel , and zoloft  per psychiatry. No SI today. PHQ-9 = 17. Bunion of great toe of right foot Scheduled for bunionectomy with general anesthesia. She has tolerated surgeries well in the past. No significant comorbid conditions. Low risk for a low risk surgery. She is optimized.   Annual Examination  See AVS for age appropriate recommendations  BP reviewed and at goal.   Considered the following items based upon USPSTF recommendations: Diabetes screening: ordered HIV testing:discussed and declined Hepatitis C: discussed and declined Hepatitis B:discussed and declined Syphilis if at high risk: discussed and declined GC/CT not at high risk and not ordered. Lipid panel (nonfasting or fasting) discussed based upon AHA recommendations and ordered.   Cancer Screening Discussion  Cervical cancer screening: prior Pap reviewed, repeat due in 2027 Breast cancer screening: not indicated given total bilateral mastectomy. Lung cancer screening:not indicated as does not meet criteria.  Colorectal cancer screening: reportedly UTD since 2023 though I never received records; she is having her diagnostic colonoscopy with GI soon in 2026 and will need records faxed to us  after this.  Vaccinations: updated in chart.   Follow up in 1 year or sooner if indicated.  MyChart Activation: Already signed up  Stuart Redo, MD [facility] "

## 2024-05-22 LAB — BASIC METABOLIC PANEL WITH GFR
BUN/Creatinine Ratio: 4 — ABNORMAL LOW (ref 9–23)
BUN: 4 mg/dL — ABNORMAL LOW (ref 6–24)
CO2: 25 mmol/L (ref 20–29)
Calcium: 9.8 mg/dL (ref 8.7–10.2)
Chloride: 100 mmol/L (ref 96–106)
Creatinine, Ser: 0.96 mg/dL (ref 0.57–1.00)
Glucose: 103 mg/dL — ABNORMAL HIGH (ref 70–99)
Potassium: 4.4 mmol/L (ref 3.5–5.2)
Sodium: 140 mmol/L (ref 134–144)
eGFR: 72 mL/min/{1.73_m2}

## 2024-05-22 LAB — LIPID PANEL
Chol/HDL Ratio: 3.5 ratio (ref 0.0–4.4)
Cholesterol, Total: 200 mg/dL — ABNORMAL HIGH (ref 100–199)
HDL: 57 mg/dL
LDL Chol Calc (NIH): 117 mg/dL — ABNORMAL HIGH (ref 0–99)
Triglycerides: 146 mg/dL (ref 0–149)
VLDL Cholesterol Cal: 26 mg/dL (ref 5–40)

## 2024-05-22 LAB — CBC WITH DIFFERENTIAL/PLATELET
Basophils Absolute: 0 10*3/uL (ref 0.0–0.2)
Basos: 0 %
EOS (ABSOLUTE): 0.1 10*3/uL (ref 0.0–0.4)
Eos: 2 %
Hematocrit: 41.1 % (ref 34.0–46.6)
Hemoglobin: 13.1 g/dL (ref 11.1–15.9)
Immature Grans (Abs): 0 10*3/uL (ref 0.0–0.1)
Immature Granulocytes: 0 %
Lymphocytes Absolute: 1.7 10*3/uL (ref 0.7–3.1)
Lymphs: 25 %
MCH: 27.3 pg (ref 26.6–33.0)
MCHC: 31.9 g/dL (ref 31.5–35.7)
MCV: 86 fL (ref 79–97)
Monocytes Absolute: 0.4 10*3/uL (ref 0.1–0.9)
Monocytes: 6 %
Neutrophils Absolute: 4.4 10*3/uL (ref 1.4–7.0)
Neutrophils: 67 %
Platelets: 267 10*3/uL (ref 150–450)
RBC: 4.79 x10E6/uL (ref 3.77–5.28)
RDW: 14.1 % (ref 11.7–15.4)
WBC: 6.6 10*3/uL (ref 3.4–10.8)

## 2024-05-22 LAB — HEPATITIS B SURFACE ANTIBODY,QUALITATIVE: Hep B Surface Ab, Qual: REACTIVE

## 2024-05-22 LAB — TSH RFX ON ABNORMAL TO FREE T4: TSH: 0.862 u[IU]/mL (ref 0.450–4.500)

## 2024-05-25 ENCOUNTER — Ambulatory Visit: Payer: MEDICAID | Admitting: Plastic Surgery

## 2024-05-27 ENCOUNTER — Encounter: Payer: Self-pay | Admitting: Family Medicine

## 2024-05-27 ENCOUNTER — Other Ambulatory Visit: Payer: Self-pay | Admitting: Family Medicine

## 2024-05-27 DIAGNOSIS — Z1159 Encounter for screening for other viral diseases: Secondary | ICD-10-CM

## 2024-06-08 ENCOUNTER — Ambulatory Visit (HOSPITAL_COMMUNITY): Payer: MEDICAID | Admitting: Clinical

## 2024-06-09 ENCOUNTER — Telehealth (HOSPITAL_COMMUNITY): Payer: MEDICAID | Admitting: Psychiatry

## 2024-06-25 ENCOUNTER — Ambulatory Visit: Payer: MEDICAID | Admitting: Plastic Surgery

## 2024-06-29 ENCOUNTER — Ambulatory Visit (HOSPITAL_COMMUNITY): Payer: MEDICAID | Admitting: Clinical

## 2024-07-09 ENCOUNTER — Ambulatory Visit: Admit: 2024-07-09 | Payer: MEDICAID | Admitting: Podiatry

## 2024-07-15 ENCOUNTER — Encounter: Payer: MEDICAID | Admitting: Podiatry

## 2024-07-23 ENCOUNTER — Ambulatory Visit (HOSPITAL_COMMUNITY): Payer: MEDICAID | Admitting: Clinical

## 2024-07-29 ENCOUNTER — Encounter: Payer: MEDICAID | Admitting: Podiatry

## 2024-08-19 ENCOUNTER — Encounter: Payer: MEDICAID | Admitting: Podiatry

## 2024-08-20 ENCOUNTER — Ambulatory Visit (HOSPITAL_COMMUNITY): Payer: MEDICAID | Admitting: Clinical

## 2024-09-17 ENCOUNTER — Ambulatory Visit (HOSPITAL_COMMUNITY): Payer: MEDICAID | Admitting: Clinical
